# Patient Record
Sex: Female | Born: 1940 | Race: Black or African American | Hispanic: No | Smoking: Former smoker
Health system: Southern US, Community
[De-identification: ages and names within clinical notes are randomized; demographics above are authoritative.]

## PROBLEM LIST (undated history)

## (undated) DIAGNOSIS — J439 Emphysema, unspecified: Secondary | ICD-10-CM

## (undated) DIAGNOSIS — M1990 Unspecified osteoarthritis, unspecified site: Secondary | ICD-10-CM

## (undated) DIAGNOSIS — R351 Nocturia: Secondary | ICD-10-CM

## (undated) DIAGNOSIS — M810 Age-related osteoporosis without current pathological fracture: Secondary | ICD-10-CM

## (undated) DIAGNOSIS — I10 Essential (primary) hypertension: Secondary | ICD-10-CM

## (undated) DIAGNOSIS — D4959 Neoplasm of unspecified behavior of other genitourinary organ: Secondary | ICD-10-CM

## (undated) DIAGNOSIS — E119 Type 2 diabetes mellitus without complications: Secondary | ICD-10-CM

## (undated) DIAGNOSIS — E0590 Thyrotoxicosis, unspecified without thyrotoxic crisis or storm: Secondary | ICD-10-CM

## (undated) DIAGNOSIS — R0609 Other forms of dyspnea: Secondary | ICD-10-CM

## (undated) DIAGNOSIS — I639 Cerebral infarction, unspecified: Secondary | ICD-10-CM

## (undated) DIAGNOSIS — D494 Neoplasm of unspecified behavior of bladder: Secondary | ICD-10-CM

## (undated) DIAGNOSIS — I509 Heart failure, unspecified: Secondary | ICD-10-CM

## (undated) DIAGNOSIS — G4734 Idiopathic sleep related nonobstructive alveolar hypoventilation: Secondary | ICD-10-CM

## (undated) DIAGNOSIS — Z8709 Personal history of other diseases of the respiratory system: Secondary | ICD-10-CM

## (undated) DIAGNOSIS — C801 Malignant (primary) neoplasm, unspecified: Secondary | ICD-10-CM

## (undated) DIAGNOSIS — H269 Unspecified cataract: Secondary | ICD-10-CM

## (undated) HISTORY — DX: Malignant (primary) neoplasm, unspecified: C80.1

## (undated) HISTORY — DX: Cerebral infarction, unspecified: I63.9

## (undated) HISTORY — DX: Age-related osteoporosis without current pathological fracture: M81.0

## (undated) HISTORY — DX: Unspecified cataract: H26.9

---

## 1979-05-01 HISTORY — PX: ABDOMINAL HYSTERECTOMY: SHX81

## 2011-06-01 LAB — HM DEXA SCAN

## 2011-07-06 ENCOUNTER — Other Ambulatory Visit: Payer: Self-pay | Admitting: Family Medicine

## 2011-07-06 DIAGNOSIS — R921 Mammographic calcification found on diagnostic imaging of breast: Secondary | ICD-10-CM

## 2011-07-29 ENCOUNTER — Ambulatory Visit
Admission: RE | Admit: 2011-07-29 | Discharge: 2011-07-29 | Disposition: A | Discharge status: Home / Self Care | Payer: No Typology Code available for payment source | Source: Ambulatory Visit | Attending: Family Medicine | Admitting: Family Medicine

## 2011-07-29 DIAGNOSIS — R921 Mammographic calcification found on diagnostic imaging of breast: Secondary | ICD-10-CM

## 2012-01-04 ENCOUNTER — Other Ambulatory Visit: Payer: Self-pay | Admitting: Family Medicine

## 2012-01-04 DIAGNOSIS — R921 Mammographic calcification found on diagnostic imaging of breast: Secondary | ICD-10-CM

## 2012-02-09 ENCOUNTER — Ambulatory Visit
Admission: RE | Admit: 2012-02-09 | Discharge: 2012-02-09 | Disposition: A | Discharge status: Home / Self Care | Payer: No Typology Code available for payment source | Source: Ambulatory Visit | Attending: Family Medicine | Admitting: Family Medicine

## 2012-02-09 DIAGNOSIS — R921 Mammographic calcification found on diagnostic imaging of breast: Secondary | ICD-10-CM

## 2012-02-23 ENCOUNTER — Encounter (HOSPITAL_COMMUNITY): Payer: Self-pay | Admitting: Emergency Medicine

## 2012-02-23 ENCOUNTER — Emergency Department (HOSPITAL_COMMUNITY): Payer: Medicare Other

## 2012-02-23 ENCOUNTER — Inpatient Hospital Stay (HOSPITAL_COMMUNITY)
Admission: EM | Admit: 2012-02-23 | Discharge: 2012-02-24 | DRG: 699 | Disposition: A | Discharge status: Home Health | Payer: Medicare Other | Attending: Internal Medicine | Admitting: Internal Medicine

## 2012-02-23 DIAGNOSIS — N3289 Other specified disorders of bladder: Secondary | ICD-10-CM

## 2012-02-23 DIAGNOSIS — D62 Acute posthemorrhagic anemia: Secondary | ICD-10-CM

## 2012-02-23 DIAGNOSIS — R310 Gross hematuria: Secondary | ICD-10-CM | POA: Diagnosis present

## 2012-02-23 DIAGNOSIS — R319 Hematuria, unspecified: Secondary | ICD-10-CM

## 2012-02-23 DIAGNOSIS — I951 Orthostatic hypotension: Secondary | ICD-10-CM

## 2012-02-23 DIAGNOSIS — D649 Anemia, unspecified: Secondary | ICD-10-CM

## 2012-02-23 DIAGNOSIS — N329 Bladder disorder, unspecified: Principal | ICD-10-CM | POA: Diagnosis present

## 2012-02-23 DIAGNOSIS — E0590 Thyrotoxicosis, unspecified without thyrotoxic crisis or storm: Secondary | ICD-10-CM

## 2012-02-23 HISTORY — DX: Essential (primary) hypertension: I10

## 2012-02-23 LAB — POCT I-STAT, CHEM 8
Creatinine, Ser: 0.9 mg/dL (ref 0.50–1.10)
HCT: 25 % — ABNORMAL LOW (ref 36.0–46.0)
Hemoglobin: 8.5 g/dL — ABNORMAL LOW (ref 12.0–15.0)
Potassium: 3.7 mEq/L (ref 3.5–5.1)
Sodium: 138 mEq/L (ref 135–145)
TCO2: 26 mmol/L (ref 0–100)

## 2012-02-23 LAB — CBC WITH DIFFERENTIAL/PLATELET
Basophils Absolute: 0 10*3/uL (ref 0.0–0.1)
Basophils Relative: 0 % (ref 0–1)
HCT: 24.8 % — ABNORMAL LOW (ref 36.0–46.0)
Hemoglobin: 7.8 g/dL — ABNORMAL LOW (ref 12.0–15.0)
Lymphocytes Relative: 16 % (ref 12–46)
MCHC: 31.5 g/dL (ref 30.0–36.0)
Monocytes Absolute: 0.9 10*3/uL (ref 0.1–1.0)
Monocytes Relative: 12 % (ref 3–12)
Neutro Abs: 5.7 10*3/uL (ref 1.7–7.7)
Neutrophils Relative %: 72 % (ref 43–77)
RDW: 15.6 % — ABNORMAL HIGH (ref 11.5–15.5)
WBC: 8 10*3/uL (ref 4.0–10.5)

## 2012-02-23 LAB — URINALYSIS, ROUTINE W REFLEX MICROSCOPIC
Bilirubin Urine: NEGATIVE
Nitrite: NEGATIVE
Urobilinogen, UA: 1 mg/dL (ref 0.0–1.0)

## 2012-02-23 LAB — RETICULOCYTES: RBC.: 2.86 MIL/uL — ABNORMAL LOW (ref 3.87–5.11)

## 2012-02-23 LAB — PROCALCITONIN: Procalcitonin: <0.1 ng/mL

## 2012-02-23 LAB — URINE MICROSCOPIC-ADD ON

## 2012-02-23 LAB — ABO/RH: ABO/RH(D): A NEG

## 2012-02-23 MED ORDER — DOCUSATE SODIUM 100 MG PO CAPS
100.0000 mg | ORAL_CAPSULE | Freq: Two times a day (BID) | ORAL | Status: DC
  Administered 2012-02-24 (×2): 100 mg via ORAL
  Filled 2012-02-23 (×3): qty 1

## 2012-02-23 MED ORDER — SODIUM CHLORIDE 0.9 % IV BOLUS (SEPSIS)
250.0000 mL | Freq: Once | INTRAVENOUS | Status: AC
  Administered 2012-02-23: 250 mL via INTRAVENOUS

## 2012-02-23 MED ORDER — DEXTROSE 5 % IV SOLN
1.0000 g | Freq: Once | INTRAVENOUS | Status: AC
  Administered 2012-02-23: 1 g via INTRAVENOUS
  Filled 2012-02-23: qty 10

## 2012-02-23 MED ORDER — POLYETHYLENE GLYCOL 3350 17 G PO PACK
17.0000 g | PACK | Freq: Every day | ORAL | Status: DC | PRN
  Filled 2012-02-23: qty 1

## 2012-02-23 MED ORDER — ACETAMINOPHEN 650 MG RE SUPP: 650.0000 mg | Freq: Four times a day (QID) | RECTAL | Status: DC | PRN

## 2012-02-23 MED ORDER — ONDANSETRON HCL 4 MG/2ML IJ SOLN: 4.0000 mg | Freq: Four times a day (QID) | INTRAMUSCULAR | Status: DC | PRN

## 2012-02-23 MED ORDER — SODIUM CHLORIDE 0.9 % IV SOLN
INTRAVENOUS | Status: DC
  Administered 2012-02-23: 19:00:00 via INTRAVENOUS
  Administered 2012-02-23: 1000 mL via INTRAVENOUS

## 2012-02-23 MED ORDER — ACETAMINOPHEN 325 MG PO TABS: 650.0000 mg | ORAL_TABLET | Freq: Four times a day (QID) | ORAL | Status: DC | PRN

## 2012-02-23 MED ORDER — METHIMAZOLE 10 MG PO TABS
10.0000 mg | ORAL_TABLET | Freq: Three times a day (TID) | ORAL | Status: DC
  Administered 2012-02-24 (×3): 10 mg via ORAL
  Filled 2012-02-23 (×4): qty 1

## 2012-02-23 MED ORDER — ONDANSETRON HCL 4 MG PO TABS: 4.0000 mg | ORAL_TABLET | Freq: Four times a day (QID) | ORAL | Status: DC | PRN

## 2012-02-23 NOTE — ED Notes (Signed)
Attempted to call report to 3 E. RN unable to take report at this time. Will call back.  

## 2012-02-23 NOTE — ED Notes (Signed)
AVW:[id number]> Expected date:<BR> Expected time:<BR> Means of arrival:<BR> Comments:<BR> RC ems- flank pain

## 2012-02-23 NOTE — ED Notes (Signed)
Lab tech at bedside for additional blood draw.  

## 2012-02-23 NOTE — ED Provider Notes (Signed)
History     CSN: [id number]  Arrival date & time 02/23/12  1725   First MD Initiated Contact with Patient 02/23/12 1749      Chief Complaint  Patient presents with  . Flank Pain    HPI Pt was seen at 1820.  Per pt, c/o gradual onset and persistence of constant hematuria for the past 1 to 2 weeks.  Pt states she was eval at OSH 5 days ago for hematuria and urinary retention, had a foley placed, dx UTI, rx macrobid and referred to Uro MD in [place].  States she cannot get an appt soon and her symptoms continue.  Have now been associated with "lightheadedness" when standing and left sided LBP.  Denies fevers, no N/V/D, no CP/SOB, no abd pain. Denies incont of bowel, no saddle anesthesia, no focal motor weakness, no tingling/numbness in extremities.     PMD:  Ignacia Bayley FP Past Medical History  Diagnosis Date  . Hypertension   . Thyroid disease     No past surgical history on file.   History  Substance Use Topics  . Smoking status: Not on file  . Smokeless tobacco: Not on file  . Alcohol Use:     Review of Systems ROS: Statement: All systems negative except as marked or noted in the HPI; Constitutional: Negative for fever and chills. ; ; Eyes: Negative for eye pain, redness and discharge. ; ; ENMT: Negative for ear pain, hoarseness, nasal congestion, sinus pressure and sore throat. ; ; Cardiovascular: Negative for chest pain, palpitations, diaphoresis, dyspnea and peripheral edema. ; ; Respiratory: Negative for cough, wheezing and stridor. ; ; Gastrointestinal: Negative for nausea, vomiting, diarrhea, abdominal pain, blood in stool, hematemesis, jaundice and rectal bleeding. . ; ; Genitourinary: +hematuria, urinary retention. Negative for dysuria, flank pain and hematuria. ; ; Musculoskeletal: +LBP. Negative for neck pain. Negative for swelling and trauma.; ; Skin: Negative for pruritus, rash, abrasions, blisters, bruising and skin lesion.; ; Neuro: +lightheadedness.  Negative for headache and neck stiffness. Negative for weakness, altered level of consciousness , altered mental status, extremity weakness, paresthesias, involuntary movement, seizure and syncope.      Allergies  Review of patient's allergies indicates no known allergies.  Home Medications   Current Outpatient Rx  Name Route Sig Dispense Refill  . LOSARTAN POTASSIUM-HCTZ 100-25 MG PO TABS Oral Take 1 tablet by mouth daily.    [name] METHIMAZOLE 10 MG PO TABS Oral Take 10 mg by mouth 3 (three) times daily.    [name] NITROFURANTOIN MONOHYD MACRO 100 MG PO CAPS Oral Take 100 mg by mouth 2 (two) times daily.    [name] PHENAZOPYRIDINE HCL 100 MG PO TABS Oral Take 100 mg by mouth 3 (three) times daily as needed. For pain      BP 129/61  Pulse 85  Temp 98.5 F (36.9 C) (Oral)  Resp 20  SpO2 100%  Physical Exam 1825: Physical examination:  Nursing notes reviewed; Vital signs and O2 SAT reviewed;  Constitutional: Well developed, Well nourished, Well hydrated, In no acute distress; Head:  Normocephalic, atraumatic; Eyes: EOMI, PERRL, No scleral icterus, conjunctiva pale; ENMT: Mouth and pharynx normal, Mucous membranes moist; Neck: Supple, Full range of motion, No lymphadenopathy; Cardiovascular: Regular rate and rhythm, No murmur, rub, or gallop; Respiratory: Breath sounds clear & equal bilaterally, No rales, rhonchi, wheezes.  Speaking full sentences with ease, Normal respiratory effort/excursion; Chest: Nontender, Movement normal; Abdomen: Soft, Nontender, Nondistended, Normal bowel sounds; Genitourinary: No CVA tenderness, +foley catheter draining  hematuria; Spine:  No midline CS, TS, LS tenderness.  +TTP left lower lumbar paraspinal muscles.;; Extremities: Pulses normal, No tenderness, No edema, No calf edema or asymmetry.; Neuro: AA&Ox3, Major CN grossly intact.  Speech clear. No gross focal motor or sensory deficits in extremities.; Skin: Color normal, Warm, Dry.   ED Course  Procedures    MDM   MDM Reviewed: nursing note and vitals Interpretation: labs and CT scan   Results for orders placed during the hospital encounter of 02/23/12  URINALYSIS, ROUTINE W REFLEX MICROSCOPIC      Component Value Range   Color, Urine RED (*) YELLOW   APPearance TURBID (*) CLEAR   Specific Gravity, Urine 1.025  1.005 - 1.030   pH 6.5  5.0 - 8.0   Glucose, UA NEGATIVE  NEGATIVE mg/dL   Hgb urine dipstick LARGE (*) NEGATIVE   Bilirubin Urine NEGATIVE  NEGATIVE   Ketones, ur TRACE (*) NEGATIVE mg/dL   Protein, ur >454 (*) NEGATIVE mg/dL   Urobilinogen, UA 1.0  0.0 - 1.0 mg/dL   Nitrite NEGATIVE  NEGATIVE   Leukocytes, UA MODERATE (*) NEGATIVE  CBC WITH DIFFERENTIAL      Component Value Range   WBC 8.0  4.0 - 10.5 K/uL   RBC 2.90 (*) 3.87 - 5.11 MIL/uL   Hemoglobin 7.8 (*) 12.0 - 15.0 g/dL   HCT 09.8 (*) 11.9 - 14.7 %   MCV 85.5  78.0 - 100.0 fL   MCH 26.9  26.0 - 34.0 pg   MCHC 31.5  30.0 - 36.0 g/dL   RDW 82.9 (*) 56.2 - 13.0 %   Platelets 189  150 - 400 K/uL   Neutrophils Relative 72  43 - 77 %   Neutro Abs 5.7  1.7 - 7.7 K/uL   Lymphocytes Relative 16  12 - 46 %   Lymphs Abs 1.3  0.7 - 4.0 K/uL   Monocytes Relative 12  3 - 12 %   Monocytes Absolute 0.9  0.1 - 1.0 K/uL   Eosinophils Relative 1  0 - 5 %   Eosinophils Absolute 0.1  0.0 - 0.7 K/uL   Basophils Relative 0  0 - 1 %   Basophils Absolute 0.0  0.0 - 0.1 K/uL  LACTIC ACID, PLASMA      Component Value Range   Lactic Acid, Venous 0.9  0.5 - 2.2 mmol/L  POCT I-STAT, CHEM 8      Component Value Range   Sodium 138  135 - 145 mEq/L   Potassium 3.7  3.5 - 5.1 mEq/L   Chloride 100  96 - 112 mEq/L   BUN 15  6 - 23 mg/dL   Creatinine, Ser 8.65  0.50 - 1.10 mg/dL   Glucose, Bld 784 (*) 70 - 99 mg/dL   Calcium, Ion 6.96  2.95 - 1.32 mmol/L   TCO2 26  0 - 100 mmol/L   Hemoglobin 8.5 (*) 12.0 - 15.0 g/dL   HCT 28.4 (*) 13.2 - 44.0 %  URINE MICROSCOPIC-ADD ON      Component Value Range   WBC, UA 7-10  <3 WBC/hpf   RBC / HPF  21-50  <3 RBC/hpf   Bacteria, UA MANY (*) RARE   Urine-Other RARE YEAST     Ct Abdomen Pelvis Wo Contrast 02/23/2012  *RADIOLOGY REPORT*  Clinical Data: Hematuria  CT ABDOMEN AND PELVIS WITHOUT CONTRAST  Technique:  Multidetector CT imaging of the abdomen and pelvis was performed following the standard protocol without intravenous  contrast.  Comparison: None.  Findings: Foley catheter is in place within the base of the bladder.  Large heterogeneous and hyperdense mass is seen filling much of the bladder measuring 7.1 x 7.0 x 6.8 cm.  No evidence of hydronephrosis.  No urinary calculus.  Hyperdense sludge or layering gallstones in the gallbladder.  Liver, spleen, pancreas, adrenal glands within normal limits. Degenerative changes in the lumbar spine.  No obvious abnormal adenopathy.  No free fluid.  IMPRESSION: Large mass in the bladder as described.  This may simply represent a large hematoma.  Underlying urothelial mass cannot be excluded.  Gallstones versus gallbladder sludge.  No urinary calculus or hydronephrosis.  Original Report Authenticated By: Donavan Burnet, M.D.     8:18 PM:  Pt orthostatic from lay to sit (SBP drop 120's to 80's), c/o "being dizzy" to ED Tech.  Stable VS while sitting on stretcher, resps easy, mentating per her baseline per family.  Denies CP/SOB.  Dx testing d/w pt and family.  Questions answered.  Verb understanding, agreeable to admit. T/C to [facility] Dr. Ashley Royalty, case discussed, including:  HPI, pertinent PM/SHx, VS/PE, dx testing, ED course and treatment:  Agreeable to admit, requests to obtain anemia panel, retic count, transfuse 1 unit PRBC, consult Uro MD, obtain medical bed to team 1.  8:25 PM:  T/C to Uro Dr. Sherron Monday, case discussed, including:  HPI, pertinent PM/SHx, VS/PE, dx testing, ED course and treatment:  Agreeable to consult in the morning.    Laray Anger, DO 02/24/12 1320

## 2012-02-23 NOTE — ED Notes (Signed)
MD at bedside. Dr. Ashley Royalty, hospitalist, at bedside.

## 2012-02-23 NOTE — ED Notes (Addendum)
Irrigated foley with sterile water. Bloody urine with clots returned. Unable to irrigate until clear.Pus drainage noted around meatus. Dr. Clarene Duke notified. No further orders received.

## 2012-02-23 NOTE — ED Notes (Signed)
Patient transported to CT 

## 2012-02-23 NOTE — ED Notes (Signed)
Per EMS, went to [facility] yesterday for UTI, had foley placed-states she has emptied foley 3 times, bright red blood mixed with urine

## 2012-02-23 NOTE — H&P (Signed)
Hospital Admission Note Date: 02/23/2012  Patient name: Sonya Oliver Medical record number: [id number] Date of birth: [birth year] Age: 71 y.o. Gender: female PCP: [name], MD  Attending physician: Altha Harm, MD  Chief Complaint: Hematuria for 6 days.  History of Present Illness: Sonya Oliver is a very pleasant 71 year old female who 6 days ago presented to [facility] as she was having some gross hematuria. She had a Foley catheter placed temporarily while in the emergency room and was sent home. The patient states that she continued to have hematuria and went back to the emergency room on Monday-2 days ago and had a Foley placed indwelling and was sent home with it. At that time she was also treated for a urinary tract infection with Macrodantin. The patient states that she's continued to have hematuria. Today she felt very fatigued and lightheaded with continued hematuria. Her daughter felt that she needs to be evaluated urgently at the presented to the emergency room.  In the emergency room the patient was found to have continued gross hematuria with a hemoglobin of 7.8. The patient was also significantly hypotensive with a systolic blood pressure in the 80s on arrival. The patient's only symptoms were dizziness. She denies any chest pain any nausea vomiting or diarrhea. Her most compelling symptom is of gross hematuria.  Since being in the emergency room the patient does receive some IV fluids as ordered for 1 unit of blood and expiration that she will also have a dilutional anemia which will likely bring her hemoglobin down below 7.  Scheduled Meds:   . cefTRIAXone (ROCEPHIN)  IV  1 g Intravenous Once  . sodium chloride  250 mL Intravenous Once   Continuous Infusions:   . sodium chloride 75 mL/hr at 02/23/12 1909   PRN Meds:. Allergies: Review of patient's allergies indicates no known allergies. Past Medical History  Diagnosis Date  . Hypertension   .  Thyroid disease   . Diabetes mellitus     borderline diabetes   Past Surgical History  Procedure Date  . Abdominal hysterectomy    History reviewed. No pertinent family history. History   Social History  . Marital Status: Widowed    Spouse Name: N/A    Number of Children: N/A  . Years of Education: N/A   Occupational History  . Not on file.   Social History Main Topics  . Smoking status: Former Smoker -- 50 years    Quit date: 02/22/2010  . Smokeless tobacco: Never Used  . Alcohol Use: No  . Drug Use: No  . Sexually Active:    Other Topics Concern  . Not on file   Social History Narrative  . No narrative on file   Review of Systems: A comprehensive review of systems was negative. Physical Exam:  Intake/Output Summary (Last 24 hours) at 02/23/12 2138 Last data filed at 02/23/12 1746  Gross per 24 hour  Intake      0 ml  Output    150 ml  Net   -150 ml   General: Alert, awake, oriented x3, in no acute distress.  HEENT: [place]/AT PEERL, EOMI Neck: Trachea midline,  no masses, no thyromegal,y no JVD, no carotid bruit OROPHARYNX:  Moist, No exudate/ erythema/lesions.  Heart: Regular rate and rhythm, without murmurs, rubs, gallops, PMI non-displaced, no heaves or thrills on palpation.  Lungs: Clear to auscultation, no wheezing or rhonchi noted. No increased vocal fremitus resonant to percussion  Abdomen: Soft, nontender, nondistended, positive bowel sounds,  no masses no hepatosplenomegaly noted..  Neuro: No focal neurological deficits noted cranial nerves II through XII grossly intact. DTRs 2+ bilaterally upper and lower extremities. Strength 5 out of 5 in bilateral upper and lower extremities. Musculoskeletal: No warm swelling or erythema around joints, no spinal tenderness noted. Psychiatric: Patient alert and oriented x3, good insight and cognition, good recent to remote recall. Lymph node survey: No cervical axillary or inguinal lymphadenopathy noted.  Lab  results:  [facility] 02/23/12 1832  NA 138  K 3.7  CL 100  CO2 --  GLUCOSE 132*  BUN 15  CREATININE 0.90  CALCIUM --  MG --  PHOS --   No results found for this basename: AST:2,ALT:2,ALKPHOS:2,BILITOT:2,PROT:2,ALBUMIN:2 in the last 72 hours No results found for this basename: LIPASE:2,AMYLASE:2 in the last 72 hours  Basename 02/23/12 1909 02/23/12 1832  WBC 8.0 --  NEUTROABS 5.7 --  HGB 7.8* 8.5*  HCT 24.8* 25.0*  MCV 85.5 --  PLT 189 --   No results found for this basename: CKTOTAL:3,CKMB:3,CKMBINDEX:3,TROPONINI:3 in the last 72 hours No components found with this basename: POCBNP:3 No results found for this basename: DDIMER:2 in the last 72 hours No results found for this basename: HGBA1C:2 in the last 72 hours No results found for this basename: CHOL:2,HDL:2,LDLCALC:2,TRIG:2,CHOLHDL:2,LDLDIRECT:2 in the last 72 hours No results found for this basename: TSH,T4TOTAL,FREET3,T3FREE,THYROIDAB in the last 72 hours  Basename 02/23/12 2018  VITAMINB12 --  FOLATE --  FERRITIN --  TIBC --  IRON --  RETICCTPCT 4.4*   Imaging results:  Ct Abdomen Pelvis Wo Contrast  02/23/2012  *RADIOLOGY REPORT*  Clinical Data: Hematuria  CT ABDOMEN AND PELVIS WITHOUT CONTRAST  Technique:  Multidetector CT imaging of the abdomen and pelvis was performed following the standard protocol without intravenous contrast.  Comparison: None.  Findings: Foley catheter is in place within the base of the bladder.  Large heterogeneous and hyperdense mass is seen filling much of the bladder measuring 7.1 x 7.0 x 6.8 cm.  No evidence of hydronephrosis.  No urinary calculus.  Hyperdense sludge or layering gallstones in the gallbladder.  Liver, spleen, pancreas, adrenal glands within normal limits. Degenerative changes in the lumbar spine.  No obvious abnormal adenopathy.  No free fluid.  IMPRESSION: Large mass in the bladder as described.  This may simply represent a large hematoma.  Underlying urothelial mass cannot  be excluded.  Gallstones versus gallbladder sludge.  No urinary calculus or hydronephrosis.  Original Report Authenticated By: Donavan Burnet, M.D.   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  DIGITAL DIAGNOSTIC RIGHT MAMMOGRAM WITH CAD  Comparison:  07/29/2011 and prior mammograms dating back to 07/06/2011.  Findings:  Magnification and routine views of the right breast again demonstrate scattered fibroglandular densities. A biopsy clip within the upper outer right breast is again identified with a few adjacent calcifications which are unchanged. No new or suspicious calcifications are identified. There is no evidence of mass or nonsurgical distortion noted. Mammographic images were processed with CAD.  IMPRESSION: Stable benign calcifications in the upper outer right breast.  No mammographic evidence of right breast malignancy.  These findings were discussed with the patient and her questions answered.  She was encouraged to begin/continue monthly self exams and to contact her primary physician if any changes noted.  BI-RADS CATEGORY 2:  Benign finding(s).  Recommend bilateral screening mammograms in November 2013  to resume annual schedule.  Original Report Authenticated By: Rosendo Gros, M.D.   Other results:    Patient Active Hospital Problem List: Hematuria, gross (02/23/2012)   Assessment: The patient has gross hematuria with a bladder mass. It is like that the patient may have a malignancy. The patient will likely need a further study including maybe an ultrasound of the bladder however I will defer to urology. Most assuredly the patient to me the urological consult and will likely need a cystoscopic examination. For now I'll keep the Foley in place and the hold on any further antibiotics.   Anemia associated with acute blood loss (02/23/2012)    Assessment: Presently the patient's hemoglobin at 7.8, however I anticipate that it will decrease even further with IV fluids. The patient has been ordered 1 unit of blood in the emergency room and will receive that. I will also check serum iron and reticulocyte count this patient.    Orthostatic hypotension (02/23/2012)   Assessment: This is already started to improve in the emergency room after IV fluids. We'll continue fluid resuscitation and monitor the patient's blood pressures.    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 Alwyn Pea, she has consulted with urology the emergency room and we will await their evaluation.     DVT prophylaxis with SCDs secondary to an active bleed. Nataya Bastedo A. 02/23/2012, 9:38 PM

## 2012-02-24 DIAGNOSIS — I951 Orthostatic hypotension: Secondary | ICD-10-CM

## 2012-02-24 DIAGNOSIS — D62 Acute posthemorrhagic anemia: Secondary | ICD-10-CM

## 2012-02-24 DIAGNOSIS — N3289 Other specified disorders of bladder: Secondary | ICD-10-CM

## 2012-02-24 DIAGNOSIS — R310 Gross hematuria: Secondary | ICD-10-CM

## 2012-02-24 LAB — IRON AND TIBC
Iron: <10 ug/dL — ABNORMAL LOW (ref 42–135)
UIBC: 290 ug/dL (ref 125–400)

## 2012-02-24 LAB — HEMOGLOBIN: Hemoglobin: 7.7 g/dL — ABNORMAL LOW (ref 12.0–15.0)

## 2012-02-24 LAB — FOLATE: Folate: >20 ng/mL

## 2012-02-24 LAB — VITAMIN B12: Vitamin B-12: 589 pg/mL (ref 211–911)

## 2012-02-24 NOTE — Progress Notes (Signed)
[facility] PROGRESS NOTE  JAKYIAH BRIONES ZOX:[id number] DOB: 1940-10-26 DOA: 02/23/2012 PCP: [name], MD  Assessment/Plan: 1. ABLA- transfused 1 unit last PM and will get another this AM- check h/h after 2. Bladder mass- plan to follow up with urology next week- need cysto and biopsy 3. Hematuria- secondary to mass 4. Hypotension- resolved with IVF and blood administration 5. ? UTI- await culture, hold abx- recently treated, no WBCs 6. Plan to d/c with foley  Code Status: full Family Communication: updated family at bedside Disposition Plan: home this PM or in AM if blood stable  Marlin Canary, D.O.  [facility] Pager [phone]  02/24/2012, 9:52 AM  LOS: 1 day    Consultants:  urology   Subjective: Still fatigued and dizzy   Objective: Filed Vitals:   02/24/12 0110 02/24/12 0210 02/24/12 0320 02/24/12 0525  BP: 102/62 97/59 96/53  144/68  Pulse: 70 75 87 77  Temp: 99.2 F (37.3 C) 99.3 F (37.4 C) 98.5 F (36.9 C) 98.1 F (36.7 C)  TempSrc: Oral Oral Oral Oral  Resp: 16 16 16 18   Height:      Weight:      SpO2: 99% 90% 91% 98%    Intake/Output Summary (Last 24 hours) at 02/24/12 0952 Last data filed at 02/24/12 0630  Gross per 24 hour  Intake  982.5 ml  Output    585 ml  Net  397.5 ml    Exam:  General: Alert, awake, oriented x3, in no acute distress.  HEENT: [place]/AT PEERL, EOMI  Heart: Regular rate and rhythm, without murmurs, rubs, gallops, .  Lungs: Clear to auscultation, no wheezing or rhonchi noted. No increased vocal fremitus resonant to percussion  Abdomen: Soft, nontender, nondistended, positive bowel sounds, no masses no hepatosplenomegaly noted..  Neuro: No focal neurological deficits noted cranial nerves II through XII grossly intact. DTRs 2+ bilaterally upper and lower extremities. Strength 5 out of 5 in bilateral upper and lower extremities.  Musculoskeletal: No warm swelling or erythema around  joints, no spinal tenderness noted.  Psychiatric: Patient alert and oriented x3, good insight and cognition, good recent to remote recall.  Lymph node survey: No cervical axillary or inguinal lymphadenopathy noted. Foley in    Data Reviewed: Basic Metabolic Panel:  Lab 02/23/12 8295  NA 138  K 3.7  CL 100  CO2 --  GLUCOSE 132*  BUN 15  CREATININE 0.90  CALCIUM --  MG --  PHOS --   Liver Function Tests: No results found for this basename: AST:5,ALT:5,ALKPHOS:5,BILITOT:5,PROT:5,ALBUMIN:5 in the last 168 hours No results found for this basename: LIPASE:5,AMYLASE:5 in the last 168 hours No results found for this basename: AMMONIA:5 in the last 168 hours CBC:  Lab 02/24/12 0634 02/23/12 1909 02/23/12 1832  WBC -- 8.0 --  NEUTROABS -- 5.7 --  HGB 7.7* 7.8* 8.5*  HCT 24.1* 24.8* 25.0*  MCV -- 85.5 --  PLT -- 189 --   Cardiac Enzymes: No results found for this basename: CKTOTAL:5,CKMB:5,CKMBINDEX:5,TROPONINI:5 in the last 168 hours BNP: No components found with this basename: POCBNP:5 CBG: No results found for this basename: GLUCAP:5 in the last 168 hours  No results found for this or any previous visit (from the past 240 hour(s)).   Studies: Ct Abdomen Pelvis Wo Contrast  02/23/2012  *RADIOLOGY REPORT*  Clinical Data: Hematuria  CT ABDOMEN AND PELVIS WITHOUT CONTRAST  Technique:  Multidetector CT imaging of the abdomen and pelvis was performed following the standard protocol without intravenous contrast.  Comparison: None.  Findings: Foley catheter is in place within the base of the bladder.  Large heterogeneous and hyperdense mass is seen filling much of the bladder measuring 7.1 x 7.0 x 6.8 cm.  No evidence of hydronephrosis.  No urinary calculus.  Hyperdense sludge or layering gallstones in the gallbladder.  Liver, spleen, pancreas, adrenal glands within normal limits. Degenerative changes in the lumbar spine.  No obvious abnormal adenopathy.  No free fluid.  IMPRESSION:  Large mass in the bladder as described.  This may simply represent a large hematoma.  Underlying urothelial mass cannot be excluded.  Gallstones versus gallbladder sludge.  No urinary calculus or hydronephrosis.  Original Report Authenticated By: Donavan Burnet, M.D.   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  DIGITAL DIAGNOSTIC RIGHT MAMMOGRAM WITH CAD  Comparison:  07/29/2011 and prior mammograms dating back to 07/06/2011.  Findings:  Magnification and routine views of the right breast again demonstrate scattered fibroglandular densities. A biopsy clip within the upper outer right breast is again identified with a few adjacent calcifications which are unchanged. No new or suspicious calcifications are identified. There is no evidence of mass or nonsurgical distortion noted. Mammographic images were processed with CAD.  IMPRESSION: Stable benign calcifications in the upper outer right breast.  No mammographic evidence of right breast malignancy.  These findings were discussed with the patient and her questions answered.  She was encouraged to begin/continue monthly self exams and to contact her primary physician if any changes noted.  BI-RADS CATEGORY 2:  Benign finding(s).  Recommend bilateral screening mammograms in November 2013 to resume annual schedule.  Original Report Authenticated By: Rosendo Gros, M.D.    Scheduled Meds:   . cefTRIAXone (ROCEPHIN)  IV  1 g Intravenous Once  . docusate sodium  100 mg Oral BID  . methimazole  10 mg Oral TID  . sodium chloride  250 mL Intravenous Once   Continuous Infusions:   . sodium chloride 1,000 mL (02/23/12 2255)

## 2012-02-24 NOTE — Progress Notes (Signed)
UR done. 

## 2012-02-24 NOTE — Progress Notes (Signed)
I have spoken with Dr Vernie Ammons and i will arrange f/up next week with him

## 2012-02-24 NOTE — Discharge Instructions (Signed)
Foley Catheter Care, Adult  A soft, flexible tube (Foley catheter) has been placed in your bladder. This may be done to temporarily help with urine drainage after an operation or to relieve blockage from an enlarged prostate gland.  HOME CARE INSTRUCTIONS   If you are going home with a Foley catheter in place, follow these instructions:  Taking Care of the Catheter:   Keep the area where the catheter leaves your body clean.   Attach the catheter to the leg so there is no tension on the catheter.   Keep the drainage bag below the level of the bladder, but keep it OFF the floor.   Do not take long soaking baths. Your caregiver will give instructions about showering.   Wash your hands before touching ANYTHING related to the catheter or bag.   Using mild soap and warm water on a washcloth:   Clean the area closest to the catheter insertion site using a circular motion around the catheter.   Clean the catheter itself by wiping AWAY from the insertion site for several inches down the tube.   NEVER wipe upward as this could sweep bacteria up into the urethra (tube in your body that normally drains the bladder) and cause infection.  Taking Care of the Drainage Bags:   Two drainage bags will be taken home: a large overnight drainage bag, and a smaller leg bag which fits underneath clothing.   It is okay to wear the overnight bag at any time, but NEVER wear the smaller leg bag at night.   Keep the drainage bag well below the level of your bladder. This prevents backflow of urine into the bladder and allows the urine to drain freely.   Anchor the tubing to your leg to prevent pulling or tension on the catheter. Use tape or a leg strap provided by the hospital.   Empty the drainage bag when it is  to  full. Wash your hands before and after touching the bag.   Periodically check the tubing for kinks to make sure there is no pressure on the tubing which could restrict the flow of urine.  Changing the Drainage  Bags:   Cleanse both ends of the clean bag with alcohol before changing.   Pinch off the rubber catheter to avoid urine spillage during the disconnection.   Disconnect the dirty bag and connect the clean one.   Empty the dirty bag carefully to avoid a urine spill.   Attach the new bag to the leg with tape or a leg strap.  Cleaning the Drainage Bags:   Whenever a drainage bag is disconnected, it must be cleaned quickly so it is ready for the next use.   Wash the bag in warm, soapy water.   Rinse the bag thoroughly with warm water.   Soak the bag for 30 minutes in a solution of white vinegar and water (1 cup vinegar to 1 quart warm water).   Rinse with warm water.  SEEK MEDICAL CARE IF:    Some pain develops in the kidney (lower back) area.   The urine is cloudy or smells bad.   There is some blood in the urine.   The catheter becomes clogged and/or there is no urine drainage.  SEEK IMMEDIATE MEDICAL CARE IF:    You have moderate or severe pain in the kidney region.   You start to throw up (vomit).   Blood fills the tube.   Worsening belly (abdominal) pain develops.     You have a fever.  MAKE SURE YOU:    Understand these instructions.   Will watch your condition.   Will get help right away if you are not doing well or get worse.  Document Released: 08/16/2005 Document Revised: 08/05/2011 Document Reviewed: 02/10/2007  ExitCare Patient Information 2012 [employer].

## 2012-02-24 NOTE — Discharge Summary (Signed)
Discharge Summary  Sonya Oliver MR#: [id number]  DOB:10-22-1940  Date of Admission: 02/23/2012 Date of Discharge: 02/24/2012  Patient's PCP: [name], MD  Attending Physician:Daysi Boggan  Consults:   urology  Discharge Diagnoses: Active Problems:  Hematuria, gross  Anemia associated with acute blood loss  Orthostatic hypotension  Bladder mass   Brief Admitting History and Physical Ms. Sonya Oliver is a very pleasant 71 year old female who 6 days ago presented to [facility] as she was having some gross hematuria. She had a Foley catheter placed temporarily while in the emergency room and was sent home. The patient states that she continued to have hematuria and went back to the emergency room on Monday-2 days ago and had a Foley placed indwelling and was sent home with it. At that time she was also treated for a urinary tract infection with Macrodantin. The patient states that she's continued to have hematuria. Today she felt very fatigued and lightheaded with continued hematuria. Her daughter felt that she needs to be evaluated urgently at the presented to the emergency room.  In the emergency room the patient was found to have continued gross hematuria with a hemoglobin of 7.8. The patient was also significantly hypotensive with a systolic blood pressure in the 80s on arrival. The patient's only symptoms were dizziness. She denies any chest pain any nausea vomiting or diarrhea. Her most compelling symptom is of gross hematuria.  Since being in the emergency room the patient does receive some IV fluids as ordered for 1 unit of blood and expiration that she will also have a dilutional anemia which will likely bring her hemoglobin down below 7.   Discharge Medications Medication List  As of 02/24/2012  5:22 PM   STOP taking these medications         nitrofurantoin (macrocrystal-monohydrate) 100 MG capsule         TAKE these medications        losartan-hydrochlorothiazide 100-25 MG per tablet   Commonly known as: HYZAAR   Take 1 tablet by mouth daily.      methimazole 10 MG tablet   Commonly known as: TAPAZOLE   Take 10 mg by mouth 3 (three) times daily.      phenazopyridine 100 MG tablet   Commonly known as: PYRIDIUM   Take 100 mg by mouth 3 (three) times daily as needed. For pain            Hospital Course: 1. ABLA- transfused 1 unit last PM and will get another this AM- check h/h after 2. Bladder mass- plan to follow up with urology next week- need cysto and biopsy 3. Hematuria- secondary to mass 4. Hypotension- resolved with IVF and blood administration 5. ? UTI- t culture no growth, hold abx- recently treated, no WBCs 6. Plan to d/c with foley   Day of Discharge BP 114/74  Pulse 77  Temp 98.3 F (36.8 C) (Oral)  Resp 18  Ht 5\' 6"  (1.676 m)  Wt 94.7 kg (208 lb 12.4 oz)  BMI 33.70 kg/m2  SpO2 91% See progress note from 6/27  Results for orders placed during the hospital encounter of 02/23/12 (from the past 48 hour(s))  URINALYSIS, ROUTINE W REFLEX MICROSCOPIC     Status: Abnormal   Collection Time   02/23/12  6:14 PM      Component Value Range Comment   Color, Urine RED (*) YELLOW BIOCHEMICALS MAY BE AFFECTED BY COLOR   APPearance TURBID (*) CLEAR    Specific Gravity,  Urine 1.025  1.005 - 1.030    pH 6.5  5.0 - 8.0    Glucose, UA NEGATIVE  NEGATIVE mg/dL    Hgb urine dipstick LARGE (*) NEGATIVE    Bilirubin Urine NEGATIVE  NEGATIVE    Ketones, ur TRACE (*) NEGATIVE mg/dL    Protein, ur >981 (*) NEGATIVE mg/dL    Urobilinogen, UA 1.0  0.0 - 1.0 mg/dL    Nitrite NEGATIVE  NEGATIVE    Leukocytes, UA MODERATE (*) NEGATIVE   URINE MICROSCOPIC-ADD ON     Status: Abnormal   Collection Time   02/23/12  6:14 PM      Component Value Range Comment   WBC, UA 7-10  <3 WBC/hpf    RBC / HPF 21-50  <3 RBC/hpf    Bacteria, UA MANY (*) RARE    Urine-Other RARE YEAST     POCT I-STAT, CHEM 8     Status: Abnormal    Collection Time   02/23/12  6:32 PM      Component Value Range Comment   Sodium 138  135 - 145 mEq/L    Potassium 3.7  3.5 - 5.1 mEq/L    Chloride 100  96 - 112 mEq/L    BUN 15  6 - 23 mg/dL    Creatinine, Ser 1.91  0.50 - 1.10 mg/dL    Glucose, Bld 478 (*) 70 - 99 mg/dL    Calcium, Ion 2.95  6.21 - 1.32 mmol/L    TCO2 26  0 - 100 mmol/L    Hemoglobin 8.5 (*) 12.0 - 15.0 g/dL    HCT 30.8 (*) 65.7 - 46.0 %   LACTIC ACID, PLASMA     Status: Normal   Collection Time   02/23/12  6:40 PM      Component Value Range Comment   Lactic Acid, Venous 0.9  0.5 - 2.2 mmol/L   CBC WITH DIFFERENTIAL     Status: Abnormal   Collection Time   02/23/12  7:09 PM      Component Value Range Comment   WBC 8.0  4.0 - 10.5 K/uL    RBC 2.90 (*) 3.87 - 5.11 MIL/uL    Hemoglobin 7.8 (*) 12.0 - 15.0 g/dL    HCT 84.6 (*) 96.2 - 46.0 %    MCV 85.5  78.0 - 100.0 fL    MCH 26.9  26.0 - 34.0 pg    MCHC 31.5  30.0 - 36.0 g/dL    RDW 95.2 (*) 84.1 - 15.5 %    Platelets 189  150 - 400 K/uL    Neutrophils Relative 72  43 - 77 %    Neutro Abs 5.7  1.7 - 7.7 K/uL    Lymphocytes Relative 16  12 - 46 %    Lymphs Abs 1.3  0.7 - 4.0 K/uL    Monocytes Relative 12  3 - 12 %    Monocytes Absolute 0.9  0.1 - 1.0 K/uL    Eosinophils Relative 1  0 - 5 %    Eosinophils Absolute 0.1  0.0 - 0.7 K/uL    Basophils Relative 0  0 - 1 %    Basophils Absolute 0.0  0.0 - 0.1 K/uL   PROCALCITONIN     Status: Normal   Collection Time   02/23/12  7:09 PM      Component Value Range Comment   Procalcitonin <0.10     TYPE AND SCREEN     Status: Normal (Preliminary result)  Collection Time   02/23/12  8:02 PM      Component Value Range Comment   ABO/RH(D) A NEG      Antibody Screen NEG      Sample Expiration 02/26/2012      Unit Number [id number]      Blood Component Type RED CELLS,LR      Unit division 00      Status of Unit ISSUED      Transfusion Status OK TO TRANSFUSE      Crossmatch Result Compatible      Unit Number  [id number]      Blood Component Type RED CELLS,LR      Unit division 00      Status of Unit ISSUED      Transfusion Status OK TO TRANSFUSE      Crossmatch Result Compatible     VITAMIN B12     Status: Normal   Collection Time   02/23/12  8:18 PM      Component Value Range Comment   Vitamin B-12 589  211 - 911 pg/mL   FOLATE     Status: Normal   Collection Time   02/23/12  8:18 PM      Component Value Range Comment   Folate >20.0     IRON AND TIBC     Status: Abnormal   Collection Time   02/23/12  8:18 PM      Component Value Range Comment   Iron <10 (*) 42 - 135 ug/dL    TIBC Not calculated due to Iron <10.  250 - 470 ug/dL    Saturation Ratios Not calculated due to Iron <10.  20 - 55 %    UIBC 290  125 - 400 ug/dL   FERRITIN     Status: Normal   Collection Time   02/23/12  8:18 PM      Component Value Range Comment   Ferritin 21  10 - 291 ng/mL   RETICULOCYTES     Status: Abnormal   Collection Time   02/23/12  8:18 PM      Component Value Range Comment   Retic Ct Pct 4.4 (*) 0.4 - 3.1 %    RBC. 2.86 (*) 3.87 - 5.11 MIL/uL    Retic Count, Manual 125.8  19.0 - 186.0 K/uL   ABO/RH     Status: Normal   Collection Time   02/23/12  8:52 PM      Component Value Range Comment   ABO/RH(D) A NEG     PREPARE RBC (CROSSMATCH)     Status: Normal   Collection Time   02/23/12  9:00 PM      Component Value Range Comment   Order Confirmation ORDER PROCESSED BY BLOOD BANK     HEMOGLOBIN     Status: Abnormal   Collection Time   02/24/12  6:34 AM      Component Value Range Comment   Hemoglobin 7.7 (*) 12.0 - 15.0 g/dL   HEMATOCRIT     Status: Abnormal   Collection Time   02/24/12  6:34 AM      Component Value Range Comment   HCT 24.1 (*) 36.0 - 46.0 %   TSH     Status: Abnormal   Collection Time   02/24/12  6:34 AM      Component Value Range Comment   TSH 0.300 (*) 0.350 - 4.500 uIU/mL   PREPARE RBC (CROSSMATCH)     Status: Normal   Collection Time  02/24/12 10:30 AM      Component  Value Range Comment   Order Confirmation ORDER PROCESSED BY BLOOD BANK     HEMOGLOBIN     Status: Abnormal   Collection Time   02/24/12  3:56 PM      Component Value Range Comment   Hemoglobin 8.7 (*) 12.0 - 15.0 g/dL   HEMATOCRIT     Status: Abnormal   Collection Time   02/24/12  3:59 PM      Component Value Range Comment   HCT 27.2 (*) 36.0 - 46.0 %     Ct Abdomen Pelvis Wo Contrast  02/23/2012  *RADIOLOGY REPORT*  Clinical Data: Hematuria  CT ABDOMEN AND PELVIS WITHOUT CONTRAST  Technique:  Multidetector CT imaging of the abdomen and pelvis was performed following the standard protocol without intravenous contrast.  Comparison: None.  Findings: Foley catheter is in place within the base of the bladder.  Large heterogeneous and hyperdense mass is seen filling much of the bladder measuring 7.1 x 7.0 x 6.8 cm.  No evidence of hydronephrosis.  No urinary calculus.  Hyperdense sludge or layering gallstones in the gallbladder.  Liver, spleen, pancreas, adrenal glands within normal limits. Degenerative changes in the lumbar spine.  No obvious abnormal adenopathy.  No free fluid.  IMPRESSION: Large mass in the bladder as described.  This may simply represent a large hematoma.  Underlying urothelial mass cannot be excluded.  Gallstones versus gallbladder sludge.  No urinary calculus or hydronephrosis.  Original Report Authenticated By: Donavan Burnet, M.D.   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  DIGITAL DIAGNOSTIC RIGHT MAMMOGRAM WITH CAD  Comparison:  07/29/2011 and prior mammograms dating back to 07/06/2011.  Findings:  Magnification and routine views of the right breast again demonstrate scattered fibroglandular densities. A biopsy clip within the upper outer right breast is again identified with a few adjacent calcifications which are unchanged. No new or  suspicious calcifications are identified. There is no evidence of mass or nonsurgical distortion noted. Mammographic images were processed with CAD.  IMPRESSION: Stable benign calcifications in the upper outer right breast.  No mammographic evidence of right breast malignancy.  These findings were discussed with the patient and her questions answered.  She was encouraged to begin/continue monthly self exams and to contact her primary physician if any changes noted.  BI-RADS CATEGORY 2:  Benign finding(s).  Recommend bilateral screening mammograms in November 2013 to resume annual schedule.  Original Report Authenticated By: Rosendo Gros, M.D.     Disposition: home  Diet: cardiac  Activity: as tolerated   Follow-up Appts: Discharge Orders    Future Orders Please Complete By Expires   Diet - low sodium heart healthy      Increase activity slowly      Discharge instructions      Comments:   D/c with foley      TESTS THAT NEED FOLLOW-UP CBC Home health for foley management  Time spent on discharge, talking to the patient, and coordinating care: 45 mins.   SignedMarlin Canary, DO 02/24/2012, 5:22 PM

## 2012-02-24 NOTE — Consult Note (Signed)
Urology Consult  Referring physician: Ashley Royalty Reason for referral: Bladder mas  Chief Complaint: Bladder mass and hematuria  History of Present Illness: Recent hematuria treated with macrodantin and foley. Fatigue. Dizzy. Hypotensive as noted. Hb 7.8. Rocephin given. Smoked years ago. Cr. .9 7 cm mass in bladder- ? Hematoma vs. Cancer No pain No increased frequency Normally wears 3 pads per day for mild incontinence and frequency Modifying factors: There are no other modifying factors  Associated signs and symptoms: There are no other associated signs and symptoms Aggravating and relieving factors: There are no other aggravating or relieving factors Severity: Moderate Duration: Persistent      Past Medical History  Diagnosis Date  . Hypertension   . Thyroid disease   . Diabetes mellitus     borderline diabetes   Past Surgical History  Procedure Date  . Abdominal hysterectomy     Medications: I have reviewed the patient's current medications. Allergies: No Known Allergies  History reviewed. No pertinent family history. Social History:  reports that she quit smoking about 2 years ago. She has never used smokeless tobacco. She reports that she does not drink alcohol or use illicit drugs.  ROS: All systems are reviewed and negative except as noted.   Physical Exam:  Vital signs in last 24 hours: Temp:  [98.1 F (36.7 C)-99.2 F (37.3 C)] 98.1 F (36.7 C) (06/27 0525) Pulse Rate:  [70-91] 77  (06/27 0525) Resp:  [16-24] 18  (06/27 0525) BP: (89-144)/(51-74) 144/68 mmHg (06/27 0525) SpO2:  [90 %-100 %] 98 % (06/27 0525) Weight:  [94.7 kg (208 lb 12.4 oz)] 94.7 kg (208 lb 12.4 oz) (06/26 2203)  Cardiovascular: Skin warm; not flushed Respiratory: Breaths quiet; no shortness of breath Abdomen: No masses Neurological: Normal sensation to touch Musculoskeletal: Normal motor function arms and legs Lymphatics: No inguinal adenopathy Skin: No rashes Genitourinary:no  abdominal tender  Laboratory Data:  Results for orders placed during the hospital encounter of 02/23/12 (from the past 72 hour(s))  URINALYSIS, ROUTINE W REFLEX MICROSCOPIC     Status: Abnormal   Collection Time   02/23/12  6:14 PM      Component Value Range Comment   Color, Urine RED (*) YELLOW BIOCHEMICALS MAY BE AFFECTED BY COLOR   APPearance TURBID (*) CLEAR    Specific Gravity, Urine 1.025  1.005 - 1.030    pH 6.5  5.0 - 8.0    Glucose, UA NEGATIVE  NEGATIVE mg/dL    Hgb urine dipstick LARGE (*) NEGATIVE    Bilirubin Urine NEGATIVE  NEGATIVE    Ketones, ur TRACE (*) NEGATIVE mg/dL    Protein, ur >782 (*) NEGATIVE mg/dL    Urobilinogen, UA 1.0  0.0 - 1.0 mg/dL    Nitrite NEGATIVE  NEGATIVE    Leukocytes, UA MODERATE (*) NEGATIVE   URINE MICROSCOPIC-ADD ON     Status: Abnormal   Collection Time   02/23/12  6:14 PM      Component Value Range Comment   WBC, UA 7-10  <3 WBC/hpf    RBC / HPF 21-50  <3 RBC/hpf    Bacteria, UA MANY (*) RARE    Urine-Other RARE YEAST     POCT I-STAT, CHEM 8     Status: Abnormal   Collection Time   02/23/12  6:32 PM      Component Value Range Comment   Sodium 138  135 - 145 mEq/L    Potassium 3.7  3.5 - 5.1 mEq/L    Chloride  100  96 - 112 mEq/L    BUN 15  6 - 23 mg/dL    Creatinine, Ser 4.54  0.50 - 1.10 mg/dL    Glucose, Bld 098 (*) 70 - 99 mg/dL    Calcium, Ion 1.19  1.47 - 1.32 mmol/L    TCO2 26  0 - 100 mmol/L    Hemoglobin 8.5 (*) 12.0 - 15.0 g/dL    HCT 82.9 (*) 56.2 - 46.0 %   LACTIC ACID, PLASMA     Status: Normal   Collection Time   02/23/12  6:40 PM      Component Value Range Comment   Lactic Acid, Venous 0.9  0.5 - 2.2 mmol/L   CBC WITH DIFFERENTIAL     Status: Abnormal   Collection Time   02/23/12  7:09 PM      Component Value Range Comment   WBC 8.0  4.0 - 10.5 K/uL    RBC 2.90 (*) 3.87 - 5.11 MIL/uL    Hemoglobin 7.8 (*) 12.0 - 15.0 g/dL    HCT 13.0 (*) 86.5 - 46.0 %    MCV 85.5  78.0 - 100.0 fL    MCH 26.9  26.0 - 34.0 pg     MCHC 31.5  30.0 - 36.0 g/dL    RDW 78.4 (*) 69.6 - 15.5 %    Platelets 189  150 - 400 K/uL    Neutrophils Relative 72  43 - 77 %    Neutro Abs 5.7  1.7 - 7.7 K/uL    Lymphocytes Relative 16  12 - 46 %    Lymphs Abs 1.3  0.7 - 4.0 K/uL    Monocytes Relative 12  3 - 12 %    Monocytes Absolute 0.9  0.1 - 1.0 K/uL    Eosinophils Relative 1  0 - 5 %    Eosinophils Absolute 0.1  0.0 - 0.7 K/uL    Basophils Relative 0  0 - 1 %    Basophils Absolute 0.0  0.0 - 0.1 K/uL   PROCALCITONIN     Status: Normal   Collection Time   02/23/12  7:09 PM      Component Value Range Comment   Procalcitonin <0.10     TYPE AND SCREEN     Status: Normal (Preliminary result)   Collection Time   02/23/12  8:02 PM      Component Value Range Comment   ABO/RH(D) A NEG      Antibody Screen NEG      Sample Expiration 02/26/2012      Unit Number [id number]      Blood Component Type RED CELLS,LR      Unit division 00      Status of Unit ISSUED      Transfusion Status OK TO TRANSFUSE      Crossmatch Result Compatible      Unit Number [id number]      Blood Component Type RED CELLS,LR      Unit division 00      Status of Unit ALLOCATED      Transfusion Status OK TO TRANSFUSE      Crossmatch Result Compatible     VITAMIN B12     Status: Normal   Collection Time   02/23/12  8:18 PM      Component Value Range Comment   Vitamin B-12 589  211 - 911 pg/mL   FOLATE     Status: Normal   Collection Time   02/23/12  8:18 PM      Component Value Range Comment   Folate >20.0     IRON AND TIBC     Status: Abnormal   Collection Time   02/23/12  8:18 PM      Component Value Range Comment   Iron <10 (*) 42 - 135 ug/dL    TIBC Not calculated due to Iron <10.  250 - 470 ug/dL    Saturation Ratios Not calculated due to Iron <10.  20 - 55 %    UIBC 290  125 - 400 ug/dL   FERRITIN     Status: Normal   Collection Time   02/23/12  8:18 PM      Component Value Range Comment   Ferritin 21  10 - 291 ng/mL   RETICULOCYTES      Status: Abnormal   Collection Time   02/23/12  8:18 PM      Component Value Range Comment   Retic Ct Pct 4.4 (*) 0.4 - 3.1 %    RBC. 2.86 (*) 3.87 - 5.11 MIL/uL    Retic Count, Manual 125.8  19.0 - 186.0 K/uL   ABO/RH     Status: Normal   Collection Time   02/23/12  8:52 PM      Component Value Range Comment   ABO/RH(D) A NEG     PREPARE RBC (CROSSMATCH)     Status: Normal   Collection Time   02/23/12  9:00 PM      Component Value Range Comment   Order Confirmation ORDER PROCESSED BY BLOOD BANK     HEMOGLOBIN     Status: Abnormal   Collection Time   02/24/12  6:34 AM      Component Value Range Comment   Hemoglobin 7.7 (*) 12.0 - 15.0 g/dL   HEMATOCRIT     Status: Abnormal   Collection Time   02/24/12  6:34 AM      Component Value Range Comment   HCT 24.1 (*) 36.0 - 46.0 %    No results found for this or any previous visit (from the past 240 hour(s)). 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

## 2012-02-25 LAB — URINE CULTURE: Culture: NO GROWTH

## 2012-02-25 LAB — TYPE AND SCREEN: Unit division: 0

## 2012-02-25 NOTE — Progress Notes (Signed)
CARE MANAGEMENT NOTE 02/25/2012  Patient:  DELECIA, VASTINE   Account Number:  [id number]  Date Initiated:  02/24/2012  Documentation initiated by:  Vance Peper  Subjective/Objective Assessment:   71 yr old female admitted for Hematuria.     Action/Plan:   5:30pm Patient to be discharged with indwelling catheter, On call CaseManager  spoke with patient and offered choice for Home Health. Asked Oralia Rud to fax H&P, Order and face sheet to [facility]. CM to follow up in the am.   Anticipated DC Date:  02/24/2012   Anticipated DC Plan:  HOME W HOME HEALTH SERVICES      DC Planning Services  CM consult      [facility] Choice  HOME HEALTH   Choice offered to / List presented to:  C-1 Patient        HH arranged  HH-1 RN      [facility] agency  [facility]   Status of service:  Completed, signed off Medicare Important Message given?   (If response is "NO", the following Medicare IM given date fields will be blank) Date Medicare IM given:   Date Additional Medicare IM given:    Discharge Disposition:  HOME W HOME HEALTH SERVICES  Per UR Regulation:    If discussed at Long Length of Stay Meetings, dates discussed:    Comments:  02/24/12 5:40pm Vance Peper, RN BSN On Call Case Manager Informed patient that she will receive a call from the [facility] office on 02/25/12 to begin [facility] care.

## 2012-02-29 ENCOUNTER — Other Ambulatory Visit: Payer: Self-pay | Admitting: Urology

## 2012-03-21 ENCOUNTER — Encounter (HOSPITAL_COMMUNITY): Payer: Self-pay | Admitting: *Deleted

## 2012-03-21 ENCOUNTER — Inpatient Hospital Stay (HOSPITAL_COMMUNITY)
Admission: EM | Admit: 2012-03-21 | Discharge: 2012-03-24 | DRG: 669 | Disposition: A | Discharge status: Home / Self Care | Payer: Medicare Other | Attending: Urology | Admitting: Urology

## 2012-03-21 ENCOUNTER — Inpatient Hospital Stay (HOSPITAL_COMMUNITY): Payer: Medicare Other

## 2012-03-21 DIAGNOSIS — I959 Hypotension, unspecified: Secondary | ICD-10-CM | POA: Diagnosis present

## 2012-03-21 DIAGNOSIS — D649 Anemia, unspecified: Secondary | ICD-10-CM | POA: Diagnosis present

## 2012-03-21 DIAGNOSIS — R310 Gross hematuria: Secondary | ICD-10-CM

## 2012-03-21 DIAGNOSIS — D62 Acute posthemorrhagic anemia: Secondary | ICD-10-CM | POA: Diagnosis present

## 2012-03-21 DIAGNOSIS — I10 Essential (primary) hypertension: Secondary | ICD-10-CM | POA: Diagnosis present

## 2012-03-21 DIAGNOSIS — N3289 Other specified disorders of bladder: Secondary | ICD-10-CM

## 2012-03-21 DIAGNOSIS — C679 Malignant neoplasm of bladder, unspecified: Principal | ICD-10-CM | POA: Diagnosis present

## 2012-03-21 DIAGNOSIS — E119 Type 2 diabetes mellitus without complications: Secondary | ICD-10-CM | POA: Diagnosis present

## 2012-03-21 DIAGNOSIS — R319 Hematuria, unspecified: Secondary | ICD-10-CM

## 2012-03-21 DIAGNOSIS — R55 Syncope and collapse: Secondary | ICD-10-CM | POA: Diagnosis present

## 2012-03-21 HISTORY — DX: Neoplasm of unspecified behavior of bladder: D49.4

## 2012-03-21 LAB — BASIC METABOLIC PANEL
BUN: 16 mg/dL (ref 6–23)
CO2: 27 mEq/L (ref 19–32)
Calcium: 9.1 mg/dL (ref 8.4–10.5)
Chloride: 98 mEq/L (ref 96–112)
Creatinine, Ser: 0.73 mg/dL (ref 0.50–1.10)
GFR calc Af Amer: >90 mL/min (ref >90)
GFR calc non Af Amer: 83 mL/min — ABNORMAL LOW (ref >90)
Glucose, Bld: 107 mg/dL — ABNORMAL HIGH (ref 70–99)
Potassium: 3.5 mEq/L (ref 3.5–5.1)
Sodium: 133 mEq/L — ABNORMAL LOW (ref 135–145)

## 2012-03-21 LAB — URINE MICROSCOPIC-ADD ON

## 2012-03-21 LAB — CBC
HCT: 19.3 % — ABNORMAL LOW (ref 36.0–46.0)
Hemoglobin: 6.1 g/dL — CL (ref 12.0–15.0)
MCH: 23.8 pg — ABNORMAL LOW (ref 26.0–34.0)
MCHC: 31.6 g/dL (ref 30.0–36.0)
MCV: 75.4 fL — ABNORMAL LOW (ref 78.0–100.0)
Platelets: 216 10*3/uL (ref 150–400)
RBC: 2.56 MIL/uL — ABNORMAL LOW (ref 3.87–5.11)
RDW: 18 % — ABNORMAL HIGH (ref 11.5–15.5)
WBC: 6.8 10*3/uL (ref 4.0–10.5)

## 2012-03-21 LAB — GLUCOSE, CAPILLARY: Glucose-Capillary: 95 mg/dL (ref 70–99)

## 2012-03-21 LAB — URINALYSIS, ROUTINE W REFLEX MICROSCOPIC
Glucose, UA: NEGATIVE mg/dL
Ketones, ur: 15 mg/dL — AB
Nitrite: NEGATIVE
Protein, ur: >300 mg/dL — AB
Specific Gravity, Urine: 1.018 (ref 1.005–1.030)
Urobilinogen, UA: 1 mg/dL (ref 0.0–1.0)
pH: 6 (ref 5.0–8.0)

## 2012-03-21 LAB — PREPARE RBC (CROSSMATCH)

## 2012-03-21 MED ORDER — METHIMAZOLE 10 MG PO TABS
10.0000 mg | ORAL_TABLET | Freq: Every day | ORAL | Status: DC
  Administered 2012-03-23 – 2012-03-24 (×2): 10 mg via ORAL
  Filled 2012-03-21 (×3): qty 1

## 2012-03-21 MED ORDER — SODIUM CHLORIDE 0.9 % IJ SOLN
3.0000 mL | Freq: Two times a day (BID) | INTRAMUSCULAR | Status: DC
  Administered 2012-03-21: 3 mL via INTRAVENOUS

## 2012-03-21 MED ORDER — LOSARTAN POTASSIUM-HCTZ 100-25 MG PO TABS: 1.0000 | ORAL_TABLET | Freq: Every day | ORAL | Status: DC

## 2012-03-21 MED ORDER — HYDROCHLOROTHIAZIDE 25 MG PO TABS
25.0000 mg | ORAL_TABLET | Freq: Every day | ORAL | Status: DC
  Filled 2012-03-21: qty 1

## 2012-03-21 MED ORDER — DEXTROSE 5 % IV SOLN
1.0000 g | INTRAVENOUS | Status: DC
  Administered 2012-03-21 – 2012-03-24 (×3): 1 g via INTRAVENOUS
  Filled 2012-03-21 (×4): qty 10

## 2012-03-21 MED ORDER — LOSARTAN POTASSIUM 50 MG PO TABS
100.0000 mg | ORAL_TABLET | Freq: Every day | ORAL | Status: DC
  Filled 2012-03-21: qty 2

## 2012-03-21 MED ORDER — INSULIN ASPART 100 UNIT/ML ~~LOC~~ SOLN
0.0000 [IU] | SUBCUTANEOUS | Status: DC
  Administered 2012-03-22: 2 [IU] via SUBCUTANEOUS

## 2012-03-21 MED ORDER — SODIUM CHLORIDE 0.9 % IV SOLN
INTRAVENOUS | Status: DC
  Administered 2012-03-21 – 2012-03-23 (×3): via INTRAVENOUS

## 2012-03-21 MED ORDER — INSULIN ASPART 100 UNIT/ML ~~LOC~~ SOLN: 0.0000 [IU] | Freq: Three times a day (TID) | SUBCUTANEOUS | Status: DC

## 2012-03-21 MED ORDER — HYDROCODONE-ACETAMINOPHEN 5-325 MG PO TABS
1.0000 | ORAL_TABLET | Freq: Four times a day (QID) | ORAL | Status: DC | PRN
  Administered 2012-03-21: 1 via ORAL
  Filled 2012-03-21: qty 1

## 2012-03-21 NOTE — ED Notes (Signed)
AVW:[id number]<XB> Expected date:03/21/12<BR> Expected time: 6:07 PM<BR> Means of arrival:Ambulance<BR> Comments:<BR> Syncope

## 2012-03-21 NOTE — ED Notes (Signed)
Per EMS pt from home, pt was alert when they arrived, family states pt had a syncopal episode, denies pain, hitting head, pt states she's tired. CBG 115, sinus on monitor, went to doctor today, lab work w/ pt.

## 2012-03-21 NOTE — ED Notes (Signed)
Patient stated, "i was sitting on my front porch, got up, went in the apartment, came back out to meet daughter in driveway, and grandson saw her stumble backwards and he caught her. Placed her in a chair on the porch. Patient feels lightheaded and dizzy, denies N/V/D, SOB, pain, blurred vision. Patient went to her primary physician today in [place], labs drawn, and HGB was 6.1. Family sent labs with EMS.

## 2012-03-21 NOTE — ED Provider Notes (Signed)
History    71 year old female with dizziness and lightheadedness. Patient has been having ongoing hematuria for over a month. Recently admitted for the same. Had CT scan during last admission which showed a large bladder mass. She was evaluated by Dr Sherron Monday, urology, at that time and scheduled to have a cystoscopy in a couple weeks by Dr Vernie Ammons. She reports continued hematuria. The last several days she's had increasing exertional dyspnea, generalized fatigue and intermittent dizziness. Earlier today almost passed out. She denies any pain anywhere. No urinary complaints aside from the hematuria.   CSN: [id number]  Arrival date & time 03/21/12  1821   First MD Initiated Contact with Patient 03/21/12 1910      Chief Complaint  Patient presents with  . Near Syncope  . Weakness    (Consider location/radiation/quality/duration/timing/severity/associated sxs/prior treatment) HPI  Past Medical History  Diagnosis Date  . Hypertension   . Thyroid disease   . Diabetes mellitus     borderline diabetes  . UTI (lower urinary tract infection)     Past Surgical History  Procedure Date  . Abdominal hysterectomy     History reviewed. No pertinent family history.  History  Substance Use Topics  . Smoking status: Former Smoker -- 1.0 packs/day for 50 years    Types: Cigarettes    Quit date: 09/24/2006  . Smokeless tobacco: Never Used  . Alcohol Use: No    OB History    Grav Para Term Preterm Abortions TAB SAB Ect Mult Living                  Review of Systems   Review of symptoms negative unless otherwise noted in HPI.   Allergies  Review of patient's allergies indicates no known allergies.  Home Medications   Current Outpatient Rx  Name Route Sig Dispense Refill  . LOSARTAN POTASSIUM-HCTZ 100-25 MG PO TABS Oral Take 1 tablet by mouth daily.    [name] METHIMAZOLE 10 MG PO TABS Oral Take 10 mg by mouth daily.       BP 131/57  Pulse 91  Temp 98.3 F (36.8 C) (Oral)   Resp 16  SpO2 93%  Physical Exam  Nursing note and vitals reviewed. Constitutional: She is oriented to person, place, and time. She appears well-developed and well-nourished. No distress.  HENT:  Head: Normocephalic and atraumatic.  Eyes: Conjunctivae are normal. Right eye exhibits no discharge. Left eye exhibits no discharge.  Neck: Neck supple.  Cardiovascular: Regular rhythm and normal heart sounds.  Exam reveals no gallop and no friction rub.   No murmur heard.      Mild tachycardia  Pulmonary/Chest: Effort normal and breath sounds normal. No respiratory distress.  Abdominal: Soft. She exhibits no distension and no mass. There is no tenderness. There is no rebound.  Genitourinary:       No CVA tenderness  Musculoskeletal: She exhibits no edema and no tenderness.  Neurological: She is alert and oriented to person, place, and time.  Skin: Skin is warm and dry. She is not diaphoretic.  Psychiatric: She has a normal mood and affect. Her behavior is normal. Thought content normal.    ED Course  Procedures (including critical care time)  CRITICAL CARE Performed by: Raeford Razor   Total critical care time: 35 minutes  Critical care time was exclusive of separately billable procedures and treating other patients.  Critical care was necessary to treat or prevent imminent or life-threatening deterioration.  Critical care was time spent personally by  me on the following activities: development of treatment plan with patient and/or surrogate as well as nursing, discussions with consultants, evaluation of patient's response to treatment, examination of patient, obtaining history from patient or surrogate, ordering and performing treatments and interventions, ordering and review of laboratory studies, ordering and review of radiographic studies, pulse oximetry and re-evaluation of patient's condition.   Labs Reviewed  URINALYSIS, ROUTINE W REFLEX MICROSCOPIC - Abnormal; Notable for  the following:    Color, Urine RED (*)  BIOCHEMICALS MAY BE AFFECTED BY COLOR   APPearance CLOUDY (*)     Hgb urine dipstick LARGE (*)     Bilirubin Urine MODERATE (*)     Ketones, ur 15 (*)     Protein, ur >300 (*)     Leukocytes, UA LARGE (*)     All other components within normal limits  BASIC METABOLIC PANEL - Abnormal; Notable for the following:    Sodium 133 (*)     Glucose, Bld 107 (*)     GFR calc non Af Amer 83 (*)     All other components within normal limits  CBC - Abnormal; Notable for the following:    RBC 2.56 (*)     Hemoglobin 6.1 (*)     HCT 19.3 (*)     MCV 75.4 (*)     MCH 23.8 (*)     RDW 18.0 (*)     All other components within normal limits  URINE MICROSCOPIC-ADD ON - Abnormal; Notable for the following:    Casts HYALINE CASTS (*)     All other components within normal limits  TYPE AND SCREEN  ABO/RH  ANTIBODY SCREEN  PREPARE RBC (CROSSMATCH)   No results found.  EKG:  Rhythm: Normal sinus with PACs Rate: 92 Axis: Normal Intervals: First degree AV block ST segments: Nonspecific ST changes   1. Acute blood loss anemia   2. Hematuria       MDM  71 year old female with generalized fatigue and exertional dyspnea. Suspect that this is related to blood loss anemia from hematuria. Consider cardiac or pulmonary etiology but feel this much less likely. Hemoglobin is 6.1. Mild tachycardia. Given symptoms and ongoing hematuria, will transfuse 2 units of PRBC. Previous urine cultures did not show any growth. Patient with no other urinary symptoms aside from her hematuria. Antibiotics considered but deferred at this time. Renal function normal. Patient will require admission to the hospital and further urologic evaluation.        Raeford Razor, MD 03/21/12 2103

## 2012-03-21 NOTE — H&P (Signed)
Sonya Oliver is an 71 y.o. female.    Pcp: Robbi Garter [facility]  Chief Complaint: near syncope HPI: 71 yo female with dm2, hypertension, Hyperthyroidism, Bladder tumor was at pcp office today and had routine cbc drawn and was found to be anemic with hgb 6.1   At home today she was walking up side walk and she passed out, her grandson caught her.  Pt denies presyncopal symptoms. Denies cp, palp, sob, n/v, diarrhea, brbpr,black stool, dysuria,  + hematuria (x 1 month).    Pt therefore presented to the ED for evaluation for syncope and also anemia.     Past Medical History  Diagnosis Date  . Hypertension   . Thyroid disease   . Diabetes mellitus     borderline diabetes  . UTI (lower urinary tract infection)   . 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 Abdominal hysterectomy     Family History  Problem Relation Age of Onset  . Diabetes Mother   . Diabetes Father    Social History:  reports that she quit smoking about 5 years ago. Her smoking use included Cigarettes. She has a 50 pack-year smoking history. She has never used smokeless tobacco. She reports that she does not drink alcohol or use illicit drugs.  Allergies: No Known Allergies   (Not in a hospital admission)  Results for orders placed during the hospital encounter of 03/21/12 (from the past 48 hour(s))  URINALYSIS, ROUTINE W REFLEX MICROSCOPIC     Status: Abnormal   Collection Time   03/21/12  7:36 PM      Component Value Range Comment   Color, Urine RED (*) YELLOW BIOCHEMICALS MAY BE AFFECTED BY COLOR   APPearance CLOUDY (*) CLEAR    Specific Gravity, Urine 1.018  1.005 - 1.030    pH 6.0  5.0 - 8.0    Glucose, UA NEGATIVE  NEGATIVE mg/dL    Hgb urine dipstick LARGE (*) NEGATIVE    Bilirubin Urine MODERATE (*) NEGATIVE    Ketones, ur 15 (*) NEGATIVE mg/dL    Protein, ur >161 (*) NEGATIVE mg/dL    Urobilinogen, UA 1.0  0.0 - 1.0 mg/dL     Nitrite NEGATIVE  NEGATIVE    Leukocytes, UA LARGE (*) NEGATIVE   URINE MICROSCOPIC-ADD ON     Status: Abnormal   Collection Time   03/21/12  7:36 PM      Component Value Range Comment   Squamous Epithelial / LPF RARE  RARE    WBC, UA 7-10  <3 WBC/hpf    RBC / HPF TOO NUMEROUS TO COUNT  <3 RBC/hpf    Bacteria, UA RARE  RARE    Casts HYALINE CASTS (*) NEGATIVE   BASIC METABOLIC PANEL     Status: Abnormal   Collection Time   03/21/12  7:55 PM      Component Value Range Comment   Sodium 133 (*) 135 - 145 mEq/L    Potassium 3.5  3.5 - 5.1 mEq/L    Chloride 98  96 - 112 mEq/L    CO2 27  19 - 32 mEq/L    Glucose, Bld 107 (*) 70 - 99 mg/dL    BUN 16  6 - 23 mg/dL    Creatinine, Ser 0.96  0.50 - 1.10 mg/dL    Calcium 9.1  8.4 - 04.5 mg/dL    GFR calc non Af Denyse Dago  83 (*) >90 mL/min    GFR calc Af Amer >90  >90 mL/min   TYPE AND SCREEN     Status: Normal (Preliminary result)   Collection Time   03/21/12  7:55 PM      Component Value Range Comment   ABO/RH(D) A NEG      Antibody Screen NEG      Sample Expiration 03/24/2012      Unit Number [id number]      Blood Component Type RED CELLS,LR      Unit division 00      Status of Unit ALLOCATED      Transfusion Status OK TO TRANSFUSE      Crossmatch Result Compatible      Unit Number [id number]      Blood Component Type RED CELLS,LR      Unit division 00      Status of Unit ALLOCATED      Transfusion Status OK TO TRANSFUSE      Crossmatch Result Compatible     CBC     Status: Abnormal   Collection Time   03/21/12  7:55 PM      Component Value Range Comment   WBC 6.8  4.0 - 10.5 K/uL    RBC 2.56 (*) 3.87 - 5.11 MIL/uL    Hemoglobin 6.1 (*) 12.0 - 15.0 g/dL    HCT 01.0 (*) 27.2 - 46.0 %    MCV 75.4 (*) 78.0 - 100.0 fL    MCH 23.8 (*) 26.0 - 34.0 pg    MCHC 31.6  30.0 - 36.0 g/dL    RDW 53.6 (*) 64.4 - 15.5 %    Platelets 216  150 - 400 K/uL   PREPARE RBC (CROSSMATCH)     Status: Normal   Collection Time   03/21/12  9:00 PM       Component Value Range Comment   Order Confirmation ORDER PROCESSED BY BLOOD BANK      No results found.  Review of Systems  Constitutional: Positive for weight loss. Negative for fever, chills, malaise/fatigue and diaphoresis.  HENT: Negative for hearing loss, ear pain, nosebleeds, congestion, neck pain, tinnitus and ear discharge.   Eyes: Negative for blurred vision, double vision, photophobia, pain, discharge and redness.  Respiratory: Negative for cough, hemoptysis, sputum production, shortness of breath and wheezing.   Cardiovascular: Negative for chest pain, palpitations, orthopnea, claudication, leg swelling and PND.  Gastrointestinal: Negative for heartburn, nausea, vomiting, abdominal pain, diarrhea, constipation, blood in stool and melena.  Genitourinary: Positive for hematuria. Negative for dysuria, urgency, frequency and flank pain.  Musculoskeletal: Positive for joint pain. Negative for myalgias, back pain and falls.       Arthritis since age 78 per pt  Skin: Negative for itching and rash.  Neurological: Negative for dizziness, tingling, tremors, sensory change, speech change, focal weakness, seizures, loss of consciousness, weakness and headaches.  Endo/Heme/Allergies: Negative for environmental allergies and polydipsia. Does not bruise/bleed easily.  Psychiatric/Behavioral: Negative for depression, suicidal ideas, hallucinations, memory loss and substance abuse. The patient is not nervous/anxious and does not have insomnia.     Blood pressure 116/37, pulse 91, temperature 98.3 F (36.8 C), temperature source Oral, resp. rate 22, SpO2 93.00%. Physical Exam  Constitutional: She is oriented to person, place, and time. She appears well-developed and well-nourished. No distress.  HENT:  Head: Normocephalic and atraumatic.  Nose: Nose normal.  Mouth/Throat: No oropharyngeal exudate.  Eyes: EOM are normal. Pupils are equal, round, and reactive to  light. Right eye exhibits no  discharge. Left eye exhibits no discharge. No scleral icterus.       Pale conjuctivia  Neck: Normal range of motion. Neck supple. No JVD present. No tracheal deviation present. No thyromegaly present.  Cardiovascular: Normal rate, regular rhythm and normal heart sounds.  Exam reveals no gallop and no friction rub.   No murmur heard. Respiratory: Effort normal and breath sounds normal. No stridor. No respiratory distress. She has no wheezes. She has no rales. She exhibits no tenderness.  GI: Soft. Bowel sounds are normal. She exhibits no distension and no mass. There is no tenderness. There is no rebound and no guarding.  Musculoskeletal: Normal range of motion. She exhibits no edema and no tenderness.  Lymphadenopathy:    She has no cervical adenopathy.  Neurological: She is alert and oriented to person, place, and time. She has normal reflexes. She displays normal reflexes. No cranial nerve deficit. She exhibits normal muscle tone. Coordination normal.  Skin: Skin is warm and dry. No rash noted. She is not diaphoretic. No erythema. No pallor.  Psychiatric: She has a normal mood and affect. Her behavior is normal. Judgment and thought content normal.     Assessment/Plan Syncope Tele , cycle cardiac markers, carotid u/s, cardiac 2 d echo Check orthostatic bp  Anemia Type and cross 2 units prbc  Uti:  Rocephin 1gm iv qday  Bladder tumor: please consult urology in am.   Hyponatremia Ns iv   Pearson Grippe 03/21/2012, 9:25 PM

## 2012-03-22 ENCOUNTER — Inpatient Hospital Stay (HOSPITAL_COMMUNITY): Payer: Medicare Other

## 2012-03-22 DIAGNOSIS — I059 Rheumatic mitral valve disease, unspecified: Secondary | ICD-10-CM

## 2012-03-22 DIAGNOSIS — D62 Acute posthemorrhagic anemia: Secondary | ICD-10-CM

## 2012-03-22 DIAGNOSIS — R55 Syncope and collapse: Secondary | ICD-10-CM

## 2012-03-22 DIAGNOSIS — R319 Hematuria, unspecified: Secondary | ICD-10-CM

## 2012-03-22 LAB — CBC
HCT: 21.2 % — ABNORMAL LOW (ref 36.0–46.0)
Hemoglobin: 6.9 g/dL — CL (ref 12.0–15.0)
MCH: 25.1 pg — ABNORMAL LOW (ref 26.0–34.0)
MCH: 24.6 pg — ABNORMAL LOW (ref 26.0–34.0)
MCHC: 32.5 g/dL (ref 30.0–36.0)
MCV: 77.1 fL — ABNORMAL LOW (ref 78.0–100.0)
MCV: 78.5 fL (ref 78.0–100.0)
Platelets: 191 10*3/uL (ref 150–400)
RBC: 2.75 MIL/uL — ABNORMAL LOW (ref 3.87–5.11)
RDW: 17.1 % — ABNORMAL HIGH (ref 11.5–15.5)

## 2012-03-22 LAB — GLUCOSE, CAPILLARY
Glucose-Capillary: 175 mg/dL — ABNORMAL HIGH (ref 70–99)
Glucose-Capillary: 82 mg/dL (ref 70–99)
Glucose-Capillary: 99 mg/dL (ref 70–99)

## 2012-03-22 LAB — COMPREHENSIVE METABOLIC PANEL
ALT: 6 U/L (ref 0–35)
BUN: 14 mg/dL (ref 6–23)
CO2: 28 mEq/L (ref 19–32)
Calcium: 8.5 mg/dL (ref 8.4–10.5)
GFR calc Af Amer: >90 mL/min (ref >90)
GFR calc non Af Amer: 86 mL/min — ABNORMAL LOW (ref >90)
Glucose, Bld: 87 mg/dL (ref 70–99)
Sodium: 136 mEq/L (ref 135–145)
Total Protein: 6.2 g/dL (ref 6.0–8.3)

## 2012-03-22 LAB — CARDIAC PANEL(CRET KIN+CKTOT+MB+TROPI)
Relative Index: INVALID (ref 0.0–2.5)
Relative Index: INVALID (ref 0.0–2.5)
Total CK: 60 U/L (ref 7–177)
Troponin I: <0.3 ng/mL (ref <0.30)

## 2012-03-22 LAB — D-DIMER, QUANTITATIVE: D-Dimer, Quant: 3.1 ug/mL-FEU — ABNORMAL HIGH (ref 0.00–0.48)

## 2012-03-22 LAB — HEMOGLOBIN A1C: Hgb A1c MFr Bld: 5.8 % — ABNORMAL HIGH (ref <5.7)

## 2012-03-22 LAB — PREPARE RBC (CROSSMATCH)

## 2012-03-22 LAB — APTT: aPTT: 29 seconds (ref 24–37)

## 2012-03-22 MED ORDER — DIPHENHYDRAMINE HCL 25 MG PO CAPS
25.0000 mg | ORAL_CAPSULE | Freq: Once | ORAL | Status: AC
  Administered 2012-03-22: 25 mg via ORAL
  Filled 2012-03-22: qty 1

## 2012-03-22 MED ORDER — INSULIN ASPART 100 UNIT/ML ~~LOC~~ SOLN
3.0000 [IU] | Freq: Three times a day (TID) | SUBCUTANEOUS | Status: DC
  Administered 2012-03-24: 3 [IU] via SUBCUTANEOUS

## 2012-03-22 MED ORDER — IOHEXOL 350 MG/ML SOLN
100.0000 mL | Freq: Once | INTRAVENOUS | Status: AC | PRN
  Administered 2012-03-22: 100 mL via INTRAVENOUS

## 2012-03-22 MED ORDER — ACETAMINOPHEN 325 MG PO TABS
650.0000 mg | ORAL_TABLET | Freq: Once | ORAL | Status: AC
  Administered 2012-03-22: 650 mg via ORAL
  Filled 2012-03-22: qty 2

## 2012-03-22 MED ORDER — INSULIN ASPART 100 UNIT/ML ~~LOC~~ SOLN
0.0000 [IU] | Freq: Three times a day (TID) | SUBCUTANEOUS | Status: DC
  Administered 2012-03-24: 1 [IU] via SUBCUTANEOUS

## 2012-03-22 MED ORDER — FUROSEMIDE 10 MG/ML IJ SOLN
20.0000 mg | Freq: Once | INTRAMUSCULAR | Status: AC
  Administered 2012-03-23: 20 mg via INTRAVENOUS
  Filled 2012-03-22: qty 2

## 2012-03-22 MED ORDER — FUROSEMIDE 10 MG/ML IJ SOLN
20.0000 mg | Freq: Once | INTRAMUSCULAR | Status: AC
Start: 2012-03-22 — End: 2012-03-22
  Administered 2012-03-22: 20 mg via INTRAVENOUS
  Filled 2012-03-22: qty 2

## 2012-03-22 NOTE — Progress Notes (Signed)
  Echocardiogram 2D Echocardiogram has been performed.  Sonya Oliver 03/22/2012, 2:41 PM

## 2012-03-22 NOTE — Consult Note (Signed)
Urology Consult   Physician requesting consult: Dr. Mahala Menghini  Reason for consult: Hematuria and anemia  History of Present Illness: Sonya Oliver is a 71 y.o. who was initially evaluated by Dr. Sherron Monday as a hospital consult in late June for hematuria and a probable bladder mass.  She was found to also be anemic.  She was then seen by Dr. Ihor Gully in consultation and has been scheduled for cystoscopy with clot evacuation and TURBT for August 5th.  [name] presented today to the ED with an episode of syncope.  She has had persistent painless gross hematuria passing clots over the past month.  A catheter was inserted tonight. She currently is mildly hypotensive and receiving IVFs and blood transfusion. She denies flank pain, abdominal pain, dysuria, fever, nausea or vomiting.   Past Medical History  Diagnosis Date  . Hypertension   . Thyroid disease   . Diabetes mellitus     borderline diabetes  . UTI (lower urinary tract infection)   . 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 Abdominal hysterectomy      Current Hospital Medications: Scheduled Meds:   . acetaminophen  650 mg Oral Once  . cefTRIAXone (ROCEPHIN)  IV  1 g Intravenous Q24H  . diphenhydrAMINE  25 mg Oral Once  . furosemide  20 mg Intravenous Once  . furosemide  20 mg Intravenous Once  . insulin aspart  0-9 Units Subcutaneous TID WC  . insulin aspart  3 Units Subcutaneous TID WC  . methimazole  10 mg Oral Daily  . sodium chloride  3 mL Intravenous Q12H  . DISCONTD: hydrochlorothiazide  25 mg Oral Daily  . DISCONTD: insulin aspart  0-9 Units Subcutaneous TID WC  . DISCONTD: insulin aspart  0-9 Units Subcutaneous Q4H  . DISCONTD: losartan  100 mg Oral Daily  . DISCONTD: losartan-hydrochlorothiazide  1 tablet Oral Daily   Continuous Infusions:   . sodium chloride 75 mL/hr at 03/22/12 1943   PRN Meds:.HYDROcodone-acetaminophen, iohexol  Allergies: No Known  Allergies  Family History  Problem Relation Age of Onset  . Diabetes Mother   . Diabetes Father     Social History:  reports that she quit smoking about 5 years ago. Her smoking use included Cigarettes. She has a 50 pack-year smoking history. She has never used smokeless tobacco. She reports that she does not drink alcohol or use illicit drugs.  ROS: A complete review of systems was performed.  All systems are negative except for pertinent findings as noted.  Physical Exam:  Vital signs in last 24 hours: Temp:  [97.9 F (36.6 C)-99.3 F (37.4 C)] 98.5 F (36.9 C) (07/24 1456) Pulse Rate:  [64-95] 83  (07/24 1456) Resp:  [16-24] 18  (07/24 1456) BP: (82-132)/(37-81) 82/40 mmHg (07/24 1500) SpO2:  [87 %-94 %] 87 % (07/24 1456) Weight:  [91 kg (200 lb 9.9 oz)-91.853 kg (202 lb 8 oz)] 91.853 kg (202 lb 8 oz) (07/24 0431) General:  Alert and oriented, No acute distress HEENT: Normocephalic, atraumatic Neck: No JVD or lymphadenopathy Cardiovascular: Regular rate and rhythm Lungs: Clear bilaterally Abdomen: Soft, nontender, nondistended, no abdominal masses Back: No CVA tenderness Extremities: No edema Neurologic: Grossly intact  Laboratory Data:   Basename 03/22/12 1830 03/22/12 0837 03/21/12 1955  WBC 5.2 5.3 6.8  HGB 7.1* 6.9* 6.1*  HCT 22.7* 21.2* 19.3*  PLT 191 165 216     Basename 03/22/12  0981 03/21/12 1955  NA 136 133*  K 3.3* 3.5  CL 101 98  GLUCOSE 87 107*  BUN 14 16  CALCIUM 8.5 9.1  CREATININE 0.66 0.73     Results for orders placed during the hospital encounter of 03/21/12 (from the past 24 hour(s))  PREPARE RBC (CROSSMATCH)     Status: Normal   Collection Time   03/21/12  9:00 PM      Component Value Range   Order Confirmation ORDER PROCESSED BY BLOOD BANK    GLUCOSE, CAPILLARY     Status: Normal   Collection Time   03/21/12 10:11 PM      Component Value Range   Glucose-Capillary 95  70 - 99 mg/dL   Comment 1 Notify RN     Comment 2 Documented  in Chart    CARDIAC PANEL(CRET KIN+CKTOT+MB+TROPI)     Status: Normal   Collection Time   03/21/12 11:29 PM      Component Value Range   Total CK 60  7 - 177 U/L   CK, MB 1.9  0.3 - 4.0 ng/mL   Troponin I <0.30  <0.30 ng/mL   Relative Index RELATIVE INDEX IS INVALID  0.0 - 2.5  TSH     Status: Abnormal   Collection Time   03/21/12 11:29 PM      Component Value Range   TSH 0.229 (*) 0.350 - 4.500 uIU/mL  D-DIMER, QUANTITATIVE     Status: Abnormal   Collection Time   03/21/12 11:29 PM      Component Value Range   D-Dimer, Quant 3.10 (*) 0.00 - 0.48 ug/mL-FEU  APTT     Status: Normal   Collection Time   03/21/12 11:29 PM      Component Value Range   aPTT 29  24 - 37 seconds  PROTIME-INR     Status: Normal   Collection Time   03/21/12 11:29 PM      Component Value Range   Prothrombin Time 13.7  11.6 - 15.2 seconds   INR 1.03  0.00 - 1.49  GLUCOSE, CAPILLARY     Status: Abnormal   Collection Time   03/21/12 11:51 PM      Component Value Range   Glucose-Capillary 175 (*) 70 - 99 mg/dL   Comment 1 Notify RN     Comment 2 Documented in Chart    GLUCOSE, CAPILLARY     Status: Normal   Collection Time   03/22/12  4:11 AM      Component Value Range   Glucose-Capillary 99  70 - 99 mg/dL   Comment 1 Notify RN    GLUCOSE, CAPILLARY     Status: Normal   Collection Time   03/22/12  7:18 AM      Component Value Range   Glucose-Capillary 87  70 - 99 mg/dL  CARDIAC PANEL(CRET KIN+CKTOT+MB+TROPI)     Status: Normal   Collection Time   03/22/12  8:36 AM      Component Value Range   Total CK 32  7 - 177 U/L   CK, MB 1.9  0.3 - 4.0 ng/mL   Troponin I <0.30  <0.30 ng/mL   Relative Index RELATIVE INDEX IS INVALID  0.0 - 2.5  CBC     Status: Abnormal   Collection Time   03/22/12  8:37 AM      Component Value Range   WBC 5.3  4.0 - 10.5 K/uL   RBC 2.75 (*) 3.87 - 5.11 MIL/uL  Hemoglobin 6.9 (*) 12.0 - 15.0 g/dL   HCT 16.1 (*) 09.6 - 04.5 %   MCV 77.1 (*) 78.0 - 100.0 fL   MCH 25.1 (*)  26.0 - 34.0 pg   MCHC 32.5  30.0 - 36.0 g/dL   RDW 40.9 (*) 81.1 - 91.4 %   Platelets 165  150 - 400 K/uL  COMPREHENSIVE METABOLIC PANEL     Status: Abnormal   Collection Time   03/22/12  8:37 AM      Component Value Range   Sodium 136  135 - 145 mEq/L   Potassium 3.3 (*) 3.5 - 5.1 mEq/L   Chloride 101  96 - 112 mEq/L   CO2 28  19 - 32 mEq/L   Glucose, Bld 87  70 - 99 mg/dL   BUN 14  6 - 23 mg/dL   Creatinine, Ser 7.82  0.50 - 1.10 mg/dL   Calcium 8.5  8.4 - 95.6 mg/dL   Total Protein 6.2  6.0 - 8.3 g/dL   Albumin 2.7 (*) 3.5 - 5.2 g/dL   AST 12  0 - 37 U/L   ALT 6  0 - 35 U/L   Alkaline Phosphatase 51  39 - 117 U/L   Total Bilirubin 0.9  0.3 - 1.2 mg/dL   GFR calc non Af Amer 86 (*) >90 mL/min   GFR calc Af Amer >90  >90 mL/min  GLUCOSE, CAPILLARY     Status: Normal   Collection Time   03/22/12 12:40 PM      Component Value Range   Glucose-Capillary 88  70 - 99 mg/dL  CARDIAC PANEL(CRET KIN+CKTOT+MB+TROPI)     Status: Normal   Collection Time   03/22/12  3:18 PM      Component Value Range   Total CK 32  7 - 177 U/L   CK, MB 1.8  0.3 - 4.0 ng/mL   Troponin I <0.30  <0.30 ng/mL   Relative Index RELATIVE INDEX IS INVALID  0.0 - 2.5  GLUCOSE, CAPILLARY     Status: Normal   Collection Time   03/22/12  5:02 PM      Component Value Range   Glucose-Capillary 82  70 - 99 mg/dL  CBC     Status: Abnormal   Collection Time   03/22/12  6:30 PM      Component Value Range   WBC 5.2  4.0 - 10.5 K/uL   RBC 2.89 (*) 3.87 - 5.11 MIL/uL   Hemoglobin 7.1 (*) 12.0 - 15.0 g/dL   HCT 21.3 (*) 08.6 - 57.8 %   MCV 78.5  78.0 - 100.0 fL   MCH 24.6 (*) 26.0 - 34.0 pg   MCHC 31.3  30.0 - 36.0 g/dL   RDW 46.9 (*) 62.9 - 52.8 %   Platelets 191  150 - 400 K/uL  PREPARE RBC (CROSSMATCH)     Status: Normal   Collection Time   03/22/12  8:00 PM      Component Value Range   Order Confirmation ORDER PROCESSED BY BLOOD BANK     No results found for this or any previous visit (from the past 240  hour(s)).  Renal Function:  Basename 03/22/12 0837 03/21/12 1955  CREATININE 0.66 0.73   Estimated Creatinine Clearance: 72.6 ml/min (by C-G formula based on Cr of 0.66).  Radiologic Imaging: Ct Head Wo Contrast  03/21/2012  *RADIOLOGY REPORT*  Clinical Data: Syncopal episode.  CT HEAD WITHOUT CONTRAST  Technique:  Contiguous axial images  were obtained from the base of the skull through the vertex without contrast.  Comparison: None  Findings: The brain demonstrates no evidence of hemorrhage, infarction, edema, mass effect, extra-axial fluid collection, hydrocephalus or mass lesion.  The skull is unremarkable.  IMPRESSION: Normal head CT.  Original Report Authenticated By: Reola Calkins, M.D.   Ct Angio Chest W/cm &/or Wo Cm  03/22/2012  *RADIOLOGY REPORT*  Clinical Data: Elevated D-dimer.  Syncope.  Anemia.  Prior long- term tobacco use.  CT ANGIOGRAPHY CHEST  Technique:  Multidetector CT imaging of the chest using the standard protocol during bolus administration of intravenous contrast. Multiplanar reconstructed images including MIPs were obtained and reviewed to evaluate the vascular anatomy.  Contrast: OMNIPAQUE IOHEXOL 350 MG/ML SOLN  Comparison: None.  Findings: Prominent thyroid goiter noted with a densely rim- calcified 2.9 cm nodule.  The thyroid gland appears to have been previously worked up with imaging last year.  No filling defect is identified in the pulmonary arterial tree to suggest pulmonary embolus.  Aortic atherosclerotic calcification is observed.  No aortic dissection, penetrating ulcer, or acute intramural hematoma is observed. No pathologic retroperitoneal or porta hepatis adenopathy is identified.  Severe emphysema noted with evidence of old granulomatous disease. A 4 mm calcified granuloma is present posteriorly in the right upper lobe  Scarring or subsegmental atelectasis noted along the inferior margin of the left major fissure.  There is minimal scarring  peripherally in the left upper lobe on image 26 of series 7.  IMPRESSION:  1.  Severe emphysema. 2.  Old granulomatous disease. 3.  Atherosclerosis. 4.  No embolus identified. 5.  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 Follow-up thyroid ultrasound could be used for further characterization, although I do note that the patient has had prior thyroid nuclear medicine imaging the last year.  Original Report Authenticated By: Dellia Cloud, M.D.    I independently reviewed the above imaging studies.  Impression/Assessment:  Gross hematuria with anemia and probable bladder tumor  Plan:  I agree with blood transfusion and IVF hydration. Check UA and culture if indicated.  Continue Foley catheter.  I will notify Dr. Vernie Ammons of patient's admission.  He may wish to proceed with cystoscopy and TURBT considering continued blood loss and hematuria.  Dreya Buhrman,LES 03/22/2012, 8:00 PM  Moody Bruins. MD

## 2012-03-22 NOTE — Progress Notes (Signed)
*  PRELIMINARY RESULTS* Vascular Ultrasound Carotid Duplex (Doppler) has been completed.  Preliminary findings: Bilaterally increased velocities in proximal ICA in the 60-79% stenosis range. However there is only mild plaque formation. The elevated velocities could be due to curvature at proximal ICA or another factor.  Farrel Demark RDMS 03/22/2012, 9:37 AM

## 2012-03-22 NOTE — Progress Notes (Signed)
PROGRESS NOTE  Sonya Oliver:[id number] DOB: 03-11-1941 DOA: 03/21/2012 PCP: Sonya Baseman, MD  Brief narrative: [name] presented 7/23 with Hematuria, Syncope  Past medical history: Admission 6/26 for hematuria and dizzyness  Consultants:  Urologist 7/24  Procedures:  CT head 7/23 = normal head CT  CT angiogram chest 7/24= Wernicke's Sonya Oliver, old granulomatous disease, atherosclerosis, no embolus identified, large thyroid quarter extending up into the upper mediastinum followup  Sound could also be used for further characterization  Antibiotics:  None-received one dose of ceftriaxone 7/20 3 PM   Subjective  Doing well.  Currently very hungry.  No shortness of breath or chest pain Does have some dizziness at present when standing. No blurred or double vision No headaches   Objective    Interim History: Reviewed database  Subjective:   Objective: Filed Vitals:   03/22/12 0540 03/22/12 0640 03/22/12 1456 03/22/12 1500  BP: 90/44 101/52 88/43 82/40   Pulse: 64 67 83   Temp: 98 F (36.7 C) 97.9 F (36.6 C) 98.5 F (36.9 C)   TempSrc: Oral Oral Oral   Resp: 18 18 18    Height:      Weight:      SpO2:   87%     Intake/Output Summary (Last 24 hours) at 03/22/12 1703 Last data filed at 03/22/12 0425  Gross per 24 hour  Intake 1063.75 ml  Output    250 ml  Net 813.75 ml    Exam:  General: Alert pleasant oriented [name]. Cardiovascular: S1-S2 no murmur rub or gallop Respiratory: Clinically clear no added sound Abdomen: Soft nontender nondistended however the patient does have some flank pains Skin soft no edema no rash Neuro nerves grossly appear to be intact.  Strength is intact.  On Sunday patient does get dizzy.  Data Reviewed: Basic Metabolic Panel:  Lab 03/22/12 9562 03/21/12 1955  NA 136 133*  K 3.3* 3.5  CL 101 98  CO2 28 27  GLUCOSE 87 107*  BUN 14 16  CREATININE 0.66 0.73  CALCIUM 8.5 9.1  MG -- --    PHOS -- --   Liver Function Tests:  Lab 03/22/12 0837  AST 12  ALT 6  ALKPHOS 51  BILITOT 0.9  PROT 6.2  ALBUMIN 2.7*   No results found for this basename: LIPASE:5,AMYLASE:5 in the last 168 hours No results found for this basename: AMMONIA:5 in the last 168 hours CBC:  Lab 03/22/12 0837 03/21/12 1955  WBC 5.3 6.8  NEUTROABS -- --  HGB 6.9* 6.1*  HCT 21.2* 19.3*  MCV 77.1* 75.4*  PLT 165 216   Cardiac Enzymes:  Lab 03/22/12 1518 03/22/12 0836 03/21/12 2329  CKTOTAL 32 32 60  CKMB 1.8 1.9 1.9  CKMBINDEX -- -- --  TROPONINI <0.30 <0.30 <0.30   BNP: No components found with this basename: POCBNP:5 CBG:  Lab 03/22/12 1240 03/22/12 0718 03/22/12 0411 03/21/12 2351 03/21/12 2211  GLUCAP 88 87 99 175* 95    No results found for this or any previous visit (from the past 240 hour(s)).   Studies:              All Imaging reviewed and is as per above notation   Scheduled Meds:   . cefTRIAXone (ROCEPHIN)  IV  1 g Intravenous Q24H  . losartan  100 mg Oral Daily   And  . hydrochlorothiazide  25 mg Oral Daily  . insulin aspart  0-9 Units Subcutaneous Q4H  . methimazole  10 mg Oral Daily  . sodium chloride  3 mL Intravenous Q12H  . DISCONTD: insulin aspart  0-9 Units Subcutaneous TID WC  . DISCONTD: losartan-hydrochlorothiazide  1 tablet Oral Daily   Continuous Infusions:   . sodium chloride 75 mL/hr at 03/21/12 2330     Assessment/Plan: 1. Gross hematuria, ? Mass-spoke with Dr. Laverle Oliver of urology regarding case-recommends Foley catheter.  Appreciate assistance in advance regarding workup in hospital.  Explained to family patient may get some imaging vs. procedure this time 2. Hypotension-multifactorial and possibly secondary to losartan 100 mg daily, HCTZ 25 mg daily and anemia blood loss as per above.  Discontinue both hypertensive agents and placed on hydralazine when necessary 3. Diet controlled Diabetes-blood sugars 87-88 range.  As we are allowing her to heat,  will discontinue every 4 coverage and placed on 4 times a day at bedtime coverage 4. Presyncopal event-echo negative, CT scan had negative-clinically not cerebrovascular disease.  Would not workup any further. 5. ?  Thyroid cancer-continue methimazole  Code Status: Full Family Communication: Discussed with grandson and patient in room Disposition Plan: Inpatient   Sonya Koch, MD  Sonya Oliver Pager [phone] 03/22/2012, 5:03 PM    LOS: 1 day

## 2012-03-23 ENCOUNTER — Encounter (HOSPITAL_COMMUNITY): Payer: Self-pay | Admitting: Anesthesiology

## 2012-03-23 ENCOUNTER — Inpatient Hospital Stay (HOSPITAL_COMMUNITY): Payer: Medicare Other | Admitting: Anesthesiology

## 2012-03-23 ENCOUNTER — Encounter (HOSPITAL_COMMUNITY)
Admission: EM | Disposition: A | Discharge status: Home / Self Care | Payer: Self-pay | Source: Home / Self Care | Attending: Family Medicine

## 2012-03-23 DIAGNOSIS — R55 Syncope and collapse: Secondary | ICD-10-CM

## 2012-03-23 DIAGNOSIS — I959 Hypotension, unspecified: Secondary | ICD-10-CM

## 2012-03-23 DIAGNOSIS — M79609 Pain in unspecified limb: Secondary | ICD-10-CM

## 2012-03-23 HISTORY — PX: TRANSURETHRAL RESECTION OF BLADDER TUMOR: SHX2575

## 2012-03-23 HISTORY — PX: CYSTOSCOPY: SHX5120

## 2012-03-23 LAB — CBC
HCT: 25.5 % — ABNORMAL LOW (ref 36.0–46.0)
Hemoglobin: 8.4 g/dL — ABNORMAL LOW (ref 12.0–15.0)
MCH: 26 pg (ref 26.0–34.0)
MCHC: 32.9 g/dL (ref 30.0–36.0)
MCV: 80.5 fL (ref 78.0–100.0)
Platelets: 159 10*3/uL (ref 150–400)
RBC: 3.23 MIL/uL — ABNORMAL LOW (ref 3.87–5.11)
RDW: 16.9 % — ABNORMAL HIGH (ref 11.5–15.5)
WBC: 6.4 10*3/uL (ref 4.0–10.5)

## 2012-03-23 LAB — GLUCOSE, CAPILLARY
Glucose-Capillary: 82 mg/dL (ref 70–99)
Glucose-Capillary: 83 mg/dL (ref 70–99)
Glucose-Capillary: 59 mg/dL — ABNORMAL LOW (ref 70–99)
Glucose-Capillary: 92 mg/dL (ref 70–99)

## 2012-03-23 LAB — TYPE AND SCREEN
ABO/RH(D): A NEG
Antibody Screen: NEGATIVE
Unit division: 0
Unit division: 0
Unit division: 0
Unit division: 0

## 2012-03-23 LAB — SURGICAL PCR SCREEN
MRSA, PCR: NEGATIVE
Staphylococcus aureus: POSITIVE — AB

## 2012-03-23 SURGERY — CYSTOSCOPY
Anesthesia: General | Wound class: Clean Contaminated

## 2012-03-23 MED ORDER — LACTATED RINGERS IV SOLN
INTRAVENOUS | Status: DC | PRN
  Administered 2012-03-23: 15:00:00 via INTRAVENOUS

## 2012-03-23 MED ORDER — LACTATED RINGERS IV SOLN: INTRAVENOUS | Status: DC

## 2012-03-23 MED ORDER — DARIFENACIN HYDROBROMIDE ER 15 MG PO TB24
15.0000 mg | ORAL_TABLET | Freq: Every day | ORAL | Status: DC
  Administered 2012-03-23: 15 mg via ORAL
  Filled 2012-03-23 (×2): qty 1

## 2012-03-23 MED ORDER — STERILE WATER FOR IRRIGATION IR SOLN
Status: DC | PRN
  Administered 2012-03-23: 500 mL

## 2012-03-23 MED ORDER — FENTANYL CITRATE 0.05 MG/ML IJ SOLN
INTRAMUSCULAR | Status: DC | PRN
  Administered 2012-03-23 (×2): 25 ug via INTRAVENOUS
  Administered 2012-03-23: 50 ug via INTRAVENOUS
  Administered 2012-03-23 (×2): 25 ug via INTRAVENOUS

## 2012-03-23 MED ORDER — ONDANSETRON HCL 4 MG/2ML IJ SOLN
INTRAMUSCULAR | Status: DC | PRN
  Administered 2012-03-23: 4 mg via INTRAVENOUS

## 2012-03-23 MED ORDER — PROPOFOL 10 MG/ML IV EMUL
INTRAVENOUS | Status: DC | PRN
  Administered 2012-03-23: 150 mg via INTRAVENOUS

## 2012-03-23 MED ORDER — HYDROMORPHONE HCL PF 1 MG/ML IJ SOLN: 0.5000 mg | INTRAMUSCULAR | Status: DC | PRN

## 2012-03-23 MED ORDER — IOHEXOL 300 MG/ML  SOLN
INTRAMUSCULAR | Status: DC | PRN
  Administered 2012-03-23: 50 mL

## 2012-03-23 MED ORDER — MEPERIDINE HCL 50 MG/ML IJ SOLN: 6.2500 mg | INTRAMUSCULAR | Status: DC | PRN

## 2012-03-23 MED ORDER — FENTANYL CITRATE 0.05 MG/ML IJ SOLN
25.0000 ug | INTRAMUSCULAR | Status: DC | PRN
  Administered 2012-03-23: 50 ug via INTRAVENOUS

## 2012-03-23 MED ORDER — PROMETHAZINE HCL 25 MG/ML IJ SOLN: 6.2500 mg | INTRAMUSCULAR | Status: DC | PRN

## 2012-03-23 MED ORDER — OXYCODONE-ACETAMINOPHEN 5-325 MG PO TABS: 1.0000 | ORAL_TABLET | ORAL | Status: DC | PRN

## 2012-03-23 MED ORDER — 0.9 % SODIUM CHLORIDE (POUR BTL) OPTIME
TOPICAL | Status: DC | PRN
  Administered 2012-03-23: 2000 mL

## 2012-03-23 MED ORDER — SODIUM CHLORIDE 0.9 % IR SOLN
Status: DC | PRN
  Administered 2012-03-23: 18000 mL

## 2012-03-23 MED ORDER — DEXTROSE 50 % IV SOLN
25.0000 mL | Freq: Once | INTRAVENOUS | Status: AC | PRN
  Administered 2012-03-23: 25 mL via INTRAVENOUS

## 2012-03-23 MED ORDER — LIDOCAINE HCL (CARDIAC) 20 MG/ML IV SOLN
INTRAVENOUS | Status: DC | PRN
  Administered 2012-03-23: 60 mg via INTRAVENOUS

## 2012-03-23 MED ORDER — CIPROFLOXACIN IN D5W 400 MG/200ML IV SOLN
400.0000 mg | INTRAVENOUS | Status: AC
  Administered 2012-03-23: 400 mg via INTRAVENOUS
  Filled 2012-03-23: qty 200

## 2012-03-23 MED ORDER — SODIUM CHLORIDE 0.9 % IR SOLN
3000.0000 mL | Status: DC
  Administered 2012-03-23: 3000 mL

## 2012-03-23 SURGICAL SUPPLY — 23 items
BAG URINE DRAINAGE (UROLOGICAL SUPPLIES) IMPLANT
BAG URO CATCHER STRL LF (DRAPE) ×2 IMPLANT
CATH HEMA 3WAY 30CC 24FR COUDE (CATHETERS) ×2 IMPLANT
CATH INTERMIT  6FR 70CM (CATHETERS) ×2 IMPLANT
CATH ROBINSON RED A/P 16FR (CATHETERS) IMPLANT
CLOTH BEACON ORANGE TIMEOUT ST (SAFETY) ×2 IMPLANT
DRAPE CAMERA CLOSED 9X96 (DRAPES) ×2 IMPLANT
ELECT LOOP MED HF 24F 12D CBL (CLIP) ×4 IMPLANT
ELECT REM PT RETURN 9FT ADLT (ELECTROSURGICAL) ×2
ELECTRODE REM PT RTRN 9FT ADLT (ELECTROSURGICAL) ×1 IMPLANT
EVACUATOR MICROVAS BLADDER (UROLOGICAL SUPPLIES) ×2 IMPLANT
GLOVE BIOGEL M 8.0 STRL (GLOVE) ×2 IMPLANT
GOWN PREVENTION PLUS XLARGE (GOWN DISPOSABLE) ×2 IMPLANT
GOWN STRL REIN XL XLG (GOWN DISPOSABLE) ×2 IMPLANT
HOLDER FOLEY CATH W/STRAP (MISCELLANEOUS) ×2 IMPLANT
JUMPSUIT BLUE BOOT COVER DISP (PROTECTIVE WEAR) IMPLANT
KIT ASPIRATION TUBING (SET/KITS/TRAYS/PACK) ×2 IMPLANT
LOOPS RESECTOSCOPE DISP (ELECTROSURGICAL) IMPLANT
MANIFOLD NEPTUNE II (INSTRUMENTS) ×2 IMPLANT
NS IRRIG 1000ML POUR BTL (IV SOLUTION) ×2 IMPLANT
PACK CYSTO (CUSTOM PROCEDURE TRAY) ×2 IMPLANT
SYRINGE IRR TOOMEY STRL 70CC (SYRINGE) ×2 IMPLANT
TUBING CONNECTING 10 (TUBING) ×2 IMPLANT

## 2012-03-23 NOTE — Progress Notes (Signed)
Patient ID: Sonya Oliver, female   DOB: 1941-06-10, 71 y.o.   MRN: [id number]  Subjective: Patient reports No new urologic complaints. She continues to have gross hematuria.  Objective: Vital signs in last 24 hours: Temp:  [97.4 F (36.3 C)-98.6 F (37 C)] 98.1 F (36.7 C) (07/25 0522) Pulse Rate:  [60-83] 68  (07/25 0522) Resp:  [16-18] 18  (07/25 0522) BP: (82-146)/(40-81) 116/55 mmHg (07/25 0522) SpO2:  [87 %-100 %] 92 % (07/25 0522) Weight:  [92.352 kg (203 lb 9.6 oz)] 92.352 kg (203 lb 9.6 oz) (07/25 0522)A  Intake/Output from previous day: 07/24 0701 - 07/25 0700 In: 3690 [P.O.:240; I.V.:600] Out: 475 [Urine:475] Intake/Output this shift:    Past Medical History  Diagnosis Date  . Hypertension   . Thyroid disease   . Diabetes mellitus     borderline diabetes  . UTI (lower urinary tract infection)   . Bladder tumor 02/23/2012    on CT scan     Physical Exam:  General:Awake and alert Lungs - Normal respiratory effort, chest expands symmetrically.  Abdomen - Soft, non-tender & non-distended. Foley catheter - draining bloody urine, no clots.  Lab Results:  Basename 03/23/12 0322 03/22/12 1830 03/22/12 0837  WBC 6.0 5.2 5.3  HGB 8.4* 7.1* 6.9*  HCT 25.5* 22.7* 21.2*   BMET  Basename 03/22/12 0837 03/21/12 1955  NA 136 133*  K 3.3* 3.5  CL 101 98  CO2 28 27  GLUCOSE 87 107*  BUN 14 16  CREATININE 0.66 0.73  CALCIUM 8.5 9.1   No results found for this basename: LABURIN:1 in the last 72 hours Results for orders placed during the hospital encounter of 02/23/12  URINE CULTURE     Status: Normal   Collection Time   02/23/12  6:14 PM      Component Value Range Status Comment   Specimen Description URINE, RANDOM   Final    Special Requests NONE   Final    Culture  Setup Time [id number]   Final    Colony Count NO GROWTH   Final    Culture NO GROWTH   Final    Report Status 02/25/2012 FINAL   Final      Studies/Results: @RISRSLT24 @  Assessment/Plan: This patient has a bladder mass and has had persistent gross hematuria with resultant blood loss anemia and syncope. She was scheduled for elective cystoscopy with clot evacuation and resection of her bladder tumor however she has now been admitted to the hospital and I have recommended to her that we proceed with the above-noted procedure today. I went over the procedure with her again in detail including its risks and complications and alternatives. We discussed the probability of success. All of her questions have been answered and she has elected to proceed.  1. N.P.O. 2.Cystoscopy with bilateral retrograde pyelograms, evacuation of clots and transurethral resection of her bladder tumor this afternoon.  Sathvika Ojo C 03/23/2012, 7:09 AM

## 2012-03-23 NOTE — Preoperative (Signed)
Beta Blockers   Reason not to administer Beta Blockers:Not Applicable 

## 2012-03-23 NOTE — Discharge Instructions (Signed)

## 2012-03-23 NOTE — Anesthesia Preprocedure Evaluation (Addendum)
Anesthesia Evaluation  Patient identified by MRN, date of birth, ID band Patient awake    Reviewed: Allergy & Precautions, H&P , NPO status , Patient's Chart, lab work & pertinent test results  Airway Mallampati: II TM Distance: >3 FB Neck ROM: Full    Dental No notable dental hx. (+) Edentulous Upper and Upper Dentures   Pulmonary neg pulmonary ROS,  breath sounds clear to auscultation  Pulmonary exam normal       Cardiovascular hypertension, Pt. on medications negative cardio ROS  Rhythm:Regular Rate:Normal     Neuro/Psych negative neurological ROS  negative psych ROS   GI/Hepatic negative GI ROS, Neg liver ROS,   Endo/Other  negative endocrine ROSInsulin Dependent  Renal/GU negative Renal ROS  negative genitourinary   Musculoskeletal negative musculoskeletal ROS (+)   Abdominal   Peds negative pediatric ROS (+)  Hematology negative hematology ROS (+)   Anesthesia Other Findings   Reproductive/Obstetrics negative OB ROS                           Anesthesia Physical Anesthesia Plan  ASA: II  Anesthesia Plan: General   Post-op Pain Management:    Induction: Intravenous  Airway Management Planned: LMA  Additional Equipment:   Intra-op Plan:   Post-operative Plan:   Informed Consent: I have reviewed the patients History and Physical, chart, labs and discussed the procedure including the risks, benefits and alternatives for the proposed anesthesia with the patient or authorized representative who has indicated his/her understanding and acceptance.   Dental advisory given  Plan Discussed with: CRNA  Anesthesia Plan Comments:         Anesthesia Quick Evaluation

## 2012-03-23 NOTE — Transfer of Care (Signed)
Immediate Anesthesia Transfer of Care Note  Patient: LARRAINE ARGO  Procedure(s) Performed: Procedure(s) (LRB): CYSTOSCOPY (N/A) TRANSURETHRAL RESECTION OF BLADDER TUMOR (TURBT) (N/A)  Patient Location: PACU  Anesthesia Type: General  Level of Consciousness: awake, alert , oriented and patient cooperative  Airway & Oxygen Therapy: Patient Spontanous Breathing and Patient connected to face mask oxygen  Post-op Assessment: Report given to PACU RN, Post -op Vital signs reviewed and stable and Patient moving all extremities X 4  Post vital signs: Reviewed and stable  Complications: No apparent anesthesia complications

## 2012-03-23 NOTE — Progress Notes (Signed)
*  PRELIMINARY RESULTS* Vascular Ultrasound Lower extremity venous duplex has been completed.  Preliminary findings: Bilaterally no evidence of DVT. Right Baker's cyst noted.  Farrel Demark RDMS 03/23/2012, 2:36 PM

## 2012-03-23 NOTE — Op Note (Signed)
PATIENT:  Sonya Oliver  PRE-OPERATIVE DIAGNOSIS: 1. Gross hematuria 2. Bladder tumor  POST-OPERATIVE DIAGNOSIS: Same  PROCEDURE:  Procedure(s): 1. Cystoscopy with evacuation of clots. 2.TRANSURETHRAL RESECTION OF BLADDER TUMOR (TURBT) (7cm.)  SURGEON:  Surgeon(s): Garnett Farm  ANESTHESIA:   General  EBL:  Approximately 75 cc  DRAINS: Urinary Catheter (24 [place] three-way hematuria catheter)   SPECIMEN:  Source of Specimen:  Bladder tumor  DISPOSITION OF SPECIMEN:  PATHOLOGY  Indication: Sonya Oliver was seen initially as a hospital consultation for gross hematuria and a CT scan revealed a large mass within the bladder. I scheduled her for cystoscopy and clot evacuation after she been discharged with plans at that time to resect any bladder tumor that was identified as I felt that this was most likely the cause of her gross hematuria. She developed syncope and was admitted to the hospital with blood loss anemia. She was transfused and I therefore elected to proceed with cystoscopy and clot evacuation at this time.  Description of operation: The patient was taken to the operating room and administered general anesthesia. He was then placed on the table and moved to the dorsal lithotomy position after which his genitalia was sterilely prepped and draped. An official timeout was then performed.  The 26 French resectoscope with Timberlake obturator was then introduced into the bladder and the obturator was removed. The resectoscope element with 12 lens was then inserted and the bladder was fully and systematically inspected. There was a large amount of clot in the bladder as well. I therefore used both the Microvasive evacuator and then a Toomey syringe to evacuate all the clot from the bladder.  Reinspection after all the clot had been removed revealed the ureteral orifices could not be identified due to the presence of extensive bladder cancer involving the trigone, floor of the  bladder on the right side extending posteriorly and across the midline involving most of the trigone as well as the left posterior and inferior wall of the bladder.  The largest bulk of the tumor was on the right-hand side although there was extensive bladder cancer on the left as well. I didn't see any cancer involving the bladder neck.  I first began by resecting the tumor on the right-hand side. I resected down to the wall of the bladder. Bleeding points were cauterized as I was resecting. I then resected across the midline posteriorly and the tumor on the left side. At no point was I able to identify the ureteral orifice although her preoperative CT revealed no hydronephrosis. Reinspection of the bladder revealed all obvious tumor had been fully resected and there was no evidence of perforation. The Microvasive evacuator was then used to irrigate the bladder and remove all of the portions of bladder tumor which were sent to pathology. I then removed the resectoscope.  A 24 French three-way Foley catheter was then inserted in the bladder and irrigated. The irrigant returned slightly pink with no clots. The patient was awakened and taken to the recovery room.  PLAN OF CARE: Discharge to home after PACU  PATIENT DISPOSITION:  PACU - hemodynamically stable.

## 2012-03-23 NOTE — Progress Notes (Signed)
Urine noted to have blood clots in it. Pt stated she felt the urge to urinate. Night coverage notified. Order received to irrigate foley prn. Foley irrigated with NS. Foley draining well. Pt stated her bladder no longer felt full. Will continue to monitor and irrigate as needed.

## 2012-03-23 NOTE — Progress Notes (Signed)
PROGRESS NOTE  Sonya Oliver ZOX:[id number] DOB: [birth year] DOA: 03/21/2012 PCP: [name], MD  Brief narrative: [name] presented 7/23 with Hematuria, Syncope  Past medical history: Admission 6/26 for hematuria and dizzyness  Consultants:  Urologist 7/24  Procedures:  CT head 7/23 = normal head CT  CT angiogram chest 7/24= Wernicke's [place], old granulomatous disease, atherosclerosis, no embolus identified, large thyroid quarter extending up into the upper mediastinum followup  Sound could also be used for further characterization  Antibiotics:  None-received one dose of ceftriaxone 7/20 3 PM   Subjective  Doing well.    No shortness of breath or chest pain Does have some dizziness at present when standing. No blurred or double vision No headaches Still draining bloody urine    Objective    Interim History: Reviewed database-Noted plans for surgery this pm  Subjective:   Objective: Filed Vitals:   03/22/12 2310 03/23/12 0010 03/23/12 0110 03/23/12 0522  BP: 146/68 130/81 113/67 116/55  Pulse: 71 60 65 68  Temp: 97.4 F (36.3 C) 98 F (36.7 C) 98.3 F (36.8 C) 98.1 F (36.7 C)  TempSrc: Oral Oral Oral Oral  Resp: 16 18 18 18   Height:      Weight:    92.352 kg (203 lb 9.6 oz)  SpO2:    92%    Intake/Output Summary (Last 24 hours) at 03/23/12 1316 Last data filed at 03/23/12 0522  Gross per 24 hour  Intake   3690 ml  Output    475 ml  Net   3215 ml    Exam:  General: Alert pleasant oriented [name]. Cardiovascular: S1-S2 no murmur rub or gallop Respiratory: Clinically clear no added sound Abdomen: Soft nontender nondistended however the patient does have some flank pains Skin soft no edema.  Significant tenderness L>R Lower extremity with Rash over outer lateral aspect of LLE Neuro nerves grossly appear to be intact.  Strength is intact.    Data Reviewed: Basic Metabolic Panel:  Lab 03/22/12 8119 03/21/12  1955  NA 136 133*  K 3.3* 3.5  CL 101 98  CO2 28 27  GLUCOSE 87 107*  BUN 14 16  CREATININE 0.66 0.73  CALCIUM 8.5 9.1  MG -- --  PHOS -- --   Liver Function Tests:  Lab 03/22/12 0837  AST 12  ALT 6  ALKPHOS 51  BILITOT 0.9  PROT 6.2  ALBUMIN 2.7*   No results found for this basename: LIPASE:5,AMYLASE:5 in the last 168 hours No results found for this basename: AMMONIA:5 in the last 168 hours CBC:  Lab 03/23/12 0322 03/22/12 1830 03/22/12 0837 03/21/12 1955  WBC 6.0 5.2 5.3 6.8  NEUTROABS -- -- -- --  HGB 8.4* 7.1* 6.9* 6.1*  HCT 25.5* 22.7* 21.2* 19.3*  MCV 79.7 78.5 77.1* 75.4*  PLT 167 191 165 216   Cardiac Enzymes:  Lab 03/22/12 1518 03/22/12 0836 03/21/12 2329  CKTOTAL 32 32 60  CKMB 1.8 1.9 1.9  CKMBINDEX -- -- --  TROPONINI <0.30 <0.30 <0.30   BNP: No components found with this basename: POCBNP:5 CBG:  Lab 03/23/12 1128 03/23/12 0712 03/22/12 2119 03/22/12 1702 03/22/12 1240  GLUCAP 83 82 103* 82 88    No results found for this or any previous visit (from the past 240 hour(s)).   Studies:              All Imaging reviewed and is as per above notation   Scheduled Meds:    .  acetaminophen  650 mg Oral Once  . cefTRIAXone (ROCEPHIN)  IV  1 g Intravenous Q24H  . diphenhydrAMINE  25 mg Oral Once  . furosemide  20 mg Intravenous Once  . furosemide  20 mg Intravenous Once  . insulin aspart  0-9 Units Subcutaneous TID WC  . insulin aspart  3 Units Subcutaneous TID WC  . methimazole  10 mg Oral Daily  . sodium chloride  3 mL Intravenous Q12H  . DISCONTD: hydrochlorothiazide  25 mg Oral Daily  . DISCONTD: insulin aspart  0-9 Units Subcutaneous Q4H  . DISCONTD: losartan  100 mg Oral Daily   Continuous Infusions:    . sodium chloride 75 mL/hr at 03/22/12 1943     Assessment/Plan: 1. Gross hematuria, ? Mass- Appreciate assistance of Urology-For OR today for elective cystoscopy with clot evacuation and resection of her bladder tumor  2. Anemia  of acute blood loss-S/p 4 units transfusion so far.  Hb now 8.4 3. Hypotension-multifactorial and possibly secondary to losartan 100 mg daily, HCTZ 25 mg daily and anemia blood loss as per above.  Discontinue both hypertensive agents and placed on hydralazine when necessary 4. Diet controlled Diabetes-blood sugars 87-88 range.  As we are allowing her to heat, will discontinue every 4 coverage and placed on 4 times a day at bedtime coverage 5. Presyncopal event-echo negative, CT scan had negative-clinically not cerebrovascular disease.  Would not workup any further. 6. ?  Thyroid cancer-continue methimazole-Will defer management at this time to out-patient physician  Code Status: Full Family Communication: Discussed with Daughter and patient in room Disposition Plan: Inpatient   Pleas Koch, MD  [facility] Pager [phone] 03/23/2012, 1:16 PM    LOS: 2 days

## 2012-03-23 NOTE — Anesthesia Postprocedure Evaluation (Signed)
  Anesthesia Post-op Note  Patient: Sonya Oliver  Procedure(s) Performed: Procedure(s) (LRB): CYSTOSCOPY (N/A) TRANSURETHRAL RESECTION OF BLADDER TUMOR (TURBT) (N/A)  Patient Location: PACU  Anesthesia Type: General  Level of Consciousness: awake and alert   Airway and Oxygen Therapy: Patient Spontanous Breathing  Post-op Pain: mild  Post-op Assessment: Post-op Vital signs reviewed, Patient's Cardiovascular Status Stable, Respiratory Function Stable, Patent Airway and No signs of Nausea or vomiting  Post-op Vital Signs: stable  Complications: No apparent anesthesia complications

## 2012-03-23 NOTE — OR Nursing (Addendum)
CBG: 59  Treatment: D50 IV 25 mL  Symptoms: None  Follow-up CBG: Time:1655 CBG Result:111  Possible Reasons for Event: Other: Post op/ NPO  Comments/MD notified: Dr Tharon Aquas, Rae Roam

## 2012-03-24 ENCOUNTER — Encounter (HOSPITAL_COMMUNITY): Payer: Self-pay | Admitting: Urology

## 2012-03-24 LAB — GLUCOSE, CAPILLARY: Glucose-Capillary: 127 mg/dL — ABNORMAL HIGH (ref 70–99)

## 2012-03-24 LAB — HEMOGLOBIN AND HEMATOCRIT, BLOOD
HCT: 25.3 % — ABNORMAL LOW (ref 36.0–46.0)
Hemoglobin: 8.2 g/dL — ABNORMAL LOW (ref 12.0–15.0)

## 2012-03-24 MED ORDER — PHENAZOPYRIDINE HCL 200 MG PO TABS: 200.0000 mg | ORAL_TABLET | Freq: Three times a day (TID) | ORAL | Status: AC | PRN

## 2012-03-24 MED ORDER — HYDROCODONE-ACETAMINOPHEN 10-325 MG PO TABS: 1.0000 | ORAL_TABLET | ORAL | Status: AC | PRN

## 2012-03-24 NOTE — Progress Notes (Signed)
Patient discharged home with daughter, alert and oriented, discharge instructions given, patient verbalize understanding of discharge instructions given, patient in stable condition at this time 

## 2012-03-24 NOTE — Discharge Summary (Signed)
Physician Discharge Summary  Patient ID: Sonya Oliver MRN: [id number] DOB/AGE: September 23, 1940 71 y.o.  Admit date: 03/21/2012 Discharge date: 03/24/2012  Admission Diagnoses: 1. Acute blood loss anemia 2. Syncope secondary to #1. 3. Bladder tumor  Discharge Diagnoses:  1. 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 Acute blood loss anemia. 3. Syncope secondary to acute blood loss  Discharged Condition: good  Hospital Course: The patient was admitted after having developed a syncopal episode. She been seen previously in the hospital and found to have a large mass in her bladder felt to be a clot and most likely bladder cancer as the cause of her gross hematuria. She was seen and scheduled for elective transurethral resection of her bladder tumor with evacuation of bladder clots but continued to have gross hematuria. When she presented to the emergency room after her syncopal episode she was found to have a hemoglobin of 6.1. She was therefore admitted to the hospital.  During her hospitalization she received 2 units of blood and continued to have gross hematuria. She was seen by my partner and felt to have continued bleeding from her bladder. She also had complained of lower extremity pain and underwent Doppler ultrasound of her lower [place]'s. This ruled out a DVT. It was my feeling that because of the persisting hematuria that during this hospitalization she should undergo cystoscopy with clot evacuation and resection of any bladder tumor found at that time. She therefore was taken to the operating room on 03/23/12 and had multiple clots evacuated from her bladder. That revealed extensive tumor involving the floor of the bladder primarily on the right-hand side but involving the trigone and crossing over the left-hand side as well. I was able to resect all visible tumor at the time of surgery but was unable to identify the ureteral orifices. She was maintained on continuous  bladder irrigation and her urine remained nearly clear with just a slight pink tinge but no clots. I therefore felt she was ready for discharge and her hemoglobin had remained stable.   Discharge Exam: Blood pressure 120/69, pulse 76, temperature 98 F (36.7 C), temperature source Oral, resp. rate 18, height 5\' 6"  (1.676 m), weight 92.352 kg (203 lb 9.6 oz), SpO2 95.00%. Her abdomen remained soft and nontender. Her Foley catheter is in place and draining nearly clear urine.  Disposition: 01-Home or Self Care  Discharge Orders    Future Orders Please Complete By Expires   Foley catheter - discontinue      Discontinue IV        Medication List  As of 03/24/2012  7:13 AM   TAKE these medications         HYDROcodone-acetaminophen 10-325 MG per tablet   Commonly known as: NORCO   Take 1-2 tablets by mouth every 4 (four) hours as needed for pain.      losartan-hydrochlorothiazide 100-25 MG per tablet   Commonly known as: HYZAAR   Take 1 tablet by mouth daily.      methimazole 10 MG tablet   Commonly known as: TAPAZOLE   Take 10 mg by mouth daily.      phenazopyridine 200 MG tablet   Commonly known as: PYRIDIUM   Take 1 tablet (200 mg total) by mouth 3 (three) times daily as needed for pain.           Follow-up Information    Call Garnett Farm, MD. (Call the office when you get home to schedule your followup appointment with  me in 7-10 days.)    Contact information:   [address] [place] [address] [phone]          Signed: Garnett Farm 03/24/2012, 7:13 AM

## 2012-04-03 ENCOUNTER — Encounter (HOSPITAL_BASED_OUTPATIENT_CLINIC_OR_DEPARTMENT_OTHER): Admission: RE | Payer: Self-pay | Source: Ambulatory Visit

## 2012-04-03 ENCOUNTER — Ambulatory Visit (HOSPITAL_BASED_OUTPATIENT_CLINIC_OR_DEPARTMENT_OTHER): Admission: RE | Admit: 2012-04-03 | Payer: Medicare Other | Source: Ambulatory Visit | Admitting: Urology

## 2012-04-03 SURGERY — CYSTOSCOPY, WITH RETROGRADE PYELOGRAM
Anesthesia: General

## 2012-09-12 ENCOUNTER — Inpatient Hospital Stay (HOSPITAL_COMMUNITY)
Admission: EM | Admit: 2012-09-12 | Discharge: 2012-09-16 | DRG: 293 | Disposition: A | Discharge status: Home / Self Care | Payer: Medicare Other | Attending: Internal Medicine | Admitting: Internal Medicine

## 2012-09-12 ENCOUNTER — Emergency Department (HOSPITAL_COMMUNITY): Payer: Medicare Other

## 2012-09-12 ENCOUNTER — Encounter (HOSPITAL_COMMUNITY): Payer: Self-pay | Admitting: Physical Medicine and Rehabilitation

## 2012-09-12 DIAGNOSIS — I10 Essential (primary) hypertension: Secondary | ICD-10-CM | POA: Diagnosis present

## 2012-09-12 DIAGNOSIS — R600 Localized edema: Secondary | ICD-10-CM

## 2012-09-12 DIAGNOSIS — R310 Gross hematuria: Secondary | ICD-10-CM

## 2012-09-12 DIAGNOSIS — Z87891 Personal history of nicotine dependence: Secondary | ICD-10-CM

## 2012-09-12 DIAGNOSIS — D62 Acute posthemorrhagic anemia: Secondary | ICD-10-CM

## 2012-09-12 DIAGNOSIS — Z79899 Other long term (current) drug therapy: Secondary | ICD-10-CM

## 2012-09-12 DIAGNOSIS — I951 Orthostatic hypotension: Secondary | ICD-10-CM

## 2012-09-12 DIAGNOSIS — I509 Heart failure, unspecified: Secondary | ICD-10-CM | POA: Diagnosis present

## 2012-09-12 DIAGNOSIS — R0602 Shortness of breath: Secondary | ICD-10-CM

## 2012-09-12 DIAGNOSIS — E0590 Thyrotoxicosis, unspecified without thyrotoxic crisis or storm: Secondary | ICD-10-CM | POA: Diagnosis present

## 2012-09-12 DIAGNOSIS — Z862 Personal history of diseases of the blood and blood-forming organs and certain disorders involving the immune mechanism: Secondary | ICD-10-CM | POA: Diagnosis present

## 2012-09-12 DIAGNOSIS — J449 Chronic obstructive pulmonary disease, unspecified: Secondary | ICD-10-CM | POA: Diagnosis present

## 2012-09-12 DIAGNOSIS — J4489 Other specified chronic obstructive pulmonary disease: Secondary | ICD-10-CM | POA: Diagnosis present

## 2012-09-12 DIAGNOSIS — R609 Edema, unspecified: Secondary | ICD-10-CM

## 2012-09-12 DIAGNOSIS — R55 Syncope and collapse: Secondary | ICD-10-CM

## 2012-09-12 DIAGNOSIS — J811 Chronic pulmonary edema: Secondary | ICD-10-CM

## 2012-09-12 DIAGNOSIS — D649 Anemia, unspecified: Secondary | ICD-10-CM

## 2012-09-12 DIAGNOSIS — E119 Type 2 diabetes mellitus without complications: Secondary | ICD-10-CM

## 2012-09-12 DIAGNOSIS — N3289 Other specified disorders of bladder: Secondary | ICD-10-CM | POA: Diagnosis present

## 2012-09-12 DIAGNOSIS — I5033 Acute on chronic diastolic (congestive) heart failure: Principal | ICD-10-CM | POA: Diagnosis present

## 2012-09-12 DIAGNOSIS — Z833 Family history of diabetes mellitus: Secondary | ICD-10-CM

## 2012-09-12 HISTORY — DX: Unspecified osteoarthritis, unspecified site: M19.90

## 2012-09-12 HISTORY — DX: Heart failure, unspecified: I50.9

## 2012-09-12 LAB — COMPREHENSIVE METABOLIC PANEL
AST: 18 U/L (ref 0–37)
Albumin: 3 g/dL — ABNORMAL LOW (ref 3.5–5.2)
Calcium: 8.9 mg/dL (ref 8.4–10.5)
Creatinine, Ser: 0.58 mg/dL (ref 0.50–1.10)
GFR calc non Af Amer: 89 mL/min — ABNORMAL LOW (ref >90)
Sodium: 136 mEq/L (ref 135–145)
Total Protein: 8.2 g/dL (ref 6.0–8.3)

## 2012-09-12 LAB — CBC
MCH: 18 pg — ABNORMAL LOW (ref 26.0–34.0)
MCHC: 28.5 g/dL — ABNORMAL LOW (ref 30.0–36.0)
MCV: 63.2 fL — ABNORMAL LOW (ref 78.0–100.0)
Platelets: 157 10*3/uL (ref 150–400)
RDW: 23.2 % — ABNORMAL HIGH (ref 11.5–15.5)

## 2012-09-12 LAB — POCT I-STAT TROPONIN I

## 2012-09-12 LAB — GLUCOSE, CAPILLARY: Glucose-Capillary: 162 mg/dL — ABNORMAL HIGH (ref 70–99)

## 2012-09-12 MED ORDER — INSULIN ASPART 100 UNIT/ML ~~LOC~~ SOLN
0.0000 [IU] | Freq: Three times a day (TID) | SUBCUTANEOUS | Status: DC
  Administered 2012-09-15 – 2012-09-16 (×2): 2 [IU] via SUBCUTANEOUS

## 2012-09-12 MED ORDER — SODIUM CHLORIDE 0.9 % IV SOLN: 250.0000 mL | INTRAVENOUS | Status: DC | PRN

## 2012-09-12 MED ORDER — ONDANSETRON HCL 4 MG/2ML IJ SOLN: 4.0000 mg | Freq: Four times a day (QID) | INTRAMUSCULAR | Status: DC | PRN

## 2012-09-12 MED ORDER — ENOXAPARIN SODIUM 40 MG/0.4ML ~~LOC~~ SOLN
40.0000 mg | SUBCUTANEOUS | Status: DC
  Administered 2012-09-12 – 2012-09-15 (×4): 40 mg via SUBCUTANEOUS
  Filled 2012-09-12 (×5): qty 0.4

## 2012-09-12 MED ORDER — POTASSIUM CHLORIDE CRYS ER 20 MEQ PO TBCR
20.0000 meq | EXTENDED_RELEASE_TABLET | Freq: Every day | ORAL | Status: DC
  Administered 2012-09-12: 20 meq via ORAL
  Filled 2012-09-12 (×2): qty 1

## 2012-09-12 MED ORDER — INSULIN ASPART 100 UNIT/ML ~~LOC~~ SOLN
0.0000 [IU] | Freq: Every day | SUBCUTANEOUS | Status: DC
  Administered 2012-09-13: 2 [IU] via SUBCUTANEOUS

## 2012-09-12 MED ORDER — FUROSEMIDE 10 MG/ML IJ SOLN
40.0000 mg | Freq: Once | INTRAMUSCULAR | Status: AC
  Administered 2012-09-12: 40 mg via INTRAVENOUS
  Filled 2012-09-12: qty 4

## 2012-09-12 MED ORDER — LOSARTAN POTASSIUM 25 MG PO TABS
25.0000 mg | ORAL_TABLET | Freq: Every day | ORAL | Status: DC
  Administered 2012-09-12 – 2012-09-13 (×2): 25 mg via ORAL
  Filled 2012-09-12 (×2): qty 1

## 2012-09-12 MED ORDER — FUROSEMIDE 10 MG/ML IJ SOLN
40.0000 mg | Freq: Two times a day (BID) | INTRAMUSCULAR | Status: DC
  Administered 2012-09-12 – 2012-09-13 (×2): 40 mg via INTRAVENOUS
  Filled 2012-09-12 (×4): qty 4

## 2012-09-12 MED ORDER — NITROGLYCERIN 2 % TD OINT
1.0000 [in_us] | TOPICAL_OINTMENT | Freq: Four times a day (QID) | TRANSDERMAL | Status: DC
  Administered 2012-09-12 – 2012-09-16 (×15): 1 [in_us] via TOPICAL
  Filled 2012-09-12 (×2): qty 30
  Filled 2012-09-12: qty 1

## 2012-09-12 MED ORDER — SODIUM CHLORIDE 0.9 % IJ SOLN
3.0000 mL | Freq: Two times a day (BID) | INTRAMUSCULAR | Status: DC
  Administered 2012-09-12 – 2012-09-16 (×7): 3 mL via INTRAVENOUS

## 2012-09-12 MED ORDER — SODIUM CHLORIDE 0.9 % IJ SOLN: 3.0000 mL | INTRAMUSCULAR | Status: DC | PRN

## 2012-09-12 MED ORDER — ACETAMINOPHEN 325 MG PO TABS
650.0000 mg | ORAL_TABLET | ORAL | Status: DC | PRN
  Administered 2012-09-13: 650 mg via ORAL
  Filled 2012-09-12: qty 2

## 2012-09-12 NOTE — ED Provider Notes (Signed)
History    CSN: [id number] Arrival date & time 09/12/12  1356 First MD Initiated Contact with Patient 09/12/12 1401    PCP [place] Charon  Chief Complaint  Patient presents with  . Congestive Heart Failure    Patient is a 72 y.o. female presenting with CHF. The history is provided by the patient.  Congestive Heart Failure This is a recurrent problem. The current episode started more than 1 week ago (maybe a month ago). The problem has been rapidly worsening. Associated symptoms include shortness of breath. Pertinent negatives include no chest pain, no abdominal pain and no headaches. The symptoms are aggravated by walking (lying flat). Relieved by: sitting up. She has tried nothing for the symptoms.  Pt has not been sleeping well at night.  She needs to sleep sitting up.  She has noticed leg swelling and weight gain.  She has been taking her medication.  She went to see her doctor today who sent her to the ED.  Past Medical History  Diagnosis Date  . Hypertension   . Thyroid disease   . Diabetes mellitus     borderline diabetes  . UTI (lower urinary tract infection)   . 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 COPD (chronic obstructive pulmonary disease)   . CHF (congestive heart failure)     Past Surgical History  Procedure Date  . Abdominal hysterectomy   . Cystoscopy 03/23/2012    Procedure: CYSTOSCOPY;  Surgeon: Garnett Farm, MD;  Location: [facility];  Service: Urology;  Laterality: N/A;  . Transurethral resection of bladder tumor 03/23/2012    Procedure: TRANSURETHRAL RESECTION OF BLADDER TUMOR (TURBT);  Surgeon: Garnett Farm, MD;  Location: [facility];  Service: Urology;  Laterality: N/A;    Family History  Problem Relation Age of Onset  . Diabetes Mother   . Diabetes Father     History  Substance Use Topics  . Smoking status: Former Smoker -- 1.0 packs/day for 50 years    Types: Cigarettes    Quit date: 09/24/2006  . Smokeless tobacco: Never Used  . Alcohol Use: No     OB History    Grav Para Term Preterm Abortions TAB SAB Ect Mult Living                  Review of Systems  Respiratory: Positive for shortness of breath.   Cardiovascular: Negative for chest pain.  Gastrointestinal: Negative for abdominal pain.  Neurological: Negative for headaches.  All other systems reviewed and are negative.    Allergies  Review of patient's allergies indicates no known allergies.  Home Medications   Current Outpatient Rx  Name  Route  Sig  Dispense  Refill  . LOSARTAN POTASSIUM-HCTZ 100-25 MG PO TABS   Oral   Take 1 tablet by mouth daily.         [name] METHIMAZOLE 10 MG PO TABS   Oral   Take 10 mg by mouth daily.            BP 157/66  Pulse 89  Temp 98.4 F (36.9 C) (Oral)  Resp 26  SpO2 98%  Physical Exam  Nursing note and vitals reviewed. Constitutional: She appears well-developed and well-nourished.  HENT:  Head: Normocephalic and atraumatic.  Right Ear: External ear normal.  Left Ear: External ear normal.  Eyes: Conjunctivae normal are normal. Right eye exhibits no discharge. Left eye exhibits no discharge. No scleral icterus.  Neck: Neck supple. No tracheal  deviation present.  Cardiovascular: Normal rate, regular rhythm and intact distal pulses.   Pulmonary/Chest: Effort normal. No stridor. No respiratory distress. She has no wheezes. She has rales.       Labored breathing  Abdominal: Soft. Bowel sounds are normal. She exhibits no distension. There is no tenderness. There is no rebound and no guarding.  Musculoskeletal: She exhibits edema (bilateral lower extrem, tense pitting edema) and tenderness.  Neurological: She is alert. She has normal strength. No sensory deficit. Cranial nerve deficit:  no gross defecits noted. She exhibits normal muscle tone. She displays no seizure activity. Coordination normal.  Skin: Skin is warm and dry. No rash noted.  Psychiatric: She has a normal mood and affect.    ED Course  Procedures  (including critical care time)  Rate: 87  Rhythm: normal sinus rhythm  QRS Axis: normal  Intervals: normal  ST/T Wave abnormalities: normal  Conduction Disutrbances: Nonspecific intraventricular conduction delay  Narrative Interpretation: left atrial abnormality, borderline R-wave progression  Old EKG Reviewed: Nonspecific changes  ED Medication Orders        Status Ordering Provider    09/12/12 1800    nitroGLYCERIN (NITROGLYN) 2 % ointment 1 inch 4 times per day    Discontinue    Route: Topical  Ordered Dose: 1 inch        Ordered Alyce Inscore R    09/12/12 1530    furosemide (LASIX) injection 40 mg Once    Discontinue    Route: Intravenous  Ordered Dose: 40 mg          Labs Reviewed  PRO B NATRIURETIC PEPTIDE - Abnormal; Notable for the following:    Pro B Natriuretic peptide (BNP) 653.9 (*)     All other components within normal limits  CBC - Abnormal; Notable for the following:    Hemoglobin 9.1 (*)     HCT 31.9 (*)     MCV 63.2 (*)     MCH 18.0 (*)     MCHC 28.5 (*)     RDW 23.2 (*)     All other components within normal limits  COMPREHENSIVE METABOLIC PANEL - Abnormal; Notable for the following:    Albumin 3.0 (*)     GFR calc non Af Amer 89 (*)     All other components within normal limits  POCT I-STAT TROPONIN I   Dg Chest Port 1 View  09/12/2012  *RADIOLOGY REPORT*  Clinical Data: Shortness of breath, congestive heart failure  PORTABLE CHEST - 1 VIEW  Comparison: 09/08/2012  Findings: Cardiomegaly with pulmonary vascular congestion and possible mild interstitial edema.  Additional mild patchy bibasilar opacities, likely atelectasis.  No pleural effusion or pneumothorax.  IMPRESSION: Cardiomegaly with pulmonary vascular congestion and possible mild interstitial edema.   Original Report Authenticated By: Charline Bills, M.D.      1. Acute congestive heart failure   2. Pulmonary edema       MDM  Patient's x-ray and laboratory test are consistent with acute  congestive heart failure. Patient's vital signs are stable. She has responded to nasal cannula oxygen.  I have ordered Lasix and nitroglycerin to assist in her diuresis and hypertension. Will consult with the medical service for admission and further treatment        Celene Kras, MD 09/12/12 1528

## 2012-09-12 NOTE — ED Notes (Signed)
Pt assisted to bathroom, became very short of breath. Assisted back to exam room and placed on cardiac monitor. Admitting physician at the bedside. Vital signs stable. Family remains at bedside.

## 2012-09-12 NOTE — H&P (Signed)
[facility] History and Physical  QUANIYA DAMAS UJW:[id number] DOB: [birth year] DOA: 09/12/2012  Referring physician: er PCP: [name], MD  Specialists:   Chief Complaint: SOB  HPI: HA Sonya Oliver is a 72 y.o. female  Who has a history of bladder cancer.  She has been getting short of breath for at least 1 month- for the last week it has worsened.  She is unable to lay flat.  She is unable to exert herself with out SOB.  +LE swelling.  No new medications and no missed medications.  No CP, no fever, no chills- not on home O2 Does eat canned vegetables but tries to limit her salt intake  In the ER, she was found to be hypoxic and there was pulm edema on her chest x ray along with cardiomegaly.  Mildly elevated BNP.  Patient ambulated to the bathroom without O2 and became hypoxic.  Her last echo was 4/13 and showed diastolic dyfn  Review of Systems: all systems reviewed, negative unless stated above   Past Medical History  Diagnosis Date  . Hypertension   . Thyroid disease   . Diabetes mellitus     borderline diabetes  . UTI (lower urinary tract infection)   . 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 COPD (chronic obstructive pulmonary disease)   . CHF (congestive heart failure)    Past Surgical History  Procedure Date  . Abdominal hysterectomy   . Cystoscopy 03/23/2012    Procedure: CYSTOSCOPY;  Surgeon: Garnett Farm, MD;  Location: [facility];  Service: Urology;  Laterality: N/A;  . Transurethral resection of bladder tumor 03/23/2012    Procedure: TRANSURETHRAL RESECTION OF BLADDER TUMOR (TURBT);  Surgeon: Garnett Farm, MD;  Location: [facility];  Service: Urology;  Laterality: N/A;   Social History:  reports that she quit smoking about 5 years ago. Her smoking use included Cigarettes. She has a 50 pack-year smoking history. She has never used smokeless tobacco. She reports that she does not drink alcohol or use illicit drugs. Has a cane at home but does not  use  No Known Allergies  Family History  Problem Relation Age of Onset  . Diabetes Mother   . Diabetes Father     Prior to Admission medications   Medication Sig Start Date End Date Taking? Authorizing Provider  losartan-hydrochlorothiazide (HYZAAR) 100-25 MG per tablet Take 1 tablet by mouth daily.   Yes Historical Provider, MD  methimazole (TAPAZOLE) 10 MG tablet Take 10 mg by mouth daily.     Historical Provider, MD   Physical Exam: Filed Vitals:   09/12/12 1405 09/12/12 1544  BP: 157/66 151/66  Pulse: 89 82  Temp: 98.4 F (36.9 C)   TempSrc: Oral   Resp: 26 18  SpO2: 98% 97%     General:  A+Ox3, mild increase work of breathing, uncomfortable just ambulated without O2  Eyes: mild drooping over left eye  ENT: wnl  Cardiovascular: rrr, no murmur  Respiratory: crackles b/l, moderate increase in the work of breathing  Abdomen: +BS, soft, NT/ND  Skin: +chronic changes in lower ext with edema  Musculoskeletal: moves all 4 extremitites  Psychiatric: no SI/no HI  Neurologic: CH 2-12 intact  Labs on Admission:  Basic Metabolic Panel:  Lab 09/12/12 5621  NA 136  K 3.8  CL 102  CO2 27  GLUCOSE 87  BUN 12  CREATININE 0.58  CALCIUM 8.9  MG --  PHOS --   Liver  Function Tests:  Lab 09/12/12 1423  AST 18  ALT 8  ALKPHOS 66  BILITOT 0.6  PROT 8.2  ALBUMIN 3.0*   No results found for this basename: LIPASE:5,AMYLASE:5 in the last 168 hours No results found for this basename: AMMONIA:5 in the last 168 hours CBC:  Lab 09/12/12 1423  WBC 4.5  NEUTROABS --  HGB 9.1*  HCT 31.9*  MCV 63.2*  PLT 157   Cardiac Enzymes: No results found for this basename: CKTOTAL:5,CKMB:5,CKMBINDEX:5,TROPONINI:5 in the last 168 hours  BNP (last 3 results)  Basename 09/12/12 1423  PROBNP 653.9*   CBG: No results found for this basename: GLUCAP:5 in the last 168 hours  Radiological Exams on Admission: Dg Chest Port 1 View  09/12/2012  *RADIOLOGY REPORT*  Clinical  Data: Shortness of breath, congestive heart failure  PORTABLE CHEST - 1 VIEW  Comparison: 09/08/2012  Findings: Cardiomegaly with pulmonary vascular congestion and possible mild interstitial edema.  Additional mild patchy bibasilar opacities, likely atelectasis.  No pleural effusion or pneumothorax.  IMPRESSION: Cardiomegaly with pulmonary vascular congestion and possible mild interstitial edema.   Original Report Authenticated By: Charline Bills, M.D.     EKG: Independently reviewed.   Assessment/Plan Active Problems:  Bladder mass  Hyperthyroidism  Anemia  SOB (shortness of breath)  Edema leg  Diabetes   1. SOB with slightly elevated BNP-  Check echo, last echo was 7/13 with diastolic dfxn, patient does have a h/o smoking but at this time appears to be more of a CHF vs COPD (no Wheezing)- lasix BID 2. Bladder mass- treated by urology 3. Anemia- stable 4. Edema- lasix- trend Cr 5. DM- SSI 6. HTN- nitro placed in ER- ARB ordered 7. Thyroid disease hx- check TSH    Code Status: full Family Communication: daughters at bedside Disposition Plan:   Time spent: 36 min  Marlin Canary [facility] Pager [phone]  If 7PM-7AM, please contact night-coverage [email or web address] Password [facility] 09/12/2012, 4:50 PM

## 2012-09-12 NOTE — ED Notes (Signed)
Pt denies CP or SOB at the present.  Skin warm and dry.  Speaking in complete sentences.  Resp symmetrical and unlabored. At the present

## 2012-09-12 NOTE — ED Notes (Addendum)
Pt presents to department via [facility] from [facility] for evaluation of SOB and possible CHF exacerbation. Expiratory wheezing noted R lung fields. Pt states she became short of breath this morning. Denies chest pain. Respirations unlabored. Speaking complete sentences upon arrival. Bilateral lower extremity edema noted. 18g LAC. Pt is alert and oriented x4. CBG 119.

## 2012-09-13 ENCOUNTER — Encounter (HOSPITAL_COMMUNITY): Payer: Self-pay | Admitting: General Practice

## 2012-09-13 DIAGNOSIS — N3289 Other specified disorders of bladder: Secondary | ICD-10-CM

## 2012-09-13 DIAGNOSIS — I059 Rheumatic mitral valve disease, unspecified: Secondary | ICD-10-CM

## 2012-09-13 DIAGNOSIS — D649 Anemia, unspecified: Secondary | ICD-10-CM

## 2012-09-13 DIAGNOSIS — D62 Acute posthemorrhagic anemia: Secondary | ICD-10-CM

## 2012-09-13 LAB — T4, FREE: Free T4: 1.29 ng/dL (ref 0.80–1.80)

## 2012-09-13 LAB — BASIC METABOLIC PANEL
BUN: 12 mg/dL (ref 6–23)
Creatinine, Ser: 0.62 mg/dL (ref 0.50–1.10)
GFR calc Af Amer: >90 mL/min (ref >90)
GFR calc non Af Amer: 87 mL/min — ABNORMAL LOW (ref >90)
Potassium: 3 mEq/L — ABNORMAL LOW (ref 3.5–5.1)

## 2012-09-13 LAB — GLUCOSE, CAPILLARY: Glucose-Capillary: 64 mg/dL — ABNORMAL LOW (ref 70–99)

## 2012-09-13 LAB — TSH: TSH: 0.118 u[IU]/mL — ABNORMAL LOW (ref 0.350–4.500)

## 2012-09-13 LAB — TROPONIN I: Troponin I: <0.3 ng/mL (ref <0.30)

## 2012-09-13 MED ORDER — FUROSEMIDE 10 MG/ML IJ SOLN
100.0000 mg | INTRAMUSCULAR | Status: DC
  Administered 2012-09-13: 100 mg via INTRAVENOUS
  Filled 2012-09-13 (×7): qty 10

## 2012-09-13 MED ORDER — CYCLOBENZAPRINE HCL 5 MG PO TABS
5.0000 mg | ORAL_TABLET | Freq: Once | ORAL | Status: AC
  Administered 2012-09-13: 5 mg via ORAL
  Filled 2012-09-13: qty 1

## 2012-09-13 MED ORDER — FUROSEMIDE 10 MG/ML IJ SOLN
10.0000 mg/h | Freq: Every day | INTRAVENOUS | Status: DC
  Filled 2012-09-13: qty 25

## 2012-09-13 MED ORDER — POTASSIUM CHLORIDE CRYS ER 20 MEQ PO TBCR
40.0000 meq | EXTENDED_RELEASE_TABLET | Freq: Two times a day (BID) | ORAL | Status: DC
  Administered 2012-09-13 (×2): 40 meq via ORAL
  Filled 2012-09-13 (×4): qty 2

## 2012-09-13 MED ORDER — CARVEDILOL 3.125 MG PO TABS
3.1250 mg | ORAL_TABLET | Freq: Two times a day (BID) | ORAL | Status: DC
  Administered 2012-09-13 – 2012-09-16 (×6): 3.125 mg via ORAL
  Filled 2012-09-13 (×8): qty 1

## 2012-09-13 MED ORDER — FUROSEMIDE 10 MG/ML IJ SOLN
100.0000 mg | INTRAMUSCULAR | Status: DC
  Filled 2012-09-13 (×3): qty 10

## 2012-09-13 NOTE — Progress Notes (Signed)
Patient evaluated for long-term disease management services with [facility].Spoke with patient at bedside to explain services. Consents were signed.She reports she lives alone but has a supportive daughter and grandson who take her to her appointments. She does report having a scale at home. Explained how services with [facility] will not interfere with any services arranged by inpatient case management or social work. Patient will receive a post discharge transition of care call and monthly home visits for assessments and for education.  Left packet at bedside and contact information.     Raiford Noble, [facility] Liaison [phone]

## 2012-09-13 NOTE — Progress Notes (Signed)
PT Cancellation Note  Patient Details Name: Sonya Oliver MRN: [id number] DOB: Feb 03, 1941   Cancelled Treatment:    Reason Eval/Treat Not Completed: Patient at procedure or test/unavailable. Will f/u as time and schedule allow.   [facility] HELEN 09/13/2012, 9:23 AM Pager: [phone]

## 2012-09-13 NOTE — Evaluation (Signed)
Physical Therapy Evaluation Patient Details Name: Sonya Oliver MRN: [id number] DOB: 1941/07/31 Today's Date: 09/13/2012 Time:  -     PT Assessment / Plan / Recommendation Clinical Impression  72 y/o admitted with SOB and found to be hypoxic. Presents to PT today with generalized weakness, decreased activity tolerance and impaired balance affecting her safety and independence with functional mobility. Will benefit physical therapy in the acute setting to address below impairments and maximize safety for d/c home. Given she lives alone I recommend ST-SNF prior to d/c home. Pt unsure and would like to think about this. If pt decides against this option I would recommend HHPT.     PT Assessment  Patient needs continued PT services    Follow Up Recommendations  SNF(if pt declines SNF recommend HHPT)    Does the patient have the potential to tolerate intense rehabilitation      Barriers to Discharge Decreased caregiver support family works, can check on her everyday    Equipment Recommendations  Rolling walker with 5" wheels    Recommendations for Other Services     Frequency Min 3X/week    Precautions / Restrictions Precautions Precautions: Fall Restrictions Weight Bearing Restrictions: No   Pertinent Vitals/Pain On  3 liters [place] SaO2 maintained above 95% before and after ambulation, no c/o shortness of breath      Mobility  Bed Mobility Bed Mobility: Supine to Sit;Sit to Supine Supine to Sit: 5: Supervision;With rails;HOB elevated (20 degrees) Sit to Supine: 5: Supervision;With rail;HOB elevated (20 degrees) Details for Bed Mobility Assistance: cues for efficiency of movement, increased tiem to complete Transfers Transfers: Sit to Stand;Stand to Sit (x2) Sit to Stand: 4: Min guard;With upper extremity assist;From bed Stand to Sit: 4: Min guard;With upper extremity assist;To bed Details for Transfer Assistance: cues for safe hand placement, more gaurding needed to stand  without RW in front of her to steady herself, with RW she needed supervision Ambulation/Gait Ambulation/Gait Assistance: 4: Min assist;4: Min guard Ambulation Distance (Feet): 280 Feet Assistive device: Rolling walker;None Ambulation/Gait Assistance Details: Ambulated the first 80 ft without AD needing minA stability assist and reaching for upper extremity support on the hand rail, slow gaurded pace with flexed trunk and crouched type stance as well as decreased stride; with RW pt ambulated about 200 ft with cues for safe positioning within RW and tall posture especially negotiating RW within tight spaces and turning, speed and stability improved significantly Gait Pattern: Trunk flexed;Step-through pattern Stairs: No              PT Diagnosis: Difficulty walking;Abnormality of gait;Generalized weakness  PT Problem List: Decreased strength;Decreased activity tolerance;Decreased balance;Decreased mobility;Decreased knowledge of use of DME PT Treatment Interventions: DME instruction;Gait training;Functional mobility training;Therapeutic activities;Therapeutic exercise;Patient/family education;Neuromuscular re-education;Balance training   PT Goals Acute Rehab PT Goals PT Goal Formulation: With patient Time For Goal Achievement: 09/20/12 Potential to Achieve Goals: Good Pt will go Supine/Side to Sit: with modified independence PT Goal: Supine/Side to Sit - Progress: Goal set today Pt will go Sit to Supine/Side: with modified independence PT Goal: Sit to Supine/Side - Progress: Goal set today Pt will go Sit to Stand: with modified independence PT Goal: Sit to Stand - Progress: Goal set today Pt will go Stand to Sit: with modified independence PT Goal: Stand to Sit - Progress: Goal set today Pt will Transfer Bed to Chair/Chair to Bed: with modified independence PT Transfer Goal: Bed to Chair/Chair to Bed - Progress: Goal set today Pt will Ambulate: >  150 feet;with modified independence;with  least restrictive assistive device PT Goal: Ambulate - Progress: Goal set today Pt will Perform Home Exercise Program: Independently PT Goal: Perform Home Exercise Program - Progress: Goal set today  Visit Information       Subjective Data  Subjective: Im just tired.    Prior Functioning  Home Living Lives With: Alone Available Help at Discharge: Family;Available PRN/intermittently Type of Home: Apartment (handicap accessible) Home Access: Level entry Home Layout: One level Bathroom Shower/Tub: Health visitor: Handicapped height Home Adaptive Equipment: Straight cane Prior Function Level of Independence: Independent Driving: No Vocation: Retired Comments: able to do her shopping as long as she is pushing a Charity fundraiser: No difficulties    Cognition  Overall Cognitive Status: Appears within functional limits for tasks assessed/performed Arousal/Alertness: Awake/alert Orientation Level: Appears intact for tasks assessed Behavior During Session: [facility] for tasks performed    Extremity/Trunk Assessment Right Upper Extremity Assessment RUE ROM/Strength/Tone: [facility] for tasks assessed Left Upper Extremity Assessment LUE ROM/Strength/Tone: WFL for tasks assessed Right Lower Extremity Assessment RLE ROM/Strength/Tone: Deficits RLE ROM/Strength/Tone Deficits: generally weak, grossly 4/5 Left Lower Extremity Assessment LLE ROM/Strength/Tone: Deficits LLE ROM/Strength/Tone Deficits: generally weak, grossly 4/5  Trunk Assessment Trunk Assessment: Kyphotic   Balance Balance Balance Assessed: Yes Static Standing Balance Static Standing - Balance Support: No upper extremity supported Static Standing - Level of Assistance: 5: Stand by assistance  End of Session PT - End of Session Equipment Utilized During Treatment: Gait belt Activity Tolerance: Patient tolerated treatment well Patient left: in bed;with call bell/phone within reach;with  family/visitor present Nurse Communication: Mobility status  GP     [facility] HELEN 09/13/2012, 4:49 PM

## 2012-09-13 NOTE — Progress Notes (Signed)
Attempted to apply XL TED hose to pt legs but pt stated they were uncomfortable and hurt and refused to wear them.  Will con't to monitor.

## 2012-09-13 NOTE — Progress Notes (Signed)
Hypoglycemic Event  CBG: 64   Treatment: 15 GM carbohydrate snack  Symptoms: None  Follow-up CBG: Time:0720 CBG Result:134  Possible Reasons for Event: Unknown  Comments/MD notified:On call MD for [facility] was paged and notified about low CBG.     Sonya Oliver  Remember to initiate Hypoglycemia Order Set & complete

## 2012-09-13 NOTE — Progress Notes (Signed)
[facility]                                                                                Patient Demographics  Sonya Oliver, is a 72 y.o. female  ZOX:[id number]  WJX:[id number]  DOB - [birth year]  Admit date - 09/12/2012  Admitting Physician Joseph Art, DO  Outpatient Primary MD for the patient is [name], MD  LOS - 1   Chief Complaint  Patient presents with  . Congestive Heart Failure        Assessment & Plan    1. SOB with slightly elevated BNP and massive lower estimate the edema likely acute on chronic diastolic CHF versus nonspecific CHF EF is 55%, continue fluid restriction, TED stockings if patient can tolerate, initiate Lasix drip.   2. Bladder mass- treated by urology no acute issues outpatient followup.   3. Anemia- stable   4. Edema- lasix- trend Cr   5. DM- SSI  CBG (last 3)   Basename 09/13/12 1103 09/13/12 0727 09/13/12 0550  GLUCAP 93 134* 64*     6. HTN- nitro placed in ER- will add beta blocker   7. History of hyper thyroidism. Patient is on methimazole, TSH slightly low, will repeat TSH along with free T3 and free T4    Code Status: full  Family Communication: patient  Disposition Plan: home   Procedures echo   Consults  none   DVT Prophylaxis  Lovenox    Lab Results  Component Value Date   PLT 157 09/12/2012    Medications  Scheduled Meds:   . enoxaparin (LOVENOX) injection  40 mg Subcutaneous Q24H  . furosemide (LASIX) infusion  10 mg/hr Intravenous Daily  . insulin aspart  0-15 Units Subcutaneous TID WC  . insulin aspart  0-5 Units Subcutaneous QHS  . losartan  25 mg Oral Daily  . nitroGLYCERIN  1 inch Topical Q6H  . potassium chloride  40 mEq Oral BID  . sodium chloride  3 mL Intravenous Q12H   Continuous Infusions:  PRN Meds:.sodium chloride, acetaminophen, ondansetron (ZOFRAN) IV, sodium chloride  Antibiotics     Anti-infectives    None       Time Spent in  minutes   35   Susa Raring K M.D on 09/13/2012 at 11:35 AM  Between 7am to 7pm - Pager - [phone]  After 7pm go to [email or web address] - password [email or web address]  And look for the night coverage person covering for me after hours  [facility] Office  [phone]    Subjective:   Sonya Oliver today has, No headache, No chest pain, No abdominal pain - No Nausea, No new weakness tingling or numbness, No Cough - SOB.   Objective:   Filed Vitals:   09/12/12 2003 09/13/12 0124 09/13/12 0557 09/13/12 1013  BP: 143/60 138/54 131/54 131/59  Pulse: 72 85 77 85  Temp: 98.1 F (36.7 C)  98.2 F (36.8 C)   TempSrc: Oral  Oral   Resp: 20 20 18 18   Weight:   91 kg (200 lb 9.9 oz)   SpO2: 95% 97% 98% 92%    Wt Readings from Last 3 Encounters:  09/13/12 91 kg (  200 lb 9.9 oz)  03/23/12 92.352 kg (203 lb 9.6 oz)  03/23/12 92.352 kg (203 lb 9.6 oz)     Intake/Output Summary (Last 24 hours) at 09/13/12 1135 Last data filed at 09/13/12 1106  Gross per 24 hour  Intake    727 ml  Output   3100 ml  Net  -2373 ml    Exam Awake Alert, Oriented X 3, No new F.N deficits, Normal affect [place].AT,PERRAL Supple Neck,No JVD, No cervical lymphadenopathy appriciated.  Symmetrical Chest wall movement, Good air movement bilaterally, CTAB RRR,No Gallops,Rubs or new Murmurs, No Parasternal Heave +ve B.Sounds, Abd Soft, Non tender, No organomegaly appriciated, No rebound - guarding or rigidity. No Cyanosis, Clubbing or edema, No new Rash or bruise    Data Review   Micro Results No results found for this or any previous visit (from the past 240 hour(s)).  Radiology Reports Dg Chest Port 1 View  09/12/2012  *RADIOLOGY REPORT*  Clinical Data: Shortness of breath, congestive heart failure  PORTABLE CHEST - 1 VIEW  Comparison: 09/08/2012  Findings: Cardiomegaly with pulmonary vascular congestion and possible mild interstitial edema.  Additional mild patchy bibasilar opacities, likely  atelectasis.  No pleural effusion or pneumothorax.  IMPRESSION: Cardiomegaly with pulmonary vascular congestion and possible mild interstitial edema.   Original Report Authenticated By: Charline Bills, M.D.     CBC  Lab 09/12/12 1423  WBC 4.5  HGB 9.1*  HCT 31.9*  PLT 157  MCV 63.2*  MCH 18.0*  MCHC 28.5*  RDW 23.2*  LYMPHSABS --  MONOABS --  EOSABS --  BASOSABS --  BANDABS --    Chemistries   Lab 09/13/12 0602 09/12/12 1423  NA 139 136  K 3.0* 3.8  CL 100 102  CO2 33* 27  GLUCOSE 72 87  BUN 12 12  CREATININE 0.62 0.58  CALCIUM 8.7 8.9  MG -- --  AST -- 18  ALT -- 8  ALKPHOS -- 66  BILITOT -- 0.6   ------------------------------------------------------------------------------------------------------------------ CrCl is unknown because both a height and weight (above a minimum accepted value) are required for this calculation. ------------------------------------------------------------------------------------------------------------------ No results found for this basename: HGBA1C:2 in the last 72 hours ------------------------------------------------------------------------------------------------------------------ No results found for this basename: CHOL:2,HDL:2,LDLCALC:2,TRIG:2,CHOLHDL:2,LDLDIRECT:2 in the last 72 hours ------------------------------------------------------------------------------------------------------------------  [facility] 09/12/12 1938  TSH 0.118*  T4TOTAL --  T3FREE --  THYROIDAB --   ------------------------------------------------------------------------------------------------------------------ No results found for this basename: VITAMINB12:2,FOLATE:2,FERRITIN:2,TIBC:2,IRON:2,RETICCTPCT:2 in the last 72 hours  Coagulation profile No results found for this basename: INR:5,PROTIME:5 in the last 168 hours  No results found for this basename: DDIMER:2 in the last 72 hours  Cardiac Enzymes  Lab 09/13/12 0602 09/13/12 0027 09/12/12  1938  CKMB -- -- --  TROPONINI <0.30 <0.30 <0.30  MYOGLOBIN -- -- --   ------------------------------------------------------------------------------------------------------------------ No components found with this basename: POCBNP:3

## 2012-09-13 NOTE — Progress Notes (Signed)
  Echocardiogram 2D Echocardiogram has been performed.  Sonya Oliver 09/13/2012, 10:09 AM

## 2012-09-14 LAB — BASIC METABOLIC PANEL
BUN: 10 mg/dL (ref 6–23)
Calcium: 9.1 mg/dL (ref 8.4–10.5)
GFR calc Af Amer: >90 mL/min (ref >90)
GFR calc non Af Amer: 90 mL/min — ABNORMAL LOW (ref >90)
Glucose, Bld: 99 mg/dL (ref 70–99)

## 2012-09-14 LAB — GLUCOSE, CAPILLARY
Glucose-Capillary: 114 mg/dL — ABNORMAL HIGH (ref 70–99)
Glucose-Capillary: 153 mg/dL — ABNORMAL HIGH (ref 70–99)

## 2012-09-14 MED ORDER — SODIUM CHLORIDE 0.9 % IV SOLN
100.0000 mg | INTRAVENOUS | Status: DC
  Filled 2012-09-14 (×2): qty 10

## 2012-09-14 MED ORDER — SODIUM CHLORIDE 0.9 % IV SOLN
100.0000 mg | INTRAVENOUS | Status: AC
  Administered 2012-09-14: 100 mg via INTRAVENOUS
  Filled 2012-09-14 (×4): qty 10

## 2012-09-14 MED ORDER — MAGNESIUM OXIDE 400 (241.3 MG) MG PO TABS
800.0000 mg | ORAL_TABLET | Freq: Two times a day (BID) | ORAL | Status: AC
  Administered 2012-09-14 – 2012-09-15 (×4): 800 mg via ORAL
  Filled 2012-09-14 (×5): qty 2

## 2012-09-14 MED ORDER — FUROSEMIDE 10 MG/ML IJ SOLN
100.0000 mg | INTRAMUSCULAR | Status: DC
  Filled 2012-09-14 (×4): qty 10

## 2012-09-14 MED ORDER — POTASSIUM CHLORIDE CRYS ER 20 MEQ PO TBCR
40.0000 meq | EXTENDED_RELEASE_TABLET | Freq: Three times a day (TID) | ORAL | Status: DC
  Administered 2012-09-14 (×2): 40 meq via ORAL
  Filled 2012-09-14 (×5): qty 2

## 2012-09-14 MED ORDER — SODIUM CHLORIDE 0.9 % IV SOLN: 100.0000 mg | INTRAVENOUS | Status: DC

## 2012-09-14 MED ORDER — SODIUM CHLORIDE 0.9 % IV SOLN
100.0000 mg | INTRAVENOUS | Status: DC
  Filled 2012-09-14: qty 10

## 2012-09-14 MED ORDER — FUROSEMIDE 10 MG/ML IJ SOLN
100.0000 mg | INTRAMUSCULAR | Status: DC
  Filled 2012-09-14: qty 10

## 2012-09-14 MED ORDER — SODIUM CHLORIDE 0.9 % IV SOLN
100.0000 mg | INTRAVENOUS | Status: AC
  Administered 2012-09-15: 100 mg via INTRAVENOUS
  Filled 2012-09-14: qty 10

## 2012-09-14 NOTE — Progress Notes (Signed)
UR Completed.  Manuela Halbur Jane [phone] 07/05/2013  

## 2012-09-14 NOTE — Plan of Care (Signed)
Problem: Phase I Progression Outcomes Goal: EF % per last Echo/documented,Core Reminder form on chart Outcome: Completed/Met Date Met:  09/14/12 Per echo on 09/13/12 EF= 55-60%

## 2012-09-14 NOTE — Progress Notes (Signed)
[facility]                                                                                Patient Demographics  Sonya Oliver, is a 72 y.o. female  ZOX:[id number]  WJX:[id number]  DOB - 12/07/1940  Admit date - 09/12/2012  Admitting Physician Joseph Art, DO  Outpatient Primary MD for the patient is [name], MD  LOS - 2   Chief Complaint  Patient presents with  . Congestive Heart Failure        Assessment & Plan    1. SOB with slightly elevated BNP and massive lower estimate the edema likely acute on chronic diastolic CHF versus nonspecific CHF EF is 55%, continue fluid restriction, TED stockings if patient can tolerate,continue Lasix drip.   2. Bladder mass- treated by urology no acute issues outpatient followup.   3. Anemia- stable   4. Edema- lasix- trend Cr   5. DM- SSI  CBG (last 3)   Basename 09/13/12 2113 09/13/12 1601 09/13/12 1103  GLUCAP 205* 121* 93     6. HTN- nitro placed in ER- will add beta blocker    7. History of hyperthyroidism. Patient is on methimazole, TSH slightly low,  stable free T3 and free T4, and have her follow with her primary endocrinologist in the next one week.    Code Status: full  Family Communication: patient  Disposition Plan: home   Procedures echo   Consults  none   DVT Prophylaxis  Lovenox    Lab Results  Component Value Date   PLT 157 09/12/2012    Medications  Scheduled Meds:    . carvedilol  3.125 mg Oral BID WC  . enoxaparin (LOVENOX) injection  40 mg Subcutaneous Q24H  . insulin aspart  0-15 Units Subcutaneous TID WC  . insulin aspart  0-5 Units Subcutaneous QHS  . magnesium oxide  800 mg Oral BID  . nitroGLYCERIN  1 inch Topical Q6H  . potassium chloride  40 mEq Oral TID  . sodium chloride  3 mL Intravenous Q12H   Continuous Infusions:    . furosemide (LASIX) IVPB in NS IV bolus Stopped (09/14/12 0009)   PRN Meds:.sodium chloride,  acetaminophen, ondansetron (ZOFRAN) IV, sodium chloride  Antibiotics     Anti-infectives    None       Time Spent in minutes   35   Susa Raring K M.D on 09/14/2012 at 10:10 AM  Between 7am to 7pm - Pager - [phone]  After 7pm go to [email or web address] - password [email or web address]  And look for the night coverage person covering for me after hours  [facility] Office  [phone]    Subjective:   [name] today has, No headache, No chest pain, No abdominal pain - No Nausea, No new weakness tingling or numbness, No Cough - SOB.   Objective:   Filed Vitals:   09/13/12 2013 09/14/12 0154 09/14/12 0536 09/14/12 0910  BP: 139/63 112/51 127/53 133/71  Pulse: 74 83 84 67  Temp: 98.4 F (36.9 C)  99.1 F (37.3 C)   TempSrc: Oral  Oral   Resp: 16  18 18   Height:  Weight:   89.2 kg (196 lb 10.4 oz)   SpO2: 97%  97% 96%    Wt Readings from Last 3 Encounters:  09/14/12 89.2 kg (196 lb 10.4 oz)  03/23/12 92.352 kg (203 lb 9.6 oz)  03/23/12 92.352 kg (203 lb 9.6 oz)     Intake/Output Summary (Last 24 hours) at 09/14/12 1010 Last data filed at 09/14/12 0808  Gross per 24 hour  Intake   1060 ml  Output   3100 ml  Net  -2040 ml    Exam Awake Alert, Oriented X 3, No new F.N deficits, Normal affect [place].AT,PERRAL Supple Neck,No JVD, No cervical lymphadenopathy appriciated.  Symmetrical Chest wall movement, Good air movement bilaterally, CTAB RRR,No Gallops,Rubs or new Murmurs, No Parasternal Heave +ve B.Sounds, Abd Soft, Non tender, No organomegaly appriciated, No rebound - guarding or rigidity. No Cyanosis, Clubbing, 2+ edema, No new Rash or bruise    Data Review   Micro Results No results found for this or any previous visit (from the past 240 hour(s)).  Radiology Reports Dg Chest Port 1 View  09/12/2012  *RADIOLOGY REPORT*  Clinical Data: Shortness of breath, congestive heart failure  PORTABLE CHEST - 1 VIEW  Comparison: 09/08/2012  Findings:  Cardiomegaly with pulmonary vascular congestion and possible mild interstitial edema.  Additional mild patchy bibasilar opacities, likely atelectasis.  No pleural effusion or pneumothorax.  IMPRESSION: Cardiomegaly with pulmonary vascular congestion and possible mild interstitial edema.   Original Report Authenticated By: Charline Bills, M.D.     CBC  Lab 09/12/12 1423  WBC 4.5  HGB 9.1*  HCT 31.9*  PLT 157  MCV 63.2*  MCH 18.0*  MCHC 28.5*  RDW 23.2*  LYMPHSABS --  MONOABS --  EOSABS --  BASOSABS --  BANDABS --    Chemistries   Lab 09/14/12 0625 09/13/12 0602 09/12/12 1423  NA 138 139 136  K 3.4* 3.0* 3.8  CL 96 100 102  CO2 36* 33* 27  GLUCOSE 99 72 87  BUN 10 12 12   CREATININE 0.57 0.62 0.58  CALCIUM 9.1 8.7 8.9  MG 1.7 -- --  AST -- -- 18  ALT -- -- 8  ALKPHOS -- -- 66  BILITOT -- -- 0.6   ------------------------------------------------------------------------------------------------------------------ estimated creatinine clearance is 70.5 ml/min (by C-G formula based on Cr of 0.57). ------------------------------------------------------------------------------------------------------------------ No results found for this basename: HGBA1C:2 in the last 72 hours ------------------------------------------------------------------------------------------------------------------ No results found for this basename: CHOL:2,HDL:2,LDLCALC:2,TRIG:2,CHOLHDL:2,LDLDIRECT:2 in the last 72 hours ------------------------------------------------------------------------------------------------------------------  Basename 09/13/12 1235  TSH 0.120*  T4TOTAL --  T3FREE 3.0  THYROIDAB --   ------------------------------------------------------------------------------------------------------------------ No results found for this basename: VITAMINB12:2,FOLATE:2,FERRITIN:2,TIBC:2,IRON:2,RETICCTPCT:2 in the last 72 hours  Coagulation profile No results found for this basename:  INR:5,PROTIME:5 in the last 168 hours  No results found for this basename: DDIMER:2 in the last 72 hours  Cardiac Enzymes  Lab 09/13/12 0602 09/13/12 0027 09/12/12 1938  CKMB -- -- --  TROPONINI <0.30 <0.30 <0.30  MYOGLOBIN -- -- --   ------------------------------------------------------------------------------------------------------------------ No components found with this basename: POCBNP:3

## 2012-09-15 LAB — GLUCOSE, CAPILLARY
Glucose-Capillary: 73 mg/dL (ref 70–99)
Glucose-Capillary: 140 mg/dL — ABNORMAL HIGH (ref 70–99)
Glucose-Capillary: 90 mg/dL (ref 70–99)

## 2012-09-15 LAB — BASIC METABOLIC PANEL
BUN: 11 mg/dL (ref 6–23)
CO2: 36 mEq/L — ABNORMAL HIGH (ref 19–32)
Glucose, Bld: 77 mg/dL (ref 70–99)
Potassium: 4 mEq/L (ref 3.5–5.1)
Sodium: 137 mEq/L (ref 135–145)

## 2012-09-15 MED ORDER — POTASSIUM CHLORIDE CRYS ER 20 MEQ PO TBCR
40.0000 meq | EXTENDED_RELEASE_TABLET | Freq: Two times a day (BID) | ORAL | Status: DC
  Administered 2012-09-15 – 2012-09-16 (×3): 40 meq via ORAL
  Filled 2012-09-15 (×2): qty 2

## 2012-09-15 NOTE — Progress Notes (Signed)
SATURATION QUALIFICATIONS: (This note is used to comply with regulatory documentation for home oxygen)  Patient Saturations on 2L at Rest = 95 %  Patient Saturations on Room Air while Ambulating = 79%  Patient Saturations on 2 Liters of oxygen while Ambulating = 93%  Please briefly explain why patient needs home oxygen: Pt. Has Congestive Heart Failure

## 2012-09-15 NOTE — Progress Notes (Signed)
[facility]                                                                                Patient Demographics  Sonya Oliver, is a 72 y.o. female  OZH:[id number]  [id number]:[id number]  DOB - February 07, 1941  Admit date - 09/12/2012  Admitting Physician Joseph Art, DO  Outpatient Primary MD for the patient is [name], MD  LOS - 3   Chief Complaint  Patient presents with  . Congestive Heart Failure        Assessment & Plan    1. SOB with slightly elevated BNP and massive lower estimate the edema likely acute on chronic diastolic CHF versus nonspecific CHF EF is 55%, continue fluid restriction, TED stockings if patient can tolerate,continue Lasix drip.   2. Bladder mass- treated by urology no acute issues outpatient followup.   3. Anemia- stable   4. Edema- lasix- trend Cr   5. DM- SSI  CBG (last 3)   Basename 09/15/12 0624 09/14/12 2111 09/14/12 1600  GLUCAP 73 153* 83     6. HTN- nitro placed in ER- will add beta blocker    7. History of hyperthyroidism. Patient is on methimazole, TSH slightly low,  stable free T3 and free T4, and have her follow with her primary endocrinologist in the next one week.    Code Status: full  Family Communication: patient  Disposition Plan: home   Procedures echo   Consults  none   DVT Prophylaxis  Lovenox    Lab Results  Component Value Date   PLT 157 09/12/2012    Medications  Scheduled Meds:    . carvedilol  3.125 mg Oral BID WC  . enoxaparin (LOVENOX) injection  40 mg Subcutaneous Q24H  . insulin aspart  0-15 Units Subcutaneous TID WC  . insulin aspart  0-5 Units Subcutaneous QHS  . magnesium oxide  800 mg Oral BID  . nitroGLYCERIN  1 inch Topical Q6H  . potassium chloride  40 mEq Oral BID  . sodium chloride  3 mL Intravenous Q12H   Continuous Infusions:    . furosemide (LASIX) IVPB in NS IV bolus     PRN Meds:.sodium chloride, acetaminophen, ondansetron (ZOFRAN)  IV, sodium chloride  Antibiotics     Anti-infectives    None       Time Spent in minutes   35   Susa Raring K M.D on 09/15/2012 at 10:36 AM  Between 7am to 7pm - Pager - [phone]  After 7pm go to [email or web address] - password [email or web address]  And look for the night coverage person covering for me after hours  [facility] Office  [phone]    Subjective:   Sonya Oliver today has, No headache, No chest pain, No abdominal pain - No Nausea, No new weakness tingling or numbness, No Cough - SOB.   Objective:   Filed Vitals:   09/14/12 1325 09/14/12 2114 09/15/12 0145 09/15/12 0423  BP: 123/51 133/53 136/57 129/58  Pulse: 68 66 71 79  Temp: 98.8 F (37.1 C) 98.2 F (36.8 C)  97.9 F (36.6 C)  TempSrc: Oral Oral  Oral  Resp: 18 17  18   Height:  Weight:    89.585 kg (197 lb 8 oz)  SpO2: 94% 99%  92%    Wt Readings from Last 3 Encounters:  09/15/12 89.585 kg (197 lb 8 oz)  03/23/12 92.352 kg (203 lb 9.6 oz)  03/23/12 92.352 kg (203 lb 9.6 oz)     Intake/Output Summary (Last 24 hours) at 09/15/12 1036 Last data filed at 09/15/12 0932  Gross per 24 hour  Intake   1260 ml  Output   3450 ml  Net  -2190 ml    Exam Awake Alert, Oriented X 3, No new F.N deficits, Normal affect [place].AT,PERRAL Supple Neck,No JVD, No cervical lymphadenopathy appriciated.  Symmetrical Chest wall movement, Good air movement bilaterally, CTAB RRR,No Gallops,Rubs or new Murmurs, No Parasternal Heave +ve B.Sounds, Abd Soft, Non tender, No organomegaly appriciated, No rebound - guarding or rigidity. No Cyanosis, Clubbing, 2+ edema, No new Rash or bruise    Data Review   Micro Results No results found for this or any previous visit (from the past 240 hour(s)).  Radiology Reports Dg Chest Port 1 View  09/12/2012  *RADIOLOGY REPORT*  Clinical Data: Shortness of breath, congestive heart failure  PORTABLE CHEST - 1 VIEW  Comparison: 09/08/2012  Findings: Cardiomegaly with  pulmonary vascular congestion and possible mild interstitial edema.  Additional mild patchy bibasilar opacities, likely atelectasis.  No pleural effusion or pneumothorax.  IMPRESSION: Cardiomegaly with pulmonary vascular congestion and possible mild interstitial edema.   Original Report Authenticated By: Charline Bills, M.D.     CBC  Lab 09/12/12 1423  WBC 4.5  HGB 9.1*  HCT 31.9*  PLT 157  MCV 63.2*  MCH 18.0*  MCHC 28.5*  RDW 23.2*  LYMPHSABS --  MONOABS --  EOSABS --  BASOSABS --  BANDABS --    Chemistries   Lab 09/15/12 0516 09/14/12 0625 09/13/12 0602 09/12/12 1423  NA 137 138 139 136  K 4.0 3.4* 3.0* 3.8  CL 95* 96 100 102  CO2 36* 36* 33* 27  GLUCOSE 77 99 72 87  BUN 11 10 12 12   CREATININE 0.62 0.57 0.62 0.58  CALCIUM 9.4 9.1 8.7 8.9  MG 1.9 1.7 -- --  AST -- -- -- 18  ALT -- -- -- 8  ALKPHOS -- -- -- 66  BILITOT -- -- -- 0.6   ------------------------------------------------------------------------------------------------------------------ estimated creatinine clearance is 70.6 ml/min (by C-G formula based on Cr of 0.62). ------------------------------------------------------------------------------------------------------------------ No results found for this basename: HGBA1C:2 in the last 72 hours ------------------------------------------------------------------------------------------------------------------ No results found for this basename: CHOL:2,HDL:2,LDLCALC:2,TRIG:2,CHOLHDL:2,LDLDIRECT:2 in the last 72 hours ------------------------------------------------------------------------------------------------------------------  Basename 09/13/12 1235  TSH 0.120*  T4TOTAL --  T3FREE 3.0  THYROIDAB --   ------------------------------------------------------------------------------------------------------------------ No results found for this basename: VITAMINB12:2,FOLATE:2,FERRITIN:2,TIBC:2,IRON:2,RETICCTPCT:2 in the last 72 hours  Coagulation  profile No results found for this basename: INR:5,PROTIME:5 in the last 168 hours  No results found for this basename: DDIMER:2 in the last 72 hours  Cardiac Enzymes  Lab 09/13/12 0602 09/13/12 0027 09/12/12 1938  CKMB -- -- --  TROPONINI <0.30 <0.30 <0.30  MYOGLOBIN -- -- --   ------------------------------------------------------------------------------------------------------------------ No components found with this basename: POCBNP:3

## 2012-09-15 NOTE — Progress Notes (Signed)
Met with patient, her daughter and granddaughter this evening. Patient sitting on bedside talking to family. Patient was referred to CSW for short term SNF but she states she is returning home at d/c. She states that she has a large, supportive family and neighbors and does not feel that she will need SNF. She is aware of Physical Therapy's recommendation for short term SNF but remains adamant that she will go home.  RNCM is aware.  No further CSW needs identified. CSW signing off.  Lorri Frederick. [place]  [phone]

## 2012-09-15 NOTE — Progress Notes (Signed)
[facility]  Patient Status: New  [facility] is providing the following services: PT  If patient discharges after hours, please call [phone].   Sonya Oliver 09/15/2012, 5:55 PM

## 2012-09-15 NOTE — Progress Notes (Signed)
Physical Therapy Treatment Patient Details Name: Sonya Oliver MRN: [id number] DOB: 1940/11/01 Today's Date: 09/15/2012 Time: 4098-1191 PT Time Calculation (min): 25 min  PT Assessment / Plan / Recommendation Comments on Treatment Session  Pt admitted with SOB, hypoxia and weakness. Pt reports left knee has ligamentous damage and is difficult to move at baseline. Pt educated for safety with RW and states she will benefit from RW use at home to be able to get her own mail. Pt will also benefit from RW, BSC and tub bench for safety as pt reports her toilet is too low and she has fallen getting into /out of tub. Will continue to follow.     Follow Up Recommendations  Home health PT;Supervision - Intermittent     Does the patient have the potential to tolerate intense rehabilitation     Barriers to Discharge        Equipment Recommendations  Rolling walker with 5" wheels;Other (comment) (tub bench, 3in1)    Recommendations for Other Services    Frequency     Plan Frequency remains appropriate;Discharge plan needs to be updated    Precautions / Restrictions Precautions Precautions: Fall   Pertinent Vitals/Pain No pain HR 83 with sats 95% on 2L O2 beginning of session 1st 175' ambulation on 2L with sats 90%, HR 74 Return 175' ambulation on RA with sats dropping to 79% and HR 80 Pt returned to 2L at rest with sats 93%    Mobility  Bed Mobility Bed Mobility: Not assessed Details for Bed Mobility Assistance: pt EOB on arrival Transfers Sit to Stand: 5: Supervision;From bed;From toilet Stand to Sit: 5: Supervision;To toilet;To chair/3-in-1;With armrests Details for Transfer Assistance: cueing for hand placement not to pull on RW with transfers Ambulation/Gait Ambulation/Gait Assistance: 5: Supervision Ambulation Distance (Feet): 350 Feet Assistive device: Rolling walker Ambulation/Gait Assistance Details: cueing to step into RW  Gait Pattern: Step-through pattern Stairs: No      Exercises General Exercises - Lower Extremity Short Arc Quad: AROM;Left;15 reps;Seated Long Arc Quad: AROM;Right;15 reps;Seated Hip Flexion/Marching: AROM;Both;15 reps;Seated   PT Diagnosis:    PT Problem List:   PT Treatment Interventions:     PT Goals Acute Rehab PT Goals PT Goal: Sit to Stand - Progress: Progressing toward goal PT Goal: Stand to Sit - Progress: Progressing toward goal PT Goal: Ambulate - Progress: Progressing toward goal PT Goal: Perform Home Exercise Program - Progress: Progressing toward goal  Visit Information  Last PT Received On: 09/15/12 Assistance Needed: +1    Subjective Data  Subjective: If I use the walker I can go out and get my mail Patient Stated Goal: return home   Cognition  Overall Cognitive Status: Appears within functional limits for tasks assessed/performed Arousal/Alertness: Awake/alert Orientation Level: Appears intact for tasks assessed Behavior During Session: [facility] for tasks performed    Balance     End of Session PT - End of Session Equipment Utilized During Treatment: Gait belt Activity Tolerance: Patient tolerated treatment well Patient left: in chair;with call bell/phone within reach Nurse Communication: Mobility status   GP     Delorse Lek 09/15/2012, 12:26 PM Delaney Meigs, PT [phone]

## 2012-09-16 LAB — GLUCOSE, CAPILLARY
Glucose-Capillary: 97 mg/dL (ref 70–99)
Glucose-Capillary: 78 mg/dL (ref 70–99)

## 2012-09-16 MED ORDER — FUROSEMIDE 40 MG PO TABS: 40.0000 mg | ORAL_TABLET | Freq: Every day | ORAL | Status: DC

## 2012-09-16 MED ORDER — METFORMIN HCL 500 MG PO TABS: 500.0000 mg | ORAL_TABLET | Freq: Two times a day (BID) | ORAL | Status: DC

## 2012-09-16 MED ORDER — CARVEDILOL 3.125 MG PO TABS: 3.1250 mg | ORAL_TABLET | Freq: Two times a day (BID) | ORAL | Status: DC

## 2012-09-16 MED ORDER — POTASSIUM CHLORIDE ER 10 MEQ PO TBCR: 20.0000 meq | EXTENDED_RELEASE_TABLET | Freq: Two times a day (BID) | ORAL | Status: DC

## 2012-09-16 NOTE — Discharge Summary (Signed)
[facility]                                                                                   Sonya Oliver, is a 72 y.o. female  DOB 14-Apr-1941  MRN [id number].  Admission date:  09/12/2012  Discharge Date:  09/16/2012  Primary MD  [name], MD  Admitting Physician  Joseph Art, DO  Admission Diagnosis  Acute congestive heart failure [428.0] Pulmonary edema [514] HEART FAILURE  Discharge Diagnosis     Active Problems:  Bladder mass  Hyperthyroidism  Anemia  SOB (shortness of breath)  Edema leg  Diabetes    Past Medical History  Diagnosis Date  . Hypertension   . Thyroid disease   . Diabetes mellitus     borderline diabetes  . UTI (lower urinary tract infection)   . 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 COPD (chronic obstructive pulmonary disease)   . CHF (congestive heart failure)   . Shortness of breath   . Arthritis     Past Surgical History  Procedure Date  . Abdominal hysterectomy   . Cystoscopy 03/23/2012    Procedure: CYSTOSCOPY;  Surgeon: Garnett Farm, MD;  Location: [facility];  Service: Urology;  Laterality: N/A;  . Transurethral resection of bladder tumor 03/23/2012    Procedure: TRANSURETHRAL RESECTION OF BLADDER TUMOR (TURBT);  Surgeon: Garnett Farm, MD;  Location: [facility];  Service: Urology;  Laterality: N/A;     Recommendations for primary care physician for things to follow:   Follow patient's BMP closely.   Discharge Diagnoses:   Active Problems:  Bladder mass  Hyperthyroidism  Anemia  SOB (shortness of breath)  Edema leg  Diabetes    Discharge Condition: Stable   Diet recommendation: See Discharge Instructions below   Consults none   History of present illness and  Hospital Course:  See H&P, Labs, Consult and Test reports for all details in brief, patient was admitted for  shortness of breath and massive peripheral edema due to acute on chronic diastolic CHF, EF 40% on echogram this  admission, patient was placed on Lasix drip for the last 3 days with good effect she is diuresed more than 12 L, shortness of breath is resolved and she stable on room air, she will be discharged home on a combination of beta blocker and Lasix, will request primary care physician to please monitor BMP potassium and weight closely.   She has history of chronic anemia, bladder mass, borderline diabetes mellitus which is diet-controlled her A1c was 5.8 and sugars were stable. She should follow with the urologist after discharge.    She also has history of hypothyroidism and TSH was slightly low this admission, she had stable free T3 and free T4, she is on methimazole which will be continued, will request PCP to repeat TSH in one week and ensure that patient follows with her endocrinologist in one week.     Lab Results  Component Value Date   TSH 0.120* 09/13/2012    Lab Results  Component Value Date   HGBA1C 5.8* 03/21/2012    CBG (last 3)  Basename 09/16/12 0601 09/15/12 2103 09/15/12 1623  GLUCAP 78 97 90       Today   Subjective:   Sonya Oliver today has no headache,no chest abdominal pain,no new weakness tingling or numbness, feels much better wants to go home today.  Objective:   Blood pressure 125/56, pulse 66, temperature 97.3 F (36.3 C), temperature source Oral, resp. rate 18, height 5\' 6"  (1.676 m), weight 87.907 kg (193 lb 12.8 oz), SpO2 90.00%.   Intake/Output Summary (Last 24 hours) at 09/16/12 0926 Last data filed at 09/16/12 0845  Gross per 24 hour  Intake   1060 ml  Output   4150 ml  Net  -3090 ml    Exam Awake Alert, Oriented *3, No new F.N deficits, Normal affect [place].AT,PERRAL Supple Neck,No JVD, No cervical lymphadenopathy appriciated.  Symmetrical Chest wall movement, Good air movement bilaterally, CTAB RRR,No Gallops,Rubs or new Murmurs, No Parasternal Heave +ve B.Sounds, Abd Soft, Non tender, No organomegaly appriciated, No rebound -guarding  or rigidity. No Cyanosis, Clubbing , 1+ edema, No new Rash or bruise  Data Review   Major procedures and Radiology Reports - PLEASE review detailed and final reports for all details in brief -    Dg Chest Port 1 View  09/12/2012  *RADIOLOGY REPORT*  Clinical Data: Shortness of breath, congestive heart failure  PORTABLE CHEST - 1 VIEW  Comparison: 09/08/2012  Findings: Cardiomegaly with pulmonary vascular congestion and possible mild interstitial edema.  Additional mild patchy bibasilar opacities, likely atelectasis.  No pleural effusion or pneumothorax.  IMPRESSION: Cardiomegaly with pulmonary vascular congestion and possible mild interstitial edema.   Original Report Authenticated By: Charline Bills, M.D.     Micro Results      No results found for this or any previous visit (from the past 240 hour(s)).   CBC w Diff: Lab Results  Component Value Date   WBC 4.5 09/12/2012   HGB 9.1* 09/12/2012   HCT 31.9* 09/12/2012   PLT 157 09/12/2012   LYMPHOPCT 16 02/23/2012   MONOPCT 12 02/23/2012   EOSPCT 1 02/23/2012   BASOPCT 0 02/23/2012    CMP: Lab Results  Component Value Date   NA 137 09/15/2012   K 4.0 09/15/2012   CL 95* 09/15/2012   CO2 36* 09/15/2012   BUN 11 09/15/2012   CREATININE 0.62 09/15/2012   PROT 8.2 09/12/2012   ALBUMIN 3.0* 09/12/2012   BILITOT 0.6 09/12/2012   ALKPHOS 66 09/12/2012   AST 18 09/12/2012   ALT 8 09/12/2012  .  Lab Results  Component Value Date   HGBA1C 5.8* 03/21/2012    CBG (last 3)   Basename 09/16/12 0601 09/15/12 2103 09/15/12 1623  GLUCAP 78 97 90      Discharge Instructions     Follow with your endocrinologist in one week and get her TSH checked.   Follow with Primary MD [name], MD in 3 days   Get CBC, CMP, TSH checked 3 days by Primary MD and again as instructed by your Primary MD.    Get Medicines reviewed and adjusted.  Please request your Prim.MD to go over all Hospital Tests and Procedure/Radiological results at the  follow up, please get all Hospital records sent to your Prim MD by signing hospital release before you go home.  Activity: As tolerated with Full fall precautions use walker/cane & assistance as needed   Diet:  Heart healthy low carbohydrate,  Fluid restriction 1.8 lit/day, Aspiration precautions.  Check your Weight same time  everyday, if you gain over 2 pounds, or you develop in leg swelling, experience more shortness of breath or chest pain, call your Primary MD immediately. Follow Cardiac Low Salt Diet and 1.8 lit/day fluid restriction.  Disposition Home    If you experience worsening of your admission symptoms, develop shortness of breath, life threatening emergency, suicidal or homicidal thoughts you must seek medical attention immediately by calling 911 or calling your MD immediately  if symptoms less severe.  You Must read complete instructions/literature along with all the possible adverse reactions/side effects for all the Medicines you take and that have been prescribed to you. Take any new Medicines after you have completely understood and accpet all the possible adverse reactions/side effects.   Do not drive and provide baby sitting services if your were admitted for syncope or siezures until you have seen by Primary MD or a Neurologist and advised to do so again.  Do not drive when taking Pain medications.    Do not take more than prescribed Pain, Sleep and Anxiety Medications  Special Instructions: If you have smoked or chewed Tobacco  in the last 2 yrs please stop smoking, stop any regular Alcohol  and or any Recreational drug use.  Wear Seat belts while driving.   Follow-up Information    Follow up with [name], MD. Schedule an appointment as soon as possible for a visit in 1 week.   Contact information:   [address] [place] [address] [phone]       Follow up with Your endocrinologist in one week and get TSH  checked. Schedule an appointment as soon as possible for a visit in 1 week.      Follow up with [facility]. (Home Health Physical Therapy and Home Continuous Oxygen)    Contact information:   [phone]           Discharge Medications     Medication List     As of 09/16/2012  9:26 AM    START taking these medications         carvedilol 3.125 MG tablet   Commonly known as: COREG   Take 1 tablet (3.125 mg total) by mouth 2 (two) times daily with a meal.      furosemide 40 MG tablet   Commonly known as: LASIX   Take 1 tablet (40 mg total) by mouth daily.      potassium chloride 10 MEQ tablet   Commonly known as: K-DUR   Take 2 tablets (20 mEq total) by mouth 2 (two) times daily.      CONTINUE taking these medications         methimazole 10 MG tablet   Commonly known as: TAPAZOLE      STOP taking these medications         losartan-hydrochlorothiazide 100-25 MG per tablet   Commonly known as: HYZAAR          Where to get your medications    These are the prescriptions that you need to pick up. We sent them to a specific pharmacy, so you will need to go there to get them.   [id number] - [place], [place] - [address]    [address] [place] [address]    Phone: [phone]        carvedilol 3.125 MG tablet   furosemide 40 MG tablet   potassium chloride 10 MEQ tablet  Total Time in preparing paper work, data evaluation and todays exam - 35 minutes  Leroy Sea M.D on 09/16/2012 at 9:26 AM  [facility] Office  [phone]

## 2012-09-16 NOTE — Progress Notes (Signed)
All d/c instructions explained and given to pt.  Verbalized understanding.  D/C home via w/c.  Amanda Pea, Charity fundraiser.

## 2012-09-18 NOTE — Care Management Note (Signed)
    Page 1 of 2   09/18/2012     7:52:27 AM   CARE MANAGEMENT NOTE 09/18/2012  Patient:  Sonya Oliver, Sonya Oliver   Account Number:  [id number]  Date Initiated:  09/15/2012  Documentation initiated by:  Tera Mater  Subjective/Objective Assessment:   71yo female admitted with HF. Pt. lives at home with daughter in [place].     Action/Plan:   In to speak with pt. about [facility] services.  Pt. is interested in [facility] and chose [facility].   Anticipated DC Date:  09/16/2012   Anticipated DC Plan:  HOME W HOME HEALTH SERVICES      DC Planning Services  CM consult      [facility] Site Choice  Resumption Of Svcs/PTA Provider   Choice offered to / List presented to:     DME arranged  [facility]      DME agency  [facility]     HH arranged  HH-2 PT      [facility] agency  [facility]   Status of service:  Completed, signed off Medicare Important Message given?   (If response is "NO", the following Medicare IM given date fields will be blank) Date Medicare IM given:   Date Additional Medicare IM given:    Discharge Disposition:  HOME W HOME HEALTH SERVICES  Per UR Regulation:  Reviewed for med. necessity/level of care/duration of stay  If discussed at Long Length of Stay Meetings, dates discussed:    Comments:  09/16/12 1620 TC to Lupita Leash, with [facility] to give referral for [facility] PT.  TC to [facility], with [facility], to give referral for home continuous oxygen.  Pt. to dc home tomorrow. Tera Mater, RN, BSN NCM [phone]

## 2012-11-17 ENCOUNTER — Telehealth: Payer: Self-pay

## 2012-11-17 NOTE — Telephone Encounter (Signed)
Yes she should be in the program. Please expedite.

## 2012-11-17 NOTE — Telephone Encounter (Signed)
Notified karin and left message as requested from Dr Modesto Charon

## 2012-11-17 NOTE — Telephone Encounter (Signed)
Lucienne Minks from [facility] called wants to know does she CHF and does she need to be in heart failure mointor progam . Please  Call.

## 2012-12-14 ENCOUNTER — Other Ambulatory Visit: Payer: Self-pay | Admitting: Urology

## 2012-12-15 MED ORDER — MITOMYCIN CHEMO FOR BLADDER INSTILLATION 40 MG: 40.0000 mg | Freq: Once | INTRAVENOUS | Status: DC

## 2012-12-19 ENCOUNTER — Encounter: Payer: Self-pay | Admitting: Family Medicine

## 2012-12-19 ENCOUNTER — Ambulatory Visit (INDEPENDENT_AMBULATORY_CARE_PROVIDER_SITE_OTHER): Payer: Medicare Other | Admitting: Family Medicine

## 2012-12-19 VITALS — BP 138/62 | HR 70 | Temp 98.3°F | Wt 187.2 lb

## 2012-12-19 DIAGNOSIS — I10 Essential (primary) hypertension: Secondary | ICD-10-CM

## 2012-12-19 DIAGNOSIS — J449 Chronic obstructive pulmonary disease, unspecified: Secondary | ICD-10-CM | POA: Insufficient documentation

## 2012-12-19 DIAGNOSIS — E0590 Thyrotoxicosis, unspecified without thyrotoxic crisis or storm: Secondary | ICD-10-CM

## 2012-12-19 DIAGNOSIS — I509 Heart failure, unspecified: Secondary | ICD-10-CM | POA: Insufficient documentation

## 2012-12-19 DIAGNOSIS — D62 Acute posthemorrhagic anemia: Secondary | ICD-10-CM

## 2012-12-19 DIAGNOSIS — R600 Localized edema: Secondary | ICD-10-CM

## 2012-12-19 DIAGNOSIS — E559 Vitamin D deficiency, unspecified: Secondary | ICD-10-CM | POA: Insufficient documentation

## 2012-12-19 DIAGNOSIS — E119 Type 2 diabetes mellitus without complications: Secondary | ICD-10-CM

## 2012-12-19 DIAGNOSIS — N3289 Other specified disorders of bladder: Secondary | ICD-10-CM

## 2012-12-19 DIAGNOSIS — R609 Edema, unspecified: Secondary | ICD-10-CM

## 2012-12-19 LAB — POCT CBC
Granulocyte percent: 51.2 %G (ref 37–80)
HCT, POC: 37.6 % — AB (ref 37.7–47.9)
Hemoglobin: 11.8 g/dL — AB (ref 12.2–16.2)
Lymph, poc: 2.1 (ref 0.6–3.4)
MCH, POC: 22.3 pg — AB (ref 27–31.2)
MCHC: 31.5 g/dL — AB (ref 31.8–35.4)
MCV: 71 fL — AB (ref 80–97)
MPV: 8 fL (ref 0–99.8)
POC Granulocyte: 2.5 (ref 2–6.9)
POC LYMPH PERCENT: 44.4 %L (ref 10–50)
Platelet Count, POC: 165 10*3/uL (ref 142–424)
RBC: 5.3 M/uL (ref 4.04–5.48)
RDW, POC: 25.1 %
WBC: 4.8 10*3/uL (ref 4.6–10.2)

## 2012-12-19 LAB — POCT GLYCOSYLATED HEMOGLOBIN (HGB A1C): Hemoglobin A1C: 5.5

## 2012-12-19 NOTE — Progress Notes (Signed)
Patient ID: Sonya Oliver, female   DOB: 03-17-1941, 72 y.o.   MRN: [id number] SUBJECTIVE: HPI: Here for follow up; doing good now. Has surgery scheduled for mid May. has not had shortness of breath has not had wheezing has had a response to medications Medications used: has had swelling of the feet has not had chest pain has not had exposure tobacco smoke or other triggers. has had a flu shot has had a pneumonia shot 03/2011 has not had fever has not had purulent phlegm has not had blood in the sputum, has not had weight loss has not had any recent medication changes.  Patient is here for follow up of Diabetes Mellitus.Symptoms of DM:has had Nocturia ,admits toUrinary Frequency ,denies Blurred vision ,deniesDizziness,denies.Dysuria,deniesparesthesias, deniesextremity pain or ulcers.[name] pain. .has not hadan annual eye exam. do check the feet. doescheck CBGs. Average CBG:________.[name] deniesto episodes of hypoglycemia. doeshave an emergency hypoglycemic plan. admits toCompliance with medications. admits toProblems with medications.  Congestive Heart Failure Patient presents for re-evaluation of congestive heart failure. Patient's current complaints are none. She denies chest pain. She states she is compliant most of the time with her medications. She states she is compliant most of the time with her diet.  PMH/PSH: reviewed/updated in Epic  SH/FH: reviewed/updated in Epic  Allergies: reviewed/updated in Epic  Medications: reviewed/updated in Epic  Immunizations: reviewed/updated in Epic  ROS: As above in the HPI. All other systems are stable or negative.  OBJECTIVE: APPEARANCE:  Patient in no acute distress.The patient appeared well nourished and normally developed. Acyanotic. Waist: VITAL SIGNS:  SKIN: warm and  Dry without overt rashes, tattoos and scars  HEAD and Neck: without JVD, Head and scalp: normal Eyes:No scleral icterus. Fundi normal, eye  movements normal. Ears: Auricle normal, canal normal, Tympanic membranes normal, insufflation normal. Nose: normal Throat: normal Neck & thyroid: normal  CHEST & LUNGS: Chest wall: normal Lungs: Clear  CVS: Reveals the PMI to be normally located. Regular rhythm, First and Second Heart sounds are normal,  absence of murmurs, rubs or gallops. Peripheral vasculature: Radial pulses: normal Dorsal pedis pulses: normal Posterior pulses: normal  ABDOMEN:  Appearance: normal Benign,, no organomegaly, no masses, no Abdominal Aortic enlargement. No Guarding , no rebound. No Bruits. Bowel sounds: normal  RECTAL: N/A GU: N/A  EXTREMETIES: nonedematous. Both Femoral and Pedal pulses are normal.  MUSCULOSKELETAL:  Spine: normal Joints: intact  NEUROLOGIC: oriented to time,place and person; nonfocal. Strength is normal Sensory is normal Reflexes are normal Cranial Nerves are normal.  ASSESSMENT: Anemia associated with acute blood loss - Plan: POCT CBC, COMPLETE METABOLIC PANEL WITH GFR  Bladder mass  Diabetes - Plan: COMPLETE METABOLIC PANEL WITH GFR, POCT glycosylated hemoglobin (Hb A1C)  Edema leg - Plan: COMPLETE METABOLIC PANEL WITH GFR  Hyperthyroidism - Plan: COMPLETE METABOLIC PANEL WITH GFR  CHF (congestive heart failure) - Plan: COMPLETE METABOLIC PANEL WITH GFR, Brain natriuretic peptide  COPD (chronic obstructive pulmonary disease)  Unspecified vitamin D deficiency - Plan: Vitamin D 25 hydroxy  HTN (hypertension)    PLAN:  Orders Placed This Encounter  Procedures  . COMPLETE METABOLIC PANEL WITH GFR  . Brain natriuretic peptide  . Vitamin D 25 hydroxy  . POCT CBC  . POCT glycosylated hemoglobin (Hb A1C)   Results for orders placed in visit on 12/19/12 (from the past 24 hour(s))  POCT CBC     Status: Abnormal   Collection Time    12/19/12 12:43 PM      Result Value  Range   WBC 4.8  4.6 - 10.2 K/uL   Lymph, poc 2.1  0.6 - 3.4   POC LYMPH  PERCENT 44.4  10 - 50 %L   POC Granulocyte 2.5  2 - 6.9   Granulocyte percent 51.2  37 - 80 %G   RBC 5.3  4.04 - 5.48 M/uL   Hemoglobin 11.8 (*) 12.2 - 16.2 g/dL   HCT, POC 40.9 (*) 81.1 - 47.9 %   MCV 71.0 (*) 80 - 97 fL   MCH, POC 22.3 (*) 27 - 31.2 pg   MCHC 31.5 (*) 31.8 - 35.4 g/dL   RDW, POC 91.4     Platelet Count, POC 165.0  142 - 424 K/uL   MPV 8.0  0 - 99.8 fL  POCT GLYCOSYLATED HEMOGLOBIN (HGB A1C)     Status: None   Collection Time    12/19/12 12:48 PM      Result Value Range   Hemoglobin A1C 5.5     Meds ordered this encounter  Medications  . ONETOUCH VERIO test strip    Sig:   . ONETOUCH DELICA LANCETS 33G MISC    Sig:   . metFORMIN (GLUCOPHAGE) 500 MG tablet    Sig: Take 500 mg by mouth 2 (two) times daily with a meal.   . metroNIDAZOLE (FLAGYL) 500 MG tablet    Sig:   . KLOR-CON M20 20 MEQ tablet    Sig: 20 mEq daily.    Wished her well with the planned surgery. Continue the same medication regimen. RTC in 3 months.   [name], M.D.

## 2012-12-20 LAB — COMPLETE METABOLIC PANEL WITH GFR
ALT: <8 U/L (ref 0–35)
AST: 14 U/L (ref 0–37)
Albumin: 3.8 g/dL (ref 3.5–5.2)
Alkaline Phosphatase: 63 U/L (ref 39–117)
BUN: 13 mg/dL (ref 6–23)
CO2: 29 mEq/L (ref 19–32)
Calcium: 9.6 mg/dL (ref 8.4–10.5)
Chloride: 102 mEq/L (ref 96–112)
Creat: 0.62 mg/dL (ref 0.50–1.10)
GFR, Est African American: >89 mL/min
GFR, Est Non African American: >89 mL/min
Glucose, Bld: 85 mg/dL (ref 70–99)
Potassium: 4.2 mEq/L (ref 3.5–5.3)
Sodium: 137 mEq/L (ref 135–145)
Total Bilirubin: 0.7 mg/dL (ref 0.3–1.2)
Total Protein: 8 g/dL (ref 6.0–8.3)

## 2012-12-20 LAB — VITAMIN D 25 HYDROXY (VIT D DEFICIENCY, FRACTURES): Vit D, 25-Hydroxy: 18 ng/mL — ABNORMAL LOW (ref 30–89)

## 2012-12-20 LAB — BRAIN NATRIURETIC PEPTIDE: Brain Natriuretic Peptide: 77.2 pg/mL (ref 0.0–100.0)

## 2012-12-24 NOTE — Progress Notes (Signed)
Quick Note:  Labs abnormal. The vitamin D is too low. Take OTC Vitamin D 4,000 IU daily . The rest is at goal. And the other medications should be continued at the present dose. ______

## 2012-12-27 DIAGNOSIS — J449 Chronic obstructive pulmonary disease, unspecified: Secondary | ICD-10-CM

## 2012-12-27 DIAGNOSIS — IMO0001 Reserved for inherently not codable concepts without codable children: Secondary | ICD-10-CM

## 2012-12-27 DIAGNOSIS — I509 Heart failure, unspecified: Secondary | ICD-10-CM

## 2012-12-27 DIAGNOSIS — E119 Type 2 diabetes mellitus without complications: Secondary | ICD-10-CM

## 2013-01-04 ENCOUNTER — Encounter (HOSPITAL_BASED_OUTPATIENT_CLINIC_OR_DEPARTMENT_OTHER): Payer: Self-pay | Admitting: *Deleted

## 2013-01-04 NOTE — Progress Notes (Addendum)
NPO AFTER MN. ARRIVES AT 0645. NEEDS ISTAT AND CXR. CURRENT EKG IN CHART AND EPIC. WILL TAKE COREG AM OF SURG W/ SIP OF WATER.  REVIEWED CHART W/ DR EWELL MDA. STATES OK TO PROCEED AND HAVE PT BRING O2 TANK IN CASE MAY NEED TO DISCHARGE W/ O2.  CALLED AND SPOKE W/ PT ., AND IS BRINGING IT DOS.

## 2013-01-05 NOTE — H&P (Signed)
History of Present Illness                 Transitional cell carcinoma of the bladder: She developed hematuria and underwent a CT scan on 02/23/12. This revealed a 7 cm mass involving the floor of the bladder. Her CT scan was done without contrast however it did not reveal evidence of hydronephrosis or adenopathy. She continued to have gross hematuria and required hospitalization due to anemia. I therefore proceeded with transurethral resection of her bladder tumor on 03/21/12. At that time I found a tumor involving primarily the right wall of the bladder but also left wall and entire trigone.  Pathology: Papillary TCCa (Ta,G3) She underwent an induction course of full-strength BCG completed in 10/13.  Urinary incontinence: She wears 3 pads per day. It is currently unclear whether this is stress or urge incontinence by her history.    Interval history: She is doing well with no new urologic complaints today. Specifically she's not having any new irritative symptoms or hematuria.   Past Medical History Problems  1. History of  Arthritis V13.4 2. History of  Diabetes Mellitus 250.00 3. History of  Hypertension 401.9  Surgical History Problems  1. History of  Cystoscopy With Fulguration Large Lesion (Over 5cm) 2. History of  Hysterectomy V45.77  Current Meds 1. Losartan Potassium-HCTZ 100-25 MG Oral Tablet; Therapy: (Recorded:02Jul2013) to 2. Methimazole 10 MG Oral Tablet; Therapy: (Recorded:02Jul2013) to  Allergies Medication  1. No Known Drug Allergies  Social History Problems  1. Caffeine Use 2. Former Smoker V15.82 1ppdxmany years 3. Marital History - Widowed Denied  4. History of  Alcohol Use  ROS: All 12 systems neg. except as above  Vitals Vital Signs  Blood Pressure: 134 / 59 Temperature: 98.9 F Heart Rate: 73 General: A+Ox3, NAD Eyes: mild drooping over left eye  ENT: wnl  Cardiovascular: rrr, no murmur  Respiratory: crackles b/l, moderate increase in the work of  breathing  Abdomen: +BS, soft, NT/ND  Skin: +chronic changes in lower ext with edema  Musculoskeletal: moves all 4 extremitites  Psychiatric: no SI/no HI  Neurologic: CH 2-12 intact  The following images/tracing/specimen were independently visualized:  Renal ultrasound: The right kidney measured 11 cm with 13 mm of parenchyma and the left kidney measured 10.8 cm with 15 mm of parenchyma. Both kidneys appear to have normal cortical renal sinus echo patterns without evidence of mass, stones or hydronephrosis. The ureters could not be visualized and the bladder had 27 cc residual.  The following clinical lab reports were reviewed:  the urine did have some red and white cells although there was a great deal of epithelial cells indicating contamination.   Assessment Assessed  1. 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 We therefore discussed the need to resect this as it was too large for office fulguration. I have again discussed the procedure with her. One of the risks, complications and alternatives. She understands and has elected to proceed.  Her urine had no evidence of Trichomonas today.   Plan    She'll be scheduled for TURBT and postoperative mitomycin.

## 2013-01-08 ENCOUNTER — Encounter (HOSPITAL_BASED_OUTPATIENT_CLINIC_OR_DEPARTMENT_OTHER)
Admission: RE | Disposition: A | Discharge status: Home / Self Care | Payer: Self-pay | Source: Ambulatory Visit | Attending: Urology

## 2013-01-08 ENCOUNTER — Ambulatory Visit (HOSPITAL_BASED_OUTPATIENT_CLINIC_OR_DEPARTMENT_OTHER)
Admission: RE | Admit: 2013-01-08 | Discharge: 2013-01-08 | Disposition: A | Discharge status: Home / Self Care | Payer: Medicare Other | Source: Ambulatory Visit | Attending: Urology | Admitting: Urology

## 2013-01-08 ENCOUNTER — Ambulatory Visit (HOSPITAL_COMMUNITY): Payer: Medicare Other

## 2013-01-08 ENCOUNTER — Encounter (HOSPITAL_BASED_OUTPATIENT_CLINIC_OR_DEPARTMENT_OTHER): Payer: Self-pay | Admitting: *Deleted

## 2013-01-08 ENCOUNTER — Ambulatory Visit (HOSPITAL_BASED_OUTPATIENT_CLINIC_OR_DEPARTMENT_OTHER): Payer: Medicare Other | Admitting: Anesthesiology

## 2013-01-08 ENCOUNTER — Encounter (HOSPITAL_BASED_OUTPATIENT_CLINIC_OR_DEPARTMENT_OTHER): Payer: Self-pay | Admitting: Anesthesiology

## 2013-01-08 DIAGNOSIS — J449 Chronic obstructive pulmonary disease, unspecified: Secondary | ICD-10-CM | POA: Insufficient documentation

## 2013-01-08 DIAGNOSIS — R32 Unspecified urinary incontinence: Secondary | ICD-10-CM | POA: Insufficient documentation

## 2013-01-08 DIAGNOSIS — E119 Type 2 diabetes mellitus without complications: Secondary | ICD-10-CM | POA: Insufficient documentation

## 2013-01-08 DIAGNOSIS — I10 Essential (primary) hypertension: Secondary | ICD-10-CM | POA: Insufficient documentation

## 2013-01-08 DIAGNOSIS — Z79899 Other long term (current) drug therapy: Secondary | ICD-10-CM | POA: Insufficient documentation

## 2013-01-08 DIAGNOSIS — C675 Malignant neoplasm of bladder neck: Secondary | ICD-10-CM | POA: Insufficient documentation

## 2013-01-08 DIAGNOSIS — N3289 Other specified disorders of bladder: Secondary | ICD-10-CM

## 2013-01-08 DIAGNOSIS — Z87891 Personal history of nicotine dependence: Secondary | ICD-10-CM | POA: Insufficient documentation

## 2013-01-08 DIAGNOSIS — J4489 Other specified chronic obstructive pulmonary disease: Secondary | ICD-10-CM | POA: Insufficient documentation

## 2013-01-08 HISTORY — DX: Nocturia: R35.1

## 2013-01-08 HISTORY — DX: Idiopathic sleep related nonobstructive alveolar hypoventilation: G47.34

## 2013-01-08 HISTORY — DX: Emphysema, unspecified: J43.9

## 2013-01-08 HISTORY — PX: TRANSURETHRAL RESECTION OF BLADDER TUMOR: SHX2575

## 2013-01-08 HISTORY — DX: Personal history of other diseases of the respiratory system: Z87.09

## 2013-01-08 HISTORY — DX: Other forms of dyspnea: R06.09

## 2013-01-08 LAB — POCT I-STAT 4, (NA,K, GLUC, HGB,HCT)
Glucose, Bld: 86 mg/dL (ref 70–99)
HCT: 44 % (ref 36.0–46.0)
Hemoglobin: 15 g/dL (ref 12.0–15.0)
Potassium: 4.6 mEq/L (ref 3.5–5.1)
Sodium: 141 mEq/L (ref 135–145)

## 2013-01-08 SURGERY — TURBT (TRANSURETHRAL RESECTION OF BLADDER TUMOR)
Anesthesia: General | Site: Bladder | Wound class: Clean Contaminated

## 2013-01-08 MED ORDER — SODIUM CHLORIDE 0.9 % IR SOLN
Status: DC | PRN
  Administered 2013-01-08: 6000 mL via INTRAVESICAL

## 2013-01-08 MED ORDER — MITOMYCIN CHEMO FOR BLADDER INSTILLATION 40 MG
40.0000 mg | Freq: Once | INTRAVENOUS | Status: AC
  Administered 2013-01-08: 40 mg via INTRAVESICAL
  Filled 2013-01-08: qty 40

## 2013-01-08 MED ORDER — EPHEDRINE SULFATE 50 MG/ML IJ SOLN
INTRAMUSCULAR | Status: DC | PRN
  Administered 2013-01-08: 10 mg via INTRAVENOUS

## 2013-01-08 MED ORDER — HYDROMORPHONE HCL PF 1 MG/ML IJ SOLN
0.2500 mg | INTRAMUSCULAR | Status: DC | PRN
  Filled 2013-01-08: qty 1

## 2013-01-08 MED ORDER — OXYCODONE HCL 5 MG/5ML PO SOLN
5.0000 mg | Freq: Once | ORAL | Status: DC | PRN
  Filled 2013-01-08: qty 5

## 2013-01-08 MED ORDER — PROMETHAZINE HCL 25 MG/ML IJ SOLN
6.2500 mg | INTRAMUSCULAR | Status: DC | PRN
  Filled 2013-01-08: qty 1

## 2013-01-08 MED ORDER — CIPROFLOXACIN IN D5W 200 MG/100ML IV SOLN
200.0000 mg | INTRAVENOUS | Status: AC
  Administered 2013-01-08: 200 mg via INTRAVENOUS
  Filled 2013-01-08: qty 100

## 2013-01-08 MED ORDER — DEXAMETHASONE SODIUM PHOSPHATE 4 MG/ML IJ SOLN
INTRAMUSCULAR | Status: DC | PRN
  Administered 2013-01-08: 4 mg via INTRAVENOUS

## 2013-01-08 MED ORDER — OXYCODONE HCL 5 MG PO TABS
5.0000 mg | ORAL_TABLET | Freq: Once | ORAL | Status: DC | PRN
  Filled 2013-01-08: qty 1

## 2013-01-08 MED ORDER — PROPOFOL 10 MG/ML IV BOLUS
INTRAVENOUS | Status: DC | PRN
Start: 2013-01-08 — End: 2013-01-08
  Administered 2013-01-08: 150 mg via INTRAVENOUS

## 2013-01-08 MED ORDER — FENTANYL CITRATE 0.05 MG/ML IJ SOLN
INTRAMUSCULAR | Status: DC | PRN
  Administered 2013-01-08: 25 ug via INTRAVENOUS
  Administered 2013-01-08: 50 ug via INTRAVENOUS
  Administered 2013-01-08: 25 ug via INTRAVENOUS

## 2013-01-08 MED ORDER — PHENAZOPYRIDINE HCL 200 MG PO TABS
200.0000 mg | ORAL_TABLET | Freq: Three times a day (TID) | ORAL | Status: DC | PRN
Start: 2013-01-08 — End: 2013-06-19

## 2013-01-08 MED ORDER — LIDOCAINE HCL (CARDIAC) 20 MG/ML IV SOLN
INTRAVENOUS | Status: DC | PRN
  Administered 2013-01-08: 50 mg via INTRAVENOUS

## 2013-01-08 MED ORDER — PHENAZOPYRIDINE HCL 200 MG PO TABS
200.0000 mg | ORAL_TABLET | Freq: Once | ORAL | Status: DC
  Filled 2013-01-08: qty 1

## 2013-01-08 MED ORDER — MEPERIDINE HCL 25 MG/ML IJ SOLN
6.2500 mg | INTRAMUSCULAR | Status: DC | PRN
  Filled 2013-01-08: qty 1

## 2013-01-08 MED ORDER — ACETAMINOPHEN 10 MG/ML IV SOLN
1000.0000 mg | Freq: Once | INTRAVENOUS | Status: DC | PRN
  Filled 2013-01-08: qty 100

## 2013-01-08 MED ORDER — HYDROCODONE-ACETAMINOPHEN 10-325 MG PO TABS: 1.0000 | ORAL_TABLET | ORAL | Status: DC | PRN

## 2013-01-08 MED ORDER — ONDANSETRON HCL 4 MG/2ML IJ SOLN
INTRAMUSCULAR | Status: DC | PRN
  Administered 2013-01-08: 4 mg via INTRAVENOUS

## 2013-01-08 MED ORDER — LACTATED RINGERS IV SOLN
INTRAVENOUS | Status: DC
  Administered 2013-01-08 (×2): via INTRAVENOUS
  Filled 2013-01-08: qty 1000

## 2013-01-08 SURGICAL SUPPLY — 30 items
BAG DRAIN URO-CYSTO SKYTR STRL (DRAIN) ×2 IMPLANT
BAG URINE DRAINAGE (UROLOGICAL SUPPLIES) IMPLANT
BAG URINE LEG 19OZ MD ST LTX (BAG) IMPLANT
CANISTER SUCT LVC 12 LTR MEDI- (MISCELLANEOUS) ×4 IMPLANT
CATH FOLEY 2WAY SLVR  5CC 20FR (CATHETERS) ×1
CATH FOLEY 2WAY SLVR  5CC 22FR (CATHETERS)
CATH FOLEY 2WAY SLVR  5CC 24FR (CATHETERS) ×1
CATH FOLEY 2WAY SLVR 5CC 20FR (CATHETERS) ×1 IMPLANT
CATH FOLEY 2WAY SLVR 5CC 22FR (CATHETERS) IMPLANT
CATH FOLEY 2WAY SLVR 5CC 24FR (CATHETERS) ×1 IMPLANT
CLOTH BEACON ORANGE TIMEOUT ST (SAFETY) ×2 IMPLANT
DRAPE CAMERA CLOSED 9X96 (DRAPES) ×2 IMPLANT
ELECT BUTTON HF 24-28F 2 30DE (ELECTRODE) IMPLANT
ELECT LOOP MED HF 24F 12D (CUTTING LOOP) ×2 IMPLANT
ELECT REM PT RETURN 9FT ADLT (ELECTROSURGICAL) ×2
ELECT RESECT VAPORIZE 12D CBL (ELECTRODE) ×2 IMPLANT
ELECTRODE REM PT RTRN 9FT ADLT (ELECTROSURGICAL) ×1 IMPLANT
EVACUATOR MICROVAS BLADDER (UROLOGICAL SUPPLIES) IMPLANT
GLOVE BIO SURGEON STRL SZ8 (GLOVE) ×2 IMPLANT
GLOVE ECLIPSE 7.0 STRL STRAW (GLOVE) ×2 IMPLANT
GLOVE INDICATOR 6.5 STRL GRN (GLOVE) ×2 IMPLANT
GOWN PREVENTION PLUS LG XLONG (DISPOSABLE) ×2 IMPLANT
GOWN STRL REIN XL XLG (GOWN DISPOSABLE) ×2 IMPLANT
HOLDER FOLEY CATH W/STRAP (MISCELLANEOUS) IMPLANT
IV NS IRRIG 3000ML ARTHROMATIC (IV SOLUTION) IMPLANT
KIT ASPIRATION TUBING (SET/KITS/TRAYS/PACK) ×2 IMPLANT
PACK CYSTOSCOPY (CUSTOM PROCEDURE TRAY) ×2 IMPLANT
PLUG CATH AND CAP STER (CATHETERS) ×2 IMPLANT
SET ASPIRATION TUBING (TUBING) IMPLANT
WATER STERILE IRR 3000ML UROMA (IV SOLUTION) IMPLANT

## 2013-01-08 NOTE — Interval H&P Note (Signed)
History and Physical Interval Note:  01/08/2013 8:00 AM  Sonya Oliver  has presented today for surgery, with the diagnosis of Bladder Tumor  The various methods of treatment have been discussed with the patient and family. After consideration of risks, benefits and other options for treatment, the patient has consented to  Procedure(s) with comments: TRANSURETHRAL RESECTION OF BLADDER TUMOR (TURBT) (N/A) - 60 mins req for this case  INTRAVESICAL CHEMO  CAMERA GYRUS  as a surgical intervention .  The patient's history has been reviewed, patient examined, no change in status, stable for surgery.  I have reviewed the patient's chart and labs.  Questions were answered to the patient's satisfaction.     Garnett Farm

## 2013-01-08 NOTE — Transfer of Care (Signed)
Immediate Anesthesia Transfer of Care Note  Patient: Sonya Oliver  Procedure(s) Performed: Procedure(s) (LRB): TRANSURETHRAL RESECTION OF BLADDER TUMOR (TURBT) (N/A)  Patient Location: PACU  Anesthesia Type: General  Level of Consciousness: awake, alert  and oriented  Airway & Oxygen Therapy: Patient Spontanous Breathing and Patient connected to face mask oxygen  Post-op Assessment: Report given to PACU RN and Post -op Vital signs reviewed and stable  Post vital signs: Reviewed and stable  Complications: No apparent anesthesia complications

## 2013-01-08 NOTE — Anesthesia Preprocedure Evaluation (Addendum)
Anesthesia Evaluation  Patient identified by MRN, date of birth, ID band Patient awake    Reviewed: Allergy & Precautions, H&P , NPO status , Patient's Chart, lab work & pertinent test results, reviewed documented beta blocker date and time   Airway Mallampati: II TM Distance: >3 FB Neck ROM: Full    Dental no notable dental hx. (+) Edentulous Upper, Upper Dentures and Dental Advisory Given   Pulmonary shortness of breath and with exertion, COPD COPD inhaler,  breath sounds clear to auscultation  Pulmonary exam normal       Cardiovascular hypertension, Pt. on medications and Pt. on home beta blockers +CHF negative cardio ROS  Rhythm:Regular Rate:Normal  Echo 08/2012 - Left ventricle: The cavity size was mildly dilated. Wall   thickness was normal. Systolic function was normal. The   estimated ejection fraction was in the range of 55% to   60%. Wall motion was normal; there were no regional wall  motion abnormalities. - Mitral valve: Mild regurgitation. - Left atrium: The atrium was mildly dilated. - Right ventricle: The cavity size was mildly dilated. Wall   thickness was normal. - Right atrium: The atrium was mildly dilated. - Pulmonary arteries: Systolic pressure was moderately   increased. PA peak pressure: 57mm Hg (S).    Neuro/Psych negative neurological ROS  negative psych ROS   GI/Hepatic negative GI ROS, Neg liver ROS,   Endo/Other  diabetes, Type 2, Oral Hypoglycemic AgentsHyperthyroidism   Renal/GU negative Renal ROS     Musculoskeletal negative musculoskeletal ROS (+)   Abdominal   Peds  Hematology negative hematology ROS (+)   Anesthesia Other Findings   Reproductive/Obstetrics negative OB ROS                          Anesthesia Physical  Anesthesia Plan  ASA: III  Anesthesia Plan: General   Post-op Pain Management:    Induction: Intravenous  Airway Management  Planned: LMA  Additional Equipment:   Intra-op Plan:   Post-operative Plan: Extubation in OR  Informed Consent: I have reviewed the patients History and Physical, chart, labs and discussed the procedure including the risks, benefits and alternatives for the proposed anesthesia with the patient or authorized representative who has indicated his/her understanding and acceptance.   Dental advisory given  Plan Discussed with: CRNA  Anesthesia Plan Comments:         Anesthesia Quick Evaluation

## 2013-01-08 NOTE — Anesthesia Procedure Notes (Signed)
Procedure Name: LMA Insertion Date/Time: 01/08/2013 8:38 AM Performed by: Norva Pavlov Pre-anesthesia Checklist: Patient identified, Emergency Drugs available, Suction available and Patient being monitored Patient Re-evaluated:Patient Re-evaluated prior to inductionOxygen Delivery Method: Circle System Utilized Preoxygenation: Pre-oxygenation with 100% oxygen Intubation Type: IV induction Ventilation: Mask ventilation without difficulty LMA: LMA inserted LMA Size: 4.0 Number of attempts: 1 Airway Equipment and Method: bite block Placement Confirmation: positive ETCO2 Tube secured with: Tape Dental Injury: Teeth and Oropharynx as per pre-operative assessment

## 2013-01-08 NOTE — Anesthesia Postprocedure Evaluation (Signed)
Anesthesia Post Note  Patient: Sonya Oliver  Procedure(s) Performed: Procedure(s) (LRB): TRANSURETHRAL RESECTION OF BLADDER TUMOR (TURBT) (N/A)  Anesthesia type: General  Patient location: PACU  Post pain: Pain level controlled  Post assessment: Post-op Vital signs reviewed  Last Vitals: BP 150/57  Pulse 59  Temp(Src) 37.1 C (Oral)  Resp 17  Ht 5\' 6"  (1.676 m)  Wt 183 lb (83.008 kg)  BMI 29.55 kg/m2  SpO2 94%  Post vital signs: Reviewed  Level of consciousness: sedated  Complications: No apparent anesthesia complications

## 2013-01-08 NOTE — Op Note (Signed)
PATIENT:  Sonya Oliver  PRE-OPERATIVE DIAGNOSIS: Bladder tumor  POST-OPERATIVE DIAGNOSIS: Same  PROCEDURE:  Procedure(s): 1. TRANSURETHRAL RESECTION OF BLADDER TUMOR (TURBT) (1.7cm.) 2. Instillation of intravesical chemotherapy (mitomycin-C)  SURGEON:  Surgeon(s): Garnett Farm  ANESTHESIA:   General  EBL:  Minimal  DRAINS: Urinary Catheter (20 Fr. Foley)   SPECIMEN:  Source of Specimen: 1. Bladder tumor (from right bladder neck) 2. Cold cup biopsy of the posterior wall of the bladder 3. 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 She underwent an induction course of full-strength BCG completed in 10/13. Surveillance cystoscopy revealed no evidence of recurrence until recently when I noted a recurrent tumor at the bladder neck. She is brought to the operating room for resection of her bladder tumor, bladder biopsies and postoperative mitomycin-C chemotherapy intravesically.  Description of operation: The patient was taken to the operating room and administered general anesthesia. He was then placed on the table and moved to the dorsal lithotomy position after which his genitalia was sterilely prepped and draped. An official timeout was then performed.  The 26 French resectoscope with Timberlake obturator was then introduced into the bladder and the obturator was removed. The resectoscope element with 12 lens was then inserted and the bladder was fully and systematically inspected. Ureteral orifices were noted to be in the normal anatomic positions. The right orifice was somewhat patulous. I noted a papillary tumor at the bladder neck on the right-hand side extending from the 11:00 to the 9:00 position. There was a second area that appeared to be a flat papillary growth on the bladder  floor posteriorly in the midline. No other worrisome lesions were identified within the bladder.  I first began by resecting the tumor at the bladder neck. This was fully resected and the surrounding mucosa fulgurated. I then attempted to resect the lesion on the floor of the bladder but it was essentially obliterated and therefore there was no specimen. The area was fully resected/fulgurated. I then switched the resectoscope element for the cold cup biopsy element and obtain cold cup biopsies as noted above. I then fulgurated the biopsy sites with the resectoscope loop. Reinspection of the bladder revealed all obvious tumor had been fully resected and there was no evidence of perforation. The Microvasive evacuator was then used to irrigate the bladder and remove all of the portions of bladder tumor which were sent to pathology. I then removed the resectoscope.  A 20 French Foley catheter was then inserted in the bladder and irrigated. The irrigant returned clear with no clots. The patient was awakened and taken to the recovery room.  While in the recovery room 40 mg of mitomycin-C in 40 cc of water were instilled in the bladder through the catheter and the catheter was plugged. This will remain indwelling for approximately one hour. It will then be drained from the bladder and the catheter will be removed and the patient discharged home.  PLAN OF CARE: Discharge to home after PACU  PATIENT DISPOSITION:  PACU - hemodynamically stable.

## 2013-01-09 ENCOUNTER — Encounter (HOSPITAL_BASED_OUTPATIENT_CLINIC_OR_DEPARTMENT_OTHER): Payer: Self-pay | Admitting: Urology

## 2013-03-16 ENCOUNTER — Other Ambulatory Visit: Payer: Self-pay | Admitting: Family Medicine

## 2013-03-16 ENCOUNTER — Telehealth: Payer: Self-pay | Admitting: Family Medicine

## 2013-03-22 ENCOUNTER — Ambulatory Visit (INDEPENDENT_AMBULATORY_CARE_PROVIDER_SITE_OTHER): Payer: Medicare Other | Admitting: Family Medicine

## 2013-03-22 ENCOUNTER — Encounter: Payer: Self-pay | Admitting: Family Medicine

## 2013-03-22 VITALS — BP 140/66 | HR 76 | Temp 97.6°F | Wt 173.6 lb

## 2013-03-22 DIAGNOSIS — I10 Essential (primary) hypertension: Secondary | ICD-10-CM

## 2013-03-22 DIAGNOSIS — I509 Heart failure, unspecified: Secondary | ICD-10-CM

## 2013-03-22 DIAGNOSIS — D649 Anemia, unspecified: Secondary | ICD-10-CM

## 2013-03-22 DIAGNOSIS — E559 Vitamin D deficiency, unspecified: Secondary | ICD-10-CM

## 2013-03-22 DIAGNOSIS — E7800 Pure hypercholesterolemia, unspecified: Secondary | ICD-10-CM

## 2013-03-22 DIAGNOSIS — J449 Chronic obstructive pulmonary disease, unspecified: Secondary | ICD-10-CM

## 2013-03-22 DIAGNOSIS — N3289 Other specified disorders of bladder: Secondary | ICD-10-CM

## 2013-03-22 DIAGNOSIS — E119 Type 2 diabetes mellitus without complications: Secondary | ICD-10-CM

## 2013-03-22 DIAGNOSIS — E0590 Thyrotoxicosis, unspecified without thyrotoxic crisis or storm: Secondary | ICD-10-CM

## 2013-03-22 LAB — COMPLETE METABOLIC PANEL WITH GFR
ALT: 10 U/L (ref 0–35)
AST: 14 U/L (ref 0–37)
Albumin: 3.9 g/dL (ref 3.5–5.2)
Alkaline Phosphatase: 65 U/L (ref 39–117)
BUN: 16 mg/dL (ref 6–23)
CO2: 31 mEq/L (ref 19–32)
Calcium: 9.7 mg/dL (ref 8.4–10.5)
Chloride: 106 mEq/L (ref 96–112)
Creat: 0.79 mg/dL (ref 0.50–1.10)
GFR, Est African American: 86 mL/min
GFR, Est Non African American: 74 mL/min
Glucose, Bld: 89 mg/dL (ref 70–99)
Potassium: 4.9 mEq/L (ref 3.5–5.3)
Sodium: 143 mEq/L (ref 135–145)
Total Bilirubin: 0.7 mg/dL (ref 0.3–1.2)
Total Protein: 8 g/dL (ref 6.0–8.3)

## 2013-03-22 LAB — POCT CBC
Granulocyte percent: 60.2 %G (ref 37–80)
HCT, POC: 42 % (ref 37.7–47.9)
Hemoglobin: 13.3 g/dL (ref 12.2–16.2)
Lymph, poc: 1.7 (ref 0.6–3.4)
MCH, POC: 25.4 pg — AB (ref 27–31.2)
MCHC: 31.5 g/dL — AB (ref 31.8–35.4)
MCV: 80.4 fL (ref 80–97)
MPV: 8.4 fL (ref 0–99.8)
POC Granulocyte: 2.8 (ref 2–6.9)
POC LYMPH PERCENT: 36.9 %L (ref 10–50)
Platelet Count, POC: 141 10*3/uL — AB (ref 142–424)
RBC: 5.2 M/uL (ref 4.04–5.48)
RDW, POC: 20.4 %
WBC: 4.7 10*3/uL (ref 4.6–10.2)

## 2013-03-22 LAB — POCT GLYCOSYLATED HEMOGLOBIN (HGB A1C): Hemoglobin A1C: 5.5

## 2013-03-22 LAB — BRAIN NATRIURETIC PEPTIDE: Brain Natriuretic Peptide: 112.8 pg/mL — ABNORMAL HIGH (ref 0.0–100.0)

## 2013-03-22 NOTE — Patient Instructions (Addendum)
      Dr Merrill Deanda's Recommendations  Diet and Exercise discussed with patient.  For nutrition information, I recommend books:  1).Eat to Live by Dr Joel Fuhrman. 2).Prevent and Reverse Heart Disease by Dr Caldwell Esselstyn. 3) Dr Neal Barnard's Book:  Program to Reverse Diabetes  Exercise recommendations are:  If unable to walk, then the patient can exercise in a chair 3 times a day. By flapping arms like a bird gently and raising legs outwards to the front.  If ambulatory, the patient can go for walks for 30 minutes 3 times a week. Then increase the intensity and duration as tolerated.  Goal is to try to attain exercise frequency to 5 times a week.  If applicable: Best to perform resistance exercises (machines or weights) 2 days a week and cardio type exercises 3 days per week.  

## 2013-03-22 NOTE — Progress Notes (Signed)
Patient ID: Sonya Oliver, female   DOB: 1941-01-14, 72 y.o.   MRN: [id number] SUBJECTIVE: CC: Chief Complaint  Patient presents with  . Follow-up    3 month follow up    HPI: Patient is here for follow up of Diabetes Mellitus/CHF/COPD/HTN/Transitional cell cancer of the bladder: Symptoms evaluated: Denies Nocturia ,Denies Urinary Frequency , denies Blurred vision, ,deniesDizziness,denies.Dysuria,denies paresthesias, denies extremity pain or ulcers.Marland Kitchendenies chest pain. has had an annual eye exam. do check the feet. Does check CBGs. Average CBG: doing better 135 Denies episodes of hypoglycemia. Does have an emergency hypoglycemic plan. admits toCompliance with medications. Denies Problems with medications.  Past Medical History  Diagnosis Date  . Hypertension   . Thyroid disease HYPERTHYROIDISM--  NO LONGER ON MED. PT STATES BLOOD WORK BACK TO NORMAL  . 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 Arthritis   . H/O pulmonary edema JAN 2014  . Diabetes mellitus type II, controlled   . Dyspnea on exertion   . Nocturnal oxygen desaturation USES O2 HS VIA [place]  . 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 CHF (congestive heart failure) ACUTE CHF W/ PULM, EDEMA --  JAN 2014    NO CARDIOLOGIST---  MONITORED BY PCP DR Modesto Charon  . Nocturia   . COPD with emphysema    Past Surgical History  Procedure Laterality Date  . Cystoscopy  03/23/2012    Procedure: CYSTOSCOPY;  Surgeon: Garnett Farm, MD;  Location: [facility];  Service: Urology;  Laterality: N/A;  . Transurethral resection of bladder tumor  03/23/2012    Procedure: TRANSURETHRAL RESECTION OF BLADDER TUMOR (TURBT);  Surgeon: Garnett Farm, MD;  Location: [facility];  Service: Urology;  Laterality: N/A;  . Transthoracic echocardiogram  09-13-2012    MILDLY DILATED LV  &  RV/ LVSF NORMAL/ EF 55-60%/ MILD MR/ MILDLY DILATED LA  &  RA/ SYSTOLIC PRESSURE MODERATELY INCREASED PA  . Abdominal hysterectomy  1980'S    W/ BILATERAL  SALPINGO-OOPHORECTOMY  . Transurethral resection of bladder tumor N/A 01/08/2013    Procedure: TRANSURETHRAL RESECTION OF BLADDER TUMOR (TURBT);  Surgeon: Garnett Farm, MD;  Location: [facility];  Service: Urology;  Laterality: N/A;   History   Social History  . Marital Status: Widowed    Spouse Name: N/A    Number of Children: N/A  . Years of Education: N/A   Occupational History  . Not on file.   Social History Main Topics  . Smoking status: Former Smoker -- 1.00 packs/day for 50 years    Types: Cigarettes    Quit date: 09/24/2006  . Smokeless tobacco: Never Used  . Alcohol Use: No  . Drug Use: No  . Sexually Active: Not on file   Other Topics Concern  . Not on file   Social History Narrative   Occupation: worked in Radio broadcast assistant [phone] yrs   Family History  Problem Relation Age of Onset  . Diabetes Mother   . Diabetes Father    Current Outpatient Prescriptions on File Prior to Visit  Medication Sig Dispense Refill  . carvedilol (COREG) 3.125 MG tablet TAKE 1 TABLET BY MOUTH TWICE DAILY  60 tablet  2  . Cholecalciferol (VITAMIN D3) 2000 UNITS TABS Take 1 tablet by mouth daily.      . furosemide (LASIX) 40 MG tablet Take 1 tablet (40 mg total) by mouth daily.  30 tablet  0  . KLOR-CON M20 20 MEQ  tablet Take 20 mEq by mouth daily.       . metFORMIN (GLUCOPHAGE) 500 MG tablet TAKE 1 TABLET BY MOUTH TWICE DAILY  60 tablet  2  . phenazopyridine (PYRIDIUM) 200 MG tablet Take 1 tablet (200 mg total) by mouth 3 (three) times daily as needed for pain.  30 tablet  0   No current facility-administered medications on file prior to visit.   No Known Allergies Immunization History  Administered Date(s) Administered  . Pneumococcal Polysaccharide 03/31/2011  . Tdap 03/31/2011   Prior to Admission medications   Medication Sig Start Date End Date Taking? Authorizing Provider  carvedilol (COREG) 3.125 MG tablet TAKE 1 TABLET BY MOUTH TWICE DAILY 03/16/13   Ileana Ladd, MD  Cholecalciferol (VITAMIN D3) 2000 UNITS TABS Take 1 tablet by mouth daily.    Historical Provider, MD  furosemide (LASIX) 40 MG tablet Take 1 tablet (40 mg total) by mouth daily. 09/16/12   Leroy Sea, MD  KLOR-CON M20 20 MEQ tablet Take 20 mEq by mouth daily.  12/12/12   Historical Provider, MD  metFORMIN (GLUCOPHAGE) 500 MG tablet TAKE 1 TABLET BY MOUTH TWICE DAILY 03/16/13   Ileana Ladd, MD  phenazopyridine (PYRIDIUM) 200 MG tablet Take 1 tablet (200 mg total) by mouth 3 (three) times daily as needed for pain. 01/08/13   Garnett Farm, MD    ROS: As above in the HPI. All other systems are stable or negative.  OBJECTIVE: APPEARANCE:  Patient in no acute distress.The patient appeared well nourished and normally developed. Acyanotic. Waist: VITAL SIGNS:BP 140/66  Pulse 76  Temp(Src) 97.6 F (36.4 C) (Oral)  Wt 173 lb 9.6 oz (78.744 kg)  BMI 28.03 kg/m2 AAF  SKIN: warm and  Dry without overt rashes, tattoos and scars  HEAD and Neck: without JVD, Head and scalp: normal Eyes:No scleral icterus. 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 Ears: Auricle normal, canal normal, Tympanic membranes normal, insufflation normal. Nose: normal Throat: normal Neck & thyroid: normal  CHEST & LUNGS: Chest wall: normal Lungs: Clear  CVS: Reveals the PMI to be normally located. Regular rhythm, First and Second Heart sounds are normal,  absence of murmurs, rubs or gallops. Peripheral vasculature: Radial pulses: normal Dorsal pedis pulses: normal Posterior pulses: normal  ABDOMEN:  Appearance: normal Benign, no organomegaly, no masses, no Abdominal Aortic enlargement. No Guarding , no rebound. No Bruits. Bowel sounds: normal  RECTAL: N/A GU: N/A  EXTREMETIES: 2+ edematous. Both Femoral and Pedal pulses are normal.  MUSCULOSKELETAL:  Spine:mild kyphosis Joints: knees crepitus swelling and arthritic. Ambulates with a  walker  NEUROLOGIC: oriented  to time,place and person;  ASSESSMENT: Unspecified vitamin D deficiency - Plan: Vitamin D 25 hydroxy  Hyperthyroidism - Plan: TSH  HTN (hypertension) - Plan: COMPLETE METABOLIC PANEL WITH GFR  Diabetes - Plan: POCT glycosylated hemoglobin (Hb A1C), COMPLETE METABOLIC PANEL WITH GFR, CANCELED: POCT UA - Microalbumin  COPD (chronic obstructive pulmonary disease) - Plan: NMR Lipoprofile with Lipids  Bladder mass  Anemia - Plan: POCT CBC  CHF (congestive heart failure), unspecified failure chronicity, unspecified type - Plan: Brain natriuretic peptide  Pure hypercholesterolemia - Plan: NMR Lipoprofile with Lipids  PLAN: Orders Placed This Encounter  Procedures  . COMPLETE METABOLIC PANEL WITH GFR  . NMR Lipoprofile with Lipids  . TSH  . Vitamin D 25 hydroxy  . Brain natriuretic peptide  . POCT CBC  . POCT glycosylated hemoglobin (Hb A1C)   Continue meds and  present regimen. Follow up with the specialists as she is doing.       Dr Woodroe Mode Recommendations  Diet and Exercise discussed with patient.  For nutrition information, I recommend books:  1).Eat to Live by Dr Monico Hoar. 2).Prevent and Reverse Heart Disease by Dr Suzzette Righter. 3) Dr Katherina Right Book:  Program to Reverse Diabetes  Exercise recommendations are:  If unable to walk, then the patient can exercise in a chair 3 times a day. By flapping arms like a bird gently and raising legs outwards to the front.  If ambulatory, the patient can go for walks for 30 minutes 3 times a week. Then increase the intensity and duration as tolerated.  Goal is to try to attain exercise frequency to 5 times a week.  If applicable: Best to perform resistance exercises (machines or weights) 2 days a week and cardio type exercises 3 days per week.  Return in about 3 months (around 06/22/2013) for Recheck medical problems.  Yoniel Arkwright P. Modesto Charon, M.D.

## 2013-03-23 LAB — NMR LIPOPROFILE WITH LIPIDS
Cholesterol, Total: 179 mg/dL (ref <200)
HDL Particle Number: 27.1 umol/L — ABNORMAL LOW (ref >=30.5)
HDL Size: 11 nm (ref >=9.2)
HDL-C: 70 mg/dL (ref >=40)
LDL (calc): 98 mg/dL (ref <100)
LDL Particle Number: 759 nmol/L (ref <1000)
LDL Size: 21 nm (ref >20.5)
LP-IR Score: <25 (ref <=45)
Large HDL-P: 14.4 umol/L (ref >=4.8)
Large VLDL-P: 1.5 nmol/L (ref <=2.7)
Small LDL Particle Number: <90 nmol/L (ref <=527)
Triglycerides: 54 mg/dL (ref <150)
VLDL Size: 48.6 nm — ABNORMAL HIGH (ref <=46.6)

## 2013-03-23 LAB — VITAMIN D 25 HYDROXY (VIT D DEFICIENCY, FRACTURES): Vit D, 25-Hydroxy: 42 ng/mL (ref 30–89)

## 2013-03-23 LAB — TSH: TSH: 0.058 u[IU]/mL — ABNORMAL LOW (ref 0.350–4.500)

## 2013-06-09 ENCOUNTER — Other Ambulatory Visit: Payer: Self-pay | Admitting: Family Medicine

## 2013-06-10 ENCOUNTER — Other Ambulatory Visit: Payer: Self-pay | Admitting: Family Medicine

## 2013-06-11 NOTE — Telephone Encounter (Signed)
Last seen and last glucose 03/22/13  [facility]

## 2013-06-12 NOTE — Telephone Encounter (Signed)
Prescription renewed in EPIC. 

## 2013-06-12 NOTE — Telephone Encounter (Signed)
[facility] notified of refil

## 2013-06-12 NOTE — Telephone Encounter (Signed)
Last seen 03/22/13  [facility] 

## 2013-06-14 ENCOUNTER — Other Ambulatory Visit: Payer: Self-pay | Admitting: Family Medicine

## 2013-06-14 NOTE — Telephone Encounter (Signed)
Patient needs to be seen. Has exceeded time since last visit. Limited quantity refilled. Needs to bring all medications to next appointment.   

## 2013-06-19 ENCOUNTER — Encounter (HOSPITAL_COMMUNITY): Payer: Self-pay | Admitting: Emergency Medicine

## 2013-06-19 ENCOUNTER — Emergency Department (HOSPITAL_COMMUNITY): Payer: Medicare Other

## 2013-06-19 ENCOUNTER — Inpatient Hospital Stay (HOSPITAL_COMMUNITY)
Admission: EM | Admit: 2013-06-19 | Discharge: 2013-06-23 | DRG: 291 | Disposition: A | Discharge status: Home Health | Payer: Medicare Other | Attending: Internal Medicine | Admitting: Internal Medicine

## 2013-06-19 DIAGNOSIS — D62 Acute posthemorrhagic anemia: Secondary | ICD-10-CM

## 2013-06-19 DIAGNOSIS — Z9981 Dependence on supplemental oxygen: Secondary | ICD-10-CM

## 2013-06-19 DIAGNOSIS — R310 Gross hematuria: Secondary | ICD-10-CM

## 2013-06-19 DIAGNOSIS — I951 Orthostatic hypotension: Secondary | ICD-10-CM

## 2013-06-19 DIAGNOSIS — Z87891 Personal history of nicotine dependence: Secondary | ICD-10-CM

## 2013-06-19 DIAGNOSIS — C679 Malignant neoplasm of bladder, unspecified: Secondary | ICD-10-CM | POA: Diagnosis present

## 2013-06-19 DIAGNOSIS — E0590 Thyrotoxicosis, unspecified without thyrotoxic crisis or storm: Secondary | ICD-10-CM | POA: Diagnosis present

## 2013-06-19 DIAGNOSIS — R609 Edema, unspecified: Secondary | ICD-10-CM | POA: Diagnosis present

## 2013-06-19 DIAGNOSIS — E559 Vitamin D deficiency, unspecified: Secondary | ICD-10-CM

## 2013-06-19 DIAGNOSIS — J9620 Acute and chronic respiratory failure, unspecified whether with hypoxia or hypercapnia: Secondary | ICD-10-CM | POA: Diagnosis present

## 2013-06-19 DIAGNOSIS — I509 Heart failure, unspecified: Secondary | ICD-10-CM | POA: Diagnosis present

## 2013-06-19 DIAGNOSIS — E119 Type 2 diabetes mellitus without complications: Secondary | ICD-10-CM | POA: Diagnosis present

## 2013-06-19 DIAGNOSIS — I10 Essential (primary) hypertension: Secondary | ICD-10-CM

## 2013-06-19 DIAGNOSIS — M79609 Pain in unspecified limb: Secondary | ICD-10-CM | POA: Diagnosis present

## 2013-06-19 DIAGNOSIS — J449 Chronic obstructive pulmonary disease, unspecified: Secondary | ICD-10-CM

## 2013-06-19 DIAGNOSIS — I5033 Acute on chronic diastolic (congestive) heart failure: Principal | ICD-10-CM | POA: Diagnosis present

## 2013-06-19 DIAGNOSIS — R0602 Shortness of breath: Secondary | ICD-10-CM | POA: Diagnosis present

## 2013-06-19 DIAGNOSIS — D649 Anemia, unspecified: Secondary | ICD-10-CM

## 2013-06-19 DIAGNOSIS — N3289 Other specified disorders of bladder: Secondary | ICD-10-CM

## 2013-06-19 DIAGNOSIS — R600 Localized edema: Secondary | ICD-10-CM

## 2013-06-19 DIAGNOSIS — R55 Syncope and collapse: Secondary | ICD-10-CM

## 2013-06-19 DIAGNOSIS — R5381 Other malaise: Secondary | ICD-10-CM | POA: Diagnosis present

## 2013-06-19 LAB — CBC
HCT: 40.8 % (ref 36.0–46.0)
Hemoglobin: 13.6 g/dL (ref 12.0–15.0)
MCHC: 33.3 g/dL (ref 30.0–36.0)
RBC: 4.78 MIL/uL (ref 3.87–5.11)

## 2013-06-19 LAB — TSH: TSH: 0.019 u[IU]/mL — ABNORMAL LOW (ref 0.350–4.500)

## 2013-06-19 LAB — CBC WITH DIFFERENTIAL/PLATELET
Basophils Absolute: 0 10*3/uL (ref 0.0–0.1)
Basophils Relative: 0 % (ref 0–1)
Eosinophils Relative: 1 % (ref 0–5)
Hemoglobin: 12.7 g/dL (ref 12.0–15.0)
Lymphocytes Relative: 11 % — ABNORMAL LOW (ref 12–46)
Lymphs Abs: 0.8 10*3/uL (ref 0.7–4.0)
MCHC: 32.5 g/dL (ref 30.0–36.0)
Neutro Abs: 5.6 10*3/uL (ref 1.7–7.7)
Neutrophils Relative %: 80 % — ABNORMAL HIGH (ref 43–77)
Platelets: 141 10*3/uL — ABNORMAL LOW (ref 150–400)
RBC: 4.58 MIL/uL (ref 3.87–5.11)
RDW: 15.3 % (ref 11.5–15.5)
WBC: 7 10*3/uL (ref 4.0–10.5)

## 2013-06-19 LAB — COMPREHENSIVE METABOLIC PANEL
ALT: 7 U/L (ref 0–35)
ALT: 6 U/L (ref 0–35)
Alkaline Phosphatase: 73 U/L (ref 39–117)
Alkaline Phosphatase: 61 U/L (ref 39–117)
BUN: 18 mg/dL (ref 6–23)
BUN: 16 mg/dL (ref 6–23)
CO2: 30 mEq/L (ref 19–32)
CO2: 32 mEq/L (ref 19–32)
Calcium: 9.8 mg/dL (ref 8.4–10.5)
GFR calc Af Amer: >90 mL/min (ref >90)
GFR calc Af Amer: >90 mL/min (ref >90)
GFR calc non Af Amer: 84 mL/min — ABNORMAL LOW (ref >90)
GFR calc non Af Amer: 82 mL/min — ABNORMAL LOW (ref >90)
Glucose, Bld: 116 mg/dL — ABNORMAL HIGH (ref 70–99)
Glucose, Bld: 185 mg/dL — ABNORMAL HIGH (ref 70–99)
Potassium: 3.8 mEq/L (ref 3.5–5.1)
Potassium: 3.9 mEq/L (ref 3.5–5.1)
Sodium: 132 mEq/L — ABNORMAL LOW (ref 135–145)
Sodium: 136 mEq/L (ref 135–145)
Total Bilirubin: 0.7 mg/dL (ref 0.3–1.2)
Total Protein: 8.9 g/dL — ABNORMAL HIGH (ref 6.0–8.3)
Total Protein: 7.9 g/dL (ref 6.0–8.3)

## 2013-06-19 LAB — POCT I-STAT, CHEM 8
Calcium, Ion: 1.17 mmol/L (ref 1.13–1.30)
Creatinine, Ser: 0.9 mg/dL (ref 0.50–1.10)
Glucose, Bld: 115 mg/dL — ABNORMAL HIGH (ref 70–99)
HCT: 46 % (ref 36.0–46.0)
Hemoglobin: 15.6 g/dL — ABNORMAL HIGH (ref 12.0–15.0)
Potassium: 3.9 mEq/L (ref 3.5–5.1)
TCO2: 29 mmol/L (ref 0–100)

## 2013-06-19 LAB — GLUCOSE, CAPILLARY: Glucose-Capillary: 123 mg/dL — ABNORMAL HIGH (ref 70–99)

## 2013-06-19 LAB — TROPONIN I: Troponin I: <0.3 ng/mL (ref <0.30)

## 2013-06-19 LAB — T4, FREE: Free T4: 1.27 ng/dL (ref 0.80–1.80)

## 2013-06-19 LAB — PRO B NATRIURETIC PEPTIDE: Pro B Natriuretic peptide (BNP): 198.2 pg/mL — ABNORMAL HIGH (ref 0–125)

## 2013-06-19 LAB — T3, FREE: T3, Free: 2.4 pg/mL (ref 2.3–4.2)

## 2013-06-19 MED ORDER — IPRATROPIUM BROMIDE 0.02 % IN SOLN
0.5000 mg | Freq: Once | RESPIRATORY_TRACT | Status: AC
  Administered 2013-06-19: 0.5 mg via RESPIRATORY_TRACT
  Filled 2013-06-19: qty 2.5

## 2013-06-19 MED ORDER — CARVEDILOL 3.125 MG PO TABS
3.1250 mg | ORAL_TABLET | Freq: Two times a day (BID) | ORAL | Status: DC
  Administered 2013-06-19 – 2013-06-22 (×7): 3.125 mg via ORAL
  Filled 2013-06-19 (×10): qty 1

## 2013-06-19 MED ORDER — ONDANSETRON HCL 4 MG PO TABS: 4.0000 mg | ORAL_TABLET | Freq: Four times a day (QID) | ORAL | Status: DC | PRN

## 2013-06-19 MED ORDER — SODIUM CHLORIDE 0.9 % IJ SOLN
3.0000 mL | Freq: Two times a day (BID) | INTRAMUSCULAR | Status: DC
  Administered 2013-06-19 – 2013-06-23 (×7): 3 mL via INTRAVENOUS

## 2013-06-19 MED ORDER — SODIUM CHLORIDE 0.9 % IJ SOLN: 3.0000 mL | Freq: Two times a day (BID) | INTRAMUSCULAR | Status: DC

## 2013-06-19 MED ORDER — ACETAMINOPHEN 650 MG RE SUPP: 650.0000 mg | Freq: Four times a day (QID) | RECTAL | Status: DC | PRN

## 2013-06-19 MED ORDER — FUROSEMIDE 10 MG/ML IJ SOLN
40.0000 mg | Freq: Two times a day (BID) | INTRAMUSCULAR | Status: DC
  Administered 2013-06-19 – 2013-06-22 (×6): 40 mg via INTRAVENOUS
  Filled 2013-06-19 (×9): qty 4

## 2013-06-19 MED ORDER — NITROGLYCERIN IN D5W 200-5 MCG/ML-% IV SOLN
3.0000 ug/min | Freq: Once | INTRAVENOUS | Status: AC
  Administered 2013-06-19: 3 ug/min via INTRAVENOUS
  Filled 2013-06-19: qty 250

## 2013-06-19 MED ORDER — ALBUTEROL SULFATE (5 MG/ML) 0.5% IN NEBU
INHALATION_SOLUTION | RESPIRATORY_TRACT | Status: AC
  Filled 2013-06-19: qty 1

## 2013-06-19 MED ORDER — LEVOFLOXACIN IN D5W 750 MG/150ML IV SOLN
750.0000 mg | Freq: Every day | INTRAVENOUS | Status: DC
  Administered 2013-06-19 – 2013-06-20 (×2): 750 mg via INTRAVENOUS
  Filled 2013-06-19 (×3): qty 150

## 2013-06-19 MED ORDER — INFLUENZA VAC SPLIT QUAD 0.5 ML IM SUSP
0.5000 mL | INTRAMUSCULAR | Status: AC
  Administered 2013-06-20: 11:00:00 0.5 mL via INTRAMUSCULAR
  Filled 2013-06-19 (×2): qty 0.5

## 2013-06-19 MED ORDER — ALBUTEROL SULFATE (5 MG/ML) 0.5% IN NEBU
2.5000 mg | INHALATION_SOLUTION | RESPIRATORY_TRACT | Status: DC | PRN
  Administered 2013-06-19 – 2013-06-20 (×2): 2.5 mg via RESPIRATORY_TRACT
  Filled 2013-06-19 (×3): qty 0.5

## 2013-06-19 MED ORDER — POTASSIUM CHLORIDE CRYS ER 20 MEQ PO TBCR
20.0000 meq | EXTENDED_RELEASE_TABLET | Freq: Every day | ORAL | Status: DC
  Administered 2013-06-19 – 2013-06-23 (×5): 20 meq via ORAL
  Filled 2013-06-19 (×5): qty 1

## 2013-06-19 MED ORDER — FUROSEMIDE 10 MG/ML IJ SOLN
40.0000 mg | Freq: Once | INTRAMUSCULAR | Status: AC
  Administered 2013-06-19: 40 mg via INTRAVENOUS
  Filled 2013-06-19: qty 4

## 2013-06-19 MED ORDER — IPRATROPIUM BROMIDE 0.02 % IN SOLN
RESPIRATORY_TRACT | Status: AC
  Filled 2013-06-19: qty 2.5

## 2013-06-19 MED ORDER — ACETAMINOPHEN 325 MG PO TABS: 650.0000 mg | ORAL_TABLET | Freq: Four times a day (QID) | ORAL | Status: DC | PRN

## 2013-06-19 MED ORDER — ONDANSETRON HCL 4 MG/2ML IJ SOLN: 4.0000 mg | Freq: Four times a day (QID) | INTRAMUSCULAR | Status: DC | PRN

## 2013-06-19 MED ORDER — ALBUTEROL SULFATE (5 MG/ML) 0.5% IN NEBU
2.5000 mg | INHALATION_SOLUTION | Freq: Once | RESPIRATORY_TRACT | Status: AC
  Administered 2013-06-19: 2.5 mg via RESPIRATORY_TRACT
  Filled 2013-06-19: qty 0.5

## 2013-06-19 MED ORDER — ENOXAPARIN SODIUM 40 MG/0.4ML ~~LOC~~ SOLN
40.0000 mg | SUBCUTANEOUS | Status: DC
Start: 2013-06-19 — End: 2013-06-23
  Administered 2013-06-19 – 2013-06-22 (×4): 40 mg via SUBCUTANEOUS
  Filled 2013-06-19 (×5): qty 0.4

## 2013-06-19 MED ORDER — INSULIN ASPART 100 UNIT/ML ~~LOC~~ SOLN
0.0000 [IU] | Freq: Three times a day (TID) | SUBCUTANEOUS | Status: DC
  Administered 2013-06-19 – 2013-06-21 (×2): 1 [IU] via SUBCUTANEOUS

## 2013-06-19 NOTE — Progress Notes (Signed)
ANTIBIOTIC CONSULT NOTE - INITIAL  Pharmacy Consult for Levofloxacin Indication: pneumonia  No Known Allergies  Patient Measurements: Height: 5\' 6"  (167.6 cm) Weight: 170 lb (77.111 kg) IBW/kg (Calculated) : 59.3 Adjusted Body Weight:   Vital Signs: Temp: 99.3 F (37.4 C) (10/21 0225) Temp src: Oral (10/21 0225) BP: 122/60 mmHg (10/21 0416) Pulse Rate: 83 (10/21 0416) Intake/Output from previous day:   Intake/Output from this shift:    Labs:  Recent Labs  06/19/13 0300 06/19/13 0318  WBC 8.4  --   HGB 13.6 15.6*  PLT 136*  --   CREATININE 0.70 0.90   Estimated Creatinine Clearance: 58.4 ml/min (by C-G formula based on Cr of 0.9). No results found for this basename: VANCOTROUGH, VANCOPEAK, VANCORANDOM, GENTTROUGH, GENTPEAK, GENTRANDOM, TOBRATROUGH, TOBRAPEAK, TOBRARND, AMIKACINPEAK, AMIKACINTROU, AMIKACIN,  in the last 72 hours   Microbiology: No results found for this or any previous visit (from the past 720 hour(s)).  Medical History: Past Medical History  Diagnosis Date  . Hypertension   . Thyroid disease HYPERTHYROIDISM--  NO LONGER ON MED. PT STATES BLOOD WORK BACK TO NORMAL  . 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 Arthritis   . H/O pulmonary edema JAN 2014  . Diabetes mellitus type II, controlled   . Dyspnea on exertion   . Nocturnal oxygen desaturation USES O2 HS VIA [place]  . 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 CHF (congestive heart failure) ACUTE CHF W/ PULM, EDEMA --  JAN 2014    NO CARDIOLOGIST---  MONITORED BY PCP DR Modesto Charon  . Nocturia   . COPD with emphysema     Medications:  Anti-infectives   Start     Dose/Rate Route Frequency Ordered Stop   06/19/13 0615  levofloxacin (LEVAQUIN) IVPB 750 mg     750 mg 100 mL/hr over 90 Minutes Intravenous Daily before breakfast 06/19/13 0601       Assessment: Patient with PNA.  Goal of Therapy:  Levofloxacin dosed based on patient weight and renal function     Plan:  Follow up  culture results Levofloxacin 750mg  iv q24hr  [name] 06/19/2013,6:03 AM

## 2013-06-19 NOTE — ED Notes (Signed)
Hospitalist at bedside 

## 2013-06-19 NOTE — ED Notes (Signed)
Report given to floor, will transfer pt after breathing treatment.

## 2013-06-19 NOTE — ED Notes (Signed)
Pt is c/o  getting short of breath, respiratory therapy paged.

## 2013-06-19 NOTE — ED Provider Notes (Signed)
CSN: [id number]     Arrival date & time 06/19/13  0219 History   First MD Initiated Contact with Patient 06/19/13 [phone]     Chief Complaint  Patient presents with  . Respiratory Distress   (Consider location/radiation/quality/duration/timing/severity/associated sxs/prior Treatment) Patient is a 72 y.o. female presenting with shortness of breath. The history is provided by the patient and a relative. No language interpreter was used.  Shortness of Breath Severity:  Severe Onset quality:  Gradual Progression:  Worsening Associated symptoms: no chest pain, no fever and no vomiting   Associated symptoms comment:  She called family today complaining of shortness of breath. Per family, when they arrived at her house she appeared very uncomfortable, unable to speak full sentences and with any reclining. She denies chest pain. No vomiting. No recent fever or illness. No recent changes to her usual medications.    Past Medical History  Diagnosis Date  . Hypertension   . Thyroid disease HYPERTHYROIDISM--  NO LONGER ON MED. PT STATES BLOOD WORK BACK TO NORMAL  . 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 Arthritis   . H/O pulmonary edema JAN 2014  . Diabetes mellitus type II, controlled   . Dyspnea on exertion   . Nocturnal oxygen desaturation USES O2 HS VIA [place]  . 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 CHF (congestive heart failure) ACUTE CHF W/ PULM, EDEMA --  JAN 2014    NO CARDIOLOGIST---  MONITORED BY PCP DR Modesto Charon  . Nocturia   . COPD with emphysema    Past Surgical History  Procedure Laterality Date  . Cystoscopy  03/23/2012    Procedure: CYSTOSCOPY;  Surgeon: Garnett Farm, MD;  Location: [facility];  Service: Urology;  Laterality: N/A;  . Transurethral resection of bladder tumor  03/23/2012    Procedure: TRANSURETHRAL RESECTION OF BLADDER TUMOR (TURBT);  Surgeon: Garnett Farm, MD;  Location: [facility];  Service: Urology;  Laterality: N/A;  . Transthoracic echocardiogram   09-13-2012    MILDLY DILATED LV  &  RV/ LVSF NORMAL/ EF 55-60%/ MILD MR/ MILDLY DILATED LA  &  RA/ SYSTOLIC PRESSURE MODERATELY INCREASED PA  . Abdominal hysterectomy  1980'S    W/ BILATERAL SALPINGO-OOPHORECTOMY  . Transurethral resection of bladder tumor N/A 01/08/2013    Procedure: TRANSURETHRAL RESECTION OF BLADDER TUMOR (TURBT);  Surgeon: Garnett Farm, MD;  Location: [facility];  Service: Urology;  Laterality: N/A;   Family History  Problem Relation Age of Onset  . Diabetes Mother   . Diabetes Father    History  Substance Use Topics  . Smoking status: Former Smoker -- 1.00 packs/day for 50 years    Types: Cigarettes    Quit date: 09/24/2006  . Smokeless tobacco: Never Used  . Alcohol Use: No   OB History   Grav Para Term Preterm Abortions TAB SAB Ect Mult Living                 Review of Systems  Constitutional: Negative for fever and chills.  HENT: Negative.  Negative for congestion.   Respiratory: Positive for shortness of breath.   Cardiovascular: Positive for leg swelling. Negative for chest pain.  Gastrointestinal: Negative.  Negative for nausea and vomiting.  Genitourinary: Negative.   Musculoskeletal: Negative.   Skin: Negative.   Neurological: Negative.   Psychiatric/Behavioral: Negative for confusion.    Allergies  Review of patient's allergies indicates no known allergies.  Home Medications   Current Outpatient Rx  Name  Route  Sig  Dispense  Refill  . carvedilol (COREG) 3.125 MG tablet      TAKE 1 TABLET BY MOUTH TWICE DAILY   60 tablet   1     Due for follow up   . Cholecalciferol (VITAMIN D3) 2000 UNITS TABS   Oral   Take 1 tablet by mouth daily.         . furosemide (LASIX) 40 MG tablet      TAKE 1 TABLET BY MOUTH ONCE DAILY   30 tablet   2   . KLOR-CON M20 20 MEQ tablet   Oral   Take 20 mEq by mouth daily.          . metFORMIN (GLUCOPHAGE) 500 MG tablet      TAKE 1 TABLET BY MOUTH TWICE DAILY   60 tablet    0   . nitrofurantoin, macrocrystal-monohydrate, (MACROBID) 100 MG capsule   Oral   Take 100 mg by mouth 2 (two) times daily.          BP 122/60  Pulse 83  Temp(Src) 99.3 F (37.4 C) (Oral)  Resp 23  Ht 5\' 6"  (1.676 m)  Wt 170 lb (77.111 kg)  BMI 27.45 kg/m2  SpO2 96% Physical Exam  Constitutional: She is oriented to person, place, and time. She appears well-developed and well-nourished.  Uncomfortable appearing, tripod position  HENT:  Head: Normocephalic.  Neck: Normal range of motion. Neck supple.  Cardiovascular: Normal rate and regular rhythm.   Pulmonary/Chest: She is in respiratory distress. She has wheezes. She exhibits no tenderness.  Abdominal: Soft. Bowel sounds are normal. There is no tenderness. There is no rebound and no guarding.  Musculoskeletal: Normal range of motion. She exhibits edema.  Neurological: She is alert and oriented to person, place, and time.  Skin: Skin is warm and dry. No rash noted.  Psychiatric: She has a normal mood and affect.    ED Course  Procedures (including critical care time) Labs Review Labs Reviewed  CBC - Abnormal; Notable for the following:    Platelets 136 (*)    All other components within normal limits  COMPREHENSIVE METABOLIC PANEL - Abnormal; Notable for the following:    Sodium 132 (*)    Chloride 94 (*)    Glucose, Bld 116 (*)    Total Protein 8.9 (*)    GFR calc non Af Amer 84 (*)    All other components within normal limits  PRO B NATRIURETIC PEPTIDE - Abnormal; Notable for the following:    Pro B Natriuretic peptide (BNP) 198.2 (*)    All other components within normal limits  POCT I-STAT, CHEM 8 - Abnormal; Notable for the following:    Glucose, Bld 115 (*)    Hemoglobin 15.6 (*)    All other components within normal limits  TROPONIN I   Results for orders placed during the hospital encounter of 06/19/13  CBC      Result Value Range   WBC 8.4  4.0 - 10.5 K/uL   RBC 4.78  3.87 - 5.11 MIL/uL    Hemoglobin 13.6  12.0 - 15.0 g/dL   HCT 16.1  09.6 - 04.5 %   MCV 85.4  78.0 - 100.0 fL   MCH 28.5  26.0 - 34.0 pg   MCHC 33.3  30.0 - 36.0 g/dL   RDW 40.9  81.1 - 91.4 %   Platelets 136 (*) 150 -  400 K/uL  COMPREHENSIVE METABOLIC PANEL      Result Value Range   Sodium 132 (*) 135 - 145 mEq/L   Potassium 3.8  3.5 - 5.1 mEq/L   Chloride 94 (*) 96 - 112 mEq/L   CO2 30  19 - 32 mEq/L   Glucose, Bld 116 (*) 70 - 99 mg/dL   BUN 18  6 - 23 mg/dL   Creatinine, Ser 9.14  0.50 - 1.10 mg/dL   Calcium 9.8  8.4 - 78.2 mg/dL   Total Protein 8.9 (*) 6.0 - 8.3 g/dL   Albumin 3.7  3.5 - 5.2 g/dL   AST 15  0 - 37 U/L   ALT 7  0 - 35 U/L   Alkaline Phosphatase 73  39 - 117 U/L   Total Bilirubin 0.7  0.3 - 1.2 mg/dL   GFR calc non Af Amer 84 (*) >90 mL/min   GFR calc Af Amer >90  >90 mL/min  PRO B NATRIURETIC PEPTIDE      Result Value Range   Pro B Natriuretic peptide (BNP) 198.2 (*) 0 - 125 pg/mL  TROPONIN I      Result Value Range   Troponin I <0.30  <0.30 ng/mL  POCT I-STAT, CHEM 8      Result Value Range   Sodium 140  135 - 145 mEq/L   Potassium 3.9  3.5 - 5.1 mEq/L   Chloride 99  96 - 112 mEq/L   BUN 19  6 - 23 mg/dL   Creatinine, Ser 9.56  0.50 - 1.10 mg/dL   Glucose, Bld 213 (*) 70 - 99 mg/dL   Calcium, Ion 0.86  5.78 - 1.30 mmol/L   TCO2 29  0 - 100 mmol/L   Hemoglobin 15.6 (*) 12.0 - 15.0 g/dL   HCT 46.9  62.9 - 52.8 %    Imaging Review Dg Chest Portable 1 View  06/19/2013   CLINICAL DATA:  Respiratory distress.  EXAM: PORTABLE CHEST - 1 VIEW  COMPARISON:  01/08/2013  FINDINGS: Cardiac enlargement without vascular congestion. Diffuse interstitial pattern to the lungs suggesting interstitial edema or pneumonitis. This appearance is more prominent than on the previous study suggesting acute process. Fibrosis or atelectasis in the lung bases. Tortuous aorta. No blunting of costophrenic angles. No pneumothorax. Mediastinal contours appear intact.  IMPRESSION: Mild cardiac  enlargement without vascular congestion. Suggestion of interstitial infiltrates in the lungs possibly representing pneumonitis or edema.   Electronically Signed   By: Burman Nieves M.D.   On: 06/19/2013 03:23    EKG Interpretation     Ventricular Rate:  81 PR Interval:  195 QRS Duration: 101 QT Interval:  386 QTC Calculation: 448 R Axis:   76 Text Interpretation:  Sinus rhythm Premature atrial complexes Nonspecific ST abnormality Abnormal ECG            MDM   1. CHF (congestive heart failure)    She initially has difficulty with speaking in full sentences, on O2 by mask, minimal relief with albuterol. IV Lasix given, NTG started with marked improvement. She is resting comfortably at this time with O2 via [place]. CXR PNA vs edema, favor edema with peripheral edema on exam, h/o CHF and improvement with NTG and lasix. Admit to [facility] for CHF exacerbation.     Arnoldo Hooker, PA-C 06/19/13 0507

## 2013-06-19 NOTE — ED Provider Notes (Signed)
Medical screening examination/treatment/procedure(s) were conducted as a shared visit with non-physician practitioner(s) and myself.  I personally evaluated the patient during the encounter  Shortness of breath with lower extremities swelling, acute onset tonight at home. Her exam is tachypneic with decreased bilateral breath sounds, posterior rales, 2+ symmetric lower extremity edema. Chest x-ray shows pulmonary edema. Labs reviewed as below. Improved with Lasix and nitroglycerin drip.  MED admit  Results for orders placed during the hospital encounter of 06/19/13  CBC      Result Value Range   WBC 8.4  4.0 - 10.5 K/uL   RBC 4.78  3.87 - 5.11 MIL/uL   Hemoglobin 13.6  12.0 - 15.0 g/dL   HCT 40.9  81.1 - 91.4 %   MCV 85.4  78.0 - 100.0 fL   MCH 28.5  26.0 - 34.0 pg   MCHC 33.3  30.0 - 36.0 g/dL   RDW 78.2  95.6 - 21.3 %   Platelets 136 (*) 150 - 400 K/uL  COMPREHENSIVE METABOLIC PANEL      Result Value Range   Sodium 132 (*) 135 - 145 mEq/L   Potassium 3.8  3.5 - 5.1 mEq/L   Chloride 94 (*) 96 - 112 mEq/L   CO2 30  19 - 32 mEq/L   Glucose, Bld 116 (*) 70 - 99 mg/dL   BUN 18  6 - 23 mg/dL   Creatinine, Ser 0.86  0.50 - 1.10 mg/dL   Calcium 9.8  8.4 - 57.8 mg/dL   Total Protein 8.9 (*) 6.0 - 8.3 g/dL   Albumin 3.7  3.5 - 5.2 g/dL   AST 15  0 - 37 U/L   ALT 7  0 - 35 U/L   Alkaline Phosphatase 73  39 - 117 U/L   Total Bilirubin 0.7  0.3 - 1.2 mg/dL   GFR calc non Af Amer 84 (*) >90 mL/min   GFR calc Af Amer >90  >90 mL/min  PRO B NATRIURETIC PEPTIDE      Result Value Range   Pro B Natriuretic peptide (BNP) 198.2 (*) 0 - 125 pg/mL  TROPONIN I      Result Value Range   Troponin I <0.30  <0.30 ng/mL  POCT I-STAT, CHEM 8      Result Value Range   Sodium 140  135 - 145 mEq/L   Potassium 3.9  3.5 - 5.1 mEq/L   Chloride 99  96 - 112 mEq/L   BUN 19  6 - 23 mg/dL   Creatinine, Ser 4.69  0.50 - 1.10 mg/dL   Glucose, Bld 629 (*) 70 - 99 mg/dL   Calcium, Ion 5.28  4.13 - 1.30 mmol/L    TCO2 29  0 - 100 mmol/L   Hemoglobin 15.6 (*) 12.0 - 15.0 g/dL   HCT 24.4  01.0 - 27.2 %   Dg Chest Portable 1 View  06/19/2013   CLINICAL DATA:  Respiratory distress.  EXAM: PORTABLE CHEST - 1 VIEW  COMPARISON:  01/08/2013  FINDINGS: Cardiac enlargement without vascular congestion. Diffuse interstitial pattern to the lungs suggesting interstitial edema or pneumonitis. This appearance is more prominent than on the previous study suggesting acute process. Fibrosis or atelectasis in the lung bases. Tortuous aorta. No blunting of costophrenic angles. No pneumothorax. Mediastinal contours appear intact.  IMPRESSION: Mild cardiac enlargement without vascular congestion. Suggestion of interstitial infiltrates in the lungs possibly representing pneumonitis or edema.   Electronically Signed   By: Burman Nieves M.D.   On:  06/19/2013 03:23      Sunnie Nielsen, MD 06/19/13 0730

## 2013-06-19 NOTE — Care Management Note (Addendum)
    Page 1 of 2   06/22/2013     2:02:08 PM   CARE MANAGEMENT NOTE 06/22/2013  Patient:  Sonya Oliver, Sonya Oliver   Account Number:  [id number]  Date Initiated:  06/19/2013  Documentation initiated by:  Lanier Clam  Subjective/Objective Assessment:   72 Y/O F ADMITTED W/SOB.HX:CHF.     Action/Plan:   FROM HOME.HAS PCP,PHARMACY.   Anticipated DC Date:  06/23/2013   Anticipated DC Plan:  HOME W HOME HEALTH SERVICES      DC Planning Services  CM consult      Choice offered to / List presented to:  C-1 Patient        HH arranged  HH-1 RN  HH-2 PT  HH-3 OT      [facility] agency  [facility]   Status of service:  In process, will continue to follow Medicare Important Message given?   (If response is "NO", the following Medicare IM given date fields will be blank) Date Medicare IM given:   Date Additional Medicare IM given:    Discharge Disposition:    Per UR Regulation:  Reviewed for med. necessity/level of care/duration of stay  If discussed at Long Length of Stay Meetings, dates discussed:    Comments:  06/22/13 Sonya Caissie RN,BSN NCM [phone] NOTED 02 DESAT.[facility] ALREADY FOLLOWING,HHRN/PT/OT ORDERS PLACED,HOME  TELE MONITORING ALSO BY [facility]-KRISTEN REP AWARE & FOLLOWING.HOME 02 THROUGH [facility] ALREADY,HAS TRAVEL TANK.[facility] ALREADY FOLLOWING.  06/21/13 Sonya Lague RN,BSN NCM [phone] [facility] ORDERS PLACED-HHRN-CHF,TELE MONITORING, HHPT/OT.[facility] KRISTEN AWARE OF ORDERS & FOLLOWING.HOME 02 ACTIVE W/[facility] FOLLOWING FOR CHRONIC DISEASE MGMNT.  06/20/13 Sonya Graeff RN,BSN NCM [phone] [facility] CHOSEN FOR HHRN-CHF PROTOCAL,& TELE MONITORING-KRISTEN AWARE OF REFERRAL.HOME 02 ACTIVE [facility].ACTIVE W/[facility] FOLLOWING.  06/19/13 Sonya Woodham RN,BSN NCM [phone] WOULD RECOMMEND HHRN-CHF PROTOCAL.

## 2013-06-19 NOTE — ED Notes (Signed)
Per EMS: Pt is having breathing diff. Pt found tripod at home unable to complete a full sentences. Faint exp wheezes. O2 sats wdl. Dual neb given with some relief. 125mg  solumedrol IV. Non rebreather mask in place 98%. Less wheezing after tx. BP 195/71. Afib on 12 lead at 100.

## 2013-06-19 NOTE — Progress Notes (Signed)
Same day visit.  H and P from this AM reviewed. Agree with assessment and current plan.  Briefly, pt with a hx of known heart failure presents with SOB with LE edema. CXR suggestive of CHF vs PNA. Pt was started on diuretics and empiric antibiotic for PNA.[name] When seen this afternoon, pt was s/p breathing treatment and diuretics. LE edema much improved and pt is feeling much better.  For now, will cont as planned and cont scheduled diuretics with empiric Levaquin. Cont SSI for hx of DM Cont neb tx for hx of COPD

## 2013-06-19 NOTE — ED Notes (Signed)
Patient cautioned against primary side effect is headache, patient advised to notify nursing staff if she begins to suffer from headache. Patient confirms understanding.

## 2013-06-19 NOTE — H&P (Signed)
[facility] History and Physical  MONIQUE HEFTY BJY:[id number] DOB: 1940-10-28 DOA: 06/19/2013  Referring physician: ER physician. PCP: [name], MD   Chief Complaint: Shortness of breath.  HPI: Sonya Oliver is a 72 y.o. female with known history of CHF last EF measured was in January 2014 was 55%, diabetes mellitus type 2, hypothyroidism, COPD, bladder cancer presented to the ER because of increasing shortness of breath. Patient states that her shortness of breath increased from yesterday with increasing lower extremity edema. Denies any chest pain or any fever chills. In the ER chest x-ray was showing features concerning for CHF and possible pneumonia. Patient has been admitted for further management. Patient was started on IV Lasix 40 mg and also has been started on Levaquin for possible pneumonia.  Review of Systems: As presented in the history of presenting illness, rest negative.  Past Medical History  Diagnosis Date  . Hypertension   . Thyroid disease HYPERTHYROIDISM--  NO LONGER ON MED. PT STATES BLOOD WORK BACK TO NORMAL  . 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 Arthritis   . H/O pulmonary edema JAN 2014  . Diabetes mellitus type II, controlled   . Dyspnea on exertion   . Nocturnal oxygen desaturation USES O2 HS VIA [place]  . 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 CHF (congestive heart failure) ACUTE CHF W/ PULM, EDEMA --  JAN 2014    NO CARDIOLOGIST---  MONITORED BY PCP DR Modesto Charon  . Nocturia   . COPD with emphysema    Past Surgical History  Procedure Laterality Date  . Cystoscopy  03/23/2012    Procedure: CYSTOSCOPY;  Surgeon: Garnett Farm, MD;  Location: [facility];  Service: Urology;  Laterality: N/A;  . Transurethral resection of bladder tumor  03/23/2012    Procedure: TRANSURETHRAL RESECTION OF BLADDER TUMOR (TURBT);  Surgeon: Garnett Farm, MD;  Location: [facility];  Service: Urology;  Laterality: N/A;  . Transthoracic  echocardiogram  09-13-2012    MILDLY DILATED LV  &  RV/ LVSF NORMAL/ EF 55-60%/ MILD MR/ MILDLY DILATED LA  &  RA/ SYSTOLIC PRESSURE MODERATELY INCREASED PA  . Abdominal hysterectomy  1980'S    W/ BILATERAL SALPINGO-OOPHORECTOMY  . Transurethral resection of bladder tumor N/A 01/08/2013    Procedure: TRANSURETHRAL RESECTION OF BLADDER TUMOR (TURBT);  Surgeon: Garnett Farm, MD;  [facility];  Service: Urology;  Laterality: N/A;   Social History:  reports that she quit smoking about 6 years ago. Her smoking use included Cigarettes. She has a 50 pack-year smoking history. She has never used smokeless tobacco. She reports that she does not drink alcohol or use illicit drugs. Where does patient live home. Can patient participate in ADLs? Yes.  No Known Allergies  Family History:  Family History  Problem Relation Age of Onset  . Diabetes Mother   . Diabetes Father       Prior to Admission medications   Medication Sig Start Date End Date Taking? Authorizing Provider  carvedilol (COREG) 3.125 MG tablet TAKE 1 TABLET BY MOUTH TWICE DAILY 06/10/13  Yes Ileana Ladd, MD  Cholecalciferol (VITAMIN D3) 2000 UNITS TABS Take 1 tablet by mouth daily.   Yes Historical Provider, MD  furosemide (LASIX) 40 MG tablet TAKE 1 TABLET BY MOUTH ONCE DAILY 06/14/13  Yes Ileana Ladd, MD  KLOR-CON M20 20 MEQ tablet Take 20 mEq by mouth daily.  12/12/12  Yes Historical Provider, MD  metFORMIN (GLUCOPHAGE) 500 MG tablet TAKE 1 TABLET BY MOUTH TWICE DAILY 06/09/13  Yes Ileana Ladd, MD  nitrofurantoin, macrocrystal-monohydrate, (MACROBID) 100 MG capsule Take 100 mg by mouth 2 (two) times daily.   Yes Historical Provider, MD    Physical Exam: Filed Vitals:   06/19/13 0225 06/19/13 0300 06/19/13 0330 06/19/13 0416  BP: 171/60 154/57 144/63 122/60  Pulse: 82 86 88 83  Temp: 99.3 F (37.4 C)     TempSrc: Oral     Resp: 34 19 22 23   Height: 5\' 6"  (1.676 m)     Weight: 77.111 kg (170  lb)     SpO2: 99% 100% 100% 96%     General:  Well-developed and nourished.  Eyes:  Anicteric no pallor.  ENT: No discharge from ears eyes nose mouth.  Neck: No mass felt.  Cardiovascular: S1-S2 heard.  Respiratory: No rhonchi or crepitations.  Abdomen: Soft nontender bowel sounds present.  Skin: No rash.  Musculoskeletal: Bilateral lower extremity edema.  Psychiatric: Appears normal.  Neurologic: Patient is taking but easily arousable. Follows commands. Moves all extremities.  Labs on Admission:  Basic Metabolic Panel:  Recent Labs Lab 06/19/13 0300 06/19/13 0318  NA 132* 140  K 3.8 3.9  CL 94* 99  CO2 30  --   GLUCOSE 116* 115*  BUN 18 19  CREATININE 0.70 0.90  CALCIUM 9.8  --    Liver Function Tests:  Recent Labs Lab 06/19/13 0300  AST 15  ALT 7  ALKPHOS 73  BILITOT 0.7  PROT 8.9*  ALBUMIN 3.7   No results found for this basename: LIPASE, AMYLASE,  in the last 168 hours No results found for this basename: AMMONIA,  in the last 168 hours CBC:  Recent Labs Lab 06/19/13 0300 06/19/13 0318  WBC 8.4  --   HGB 13.6 15.6*  HCT 40.8 46.0  MCV 85.4  --   PLT 136*  --    Cardiac Enzymes:  Recent Labs Lab 06/19/13 0300  TROPONINI <0.30    BNP (last 3 results)  Recent Labs  09/12/12 1423 06/19/13 0300  PROBNP 653.9* 198.2*   CBG: No results found for this basename: GLUCAP,  in the last 168 hours  Radiological Exams on Admission: Dg Chest Portable 1 View  06/19/2013   CLINICAL DATA:  Respiratory distress.  EXAM: PORTABLE CHEST - 1 VIEW  COMPARISON:  01/08/2013  FINDINGS: Cardiac enlargement without vascular congestion. Diffuse interstitial pattern to the lungs suggesting interstitial edema or pneumonitis. This appearance is more prominent than on the previous study suggesting acute process. Fibrosis or atelectasis in the lung bases. Tortuous aorta. No blunting of costophrenic angles. No pneumothorax. Mediastinal contours appear intact.   IMPRESSION: Mild cardiac enlargement without vascular congestion. Suggestion of interstitial infiltrates in the lungs possibly representing pneumonitis or edema.   Electronically Signed   By: Burman Nieves M.D.   On: 06/19/2013 03:23    EKG: Independently reviewed. Normal sinus rhythm.  Assessment/Plan Principal Problem:   SOB (shortness of breath) Active Problems:   Diabetes   CHF (congestive heart failure)   1. Shortness of breath - most likely from decompensated CHF. Last EF measured in January 2014 was 55%. At this time I have placed patient on Lasix 40 mg IV every 12. Closely follow intake output and metabolic panel. Since chest x-ray showing possible infiltrates I have added Levaquin. Check procalcitonin levels and if normal discontinue antibiotics. Due to lower extremity edema Dopplers  have been ordered to rule out DVT. 2. Diabetes mellitus type 2 - closely follow CBGs with sliding-scale coverage. 3. History of hyperthyroidism - patient previously used to be on methimazole but I do not see that on her medication list. Please confirm in a.m. with patient's pharmacy or PCP and restart accordingly. For now I am checking thyroid function tests. 4. History of COPD - presently not wheezing. When necessary albuterol nebulizer. 5. History of bladder cancer - following up with Dr. Vernie Ammons.    Code Status: Full code.  Family Communication: Patient's daughter through the phone.  Disposition Plan: Admit to inpatient.    Alizey Noren N. [facility] Pager [phone].  If 7PM-7AM, please contact night-coverage [email or web address] Password [facility] 06/19/2013, 5:58 AM

## 2013-06-19 NOTE — ED Notes (Signed)
Admitting MD at bedside.

## 2013-06-19 NOTE — ED Notes (Signed)
Bed: WU98 Expected date:  Expected time:  Means of arrival:  Comments: Shortness of breath

## 2013-06-19 NOTE — ED Notes (Signed)
Pt had received her breakfast tray. Nurse was notified.

## 2013-06-19 NOTE — ED Notes (Signed)
Patient O2 decreased to 3LNC. Will con't to monitor.

## 2013-06-19 NOTE — Progress Notes (Signed)
   CARE MANAGEMENT ED NOTE 06/19/2013  Patient:  Sonya Oliver, Sonya Oliver   Account Number:  [id number]  Date Initiated:  06/19/2013  Documentation initiated by:  Edd Arbour  Subjective/Objective Assessment:   72 yr old [employer] medicare complete patient with sob,imaging= features concerning for CHF & possible pneumonia. admitted for further management.  started on IV Lasix 40 mg & also has been started on Levaquin for possible PNA     Subjective/Objective Assessment Detail:     Action/Plan:   UR completed pending secondary review   Action/Plan Detail:   Anticipated DC Date:       Status Recommendation to Physician:   Result of Recommendation:    Other ED Services  Consult Working Plan    DC Planning Services  Other  Outpatient Services - Pt will follow up    Choice offered to / List presented to:            Status of service:  Completed, signed off  ED Comments:   ED Comments Detail:

## 2013-06-20 DIAGNOSIS — J9620 Acute and chronic respiratory failure, unspecified whether with hypoxia or hypercapnia: Secondary | ICD-10-CM

## 2013-06-20 DIAGNOSIS — I5033 Acute on chronic diastolic (congestive) heart failure: Principal | ICD-10-CM

## 2013-06-20 LAB — CBC
Hemoglobin: 12.6 g/dL (ref 12.0–15.0)
MCH: 27.8 pg (ref 26.0–34.0)
MCHC: 32.3 g/dL (ref 30.0–36.0)
MCV: 85.9 fL (ref 78.0–100.0)
RDW: 15.4 % (ref 11.5–15.5)
WBC: 8.3 10*3/uL (ref 4.0–10.5)

## 2013-06-20 LAB — GLUCOSE, CAPILLARY
Glucose-Capillary: 108 mg/dL — ABNORMAL HIGH (ref 70–99)
Glucose-Capillary: 106 mg/dL — ABNORMAL HIGH (ref 70–99)

## 2013-06-20 LAB — BASIC METABOLIC PANEL
BUN: 19 mg/dL (ref 6–23)
Calcium: 9.4 mg/dL (ref 8.4–10.5)
Chloride: 96 mEq/L (ref 96–112)
Creatinine, Ser: 0.64 mg/dL (ref 0.50–1.10)
GFR calc Af Amer: >90 mL/min (ref >90)
GFR calc non Af Amer: 86 mL/min — ABNORMAL LOW (ref >90)
Glucose, Bld: 95 mg/dL (ref 70–99)
Potassium: 3.6 mEq/L (ref 3.5–5.1)

## 2013-06-20 NOTE — Progress Notes (Signed)
Patient complains of SOB and chest tightness. Assessed Breathe sounds are diminished , O2 sats 96 % on 3l [place]. Called resp. To assess and given PRN albuterol tx. Patient tolerated well.  And SOB was resolved.

## 2013-06-20 NOTE — Progress Notes (Addendum)
[facility] PROGRESS NOTE  Sonya Oliver ZOX:[id number] DOB: [birth year] DOA: 06/19/2013 PCP: [name], MD  Assessment/Plan  Acute on chronic hypoxic respiratory failure likely secondary to diastolic heart failure vs. Right heart failure.  Last ECHO 08/2012 demonstrated preserved EF, no comment of diastolic heart failure.  Her RV was mildly dilated and PA peak pressure was 57mm Hg.  Pro-BNP mildly elevated but cardiomegaly and vascular congestion on CXR -  ECHO  -  Continue daily weights and strict I/O -  Continue lasix 40mg  IV BID - -1.6 L yesterday  Possible PNA, however, procalcitonin neg -  D/C abx  T2DM, FS 90s-120s -  Continue low dose SSI  Hyperthyroidism, previously on methimazole.   -   TFTs demonstrate low TSH and normal fT4 and low normal fT3 -  Endocrinology referral  COPD, stable.  Continue albuterol prn  Hx of bladder CA, follow up with Dr. Vernie Ammons  Diet:  diabetic Access:  PIV IVF:  OFF Proph:  lovenox  Code Status: full Family Communication: patient alone Disposition Plan: pending improvement in dyspnea.  PT eval pending.  Will likely go home with [facility] services   Consultants:  none  Procedures:  CXR  Antibiotics:  Levofloxacin 10/20 >> 10/22   HPI/Subjective:  Patient states she continues to feel better, but weak.     Objective: Filed Vitals:   06/19/13 2009 06/20/13 0118 06/20/13 0419 06/20/13 1508  BP: 119/54  132/60 112/55  Pulse: 75  76 71  Temp: 97.8 F (36.6 C)  98.2 F (36.8 C) 98.3 F (36.8 C)  TempSrc: Oral  Oral Oral  Resp: 20  20 18   Height:      Weight:   74.9 kg (165 lb 2 oz)   SpO2: 99% 97% 99% 96%    Intake/Output Summary (Last 24 hours) at 06/20/13 2019 Last data filed at 06/20/13 1900  Gross per 24 hour  Intake   1260 ml  Output   2730 ml  Net  -1470 ml   Filed Weights   06/19/13 0225 06/19/13 1448 06/20/13 0419  Weight: 77.111 kg (170 lb) 75.2 kg (165 lb 12.6 oz) 74.9 kg (165 lb 2 oz)     Exam:   General:  No acute distress  HEENT:  NCAT, MMM  Cardiovascular:  RRR, nl S1, S2 no mrg, 2+ pulses, warm extremities  Respiratory:  Diminished bilateral breath sounds with full exp wheeze, no focal rales or rhonchi, no increased WOB  Abdomen:   NABS, soft, NT/ND  MSK:   Normal tone and bulk, + LEE  Neuro:  Grossly intact  Data Reviewed: Basic Metabolic Panel:  Recent Labs Lab 06/19/13 0300 06/19/13 0318 06/19/13 1540 06/20/13 0519  NA 132* 140 136 133*  K 3.8 3.9 3.9 3.6  CL 94* 99 96 96  CO2 30  --  32 32  GLUCOSE 116* 115* 185* 95  BUN 18 19 16 19   CREATININE 0.70 0.90 0.74 0.64  CALCIUM 9.8  --  9.8 9.4   Liver Function Tests:  Recent Labs Lab 06/19/13 0300 06/19/13 1540  AST 15 12  ALT 7 6  ALKPHOS 73 61  BILITOT 0.7 0.6  PROT 8.9* 7.9  ALBUMIN 3.7 3.1*   No results found for this basename: LIPASE, AMYLASE,  in the last 168 hours No results found for this basename: AMMONIA,  in the last 168 hours CBC:  Recent Labs Lab 06/19/13 0300 06/19/13 0318 06/19/13 1540 06/20/13 0519  WBC 8.4  --  7.0 8.3  NEUTROABS  --   --  5.6  --   HGB 13.6 15.6* 12.7 12.6  HCT 40.8 46.0 39.1 39.0  MCV 85.4  --  85.4 85.9  PLT 136*  --  141* 131*   Cardiac Enzymes:  Recent Labs Lab 06/19/13 0300 06/19/13 1540  TROPONINI <0.30 <0.30   BNP (last 3 results)  Recent Labs  09/12/12 1423 06/19/13 0300  PROBNP 653.9* 198.2*   CBG:  Recent Labs Lab 06/19/13 1725 06/19/13 2223 06/20/13 0736 06/20/13 1239 06/20/13 1655  GLUCAP 123* 111* 92 103* 108*    No results found for this or any previous visit (from the past 240 hour(s)).   Studies: Dg Chest Portable 1 View  06/19/2013   CLINICAL DATA:  Respiratory distress.  EXAM: PORTABLE CHEST - 1 VIEW  COMPARISON:  01/08/2013  FINDINGS: Cardiac enlargement without vascular congestion. Diffuse interstitial pattern to the lungs suggesting interstitial edema or pneumonitis. This appearance is  more prominent than on the previous study suggesting acute process. Fibrosis or atelectasis in the lung bases. Tortuous aorta. No blunting of costophrenic angles. No pneumothorax. Mediastinal contours appear intact.  IMPRESSION: Mild cardiac enlargement without vascular congestion. Suggestion of interstitial infiltrates in the lungs possibly representing pneumonitis or edema.   Electronically Signed   By: Burman Nieves M.D.   On: 06/19/2013 03:23    Scheduled Meds: . carvedilol  3.125 mg Oral BID WC  . enoxaparin (LOVENOX) injection  40 mg Subcutaneous Q24H  . furosemide  40 mg Intravenous BID  . insulin aspart  0-9 Units Subcutaneous TID WC  . levofloxacin (LEVAQUIN) IV  750 mg Intravenous QAC breakfast  . potassium chloride SA  20 mEq Oral Daily  . sodium chloride  3 mL Intravenous Q12H  . sodium chloride  3 mL Intravenous Q12H   Continuous Infusions:   Principal Problem:   SOB (shortness of breath) Active Problems:   Diabetes   CHF (congestive heart failure)    Time spent: 30 min    Ronan Dion, [facility]  [facility] Pager [phone]. If 7PM-7AM, please contact night-coverage at [email or web address], password [facility] 06/20/2013, 8:19 PM  LOS: 1 day

## 2013-06-20 NOTE — Progress Notes (Signed)
Pt was sleeping in chair with oxygen off. Oxygen sat was 85%.  Oxygen reapplied at 2LPM [place]. Dr. Malachi Bonds notified.   Will continue to monitor.

## 2013-06-20 NOTE — Progress Notes (Signed)
Patient is active with [facility] Care Management services. [facility] Community Care Coordinator reports patient had been doing well with weighing herself and managing at home with diet and medication. Spoke with patient at bedside to make aware [facility] Coordinator will continue to follow. Also made patient aware that she could benefit from home health services as well for the more acute phase of hospital discharge. Patient agreeable. Explained the [facility] Care Management will not interfere with home health. She will receive post hospital discharge call and will continue to receive monthly home visits. Spoke with inpatient RNCM about patient being active with [facility] Care Management.  Raiford Noble, MSN- Ed, RN,BSN -- [facility] [phone]

## 2013-06-21 DIAGNOSIS — I5033 Acute on chronic diastolic (congestive) heart failure: Principal | ICD-10-CM

## 2013-06-21 DIAGNOSIS — J9620 Acute and chronic respiratory failure, unspecified whether with hypoxia or hypercapnia: Secondary | ICD-10-CM

## 2013-06-21 HISTORY — PX: TRANSTHORACIC ECHOCARDIOGRAM: SHX275

## 2013-06-21 LAB — BASIC METABOLIC PANEL
Calcium: 9.3 mg/dL (ref 8.4–10.5)
Creatinine, Ser: 0.74 mg/dL (ref 0.50–1.10)
GFR calc Af Amer: >90 mL/min (ref >90)
GFR calc non Af Amer: 82 mL/min — ABNORMAL LOW (ref >90)
Sodium: 132 mEq/L — ABNORMAL LOW (ref 135–145)

## 2013-06-21 LAB — GLUCOSE, CAPILLARY
Glucose-Capillary: 80 mg/dL (ref 70–99)
Glucose-Capillary: 147 mg/dL — ABNORMAL HIGH (ref 70–99)

## 2013-06-21 LAB — PROCALCITONIN: Procalcitonin: <0.1 ng/mL

## 2013-06-21 MED ORDER — BISACODYL 5 MG PO TBEC
10.0000 mg | DELAYED_RELEASE_TABLET | Freq: Once | ORAL | Status: AC
  Administered 2013-06-21: 10 mg via ORAL
  Filled 2013-06-21: qty 2

## 2013-06-21 MED ORDER — DOCUSATE SODIUM 100 MG PO CAPS
200.0000 mg | ORAL_CAPSULE | Freq: Two times a day (BID) | ORAL | Status: DC
  Administered 2013-06-21 – 2013-06-23 (×4): 200 mg via ORAL
  Filled 2013-06-21 (×5): qty 2

## 2013-06-21 MED ORDER — CAPSAICIN 0.025 % EX CREA
TOPICAL_CREAM | Freq: Two times a day (BID) | CUTANEOUS | Status: DC
  Administered 2013-06-21 – 2013-06-23 (×5): via TOPICAL
  Filled 2013-06-21: qty 56.6

## 2013-06-21 NOTE — Progress Notes (Signed)
*  PRELIMINARY RESULTS* Echocardiogram 2D Echocardiogram has been performed.  Sonya Oliver 06/21/2013, 4:07 PM

## 2013-06-21 NOTE — Progress Notes (Addendum)
[facility] PROGRESS NOTE  Sonya Oliver ZOX:[id number] DOB: 1940-11-19 DOA: 06/19/2013 PCP: [name], MD  Assessment/Plan  Acute on chronic hypoxic respiratory failure likely secondary to diastolic heart failure vs. right heart failure.  Last ECHO 08/2012 demonstrated preserved EF, no comment of diastolic heart failure.  Her RV was mildly dilated and PA peak pressure was 57mm Hg.  Pro-BNP mildly elevated but cardiomegaly and vascular congestion on CXR.  Still hypoxic on room air, but edema and SOB still improving.  Dry weight unknown.  Will diurese until mild AKI or signs of hypotension -  ECHO pending  -  Continue daily weights and strict I/O -  Continue lasix 40mg  IV BID - -1.6 L again yesterday  Possible PNA, however, procalcitonin neg.  Monitor off of antibiotics  T2DM, FS 90s-120s -  Continue low dose SSI  Hyperthyroidism, previously on methimazole.   -   TFTs demonstrate low TSH and normal fT4 and low normal fT3 -  Endocrinology referral  COPD, stable.  Continue albuterol prn  Hx of bladder CA, follow up with Dr. Vernie Ammons  Bilateral foot pain, may be related to swelling and arthritis. No warmth or erythema to suggest gout. -  Capsaicin cream  Diet:  diabetic Access:  PIV IVF:  OFF Proph:  lovenox  Code Status: full Family Communication: patient alone Disposition Plan: pending improvement in dyspnea.  To home with [facility] services after further diuresis   Consultants:  none  Procedures:  CXR  Antibiotics:  Levofloxacin 10/20 >> 10/22   HPI/Subjective:  Patient states she continues to feel better, but weak.  SOB continues to improve, but she continues to desaturate to the mid-80s when her oxygen comes off at rest.    Objective: Filed Vitals:   06/20/13 1508 06/20/13 2146 06/21/13 0501 06/21/13 0824  BP: 112/55 128/55 116/56 107/47  Pulse: 71 72 69 75  Temp: 98.3 F (36.8 C) 99.1 F (37.3 C) 98.1 F (36.7 C)   TempSrc: Oral Oral Oral    Resp: 18 20 22    Height:      Weight:   74.2 kg (163 lb 9.3 oz)   SpO2: 96% 92% 92%     Intake/Output Summary (Last 24 hours) at 06/21/13 1322 Last data filed at 06/21/13 1300  Gross per 24 hour  Intake   1080 ml  Output   2300 ml  Net  -1220 ml   Filed Weights   06/19/13 1448 06/20/13 0419 06/21/13 0501  Weight: 75.2 kg (165 lb 12.6 oz) 74.9 kg (165 lb 2 oz) 74.2 kg (163 lb 9.3 oz)    Exam:   General:  AAF, No acute distress, nasal canula in place  HEENT:  NCAT, MMM  Cardiovascular:  RRR, nl S1, S2 no mrg, 2+ pulses, warm extremities  Respiratory:  Diminished bilateral breath sounds without wheeze, rales or rhonchi, no increased WOB  Abdomen:   NABS, soft, NT/ND  MSK:   Normal tone and bulk, bilateral slow pitting 2+ LEE  Neuro:  Grossly intact  Data Reviewed: Basic Metabolic Panel:  Recent Labs Lab 06/19/13 0300 06/19/13 0318 06/19/13 1540 06/20/13 0519 06/21/13 0600  NA 132* 140 136 133* 132*  K 3.8 3.9 3.9 3.6 3.6  CL 94* 99 96 96 92*  CO2 30  --  32 32 34*  GLUCOSE 116* 115* 185* 95 79  BUN 18 19 16 19 23   CREATININE 0.70 0.90 0.74 0.64 0.74  CALCIUM 9.8  --  9.8 9.4 9.3  Liver Function Tests:  Recent Labs Lab 06/19/13 0300 06/19/13 1540  AST 15 12  ALT 7 6  ALKPHOS 73 61  BILITOT 0.7 0.6  PROT 8.9* 7.9  ALBUMIN 3.7 3.1*   No results found for this basename: LIPASE, AMYLASE,  in the last 168 hours No results found for this basename: AMMONIA,  in the last 168 hours CBC:  Recent Labs Lab 06/19/13 0300 06/19/13 0318 06/19/13 1540 06/20/13 0519  WBC 8.4  --  7.0 8.3  NEUTROABS  --   --  5.6  --   HGB 13.6 15.6* 12.7 12.6  HCT 40.8 46.0 39.1 39.0  MCV 85.4  --  85.4 85.9  PLT 136*  --  141* 131*   Cardiac Enzymes:  Recent Labs Lab 06/19/13 0300 06/19/13 1540  TROPONINI <0.30 <0.30   BNP (last 3 results)  Recent Labs  09/12/12 1423 06/19/13 0300  PROBNP 653.9* 198.2*   CBG:  Recent Labs Lab 06/20/13 1239  06/20/13 1655 06/20/13 2148 06/21/13 0726 06/21/13 1209  GLUCAP 103* 108* 106* 80 94    No results found for this or any previous visit (from the past 240 hour(s)).   Studies: No results found.  Scheduled Meds: . capsaicin   Topical BID  . carvedilol  3.125 mg Oral BID WC  . enoxaparin (LOVENOX) injection  40 mg Subcutaneous Q24H  . furosemide  40 mg Intravenous BID  . insulin aspart  0-9 Units Subcutaneous TID WC  . potassium chloride SA  20 mEq Oral Daily  . sodium chloride  3 mL Intravenous Q12H  . sodium chloride  3 mL Intravenous Q12H   Continuous Infusions:   Principal Problem:   Acute on chronic diastolic heart failure Active Problems:   Hyperthyroidism   SOB (shortness of breath)   Diabetes   CHF (congestive heart failure)   Acute-on-chronic respiratory failure    Time spent: 30 min    Sonya Oliver, [facility]  [facility] Pager [phone]. If 7PM-7AM, please contact night-coverage at [email or web address], password [facility] 06/21/2013, 1:22 PM  LOS: 2 days

## 2013-06-21 NOTE — Evaluation (Signed)
Occupational Therapy Evaluation Patient Details Name: Sonya Oliver MRN: [id number] DOB: Dec 17, 1940 Today's Date: 06/21/2013 Time: 4098-1191 OT Time Calculation (min): 22 min  OT Assessment / Plan / Recommendation History of present illness 72 y.o. female with known history of CHF last EF measured was in January 2014 was 55%, diabetes mellitus type 2, hypothyroidism, COPD, bladder cancer presented to the ER because of increasing shortness of breath and increasing lower extremity edema. Denies any chest pain or any fever chills. In the ER chest x-ray was showing features concerning for CHF and possible pneumonia.   Clinical Impression  Pt was admitted for the above.  At baseline, she is mod I with adls.  She is appropriate for skilled OT to increase safety and activity tolerance with supervision to set up level goals in acute.      OT Assessment  Patient needs continued OT Services    Follow Up Recommendations  Home health OT    Barriers to Discharge      Equipment Recommendations   (discussed shower seat/bench:  pt aware of them)    Recommendations for Other Services    Frequency  Min 2X/week    Precautions / Restrictions Precautions Precautions: Fall Restrictions Weight Bearing Restrictions: No   Pertinent Vitals/Pain No pain.  Sats 91 -94% on l liter 02    ADL  Grooming: Teeth care;Supervision/safety Where Assessed - Grooming: Supported standing Upper Body Bathing: Set up Where Assessed - Upper Body Bathing: Unsupported sitting Lower Body Bathing: Minimal assistance Where Assessed - Lower Body Bathing: Supported sit to stand Upper Body Dressing: Set up Where Assessed - Upper Body Dressing: Unsupported sitting Lower Body Dressing: Min guard (extra time for socks) Where Assessed - Lower Body Dressing: Supported sit to Pharmacist, hospital: Counsellor Method: Sit to Barista:  Nurse, children's) Toileting - Designer, fashion/clothing and Hygiene: Min guard Where Assessed - Engineer, mining and Hygiene: Sit to stand from 3-in-1 or toilet Equipment Used: Rolling walker Transfers/Ambulation Related to ADLs: pt stepped over to sink, about 2 feet for teeth:  min guard assistance given ADL Comments: Pt bends forward to don socks.  educated on energy conservation.  Pt states at home she wears little "footies" which she can don with one hand.  Daughters will help her with anything (all work) but pt likes to be as independent at [employer]    OT Diagnosis: Generalized weakness  OT Problem List: Decreased strength;Decreased activity tolerance;Impaired balance (sitting and/or standing);Cardiopulmonary status limiting activity;Decreased knowledge of use of DME or AE OT Treatment Interventions: Self-care/ADL training;DME and/or AE instruction;Balance training;Patient/family education;Energy conservation   OT Goals(Current goals can be found in the care plan section) Acute Rehab OT Goals OT Goal Formulation: With patient Time For Goal Achievement: 07/05/13 Potential to Achieve Goals: Good ADL Goals Pt Will Perform Lower Body Bathing: with set-up;sit to/from stand Pt Will Perform Lower Body Dressing: with set-up;sit to/from stand Pt Will Transfer to Toilet: with supervision;ambulating;bedside commode Pt Will Perform Toileting - Clothing Manipulation and hygiene: with supervision;sit to/from stand Additional ADL Goal #1: pt will initiate at least one rest break per session for adls  Visit Information         Prior Functioning     Home Living Family/patient expects to be discharged to:: Private residence Living Arrangements: Alone Available Help at Discharge: Family;Available PRN/intermittently Type of Home: Apartment Home Access: Level entry Home Layout: One level Home Equipment: Bedside commode Additional Comments: only wears 02 at night.  she does everything x laundry.  Daughter goes grocery  shopping with her Prior Function Level of Independence: Independent with assistive device(s) Communication Communication: No difficulties         Vision/Perception     Cognition  Cognition Arousal/Alertness: Awake/alert Behavior During Therapy: WFL for tasks assessed/performed Overall Cognitive Status: Within Functional Limits for tasks assessed    Extremity/Trunk Assessment Upper Extremity Assessment Upper Extremity Assessment: Overall WFL for tasks assessed Lower Extremity Assessment Lower Extremity Assessment: Generalized weakness;LLE deficits/detail LLE Deficits / Details: pt reports need for TKR, knee extension 3/5 and pt reports pain with resistance     Mobility Bed Mobility Bed Mobility: Not assessed Details for Bed Mobility Assistance: pt up on BSC upon entering Transfers Sit to Stand: 4: Min guard;With upper extremity assist Stand to Sit: 4: Min guard;With upper extremity assist Details for Transfer Assistance: no vcs needed for transfer     Exercise     Balance     End of Session OT - End of Session Activity Tolerance: Patient tolerated treatment well Patient left: in chair;with call bell/phone within reach  GO     Bexton Haak 06/21/2013, 1:14 PM Marica Otter, OTR/L [phone] 06/21/2013

## 2013-06-21 NOTE — Evaluation (Signed)
Physical Therapy Evaluation Patient Details Name: Sonya Oliver MRN: [id number] DOB: 1940/12/23 Today's Date: 06/21/2013 Time: 4098-1191 PT Time Calculation (min): 20 min  PT Assessment / Plan / Recommendation History of Present Illness  72 y.o. female with known history of CHF last EF measured was in January 2014 was 55%, diabetes mellitus type 2, hypothyroidism, COPD, bladder cancer presented to the ER because of increasing shortness of breath. Patient states that her shortness of breath increased from yesterday with increasing lower extremity edema. Denies any chest pain or any fever chills. In the ER chest x-ray was showing features concerning for CHF and possible pneumonia. Patient has been admitted for further management. Patient was started on IV Lasix 40 mg and also has been started on Levaquin for possible pneumonia.  Clinical Impression  Pt admitted with above. Pt currently with functional limitations due to the deficits listed below (see PT Problem List).  Pt will benefit from skilled PT to increase their independence and safety with mobility to allow discharge to the venue listed below.  Pt ambulated in hallway with RW however required O2 [place] (see vitals section).  Pt reports she feels comfortable returning home as her daughter and grandson can provide 24/7 care if needed.     PT Assessment  Patient needs continued PT services    Follow Up Recommendations  Home health PT    Does the patient have the potential to tolerate intense rehabilitation      Barriers to Discharge        Equipment Recommendations  None recommended by PT    Recommendations for Other Services     Frequency Min 3X/week    Precautions / Restrictions Precautions Precautions: Fall Restrictions Weight Bearing Restrictions: No   Pertinent Vitals/Pain SaO2 on room air at rest 86% SaO2 on 1L O2 after ambulation 86% however increased to 93% with rest      Mobility  Bed Mobility Bed Mobility: Not  assessed Details for Bed Mobility Assistance: pt up on [facility] upon entering Transfers Transfers: Sit to Stand;Stand to Sit;Stand Pivot Transfers Sit to Stand: 4: Min guard;With upper extremity assist Stand to Sit: 4: Min guard;With upper extremity assist Stand Pivot Transfers: 4: Min guard Details for Transfer Assistance: verbal cues for safe technique, pt assisted from [facility] to sitting EOB, O2 [place] off as pt was washing face and SaO2 on room air 86% so reapplied 1L O2  Ambulation/Gait Ambulation/Gait Assistance: 4: Min guard Ambulation Distance (Feet): 320 Feet Assistive device: Rolling walker Ambulation/Gait Assistance Details: verbal cues for safe RW distance, very slow pace, maintained 1L O2  and SaO2 86% upon return to room however increased quickly to 93% with rest Gait Pattern: Step-through pattern;Decreased stride length;Trunk flexed Gait velocity: decreased    Exercises     PT Diagnosis: Difficulty walking;Generalized weakness  PT Problem List: Decreased strength;Decreased activity tolerance;Decreased mobility;Decreased knowledge of use of DME PT Treatment Interventions: DME instruction;Gait training;Functional mobility training;Therapeutic activities;Therapeutic exercise;Patient/family education     PT Goals(Current goals can be found in the care plan section) Acute Rehab PT Goals PT Goal Formulation: With patient Time For Goal Achievement: 06/28/13 Potential to Achieve Goals: Good  Visit Information  Last PT Received On: 06/21/13 Assistance Needed: +1 History of Present Illness: 72 y.o. female with known history of CHF last EF measured was in January 2014 was 55%, diabetes mellitus type 2, hypothyroidism, COPD, bladder cancer presented to the ER because of increasing shortness of breath. Patient states that her shortness of breath increased  from yesterday with increasing lower extremity edema. Denies any chest pain or any fever chills. In the ER chest x-ray was showing  features concerning for CHF and possible pneumonia. Patient has been admitted for further management. Patient was started on IV Lasix 40 mg and also has been started on Levaquin for possible pneumonia.       Prior Functioning  Home Living Family/patient expects to be discharged to:: Private residence Living Arrangements: Alone Available Help at Discharge: Family;Available PRN/intermittently Type of Home: Apartment Home Access: Level entry Home Layout: One level Home Equipment: Walker - 2 wheels;Cane - single point;Bedside commode Prior Function Level of Independence: Independent with assistive device(s) Communication Communication: No difficulties    Cognition  Cognition Arousal/Alertness: Awake/alert Behavior During Therapy: WFL for tasks assessed/performed Overall Cognitive Status: Within Functional Limits for tasks assessed    Extremity/Trunk Assessment Lower Extremity Assessment Lower Extremity Assessment: Generalized weakness;LLE deficits/detail LLE Deficits / Details: pt reports need for TKR, knee extension 3/5 and pt reports pain with resistance   Balance    End of Session PT - End of Session Equipment Utilized During Treatment: Oxygen Activity Tolerance: Patient tolerated treatment well Patient left: in chair;with call bell/phone within reach  GP     [facility] E 06/21/2013, 9:47 AM Zenovia Jarred, PT, DPT 06/21/2013 Pager: [phone]

## 2013-06-22 DIAGNOSIS — D649 Anemia, unspecified: Secondary | ICD-10-CM

## 2013-06-22 LAB — BASIC METABOLIC PANEL
BUN: 20 mg/dL (ref 6–23)
CO2: 32 mEq/L (ref 19–32)
Calcium: 9.7 mg/dL (ref 8.4–10.5)
Chloride: 95 mEq/L — ABNORMAL LOW (ref 96–112)
Creatinine, Ser: 0.62 mg/dL (ref 0.50–1.10)
GFR calc Af Amer: >90 mL/min (ref >90)
GFR calc non Af Amer: 87 mL/min — ABNORMAL LOW (ref >90)
Sodium: 135 mEq/L (ref 135–145)

## 2013-06-22 LAB — GLUCOSE, CAPILLARY
Glucose-Capillary: 105 mg/dL — ABNORMAL HIGH (ref 70–99)
Glucose-Capillary: 112 mg/dL — ABNORMAL HIGH (ref 70–99)
Glucose-Capillary: 126 mg/dL — ABNORMAL HIGH (ref 70–99)

## 2013-06-22 MED ORDER — FUROSEMIDE 10 MG/ML IJ SOLN
80.0000 mg | Freq: Two times a day (BID) | INTRAMUSCULAR | Status: DC
  Administered 2013-06-22: 19:00:00 80 mg via INTRAVENOUS
  Filled 2013-06-22 (×5): qty 8

## 2013-06-22 NOTE — Progress Notes (Signed)
[facility] PROGRESS NOTE  Sonya Oliver ZOX:[id number] DOB: 26-Feb-1941 DOA: 06/19/2013 PCP: [name], MD  Assessment/Plan  Acute on chronic hypoxic respiratory failure likely secondary to diastolic heart failure vs. right heart failure.  Last ECHO 08/2012 demonstrated preserved EF, no comment of diastolic heart failure.  Her RV was mildly dilated and PA peak pressure was 57mm Hg.  Pro-BNP mildly elevated but cardiomegaly and vascular congestion on CXR.  Still hypoxic on room air, but edema and SOB still improving.  Dry weight unknown.  Will diurese until mild AKI or signs of hypotension -  ECHO demonstrates grade 1 DD, preserved EF, mild MR -  Continue daily weights and strict I/O -  Increase lasix to 80mg  IV BID because uop decreasing, noticed by patient and nursing staff -  CXR and pro-BNP in AM -  Will discharge with home oxygen  Possible PNA, however, procalcitonin neg.  Monitor off of antibiotics  T2DM, FS 90s-120s -  Continue low dose SSI  Hyperthyroidism, previously on methimazole.   -   TFTs demonstrate low TSH and normal fT4 and low normal fT3 -  Endocrinology referral  COPD, stable.  Continue albuterol prn  Hx of bladder CA, follow up with Dr. Vernie Ammons  Bilateral foot pain, may be related to swelling and arthritis. No warmth or erythema to suggest gout. -  Capsaicin cream  Diet:  diabetic Access:  PIV IVF:  OFF Proph:  lovenox  Code Status: full Family Communication: patient alone Disposition Plan:  To home with [facility] services possibly tomorrow.  Awaiting evidence that patient is near euvolemia   Consultants:  none  Procedures:  CXR  Antibiotics:  Levofloxacin 10/20 >> 10/22   HPI/Subjective:  Patient states she continues not back to her baseline, but her lower extremity swelling is improving.  She noticed that her uop has tapered off some.      Objective: Filed Vitals:   06/22/13 0540 06/22/13 0928 06/22/13 0935 06/22/13 1328   BP: 122/47 129/59  118/56  Pulse: 66 72  68  Temp: 98 F (36.7 C)   98 F (36.7 C)  TempSrc: Oral   Oral  Resp: 20   20  Height:      Weight: 74.8 kg (164 lb 14.5 oz)     SpO2: 96% 86% 94% 94%    Intake/Output Summary (Last 24 hours) at 06/22/13 1724 Last data filed at 06/22/13 1329  Gross per 24 hour  Intake   1040 ml  Output   1750 ml  Net   -710 ml   Filed Weights   06/20/13 0419 06/21/13 0501 06/22/13 0540  Weight: 74.9 kg (165 lb 2 oz) 74.2 kg (163 lb 9.3 oz) 74.8 kg (164 lb 14.5 oz)    Exam:   General:  AAF, No acute distress, nasal canula in place  HEENT:  NCAT, MMM  Cardiovascular:  RRR, nl S1, S2 no mrg, 2+ pulses, warm extremities  Respiratory:  Diminished bilateral breath sounds without wheeze, rales or rhonchi, no increased WOB  Abdomen:   NABS, soft, NT/ND  MSK:   Normal tone and bulk, bilateral slow pitting 1+ LEE  Neuro:  Grossly intact  Data Reviewed: Basic Metabolic Panel:  Recent Labs Lab 06/19/13 0300 06/19/13 0318 06/19/13 1540 06/20/13 0519 06/21/13 0600 06/22/13 0510  NA 132* 140 136 133* 132* 135  K 3.8 3.9 3.9 3.6 3.6 3.5  CL 94* 99 96 96 92* 95*  CO2 30  --  32 32 34* 32  GLUCOSE 116* 115* 185* 95 79 83  BUN 18 19 16 19 23 20   CREATININE 0.70 0.90 0.74 0.64 0.74 0.62  CALCIUM 9.8  --  9.8 9.4 9.3 9.7   Liver Function Tests:  Recent Labs Lab 06/19/13 0300 06/19/13 1540  AST 15 12  ALT 7 6  ALKPHOS 73 61  BILITOT 0.7 0.6  PROT 8.9* 7.9  ALBUMIN 3.7 3.1*   No results found for this basename: LIPASE, AMYLASE,  in the last 168 hours No results found for this basename: AMMONIA,  in the last 168 hours CBC:  Recent Labs Lab 06/19/13 0300 06/19/13 0318 06/19/13 1540 06/20/13 0519  WBC 8.4  --  7.0 8.3  NEUTROABS  --   --  5.6  --   HGB 13.6 15.6* 12.7 12.6  HCT 40.8 46.0 39.1 39.0  MCV 85.4  --  85.4 85.9  PLT 136*  --  141* 131*   Cardiac Enzymes:  Recent Labs Lab 06/19/13 0300 06/19/13 1540  TROPONINI  <0.30 <0.30   BNP (last 3 results)  Recent Labs  09/12/12 1423 06/19/13 0300  PROBNP 653.9* 198.2*   CBG:  Recent Labs Lab 06/21/13 1209 06/21/13 1721 06/21/13 2107 06/22/13 0733 06/22/13 1203  GLUCAP 94 125* 147* 84 105*    No results found for this or any previous visit (from the past 240 hour(s)).   Studies: No results found.  Scheduled Meds: . capsaicin   Topical BID  . carvedilol  3.125 mg Oral BID WC  . docusate sodium  200 mg Oral BID  . enoxaparin (LOVENOX) injection  40 mg Subcutaneous Q24H  . furosemide  80 mg Intravenous BID  . insulin aspart  0-9 Units Subcutaneous TID WC  . potassium chloride SA  20 mEq Oral Daily  . sodium chloride  3 mL Intravenous Q12H  . sodium chloride  3 mL Intravenous Q12H   Continuous Infusions:   Principal Problem:   Acute on chronic diastolic heart failure Active Problems:   Hyperthyroidism   SOB (shortness of breath)   Diabetes   CHF (congestive heart failure)   Acute-on-chronic respiratory failure    Time spent: 30 min    Kelechi Orgeron, [facility]  [facility] Pager [phone]. If 7PM-7AM, please contact night-coverage at [email or web address], password [facility] 06/22/2013, 5:24 PM  LOS: 3 days

## 2013-06-22 NOTE — Progress Notes (Signed)
I have reviewed this note and agree with all findings. Kati Rieley Khalsa, PT, DPT Pager: [phone]   

## 2013-06-22 NOTE — Progress Notes (Signed)
Physical Therapy Treatment Patient Details Name: Sonya Oliver MRN: [id number] DOB: 1940-12-20 Today's Date: 06/22/2013 Time: 9147-8295 PT Time Calculation (min): 28 min  PT Assessment / Plan / Recommendation  History of Present Illness 72 y.o. female with known history of CHF last EF measured was in January 2014 was 55%, diabetes mellitus type 2, hypothyroidism, COPD, bladder cancer presented to the ER because of increasing shortness of breath. Patient states that her shortness of breath increased from yesterday with increasing lower extremity edema. Denies any chest pain or any fever chills. In the ER chest x-ray was showing features concerning for CHF and possible pneumonia. Patient has been admitted for further management. Patient was started on IV Lasix 40 mg and also has been started on Levaquin for possible pneumonia.   PT Comments   Pt making progress to goals as she was able to ambulate with less assist today and performed LE strengthening exercises in supine. Pt would continue to benefit from skilled PT to improve endurance, safety and strength for functional mobility.   Follow Up Recommendations  Home health PT     Does the patient have the potential to tolerate intense rehabilitation     Barriers to Discharge        Equipment Recommendations  None recommended by PT    Recommendations for Other Services    Frequency Min 3X/week   Progress towards PT Goals Progress towards PT goals: Progressing toward goals  Plan Current plan remains appropriate    Precautions / Restrictions Precautions Precautions: Fall Restrictions Weight Bearing Restrictions: No   Pertinent Vitals/Pain No c/o pain during session. SATURATION QUALIFICATIONS: (This note is used to comply with regulatory documentation for home oxygen)  Patient Saturations on Room Air at Rest = 96%  Patient Saturations on Room Air while Ambulating = 84%  Patient Saturations on 2 Liters of oxygen while Ambulating =  93-94%  Please briefly explain why patient needs home oxygen: Pt's SaO2 dropped to 84% while ambulating with RW and increased to 93-94% with 2L of O2 [place] reapplied and standing rest break.    Mobility  Bed Mobility Bed Mobility: Not assessed Details for Bed Mobility Assistance: pt sitting EOB upon arrival. Transfers Transfers: Sit to Stand;Stand to Sit Sit to Stand: 4: Min guard;With upper extremity assist;From bed Stand to Sit: 4: Min guard;With upper extremity assist;To chair/3-in-1 Details for Transfer Assistance: min guard to ensure safety and pt reports L knee occassionally buckles due to pain (history of OA). pt's SaO2 assessed throughout session, please see vitals section for detail. Ambulation/Gait Ambulation/Gait Assistance: 4: Min guard Ambulation Distance (Feet): 375 Feet Assistive device: Rolling walker Ambulation/Gait Assistance Details: min guard initially to ensure safety with pt progressing to supervision. VC's to stay within RW for safety. SaO2 dropped to 84% halfway through with no c/o SOB, PT reapplied 2L of O2 [place] and SaO2 increased to 93-94%. Gait Pattern: Step-through pattern;Decreased stride length;Trunk flexed Gait velocity: decreased    Exercises General Exercises - Lower Extremity Ankle Circles/Pumps: AROM;20 reps;Supine;Both Quad Sets: AROM;20 reps;Supine;Both Short Arc Quad: AROM;Supine;10 reps;Both (VC's for improved eccentric control) Heel Slides: AROM;Supine;Both;10 reps Hip ABduction/ADduction: AROM;Supine;Both;10 reps   PT Diagnosis:    PT Problem List:   PT Treatment Interventions:     PT Goals (current goals can now be found in the care plan section)    Visit Information  Last PT Received On: 06/22/13 Assistance Needed: +1 History of Present Illness: 72 y.o. female with known history of CHF last EF measured  was in January 2014 was 55%, diabetes mellitus type 2, hypothyroidism, COPD, bladder cancer presented to the ER because of increasing  shortness of breath. Patient states that her shortness of breath increased from yesterday with increasing lower extremity edema. Denies any chest pain or any fever chills. In the ER chest x-ray was showing features concerning for CHF and possible pneumonia. Patient has been admitted for further management. Patient was started on IV Lasix 40 mg and also has been started on Levaquin for possible pneumonia.    Subjective Data      Cognition  Cognition Arousal/Alertness: Awake/alert Behavior During Therapy: WFL for tasks assessed/performed Overall Cognitive Status: Within Functional Limits for tasks assessed    Balance     End of Session PT - End of Session Equipment Utilized During Treatment: Oxygen;Gait belt Activity Tolerance: Patient tolerated treatment well Patient left: in chair;with call bell/phone within reach   GP     Sonya Oliver 06/22/2013, 4:09 PM

## 2013-06-23 DIAGNOSIS — E0590 Thyrotoxicosis, unspecified without thyrotoxic crisis or storm: Secondary | ICD-10-CM

## 2013-06-23 LAB — BASIC METABOLIC PANEL
CO2: 34 mEq/L — ABNORMAL HIGH (ref 19–32)
Chloride: 95 mEq/L — ABNORMAL LOW (ref 96–112)
Creatinine, Ser: 0.6 mg/dL (ref 0.50–1.10)
Glucose, Bld: 102 mg/dL — ABNORMAL HIGH (ref 70–99)
Sodium: 136 mEq/L (ref 135–145)

## 2013-06-23 LAB — GLUCOSE, CAPILLARY

## 2013-06-23 MED ORDER — CAPSAICIN 0.025 % EX CREA: TOPICAL_CREAM | Freq: Two times a day (BID) | CUTANEOUS | Status: DC

## 2013-06-23 MED ORDER — FUROSEMIDE 40 MG PO TABS: 40.0000 mg | ORAL_TABLET | Freq: Two times a day (BID) | ORAL | Status: DC

## 2013-06-23 NOTE — Progress Notes (Signed)
Pt has BP of 110/49 per dinamap.  Held Coreg per parameters. Notified Dr. Malachi Bonds and orders received to hold IV lasix until assessed by MD today. Maeola Harman

## 2013-06-23 NOTE — Progress Notes (Signed)
Patient has home oxygen prior to admission. Pt states she does not need oxygen to travel to home, only uses oxygen as needed. [employer]

## 2013-06-23 NOTE — Discharge Summary (Signed)
Physician Discharge Summary  HARA MILHOLLAND WJX:[id number] DOB: 11-08-1940 DOA: 06/19/2013  PCP: [name], MD  Admit date: 06/19/2013 Discharge date: 06/23/2013  Recommendations for Outpatient Follow-up:  1. Follow up with primary care doctor in 1 week of discharge  2. Home PT/OT/RN and tele-monitoring arranged  Discharge Diagnoses:  Principal Problem:   Acute on chronic diastolic heart failure Active Problems:   Hyperthyroidism   SOB (shortness of breath)   Diabetes   CHF (congestive heart failure)   Acute-on-chronic respiratory failure   Discharge Condition: stable, improved  Diet recommendation: diabetic, low salt diet  Wt Readings from Last 3 Encounters:  06/23/13 74.3 kg (163 lb 12.8 oz)  03/22/13 78.744 kg (173 lb 9.6 oz)  01/08/13 83.008 kg (183 lb)    History of present illness:  Sonya Oliver is a 72 y.o. female with known history of CHF last EF measured was in January 2014 was 55%, diabetes mellitus type 2, hypothyroidism, COPD, bladder cancer presented to the ER because of increasing shortness of breath. Patient states that her shortness of breath increased from yesterday with increasing lower extremity edema. Denies any chest pain or any fever chills. In the ER chest x-ray was showing features concerning for CHF and possible pneumonia. Patient has been admitted for further management. Patient was started on IV Lasix 40 mg and also has been started on Levaquin for possible pneumonia.  Hospital Course:   Acute on chronic hypoxic respiratory failure likely secondary to diastolic heart failure vs. right heart failure.  She was treated empirically for HCAP given the suggestion of infiltrate on CXR, however, her WBC and temperature remained normal and her procalcitonin was <0.1, so her levofloxacin was discontinued.  She was started on IV lasix, the dose of which was increased to 80mg  IV BID and she was diuresed until her blood pressure decreased to the  100s/40s, her breathing improved, and her lower extremity edema had mostly resolved.  She had a repeat ECHO which demonstrated grade 1 DD, preserved EF, mild MR, stable from her previous ECHO in 08/2012.  Per pro-BNP was 198 and her dry weight was unknown.  Her weight on discharge is 74.3kg and although her weight did not change much, she was diuresed 5.5L.  She was advised to eat a low salt diet and weigh herself daily.  Home RN and tele-monitoring were arranged and she will follow up with her primary care doctor in less than one week.  I will increase her home lasix to 40mg  BID at discharge and I recommended that she use her oxygen continuously at 1L as her oxygen levels dipped to the mid 80s on room air even after diuresis, possibly due to COPD.    T2DM, FS 90s-120s.  Continue low dose SSI   Hyperthyroidism, previously on methimazole.  TFTs demonstrated low TSH and normal fT4 and low normal fT3:  TSH 0.019, fT4 1.27, fT3 2.4.  Recommend early referral to endocrinology.    COPD, stable. Continue albuterol prn   Hx of bladder CA, follow up with Dr. Vernie Ammons   Bilateral foot pain. No warmth or erythema to suggest gout so may be related to osteoarthritis.  Capsaicin cream helped considerably.   Consultants:  none Procedures:  CXR Antibiotics:  Levofloxacin 10/20 >> 10/22    Discharge Exam: Filed Vitals:   06/23/13 0837  BP: 110/49  Pulse: 63  Temp:   Resp:    Filed Vitals:   06/22/13 1903 06/22/13 2040 06/23/13 0552 06/23/13 0837  BP:  124/55 123/54 110/49  Pulse: 73 65 64 63  Temp:  98.3 F (36.8 C) 98.1 F (36.7 C)   TempSrc:  Oral Oral   Resp:  19 18   Height:      Weight:   74.3 kg (163 lb 12.8 oz)   SpO2:  100% 96%     General: AAF, No acute distress, nasal canula in place  HEENT: NCAT, MMM  Cardiovascular: RRR, nl S1, S2 no mrg, 2+ pulses, warm extremities  Respiratory: Diminished bilateral breath sounds without wheeze.  Faint basilar rales markedly improved.  No  rhonchi, no increased WOB  Abdomen: NABS, soft, NT/ND  MSK: Normal tone and bulk, trace LEE  Neuro: Grossly intact   Discharge Instructions      Discharge Orders   Future Appointments Provider Department Dept Phone   06/26/2013 10:40 AM Ileana Ladd, MD [place] [facility] [phone]   Future Orders Complete By Expires   (HEART FAILURE PATIENTS) Call MD:  Anytime you have any of the following symptoms: 1) 3 pound weight gain in 24 hours or 5 pounds in 1 week 2) shortness of breath, with or without a dry hacking cough 3) swelling in the hands, feet or stomach 4) if you have to sleep on extra pillows at night in order to breathe.  As directed    Call MD for:  difficulty breathing, headache or visual disturbances  As directed    Call MD for:  extreme fatigue  As directed    Call MD for:  hives  As directed    Call MD for:  persistant dizziness or light-headedness  As directed    Call MD for:  persistant nausea and vomiting  As directed    Call MD for:  severe uncontrolled pain  As directed    Call MD for:  temperature >100.4  As directed    Diet - low sodium heart healthy  As directed    Diet Carb Modified  As directed    Discharge instructions  As directed    Comments:     You were hospitalized with shortness of breath.  You probably had some diastolic heart failure, or fluid in the lungs because of some stiffness of the heart.  You were treated with IV lasix, a diuretic which helps get rid of extra fluid.  You lost about 5.5L of fluid during your stay and your breathing and swelling have improved.  Please increase your lasix to 40mg  TWO times per day at home instead of once a day.  Please eat a low salt diet and weigh yourself daily.  Call your primary care doctor if you gain more than 3-lbs in one day or 5-lbs in one week or if you notice your swelling is returning.  Return to the hospital if you have increased shortness of breath or difficulty breathing.  Your thyroid  tests were abnormal.  Please talk to your primary care doctor about returning to endocrinology or restarting your methimazole.   Increase activity slowly  As directed        Medication List    STOP taking these medications       nitrofurantoin (macrocrystal-monohydrate) 100 MG capsule  Commonly known as:  MACROBID      TAKE these medications       capsaicin 0.025 % cream  Commonly known as:  ZOSTRIX  Apply topically 2 (two) times daily. Apply to feet     carvedilol 3.125 MG tablet  Commonly known as:  COREG  TAKE 1 TABLET BY MOUTH TWICE DAILY     furosemide 40 MG tablet  Commonly known as:  LASIX  Take 1 tablet (40 mg total) by mouth 2 (two) times daily.     KLOR-CON M20 20 MEQ tablet  Generic drug:  potassium chloride SA  Take 20 mEq by mouth daily.     metFORMIN 500 MG tablet  Commonly known as:  GLUCOPHAGE  TAKE 1 TABLET BY MOUTH TWICE DAILY     Vitamin D3 2000 UNITS Tabs  Take 1 tablet by mouth daily.       Follow-up Information   Follow up with [name], MD. Schedule an appointment as soon as possible for a visit in 1 week.   Specialty:  Family Medicine   Contact information:   [address] [place] [address] [phone]        The results of significant diagnostics from this hospitalization (including imaging, microbiology, ancillary and laboratory) are listed below for reference.    Significant Diagnostic Studies: Dg Chest Portable 1 View  06/19/2013   CLINICAL DATA:  Respiratory distress.  EXAM: PORTABLE CHEST - 1 VIEW  COMPARISON:  01/08/2013  FINDINGS: Cardiac enlargement without vascular congestion. Diffuse interstitial pattern to the lungs suggesting interstitial edema or pneumonitis. This appearance is more prominent than on the previous study suggesting acute process. Fibrosis or atelectasis in the lung bases. Tortuous aorta. No blunting of costophrenic angles. No pneumothorax. Mediastinal contours appear intact.   IMPRESSION: Mild cardiac enlargement without vascular congestion. Suggestion of interstitial infiltrates in the lungs possibly representing pneumonitis or edema.   Electronically Signed   By: Burman Nieves M.D.   On: 06/19/2013 03:23    Microbiology: No results found for this or any previous visit (from the past 240 hour(s)).   Labs: Basic Metabolic Panel:  Recent Labs Lab 06/19/13 1540 06/20/13 0519 06/21/13 0600 06/22/13 0510 06/23/13 0536  NA 136 133* 132* 135 136  K 3.9 3.6 3.6 3.5 3.7  CL 96 96 92* 95* 95*  CO2 32 32 34* 32 34*  GLUCOSE 185* 95 79 83 102*  BUN 16 19 23 20 20   CREATININE 0.74 0.64 0.74 0.62 0.60  CALCIUM 9.8 9.4 9.3 9.7 9.6   Liver Function Tests:  Recent Labs Lab 06/19/13 0300 06/19/13 1540  AST 15 12  ALT 7 6  ALKPHOS 73 61  BILITOT 0.7 0.6  PROT 8.9* 7.9  ALBUMIN 3.7 3.1*   No results found for this basename: LIPASE, AMYLASE,  in the last 168 hours No results found for this basename: AMMONIA,  in the last 168 hours CBC:  Recent Labs Lab 06/19/13 0300 06/19/13 0318 06/19/13 1540 06/20/13 0519  WBC 8.4  --  7.0 8.3  NEUTROABS  --   --  5.6  --   HGB 13.6 15.6* 12.7 12.6  HCT 40.8 46.0 39.1 39.0  MCV 85.4  --  85.4 85.9  PLT 136*  --  141* 131*   Cardiac Enzymes:  Recent Labs Lab 06/19/13 0300 06/19/13 1540  TROPONINI <0.30 <0.30   BNP: BNP (last 3 results)  Recent Labs  09/12/12 1423 06/19/13 0300  PROBNP 653.9* 198.2*   CBG:  Recent Labs Lab 06/22/13 0733 06/22/13 1203 06/22/13 1743 06/22/13 2054 06/23/13 0811  GLUCAP 84 105* 112* 126* 116*    Time coordinating discharge: 45 minutes  Signed:  Litisha Guagliardo  [facility] 06/23/2013, 11:05 AM

## 2013-06-26 ENCOUNTER — Ambulatory Visit (INDEPENDENT_AMBULATORY_CARE_PROVIDER_SITE_OTHER): Payer: Medicare Other | Admitting: Family Medicine

## 2013-06-26 ENCOUNTER — Encounter: Payer: Self-pay | Admitting: Family Medicine

## 2013-06-26 VITALS — BP 117/58 | HR 64 | Temp 98.7°F | Ht 66.0 in | Wt 168.0 lb

## 2013-06-26 DIAGNOSIS — J9620 Acute and chronic respiratory failure, unspecified whether with hypoxia or hypercapnia: Secondary | ICD-10-CM

## 2013-06-26 DIAGNOSIS — R609 Edema, unspecified: Secondary | ICD-10-CM

## 2013-06-26 DIAGNOSIS — I5033 Acute on chronic diastolic (congestive) heart failure: Secondary | ICD-10-CM

## 2013-06-26 DIAGNOSIS — J449 Chronic obstructive pulmonary disease, unspecified: Secondary | ICD-10-CM

## 2013-06-26 DIAGNOSIS — I10 Essential (primary) hypertension: Secondary | ICD-10-CM

## 2013-06-26 DIAGNOSIS — E0590 Thyrotoxicosis, unspecified without thyrotoxic crisis or storm: Secondary | ICD-10-CM

## 2013-06-26 DIAGNOSIS — D649 Anemia, unspecified: Secondary | ICD-10-CM

## 2013-06-26 DIAGNOSIS — E119 Type 2 diabetes mellitus without complications: Secondary | ICD-10-CM

## 2013-06-26 DIAGNOSIS — R600 Localized edema: Secondary | ICD-10-CM

## 2013-06-26 DIAGNOSIS — E559 Vitamin D deficiency, unspecified: Secondary | ICD-10-CM

## 2013-06-26 LAB — POCT GLYCOSYLATED HEMOGLOBIN (HGB A1C): Hemoglobin A1C: 5.4

## 2013-06-26 MED ORDER — METOLAZONE 5 MG PO TABS: 5.0000 mg | ORAL_TABLET | Freq: Every day | ORAL | Status: DC

## 2013-06-26 NOTE — Progress Notes (Signed)
Patient ID: Sonya Oliver, female   DOB: 1941-02-07, 72 y.o.   MRN: [id number] SUBJECTIVE: CC: Chief Complaint  Patient presents with  . Medication Refill    3 month follow up /HTN  post hosp follow up     HPI: Post hospital follow up. Patient had Heart failure episode.doing great since hospitalization. Breathing good. Gained weight over the last couple of days. On discharge the furosemide was incraesed to twice  A day. No Chest pain. No orthopnea.  Past Medical History  Diagnosis Date  . Hypertension   . Thyroid disease HYPERTHYROIDISM--  NO LONGER ON MED. PT STATES BLOOD WORK BACK TO NORMAL  . 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 Arthritis   . H/O pulmonary edema JAN 2014  . Diabetes mellitus type II, controlled   . Dyspnea on exertion   . Nocturnal oxygen desaturation USES O2 HS VIA [place]  . 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 CHF (congestive heart failure) ACUTE CHF W/ PULM, EDEMA --  JAN 2014    NO CARDIOLOGIST---  MONITORED BY PCP DR Modesto Charon  . Nocturia   . COPD with emphysema    Past Surgical History  Procedure Laterality Date  . Cystoscopy  03/23/2012    Procedure: CYSTOSCOPY;  Surgeon: Garnett Farm, MD;  Location: [facility];  Service: Urology;  Laterality: N/A;  . Transurethral resection of bladder tumor  03/23/2012    Procedure: TRANSURETHRAL RESECTION OF BLADDER TUMOR (TURBT);  Surgeon: Garnett Farm, MD;  Location: [facility];  Service: Urology;  Laterality: N/A;  . Transthoracic echocardiogram  09-13-2012    MILDLY DILATED LV  &  RV/ LVSF NORMAL/ EF 55-60%/ MILD MR/ MILDLY DILATED LA  &  RA/ SYSTOLIC PRESSURE MODERATELY INCREASED PA  . Abdominal hysterectomy  1980'S    W/ BILATERAL SALPINGO-OOPHORECTOMY  . Transurethral resection of bladder tumor N/A 01/08/2013    Procedure: TRANSURETHRAL RESECTION OF BLADDER TUMOR (TURBT);  Surgeon: Garnett Farm, MD;  Location: [facility];  Service: Urology;  Laterality: N/A;   History   Social  History  . Marital Status: Widowed    Spouse Name: N/A    Number of Children: N/A  . Years of Education: N/A   Occupational History  . Not on file.   Social History Main Topics  . Smoking status: Former Smoker -- 1.00 packs/day for 50 years    Types: Cigarettes    Quit date: 09/24/2006  . Smokeless tobacco: Never Used  . Alcohol Use: No  . Drug Use: No  . Sexual Activity: No   Other Topics Concern  . Not on file   Social History Narrative   Occupation: worked in Radio broadcast assistant x33 yrs   Family History  Problem Relation Age of Onset  . Diabetes Mother   . Diabetes Father    Current Outpatient Prescriptions on File Prior to Visit  Medication Sig Dispense Refill  . carvedilol (COREG) 3.125 MG tablet TAKE 1 TABLET BY MOUTH TWICE DAILY  60 tablet  1  . Cholecalciferol (VITAMIN D3) 2000 UNITS TABS Take 1 tablet by mouth daily.      . furosemide (LASIX) 40 MG tablet Take 1 tablet (40 mg total) by mouth 2 (two) times daily.  60 tablet  0  . KLOR-CON M20 20 MEQ tablet Take 20 mEq by mouth daily.       . metFORMIN (GLUCOPHAGE) 500 MG tablet TAKE 1 TABLET BY  MOUTH TWICE DAILY  60 tablet  0  . capsaicin (ZOSTRIX) 0.025 % cream Apply topically 2 (two) times daily. Apply to feet  60 g  0   No current facility-administered medications on file prior to visit.   No Known Allergies Immunization History  Administered Date(s) Administered  . Influenza,inj,Quad PF,36+ Mos 06/20/2013  . Pneumococcal Polysaccharide 03/31/2011  . Tdap 03/31/2011  . Zoster 04/16/2013   Prior to Admission medications   Medication Sig Start Date End Date Taking? Authorizing Provider  carvedilol (COREG) 3.125 MG tablet TAKE 1 TABLET BY MOUTH TWICE DAILY 06/10/13  Yes Ileana Ladd, MD  Cholecalciferol (VITAMIN D3) 2000 UNITS TABS Take 1 tablet by mouth daily.   Yes Historical Provider, MD  furosemide (LASIX) 40 MG tablet Take 1 tablet (40 mg total) by mouth 2 (two) times daily. 06/23/13  Yes Renae Fickle, MD  KLOR-CON M20 20 MEQ tablet Take 20 mEq by mouth daily.  12/12/12  Yes Historical Provider, MD  metFORMIN (GLUCOPHAGE) 500 MG tablet TAKE 1 TABLET BY MOUTH TWICE DAILY 06/09/13  Yes Ileana Ladd, MD  capsaicin (ZOSTRIX) 0.025 % cream Apply topically 2 (two) times daily. Apply to feet 06/23/13   Renae Fickle, MD     ROS: As above in the HPI. All other systems are stable or negative.  OBJECTIVE: APPEARANCE:  Patient in no acute distress.The patient appeared well nourished and normally developed. Acyanotic. Waist: VITAL SIGNS:BP 117/58  Pulse 64  Temp(Src) 98.7 F (37.1 C) (Oral)  Ht 5\' 6"  (1.676 m)  Wt 168 lb (76.204 kg)  BMI 27.13 kg/m2 AAF  SKIN: warm and  Dry without overt rashes, tattoos and scars  HEAD and Neck: without JVD, Head and scalp: normal Eyes:No scleral icterus. Fundi normal, eye movements:left esotropia, no change Ears: Auricle normal, canal normal, Tympanic membranes normal, insufflation normal. Nose: normal Throat: normal Neck & thyroid: normal  CHEST & LUNGS: Chest wall: normal Lungs: Clear  CVS: Reveals the PMI to be normally located. Regular rhythm, First and Second Heart sounds are normal,  absence of murmurs, rubs or gallops.  ABDOMEN:  Appearance: normal Benign, no organomegaly, no masses, no Abdominal Aortic enlargement. No Guarding , no rebound. No Bruits. Bowel sounds: normal  RECTAL: N/A GU: N/A  EXTREMETIES: edema 1+ both lower extremities   NEUROLOGIC: oriented to time,place and person; nonfocal. Ambulatory with a walker  ASSESSMENT: Acute on chronic diastolic heart failure - Plan: CMP14+EGFR, Brain natriuretic peptide  Acute-on-chronic respiratory failure  Anemia  COPD (chronic obstructive pulmonary disease)  Diabetes - Plan: POCT glycosylated hemoglobin (Hb A1C)  Edema leg  HTN (hypertension)  Hyperthyroidism  Unspecified vitamin D deficiency Patient has gained significant weight since discharge.  And needs to adjustment to diuresis. Added zaroxylin to the regimen. Consider adding spironolactone to the regimen at follow up.  PLAN:  Orders Placed This Encounter  Procedures  . CMP14+EGFR  . Brain natriuretic peptide  . POCT glycosylated hemoglobin (Hb A1C)   Meds ordered this encounter  Medications  . DISCONTD: nitrofurantoin, macrocrystal-monohydrate, (MACROBID) 100 MG capsule    Sig:   . metolazone (ZAROXOLYN) 5 MG tablet    Sig: Take 1 tablet (5 mg total) by mouth daily. Prn 5 lb weight gain    Dispense:  30 tablet    Refill:  0   Medications Discontinued During This Encounter  Medication Reason  . nitrofurantoin, macrocrystal-monohydrate, (MACROBID) 100 MG capsule Completed Course  restrict salt intake.  Return in about 3 days (around  06/29/2013) for Recheck medical problems, recheck BP.  Dyonna Jaspers P. Modesto Charon, M.D.

## 2013-06-27 LAB — CMP14+EGFR
ALT: 15 IU/L (ref 0–32)
AST: 19 IU/L (ref 0–40)
Albumin/Globulin Ratio: 1 — ABNORMAL LOW (ref 1.1–2.5)
Albumin: 4 g/dL (ref 3.5–4.8)
Alkaline Phosphatase: 65 IU/L (ref 39–117)
BUN/Creatinine Ratio: 26 (ref 11–26)
BUN: 20 mg/dL (ref 8–27)
CO2: 30 mmol/L — ABNORMAL HIGH (ref 18–29)
Calcium: 9.8 mg/dL (ref 8.6–10.2)
Chloride: 98 mmol/L (ref 97–108)
Creatinine, Ser: 0.78 mg/dL (ref 0.57–1.00)
GFR calc Af Amer: 87 mL/min/{1.73_m2} (ref >59)
GFR calc non Af Amer: 76 mL/min/{1.73_m2} (ref >59)
Globulin, Total: 4.2 g/dL (ref 1.5–4.5)
Glucose: 97 mg/dL (ref 65–99)
Potassium: 4.6 mmol/L (ref 3.5–5.2)
Sodium: 141 mmol/L (ref 134–144)
Total Bilirubin: 0.6 mg/dL (ref 0.0–1.2)
Total Protein: 8.2 g/dL (ref 6.0–8.5)

## 2013-06-27 LAB — BRAIN NATRIURETIC PEPTIDE: BNP: 33.5 pg/mL (ref 0.0–100.0)

## 2013-06-28 ENCOUNTER — Other Ambulatory Visit (HOSPITAL_COMMUNITY): Payer: Self-pay | Admitting: Endocrinology

## 2013-06-28 DIAGNOSIS — E0590 Thyrotoxicosis, unspecified without thyrotoxic crisis or storm: Secondary | ICD-10-CM

## 2013-06-29 ENCOUNTER — Encounter: Payer: Medicare Other | Admitting: *Deleted

## 2013-07-11 ENCOUNTER — Encounter (HOSPITAL_COMMUNITY)
Admission: RE | Admit: 2013-07-11 | Discharge: 2013-07-11 | Disposition: A | Discharge status: Home / Self Care | Payer: Medicare Other | Source: Ambulatory Visit | Attending: Endocrinology | Admitting: Endocrinology

## 2013-07-11 DIAGNOSIS — E0590 Thyrotoxicosis, unspecified without thyrotoxic crisis or storm: Secondary | ICD-10-CM

## 2013-07-12 ENCOUNTER — Encounter (HOSPITAL_COMMUNITY)
Admission: RE | Admit: 2013-07-12 | Discharge: 2013-07-12 | Disposition: A | Discharge status: Home / Self Care | Payer: Medicare Other | Source: Ambulatory Visit | Attending: Endocrinology | Admitting: Endocrinology

## 2013-07-12 ENCOUNTER — Other Ambulatory Visit (HOSPITAL_COMMUNITY): Payer: Self-pay | Admitting: Endocrinology

## 2013-07-12 DIAGNOSIS — E0590 Thyrotoxicosis, unspecified without thyrotoxic crisis or storm: Secondary | ICD-10-CM

## 2013-07-12 MED ORDER — SODIUM IODIDE I 131 CAPSULE: 13.0000 | Freq: Once | INTRAVENOUS | Status: AC | PRN

## 2013-07-12 MED ORDER — SODIUM PERTECHNETATE TC 99M INJECTION: 10.0000 | Freq: Once | INTRAVENOUS | Status: AC | PRN

## 2013-07-18 ENCOUNTER — Telehealth: Payer: Self-pay | Admitting: Family Medicine

## 2013-07-19 NOTE — Telephone Encounter (Signed)
i know nothing about this

## 2013-07-20 NOTE — Telephone Encounter (Signed)
Spoke with Raynelle Fanning and informed her Dr Modesto Charon reduced the metformin to once daily and per notes should cont the metalozone (zarolyn) for weight gain. Called and left message on [place] phone and per Raynelle Fanning she will call pt as well.

## 2013-08-03 ENCOUNTER — Telehealth: Payer: Self-pay | Admitting: Family Medicine

## 2013-08-03 NOTE — Telephone Encounter (Signed)
appt Monday at 10:40 with Modesto Charon

## 2013-08-06 ENCOUNTER — Telehealth: Payer: Self-pay | Admitting: Family Medicine

## 2013-08-06 ENCOUNTER — Encounter: Payer: Self-pay | Admitting: Family Medicine

## 2013-08-06 ENCOUNTER — Ambulatory Visit (INDEPENDENT_AMBULATORY_CARE_PROVIDER_SITE_OTHER): Payer: Medicare Other | Admitting: Family Medicine

## 2013-08-06 VITALS — BP 111/60 | HR 66 | Temp 97.9°F | Ht 66.0 in | Wt 156.2 lb

## 2013-08-06 DIAGNOSIS — I951 Orthostatic hypotension: Secondary | ICD-10-CM

## 2013-08-06 DIAGNOSIS — I5033 Acute on chronic diastolic (congestive) heart failure: Secondary | ICD-10-CM

## 2013-08-06 DIAGNOSIS — R600 Localized edema: Secondary | ICD-10-CM

## 2013-08-06 DIAGNOSIS — I10 Essential (primary) hypertension: Secondary | ICD-10-CM

## 2013-08-06 DIAGNOSIS — E0590 Thyrotoxicosis, unspecified without thyrotoxic crisis or storm: Secondary | ICD-10-CM

## 2013-08-06 DIAGNOSIS — E559 Vitamin D deficiency, unspecified: Secondary | ICD-10-CM

## 2013-08-06 DIAGNOSIS — D62 Acute posthemorrhagic anemia: Secondary | ICD-10-CM

## 2013-08-06 DIAGNOSIS — N3289 Other specified disorders of bladder: Secondary | ICD-10-CM

## 2013-08-06 DIAGNOSIS — R609 Edema, unspecified: Secondary | ICD-10-CM

## 2013-08-06 DIAGNOSIS — E119 Type 2 diabetes mellitus without complications: Secondary | ICD-10-CM

## 2013-08-06 DIAGNOSIS — J449 Chronic obstructive pulmonary disease, unspecified: Secondary | ICD-10-CM

## 2013-08-06 DIAGNOSIS — J9620 Acute and chronic respiratory failure, unspecified whether with hypoxia or hypercapnia: Secondary | ICD-10-CM

## 2013-08-06 NOTE — Progress Notes (Signed)
Patient ID: Sonya Oliver, female   DOB: 03-27-1941, 72 y.o.   MRN: [id number] SUBJECTIVE: CC: Chief Complaint  Patient presents with  . Acute Visit    states on Friday home nurse told her BP too low .states cant taste or smell . c/o unstesdy on feet staggers .   [name] Medication Refill    HPI:  Patient is here for follow up of hypertension:has mainly been hypotensive and gets extremely dizzy standing. The reduction in the beta blocker has helped but still is a little dizzy still. denies Headache;deniesChest Pain;denies weakness;denies Shortness of Breath or Orthopnea;denies Visual changes;denies palpitations;denies cough;denies pedal edema;denies symptoms of TIA or stroke; admits to Compliance with medications. denies Problems with medications.    Past Medical History  Diagnosis Date  . Hypertension   . Thyroid disease HYPERTHYROIDISM--  NO LONGER ON MED. PT STATES BLOOD WORK BACK TO NORMAL  . 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 Arthritis   . H/O pulmonary edema JAN 2014  . Diabetes mellitus type II, controlled   . Dyspnea on exertion   . Nocturnal oxygen desaturation USES O2 HS VIA [place]  . 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 CHF (congestive heart failure) ACUTE CHF W/ PULM, EDEMA --  JAN 2014    NO CARDIOLOGIST---  MONITORED BY PCP DR Modesto Charon  . Nocturia   . COPD with emphysema    Past Surgical History  Procedure Laterality Date  . Cystoscopy  03/23/2012    Procedure: CYSTOSCOPY;  Surgeon: Garnett Farm, MD;  Location: [facility];  Service: Urology;  Laterality: N/A;  . Transurethral resection of bladder tumor  03/23/2012    Procedure: TRANSURETHRAL RESECTION OF BLADDER TUMOR (TURBT);  Surgeon: Garnett Farm, MD;  Location: [facility];  Service: Urology;  Laterality: N/A;  . Transthoracic echocardiogram  09-13-2012    MILDLY DILATED LV  &  RV/ LVSF NORMAL/ EF 55-60%/ MILD MR/ MILDLY DILATED LA  &  RA/ SYSTOLIC PRESSURE MODERATELY INCREASED PA  . Abdominal  hysterectomy  1980'S    W/ BILATERAL SALPINGO-OOPHORECTOMY  . Transurethral resection of bladder tumor N/A 01/08/2013    Procedure: TRANSURETHRAL RESECTION OF BLADDER TUMOR (TURBT);  Surgeon: Garnett Farm, MD;  Location[facility];  Service: Urology;  Laterality: N/A;   History   Social History  . Marital Status: Widowed    Spouse Name: N/A    Number of Children: N/A  . Years of Education: N/A   Occupational History  . Not on file.   Social History Main Topics  . Smoking status: Former Smoker -- 1.00 packs/day for 50 years    Types: Cigarettes    Quit date: 09/24/2006  . Smokeless tobacco: Never Used  . Alcohol Use: No  . Drug Use: No  . Sexual Activity: No   Other Topics Concern  . Not on file   Social History Narrative   Occupation: worked in Radio broadcast assistant x33 yrs   Family History  Problem Relation Age of Onset  . Diabetes Mother   . Diabetes Father    Current Outpatient Prescriptions on File Prior to Visit  Medication Sig Dispense Refill  . capsaicin (ZOSTRIX) 0.025 % cream Apply topically 2 (two) times daily. Apply to feet  60 g  0  . Cholecalciferol (VITAMIN D3) 2000 UNITS TABS Take 1 tablet by mouth daily.      [name] KLOR-CON M20 20 MEQ tablet Take 20 mEq  by mouth daily.       . metolazone (ZAROXOLYN) 5 MG tablet Take 1 tablet (5 mg total) by mouth daily. Prn 5 lb weight gain  30 tablet  0   No current facility-administered medications on file prior to visit.   No Known Allergies Immunization History  Administered Date(s) Administered  . Influenza,inj,Quad PF,36+ Mos 06/20/2013  . Pneumococcal Polysaccharide-23 03/31/2011  . Tdap 03/31/2011  . Zoster 04/16/2013   Prior to Admission medications   Medication Sig Start Date End Date Taking? Authorizing Provider  capsaicin (ZOSTRIX) 0.025 % cream Apply topically 2 (two) times daily. Apply to feet 06/23/13   Renae Fickle, MD  carvedilol (COREG) 3.125 MG tablet TAKE 1 TABLET BY MOUTH TWICE  DAILY 06/10/13   Ileana Ladd, MD  Cholecalciferol (VITAMIN D3) 2000 UNITS TABS Take 1 tablet by mouth daily.    Historical Provider, MD  furosemide (LASIX) 40 MG tablet Take 1 tablet (40 mg total) by mouth 2 (two) times daily. 06/23/13   Renae Fickle, MD  KLOR-CON M20 20 MEQ tablet Take 20 mEq by mouth daily.  12/12/12   Historical Provider, MD  metFORMIN (GLUCOPHAGE) 500 MG tablet TAKE 1 TABLET BY MOUTH TWICE DAILY 06/09/13   Ileana Ladd, MD  metolazone (ZAROXOLYN) 5 MG tablet Take 1 tablet (5 mg total) by mouth daily. Prn 5 lb weight gain 06/26/13   Ileana Ladd, MD     ROS: As above in the HPI. All other systems are stable or negative.  OBJECTIVE: APPEARANCE:  Patient in no acute distress.The patient appeared well nourished and normally developed. Acyanotic. Waist: VITAL SIGNS:BP 111/60  Pulse 66  Temp(Src) 97.9 F (36.6 C) (Oral)  Ht 5\' 6"  (1.676 m)  Wt 156 lb 3.2 oz (70.852 kg)  BMI 25.22 kg/m2 AAF  SKIN: warm and  Dry without overt rashes, tattoos and scars  HEAD and Neck: without JVD, Head and scalp: normal Eyes:No scleral icterus. Fundi normal, eye movements left esotropia. No changes. Ears: Auricle normal, canal normal, Tympanic membranes normal, insufflation normal. Nose: normal Throat: normal Neck & thyroid: normal  CHEST & LUNGS: Chest wall: normal Lungs: Clear  CVS: Reveals the PMI to be normally located. Regular rhythm, First and Second Heart sounds are normal,  absence of murmurs, rubs or gallops.  ABDOMEN:  Appearance: normal Benign, no organomegaly, no masses, no Abdominal Aortic enlargement. No Guarding , no rebound. No Bruits. Bowel sounds: normal  RECTAL: N/A GU: N/A  EXTREMETIES: 2+ edematous.  NEUROLOGIC: oriented to time,place and person; nonfocal.  Results for orders placed in visit on 06/26/13  CMP14+EGFR      Result Value Range   Glucose 97  65 - 99 mg/dL   BUN 20  8 - 27 mg/dL   Creatinine, Ser 5.78  0.57 - 1.00 mg/dL    GFR calc non Af Amer 76  >59 mL/min/1.73   GFR calc Af Amer 87  >59 mL/min/1.73   BUN/Creatinine Ratio 26  11 - 26   Sodium 141  134 - 144 mmol/L   Potassium 4.6  3.5 - 5.2 mmol/L   Chloride 98  97 - 108 mmol/L   CO2 30 (*) 18 - 29 mmol/L   Calcium 9.8  8.6 - 10.2 mg/dL   Total Protein 8.2  6.0 - 8.5 g/dL   Albumin 4.0  3.5 - 4.8 g/dL   Globulin, Total 4.2  1.5 - 4.5 g/dL   Albumin/Globulin Ratio 1.0 (*) 1.1 - 2.5   Total  Bilirubin 0.6  0.0 - 1.2 mg/dL   Alkaline Phosphatase 65  39 - 117 IU/L   AST 19  0 - 40 IU/L   ALT 15  0 - 32 IU/L  BRAIN NATRIURETIC PEPTIDE      Result Value Range   BNP 33.5  0.0 - 100.0 pg/mL  POCT GLYCOSYLATED HEMOGLOBIN (HGB A1C)      Result Value Range   Hemoglobin A1C 5.4%      ASSESSMENT:   Orthostatic hypotension  Acute on chronic diastolic heart failure - Plan: Brain natriuretic peptide, CMP14+EGFR, furosemide (LASIX) 40 MG tablet  HTN (hypertension)  Unspecified vitamin D deficiency - Plan: Vit D  25 hydroxy (rtn osteoporosis monitoring)  Hyperthyroidism  Edema leg  COPD (chronic obstructive pulmonary disease)  Bladder mass  Anemia associated with acute blood loss - Plan: Anemia Profile B  Acute-on-chronic respiratory failure  Diabetes  PLAN:  Orders Placed This Encounter  Procedures  . Brain natriuretic peptide  . CMP14+EGFR  . Anemia Profile B  . Vit D  25 hydroxy (rtn osteoporosis monitoring)   Meds ordered this encounter  Medications  . carvedilol (COREG) 3.125 MG tablet    Sig: TAKE 1/2 TABLET BY MOUTH TWICE DAILY  . metFORMIN (GLUCOPHAGE) 500 MG tablet    Sig: TAKE 1 TABLET BY MOUTH  DAILY  . furosemide (LASIX) 40 MG tablet    Sig: Take 0.5 tablets (20 mg total) by mouth 2 (two) times daily.    Dispense:  60 tablet    Refill:  0   Medications Discontinued During This Encounter  Medication Reason  . carvedilol (COREG) 3.125 MG tablet   . metFORMIN (GLUCOPHAGE) 500 MG tablet   . furosemide (LASIX) 40 MG  tablet Reorder   Return in about 2 weeks (around 08/20/2013) for Recheck medical problems. and BP Reduced the lasix to see if this will help find the right balance between perfusion and treatment for Heart failure.  Luisantonio Adinolfi P. Modesto Charon, M.D.

## 2013-08-07 ENCOUNTER — Other Ambulatory Visit: Payer: Self-pay | Admitting: Family Medicine

## 2013-08-07 DIAGNOSIS — E119 Type 2 diabetes mellitus without complications: Secondary | ICD-10-CM

## 2013-08-07 LAB — CMP14+EGFR
ALT: 7 IU/L (ref 0–32)
AST: 14 IU/L (ref 0–40)
Albumin/Globulin Ratio: 1.2 (ref 1.1–2.5)
Albumin: 4.1 g/dL (ref 3.5–4.8)
Alkaline Phosphatase: 48 IU/L (ref 39–117)
BUN/Creatinine Ratio: 29 — ABNORMAL HIGH (ref 11–26)
BUN: 25 mg/dL (ref 8–27)
CO2: 34 mmol/L — ABNORMAL HIGH (ref 18–29)
Calcium: 9.6 mg/dL (ref 8.6–10.2)
Chloride: 92 mmol/L — ABNORMAL LOW (ref 97–108)
Creatinine, Ser: 0.86 mg/dL (ref 0.57–1.00)
GFR calc Af Amer: 77 mL/min/{1.73_m2} (ref >59)
GFR calc non Af Amer: 67 mL/min/{1.73_m2} (ref >59)
Globulin, Total: 3.4 g/dL (ref 1.5–4.5)
Glucose: 82 mg/dL (ref 65–99)
Potassium: 3.3 mmol/L — ABNORMAL LOW (ref 3.5–5.2)
Sodium: 141 mmol/L (ref 134–144)
Total Bilirubin: 0.8 mg/dL (ref 0.0–1.2)
Total Protein: 7.5 g/dL (ref 6.0–8.5)

## 2013-08-07 LAB — ANEMIA PROFILE B
Basophils Absolute: 0 10*3/uL (ref 0.0–0.2)
Basos: 0 %
Eos: 2 %
Eosinophils Absolute: 0.1 10*3/uL (ref 0.0–0.4)
Ferritin: 98 ng/mL (ref 15–150)
Folate: 11.4 ng/mL (ref >3.0)
HCT: 39.5 % (ref 34.0–46.6)
Hemoglobin: 13.6 g/dL (ref 11.1–15.9)
Immature Grans (Abs): 0 10*3/uL (ref 0.0–0.1)
Immature Granulocytes: 0 %
Iron Saturation: 22 % (ref 15–55)
Iron: 57 ug/dL (ref 35–155)
Lymphocytes Absolute: 1.7 10*3/uL (ref 0.7–3.1)
Lymphs: 38 %
MCH: 28.4 pg (ref 26.6–33.0)
MCHC: 34.4 g/dL (ref 31.5–35.7)
MCV: 83 fL (ref 79–97)
Monocytes Absolute: 0.5 10*3/uL (ref 0.1–0.9)
Monocytes: 10 %
Neutrophils Absolute: 2.2 10*3/uL (ref 1.4–7.0)
Neutrophils Relative %: 50 %
Platelets: 168 10*3/uL (ref 150–379)
RBC: 4.79 x10E6/uL (ref 3.77–5.28)
RDW: 13.5 % (ref 12.3–15.4)
Retic Ct Pct: 0.9 % (ref 0.6–2.6)
TIBC: 258 ug/dL (ref 250–450)
UIBC: 201 ug/dL (ref 150–375)
Vitamin B-12: 329 pg/mL (ref 211–946)
WBC: 4.5 10*3/uL (ref 3.4–10.8)

## 2013-08-07 LAB — BRAIN NATRIURETIC PEPTIDE: BNP: 58.5 pg/mL (ref 0.0–100.0)

## 2013-08-07 LAB — SPECIMEN STATUS REPORT

## 2013-08-07 LAB — VITAMIN D 25 HYDROXY (VIT D DEFICIENCY, FRACTURES): Vit D, 25-Hydroxy: 25 ng/mL — ABNORMAL LOW (ref 30.0–100.0)

## 2013-08-07 MED ORDER — METFORMIN HCL 500 MG PO TABS
500.0000 mg | ORAL_TABLET | Freq: Two times a day (BID) | ORAL | Status: DC
Start: 2013-08-07 — End: 2013-09-04

## 2013-08-07 NOTE — Telephone Encounter (Signed)
Pt notified and she will contac [facility] other refills

## 2013-08-08 ENCOUNTER — Other Ambulatory Visit: Payer: Self-pay | Admitting: Family Medicine

## 2013-08-08 DIAGNOSIS — E876 Hypokalemia: Secondary | ICD-10-CM

## 2013-08-08 NOTE — Progress Notes (Signed)
Quick Note:  Call Patient Labs that are abnormal: Vitamin D is low Potassium is low   The rest are at goal  Recommendations: Double the Vitamin D Dose she is taking Take a potassium tablet twice a day.(double the dose).  Recheck the potassium in 2 weeks.ordered in EPIC   ______

## 2013-08-16 ENCOUNTER — Other Ambulatory Visit: Payer: Self-pay | Admitting: Family Medicine

## 2013-08-16 ENCOUNTER — Telehealth: Payer: Self-pay

## 2013-08-16 NOTE — Telephone Encounter (Signed)
Patient gained 4 lbs from yesterday  162 to 166 lbs.  Has an appt with you 08/22/13  Do you want to see sooner?    Do you think she would benefit from Home Health?

## 2013-08-20 NOTE — Telephone Encounter (Signed)
Call patient : Prescription refilled & sent to pharmacy in EP[facility]. 

## 2013-08-21 NOTE — Telephone Encounter (Signed)
Pt aware rx at pharmacy.

## 2013-08-21 NOTE — Telephone Encounter (Signed)
Will see tomorrow

## 2013-08-22 ENCOUNTER — Encounter: Payer: Self-pay | Admitting: Family Medicine

## 2013-08-22 ENCOUNTER — Ambulatory Visit (INDEPENDENT_AMBULATORY_CARE_PROVIDER_SITE_OTHER): Payer: Medicare Other | Admitting: Family Medicine

## 2013-08-22 VITALS — BP 136/66 | HR 70 | Temp 97.8°F | Ht 66.0 in | Wt 161.0 lb

## 2013-08-22 DIAGNOSIS — R600 Localized edema: Secondary | ICD-10-CM

## 2013-08-22 DIAGNOSIS — N3289 Other specified disorders of bladder: Secondary | ICD-10-CM

## 2013-08-22 DIAGNOSIS — D62 Acute posthemorrhagic anemia: Secondary | ICD-10-CM

## 2013-08-22 DIAGNOSIS — I10 Essential (primary) hypertension: Secondary | ICD-10-CM

## 2013-08-22 DIAGNOSIS — E559 Vitamin D deficiency, unspecified: Secondary | ICD-10-CM

## 2013-08-22 DIAGNOSIS — E0590 Thyrotoxicosis, unspecified without thyrotoxic crisis or storm: Secondary | ICD-10-CM

## 2013-08-22 DIAGNOSIS — E876 Hypokalemia: Secondary | ICD-10-CM | POA: Insufficient documentation

## 2013-08-22 DIAGNOSIS — R609 Edema, unspecified: Secondary | ICD-10-CM

## 2013-08-22 DIAGNOSIS — E119 Type 2 diabetes mellitus without complications: Secondary | ICD-10-CM

## 2013-08-22 DIAGNOSIS — I5033 Acute on chronic diastolic (congestive) heart failure: Secondary | ICD-10-CM

## 2013-08-22 DIAGNOSIS — J449 Chronic obstructive pulmonary disease, unspecified: Secondary | ICD-10-CM

## 2013-08-22 DIAGNOSIS — J9620 Acute and chronic respiratory failure, unspecified whether with hypoxia or hypercapnia: Secondary | ICD-10-CM

## 2013-08-22 DIAGNOSIS — I951 Orthostatic hypotension: Secondary | ICD-10-CM

## 2013-08-22 NOTE — Progress Notes (Signed)
Patient ID: Sonya Oliver, female   DOB: [birth year], 72 y.o.   MRN: [id number] SUBJECTIVE: CC: Chief Complaint  Patient presents with  . Follow-up    2 week follow up  "little better " still staggers "some" cant miove fast . still cant taste foods even chocolate     HPI: Doing well. Came for follow up of her medical problems especially on the heart failure. We adjusted her diuretics to correct the hypotensive symptoms and improve the perfusion vs diuretic effect for the heart failure.  Patient is here for follow up of Diabetes Mellitus: Symptoms evaluated: Denies Nocturia ,Denies Urinary Frequency , denies Blurred vision ,deniesDizziness,denies.Dysuria,denies paresthesias, denies extremity pain or ulcers.Marland Kitchendenies chest pain. has had an annual eye exam. do check the feet. Does check CBGs. Average VHQ:IONGEX Denies episodes of hypoglycemia. Does have an emergency hypoglycemic plan. admits toCompliance with medications. Denies Problems with medications.  Past Medical History  Diagnosis Date  . Hypertension   . Thyroid disease HYPERTHYROIDISM--  NO LONGER ON MED. PT STATES BLOOD WORK BACK TO NORMAL  . 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 Arthritis   . H/O pulmonary edema JAN 2014  . Diabetes mellitus type II, controlled   . Dyspnea on exertion   . Nocturnal oxygen desaturation USES O2 HS VIA [place]  . 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 CHF (congestive heart failure) ACUTE CHF W/ PULM, EDEMA --  JAN 2014    NO CARDIOLOGIST---  MONITORED BY PCP DR Modesto Charon  . Nocturia   . COPD with emphysema    Past Surgical History  Procedure Laterality Date  . Cystoscopy  03/23/2012    Procedure: CYSTOSCOPY;  Surgeon: Garnett Farm, MD;  Location: [facility];  Service: Urology;  Laterality: N/A;  . Transurethral resection of bladder tumor  03/23/2012    Procedure: TRANSURETHRAL RESECTION OF BLADDER TUMOR (TURBT);  Surgeon: Garnett Farm, MD;  Location: [facility];  Service: Urology;   Laterality: N/A;  . Transthoracic echocardiogram  09-13-2012    MILDLY DILATED LV  &  RV/ LVSF NORMAL/ EF 55-60%/ MILD MR/ MILDLY DILATED LA  &  RA/ SYSTOLIC PRESSURE MODERATELY INCREASED PA  . Abdominal hysterectomy  1980'S    W/ BILATERAL SALPINGO-OOPHORECTOMY  . Transurethral resection of bladder tumor N/A 01/08/2013    Procedure: TRANSURETHRAL RESECTION OF BLADDER TUMOR (TURBT);  Surgeon: Garnett Farm, MD;  Location: [facility];  Service: Urology;  Laterality: N/A;   History   Social History  . Marital Status: Widowed    Spouse Name: N/A    Number of Children: N/A  . Years of Education: N/A   Occupational History  . Not on file.   Social History Main Topics  . Smoking status: Former Smoker -- 1.00 packs/day for 50 years    Types: Cigarettes    Quit date: 09/24/2006  . Smokeless tobacco: Never Used  . Alcohol Use: No  . Drug Use: No  . Sexual Activity: No   Other Topics Concern  . Not on file   Social History Narrative   Occupation: worked in Radio broadcast assistant x33 yrs   Family History  Problem Relation Age of Onset  . Diabetes Mother   . Diabetes Father    Current Outpatient Prescriptions on File Prior to Visit  Medication Sig Dispense Refill  . capsaicin (ZOSTRIX) 0.025 % cream Apply topically 2 (two) times daily. Apply to feet  60 g  0  . carvedilol (COREG) 3.125 MG tablet TAKE 1/2 TABLET BY MOUTH TWICE DAILY      . Cholecalciferol (VITAMIN D3) 2000 UNITS TABS Take 1 tablet by mouth daily.      . furosemide (LASIX) 40 MG tablet Take 0.5 tablets (20 mg total) by mouth 2 (two) times daily.  60 tablet  0  . KLOR-CON M20 20 MEQ tablet Take 20 mEq by mouth daily.       . metFORMIN (GLUCOPHAGE) 500 MG tablet TAKE 1 TABLET BY MOUTH TWICE DAILY  60 tablet  2  . metFORMIN (GLUCOPHAGE) 500 MG tablet Take 1 tablet (500 mg total) by mouth 2 (two) times daily with a meal.  60 tablet  11  . metolazone (ZAROXOLYN) 5 MG tablet TAKE 1 TABLET (5 MG TOTAL) BY MOUTH  DAILY. AS NEEDED 5 LB WEIGHT GAIN  30 tablet  0   No current facility-administered medications on file prior to visit.   No Known Allergies Immunization History  Administered Date(s) Administered  . Influenza,inj,Quad PF,36+ Mos 06/20/2013  . Pneumococcal Polysaccharide-23 03/31/2011  . Tdap 03/31/2011  . Zoster 04/16/2013   Prior to Admission medications   Medication Sig Start Date End Date Taking? Authorizing Provider  capsaicin (ZOSTRIX) 0.025 % cream Apply topically 2 (two) times daily. Apply to feet 06/23/13   Renae Fickle, MD  carvedilol (COREG) 3.125 MG tablet TAKE 1/2 TABLET BY MOUTH TWICE DAILY 06/10/13   Ileana Ladd, MD  Cholecalciferol (VITAMIN D3) 2000 UNITS TABS Take 1 tablet by mouth daily.    Historical Provider, MD  furosemide (LASIX) 40 MG tablet Take 0.5 tablets (20 mg total) by mouth 2 (two) times daily. 08/06/13   Ileana Ladd, MD  KLOR-CON M20 20 MEQ tablet Take 20 mEq by mouth daily.  12/12/12   Historical Provider, MD  metFORMIN (GLUCOPHAGE) 500 MG tablet TAKE 1 TABLET BY MOUTH TWICE DAILY 08/07/13   Ileana Ladd, MD  metFORMIN (GLUCOPHAGE) 500 MG tablet Take 1 tablet (500 mg total) by mouth 2 (two) times daily with a meal. 08/07/13   Ileana Ladd, MD  metolazone (ZAROXOLYN) 5 MG tablet TAKE 1 TABLET (5 MG TOTAL) BY MOUTH DAILY. AS NEEDED 5 LB WEIGHT GAIN 08/16/13   Ileana Ladd, MD     ROS: As above in the HPI. All other systems are stable or negative.  OBJECTIVE: APPEARANCE:  Patient in no acute distress.The patient appeared well nourished and normally developed. Acyanotic. Waist: VITAL SIGNS:BP 136/66  Pulse 70  Temp(Src) 97.8 F (36.6 C) (Oral)  Ht 5\' 6"  (1.676 m)  Wt 161 lb (73.029 kg)  BMI 26.00 kg/m2 AAF  SKIN: warm and  Dry without overt rashes, tattoos and scars  HEAD and Neck: without JVD, Head and scalp: normal Eyes:No scleral icterus. Fundi normal, eye movements normal.left esotropia. Ears: Auricle normal, canal normal,  Tympanic membranes normal, insufflation normal. Nose: normal Throat: normal Neck & thyroid: normal  CHEST & LUNGS: Chest wall: normal Lungs: Clear  CVS: Reveals the PMI to be normally located. Regular rhythm, First and Second Heart sounds are normal,  absence of murmurs, rubs or gallops. Peripheral vasculature: Radial pulses: normal Dorsal pedis pulses: normal Posterior pulses: normal  ABDOMEN:  Appearance: normal Benign, no organomegaly, no masses, no Abdominal Aortic enlargement. No Guarding , no rebound. No Bruits. Bowel sounds: normal  RECTAL: N/A GU: N/A  EXTREMETIES: nonedematous.  MUSCULOSKELETAL:  Spine: normal Joints: intact  NEUROLOGIC: oriented to time,place and person; nonfocal. Results  for orders placed in visit on 08/06/13  BRAIN NATRIURETIC PEPTIDE      Result Value Range   BNP 58.5  0.0 - 100.0 pg/mL  CMP14+EGFR      Result Value Range   Glucose 82  65 - 99 mg/dL   BUN 25  8 - 27 mg/dL   Creatinine, Ser 7.82  0.57 - 1.00 mg/dL   GFR calc non Af Amer 67  >59 mL/min/1.73   GFR calc Af Amer 77  >59 mL/min/1.73   BUN/Creatinine Ratio 29 (*) 11 - 26   Sodium 141  134 - 144 mmol/L   Potassium 3.3 (*) 3.5 - 5.2 mmol/L   Chloride 92 (*) 97 - 108 mmol/L   CO2 34 (*) 18 - 29 mmol/L   Calcium 9.6  8.6 - 10.2 mg/dL   Total Protein 7.5  6.0 - 8.5 g/dL   Albumin 4.1  3.5 - 4.8 g/dL   Globulin, Total 3.4  1.5 - 4.5 g/dL   Albumin/Globulin Ratio 1.2  1.1 - 2.5   Total Bilirubin 0.8  0.0 - 1.2 mg/dL   Alkaline Phosphatase 48  39 - 117 IU/L   AST 14  0 - 40 IU/L   ALT 7  0 - 32 IU/L  ANEMIA PROFILE B      Result Value Range   TIBC 258  250 - 450 ug/dL   UIBC 956  213 - 086 ug/dL   Iron 57  35 - 578 ug/dL   Iron Saturation 22  15 - 55 %   Ferritin 98  15 - 150 ng/mL   Vitamin B-12 329  211 - 946 pg/mL   Folate 11.4  >3.0 ng/mL   WBC 4.5  3.4 - 10.8 x10E3/uL   RBC 4.79  3.77 - 5.28 x10E6/uL   Hemoglobin 13.6  11.1 - 15.9 g/dL   HCT 46.9  62.9 - 52.8  %   MCV 83  79 - 97 fL   MCH 28.4  26.6 - 33.0 pg   MCHC 34.4  31.5 - 35.7 g/dL   RDW 41.3  24.4 - 01.0 %   Platelets 168  150 - 379 x10E3/uL   Neutrophils Relative % 50     Lymphs 38     Monocytes 10     Eos 2     Basos 0     Neutrophils Absolute 2.2  1.4 - 7.0 x10E3/uL   Lymphocytes Absolute 1.7  0.7 - 3.1 x10E3/uL   Monocytes Absolute 0.5  0.1 - 0.9 x10E3/uL   Eosinophils Absolute 0.1  0.0 - 0.4 x10E3/uL   Basophils Absolute 0.0  0.0 - 0.2 x10E3/uL   Immature Granulocytes 0     Immature Grans (Abs) 0.0  0.0 - 0.1 x10E3/uL   Retic Ct Pct 0.9  0.6 - 2.6 %  VITAMIN D 25 HYDROXY      Result Value Range   Vit D, 25-Hydroxy 25.0 (*) 30.0 - 100.0 ng/mL  SPECIMEN STATUS REPORT      Result Value Range   specimen status report Comment      ASSESSMENT: Acute on chronic diastolic heart failure  Acute-on-chronic respiratory failure  Anemia associated with acute blood loss  Bladder mass  COPD (chronic obstructive pulmonary disease)  Diabetes  Edema leg  Hyperthyroidism  HTN (hypertension)  Orthostatic hypotension  Unspecified vitamin D deficiency  Hypokalemia - Plan: BMP8+EGFR  PLAN: Continue present medications. Reviewed labs with patient.  No orders of the defined types were placed  in this encounter.   No orders of the defined types were placed in this encounter.   There are no discontinued medications. Return in about 6 weeks (around 10/03/2013) for Recheck medical problems.  Shina Wass P. Modesto Charon, M.D.

## 2013-08-23 LAB — BMP8+EGFR
BUN/Creatinine Ratio: 21 (ref 11–26)
BUN: 19 mg/dL (ref 8–27)
CO2: 29 mmol/L (ref 18–29)
Calcium: 10.1 mg/dL (ref 8.6–10.2)
Chloride: 98 mmol/L (ref 97–108)
Creatinine, Ser: 0.9 mg/dL (ref 0.57–1.00)
GFR calc Af Amer: 73 mL/min/{1.73_m2} (ref >59)
GFR calc non Af Amer: 63 mL/min/{1.73_m2} (ref >59)
Glucose: 90 mg/dL (ref 65–99)
Potassium: 5.2 mmol/L (ref 3.5–5.2)
Sodium: 144 mmol/L (ref 134–144)

## 2013-08-27 NOTE — Progress Notes (Signed)
Quick Note:  Call patient. Labs normal. No change in plan. ______ 

## 2013-08-30 DIAGNOSIS — C679 Malignant neoplasm of bladder, unspecified: Secondary | ICD-10-CM

## 2013-08-30 HISTORY — DX: Malignant neoplasm of bladder, unspecified: C67.9

## 2013-09-03 ENCOUNTER — Other Ambulatory Visit: Payer: Self-pay | Admitting: Family Medicine

## 2013-09-03 DIAGNOSIS — I5033 Acute on chronic diastolic (congestive) heart failure: Secondary | ICD-10-CM

## 2013-09-03 DIAGNOSIS — E119 Type 2 diabetes mellitus without complications: Secondary | ICD-10-CM

## 2013-09-05 ENCOUNTER — Telehealth: Payer: Self-pay | Admitting: Family Medicine

## 2013-09-05 ENCOUNTER — Other Ambulatory Visit: Payer: Self-pay | Admitting: Family Medicine

## 2013-09-05 DIAGNOSIS — E119 Type 2 diabetes mellitus without complications: Secondary | ICD-10-CM

## 2013-09-05 DIAGNOSIS — I5033 Acute on chronic diastolic (congestive) heart failure: Secondary | ICD-10-CM

## 2013-09-05 MED ORDER — CARVEDILOL 3.125 MG PO TABS: 3.1250 mg | ORAL_TABLET | Freq: Two times a day (BID) | ORAL | Status: DC

## 2013-09-05 MED ORDER — POTASSIUM CHLORIDE CRYS ER 20 MEQ PO TBCR: 20.0000 meq | EXTENDED_RELEASE_TABLET | Freq: Every day | ORAL | Status: DC

## 2013-09-05 MED ORDER — FUROSEMIDE 40 MG PO TABS: 20.0000 mg | ORAL_TABLET | Freq: Two times a day (BID) | ORAL | Status: DC

## 2013-09-05 MED ORDER — METFORMIN HCL 500 MG PO TABS: ORAL_TABLET | ORAL | Status: DC

## 2013-09-05 MED ORDER — METFORMIN HCL 500 MG PO TABS: 500.0000 mg | ORAL_TABLET | Freq: Two times a day (BID) | ORAL | Status: DC

## 2013-09-05 NOTE — Telephone Encounter (Signed)
Call patient : Prescription refilled & sent to pharmacy in EP[facility]. 

## 2013-09-05 NOTE — Telephone Encounter (Signed)
harmacy changed in computer please send in Rx

## 2013-09-05 NOTE — Telephone Encounter (Signed)
Rx ready for nurse to Phone in. I couldn't find [facility] in [place] to send Rxs so please call in. Thanks. Hester Forget P. Jacelyn Grip, M.D.

## 2013-09-06 NOTE — Telephone Encounter (Signed)
Spoke with pt and she is aware rx sent in and to call if questions

## 2013-09-27 ENCOUNTER — Ambulatory Visit (INDEPENDENT_AMBULATORY_CARE_PROVIDER_SITE_OTHER): Payer: Medicare Other | Admitting: Family Medicine

## 2013-09-27 ENCOUNTER — Encounter: Payer: Self-pay | Admitting: Family Medicine

## 2013-09-27 VITALS — BP 131/69 | HR 73 | Temp 97.9°F | Ht 66.0 in | Wt 165.4 lb

## 2013-09-27 DIAGNOSIS — J9620 Acute and chronic respiratory failure, unspecified whether with hypoxia or hypercapnia: Secondary | ICD-10-CM

## 2013-09-27 DIAGNOSIS — E559 Vitamin D deficiency, unspecified: Secondary | ICD-10-CM

## 2013-09-27 DIAGNOSIS — R600 Localized edema: Secondary | ICD-10-CM

## 2013-09-27 DIAGNOSIS — N3289 Other specified disorders of bladder: Secondary | ICD-10-CM

## 2013-09-27 DIAGNOSIS — J449 Chronic obstructive pulmonary disease, unspecified: Secondary | ICD-10-CM

## 2013-09-27 DIAGNOSIS — E0590 Thyrotoxicosis, unspecified without thyrotoxic crisis or storm: Secondary | ICD-10-CM

## 2013-09-27 DIAGNOSIS — E119 Type 2 diabetes mellitus without complications: Secondary | ICD-10-CM

## 2013-09-27 DIAGNOSIS — D62 Acute posthemorrhagic anemia: Secondary | ICD-10-CM

## 2013-09-27 DIAGNOSIS — I10 Essential (primary) hypertension: Secondary | ICD-10-CM

## 2013-09-27 DIAGNOSIS — H01003 Unspecified blepharitis right eye, unspecified eyelid: Secondary | ICD-10-CM

## 2013-09-27 DIAGNOSIS — I5033 Acute on chronic diastolic (congestive) heart failure: Secondary | ICD-10-CM

## 2013-09-27 DIAGNOSIS — R609 Edema, unspecified: Secondary | ICD-10-CM

## 2013-09-27 DIAGNOSIS — H01009 Unspecified blepharitis unspecified eye, unspecified eyelid: Secondary | ICD-10-CM

## 2013-09-27 LAB — POCT GLYCOSYLATED HEMOGLOBIN (HGB A1C): Hemoglobin A1C: 6

## 2013-09-27 MED ORDER — POTASSIUM CHLORIDE CRYS ER 20 MEQ PO TBCR: 20.0000 meq | EXTENDED_RELEASE_TABLET | Freq: Every day | ORAL | Status: DC

## 2013-09-27 MED ORDER — METFORMIN HCL 500 MG PO TABS: ORAL_TABLET | ORAL | Status: DC

## 2013-09-27 MED ORDER — SULFACETAMIDE SODIUM 10 % OP SOLN: 2.0000 [drp] | OPHTHALMIC | Status: DC

## 2013-09-27 NOTE — Progress Notes (Signed)
Patient ID: Sonya Oliver, female   DOB: [birth year], 73 y.o.   MRN: [id number] SUBJECTIVE: CC: Chief Complaint  Patient presents with  . Follow-up    3 month follow up  weight up from 161 lb to 165.4lb today  . Medication Refill    refill K+ and metformin    HPI: Patient is here for follow up of Diabetes Mellitus/CHF/HTN/Bladder mass/: Symptoms evaluated: Denies Nocturia ,Denies Urinary Frequency , denies Blurred vision ,deniesDizziness,denies.Dysuria,denies paresthesias, denies extremity pain or ulcers.Marland Kitchendenies chest pain. has had an annual eye exam. do check the feet. Does check CBGs. Average CBG: good when she checks it. Denies episodes of hypoglycemia. Does have an emergency hypoglycemic plan. admits toCompliance with medications. Denies Problems with medications.   Past Medical History  Diagnosis Date  . Hypertension   . Thyroid disease HYPERTHYROIDISM--  NO LONGER ON MED. PT STATES BLOOD WORK BACK TO NORMAL  . 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 Arthritis   . H/O pulmonary edema JAN 2014  . Diabetes mellitus type II, controlled   . Dyspnea on exertion   . Nocturnal oxygen desaturation USES O2 HS VIA [place]  . 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 CHF (congestive heart failure) ACUTE CHF W/ PULM, EDEMA --  JAN 2014    NO CARDIOLOGIST---  MONITORED BY PCP DR Jacelyn Grip  . Nocturia   . COPD with emphysema    Past Surgical History  Procedure Laterality Date  . Cystoscopy  03/23/2012    Procedure: CYSTOSCOPY;  Surgeon: Claybon Jabs, MD;  Location: [facility];  Service: Urology;  Laterality: N/A;  . Transurethral resection of bladder tumor  03/23/2012    Procedure: TRANSURETHRAL RESECTION OF BLADDER TUMOR (TURBT);  Surgeon: Claybon Jabs, MD;  Location: [facility];  Service: Urology;  Laterality: N/A;  . Transthoracic echocardiogram  09-13-2012    MILDLY DILATED LV  &  RV/ LVSF NORMAL/ EF 55-60%/ MILD MR/ MILDLY DILATED LA  &  RA/ SYSTOLIC PRESSURE MODERATELY  INCREASED PA  . Abdominal hysterectomy  1980'S    W/ BILATERAL SALPINGO-OOPHORECTOMY  . Transurethral resection of bladder tumor N/A 01/08/2013    Procedure: TRANSURETHRAL RESECTION OF BLADDER TUMOR (TURBT);  Surgeon: Claybon Jabs, MD;  Location: [facility];  Service: Urology;  Laterality: N/A;   History   Social History  . Marital Status: Widowed    Spouse Name: N/A    Number of Children: N/A  . Years of Education: N/A   Occupational History  . Not on file.   Social History Main Topics  . Smoking status: Former Smoker -- 1.00 packs/day for 50 years    Types: Cigarettes    Quit date: 09/24/2006  . Smokeless tobacco: Never Used  . Alcohol Use: No  . Drug Use: No  . Sexual Activity: No   Other Topics Concern  . Not on file   Social History Narrative   Occupation: worked in [place] yrs   Family History  Problem Relation Age of Onset  . Diabetes Mother   . Diabetes Father    Current Outpatient Prescriptions on File Prior to Visit  Medication Sig Dispense Refill  . capsaicin (ZOSTRIX) 0.025 % cream Apply topically 2 (two) times daily. Apply to feet  60 g  0  . carvedilol (COREG) 3.125 MG tablet Take 1 tablet (3.125 mg total) by mouth 2 (two) times daily with a meal.  60 tablet  2  . Cholecalciferol (VITAMIN D3) 2000 UNITS TABS Take 1 tablet by mouth daily.      . furosemide (LASIX) 40 MG tablet Take 0.5 tablets (20 mg total) by mouth 2 (two) times daily.  60 tablet  4  . metolazone (ZAROXOLYN) 5 MG tablet TAKE 1 TABLET (5 MG TOTAL) BY MOUTH DAILY. AS NEEDED 5 LB WEIGHT GAIN  30 tablet  0   No current facility-administered medications on file prior to visit.   No Known Allergies Immunization History  Administered Date(s) Administered  . Influenza,inj,Quad PF,36+ Mos 06/20/2013  . Pneumococcal Polysaccharide-23 03/31/2011  . Tdap 03/31/2011  . Zoster 04/16/2013   Prior to Admission medications   Medication Sig Start Date End Date Taking?  Authorizing Provider  capsaicin (ZOSTRIX) 0.025 % cream Apply topically 2 (two) times daily. Apply to feet 06/23/13  Yes Janece Canterbury, MD  carvedilol (COREG) 3.125 MG tablet Take 1 tablet (3.125 mg total) by mouth 2 (two) times daily with a meal. 09/05/13  Yes Vernie Shanks, MD  Cholecalciferol (VITAMIN D3) 2000 UNITS TABS Take 1 tablet by mouth daily.   Yes Historical Provider, MD  furosemide (LASIX) 40 MG tablet Take 0.5 tablets (20 mg total) by mouth 2 (two) times daily. 09/05/13  Yes Vernie Shanks, MD  metFORMIN (GLUCOPHAGE) 500 MG tablet TAKE 1 TABLET BY MOUTH DAILY 09/27/13  Yes Vernie Shanks, MD  metolazone (ZAROXOLYN) 5 MG tablet TAKE 1 TABLET (5 MG TOTAL) BY MOUTH DAILY. AS NEEDED 5 LB WEIGHT GAIN 08/16/13  Yes Vernie Shanks, MD  potassium chloride SA (KLOR-CON M20) 20 MEQ tablet Take 1 tablet (20 mEq total) by mouth daily. 09/27/13  Yes Vernie Shanks, MD  sulfacetamide (BLEPH-10) 10 % ophthalmic solution Place 2 drops into the right eye every 3 (three) hours. 09/27/13   Vernie Shanks, MD     ROS: As above in the HPI. All other systems are stable or negative.  OBJECTIVE: APPEARANCE:  Patient in no acute distress.The patient appeared well nourished and normally developed. Acyanotic. Waist: VITAL SIGNS:BP 131/69  Pulse 73  Temp(Src) 97.9 F (36.6 C) (Oral)  Ht 5' 6"  (1.676 m)  Wt 165 lb 6.4 oz (75.025 kg)  BMI 26.71 kg/m2  WM SKIN: warm and  Dry without overt rashes, tattoos and scars  HEAD and Neck: without JVD, Head and scalp: normal Eyes:No scleral icterus. Fundi normal, eye movements normal. Right lower eyelid red with purulence from the sulcus. she does have a stye. Ears: Auricle normal, canal normal, Tympanic membranes normal, insufflation normal. Nose: normal Throat: normal Neck & thyroid: normal  CHEST & LUNGS: Chest wall: normal Lungs: Clear  CVS: Reveals the PMI to be normally located. Regular rhythm, First and Second Heart sounds are normal,  absence  of murmurs, rubs or gallops. Peripheral vasculature: Radial pulses: normal Dorsal pedis pulses: normal Posterior pulses: normal  ABDOMEN:  Appearance: normal Benign, no organomegaly, no masses, no Abdominal Aortic enlargement. No Guarding , no rebound. No Bruits. Bowel sounds: normal  RECTAL: N/A GU: N/A  EXTREMETIES: nonedematous.  MUSCULOSKELETAL:  Spine: normal Joints: intact  NEUROLOGIC: oriented to time,place and person; nonfocal. Strength is normal Sensory is normal Reflexes are normal Cranial Nerves are normal. Results for orders placed in visit on 09/27/13  HM DEXA SCAN      Result Value Range   HM Dexa Scan done       ASSESSMENT: Hyperthyroidism  HTN (hypertension) - Plan: BMP8+EGFR, potassium chloride SA (KLOR-CON M20)  20 MEQ tablet  Edema leg  Diabetes - Plan: POCT glycosylated hemoglobin (Hb A1C), metFORMIN (GLUCOPHAGE) 500 MG tablet  COPD (chronic obstructive pulmonary disease)  Bladder mass  Anemia associated with acute blood loss  Acute-on-chronic respiratory failure  Acute on chronic diastolic heart failure  Unspecified vitamin D deficiency  Blepharitis of right eye - Plan: sulfacetamide (BLEPH-10) 10 % ophthalmic solution  PLAN:      Dr Paula Libra Recommendations  For nutrition information, I recommend books:  1).Eat to Live by Dr Excell Seltzer. 2).Prevent and Reverse Heart Disease by Dr Karl Luke. 3) Dr Janene Harvey Book:  Program to Reverse Diabetes  Exercise recommendations are:  If unable to walk, then the patient can exercise in a chair 3 times a day. By flapping arms like a bird gently and raising legs outwards to the front.  If ambulatory, the patient can go for walks for 30 minutes 3 times a week. Then increase the intensity and duration as tolerated.  Goal is to try to attain exercise frequency to 5 times a week.  If applicable: Best to perform resistance exercises (machines or weights) 2 days a week and  cardio type exercises 3 days per week.   Diabetes foot care.handout in the AVS.  Orders Placed This Encounter  Procedures  . HM DEXA SCAN    This external order was created through the Results Console.  [name] BMP8+EGFR  . POCT glycosylated hemoglobin (Hb A1C)   Meds ordered this encounter  Medications  . metFORMIN (GLUCOPHAGE) 500 MG tablet    Sig: TAKE 1 TABLET BY MOUTH DAILY    Dispense:  30 tablet    Refill:  5  . potassium chloride SA (KLOR-CON M20) 20 MEQ tablet    Sig: Take 1 tablet (20 mEq total) by mouth daily.    Dispense:  30 tablet    Refill:  5  . sulfacetamide (BLEPH-10) 10 % ophthalmic solution    Sig: Place 2 drops into the right eye every 3 (three) hours.    Dispense:  15 mL    Refill:  1  warm compresses.  Medications Discontinued During This Encounter  Medication Reason  . metFORMIN (GLUCOPHAGE) 257 MG tablet Duplicate  . metFORMIN (GLUCOPHAGE) 500 MG tablet Reorder  . potassium chloride SA (KLOR-CON M20) 20 MEQ tablet Reorder   Return in about 2 months (around 11/25/2013) for Recheck medical problems.  Sie Formisano P. Jacelyn Grip, M.D.

## 2013-09-27 NOTE — Patient Instructions (Signed)
Dr Paula Libra Recommendations  For nutrition information, I recommend books:  1).Eat to Live by Dr Excell Seltzer. 2).Prevent and Reverse Heart Disease by Dr Karl Luke. 3) Dr Janene Harvey Book:  Program to Reverse Diabetes  Exercise recommendations are:  If unable to walk, then the patient can exercise in a chair 3 times a day. By flapping arms like a bird gently and raising legs outwards to the front.  If ambulatory, the patient can go for walks for 30 minutes 3 times a week. Then increase the intensity and duration as tolerated.  Goal is to try to attain exercise frequency to 5 times a week.  If applicable: Best to perform resistance exercises (machines or weights) 2 days a week and cardio type exercises 3 days per week.   Diabetes and Foot Care Diabetes may cause you to have problems because of poor blood supply (circulation) to your feet and legs. This may cause the skin on your feet to become thinner, break easier, and heal more slowly. Your skin may become dry, and the skin may peel and crack. You may also have nerve damage in your legs and feet causing decreased feeling in them. You may not notice minor injuries to your feet that could lead to infections or more serious problems. Taking care of your feet is one of the most important things you can do for yourself.  HOME CARE INSTRUCTIONS  Wear shoes at all times, even in the house. Do not go barefoot. Bare feet are easily injured.  Check your feet daily for blisters, cuts, and redness. If you cannot see the bottom of your feet, use a mirror or ask someone for help.  Wash your feet with warm water (do not use hot water) and mild soap. Then pat your feet and the areas between your toes until they are completely dry. Do not soak your feet as this can dry your skin.  Apply a moisturizing lotion or petroleum jelly (that does not contain alcohol and is unscented) to the skin on your feet and to dry, brittle toenails.  Do not apply lotion between your toes.  Trim your toenails straight across. Do not dig under them or around the cuticle. File the edges of your nails with an emery board or nail file.  Do not cut corns or calluses or try to remove them with medicine.  Wear clean socks or stockings every day. Make sure they are not too tight. Do not wear knee-high stockings since they may decrease blood flow to your legs.  Wear shoes that fit properly and have enough cushioning. To break in new shoes, wear them for just a few hours a day. This prevents you from injuring your feet. Always look in your shoes before you put them on to be sure there are no objects inside.  Do not cross your legs. This may decrease the blood flow to your feet.  If you find a minor scrape, cut, or break in the skin on your feet, keep it and the skin around it clean and dry. These areas may be cleansed with mild soap and water. Do not cleanse the area with peroxide, alcohol, or iodine.  When you remove an adhesive bandage, be sure not to damage the skin around it.  If you have a wound, look at it several times a day to make sure it is healing.  Do not use heating pads or hot water bottles. They may burn your skin. If  you have lost feeling in your feet or legs, you may not know it is happening until it is too late.  Make sure your health care provider performs a complete foot exam at least annually or more often if you have foot problems. Report any cuts, sores, or bruises to your health care provider immediately. SEEK MEDICAL CARE IF:   You have an injury that is not healing.  You have cuts or breaks in the skin.  You have an ingrown nail.  You notice redness on your legs or feet.  You feel burning or tingling in your legs or feet.  You have pain or cramps in your legs and feet.  Your legs or feet are numb.  Your feet always feel cold. SEEK IMMEDIATE MEDICAL CARE IF:   There is increasing redness, swelling, or pain in  or around a wound.  There is a red line that goes up your leg.  Pus is coming from a wound.  You develop a fever or as directed by your health care provider.  You notice a bad smell coming from an ulcer or wound. Document Released: 08/13/2000 Document Revised: 04/18/2013 Document Reviewed: 01/23/2013 [facility] Patient Information 2014 [place].

## 2013-09-28 LAB — BMP8+EGFR
BUN/Creatinine Ratio: 21 (ref 11–26)
BUN: 16 mg/dL (ref 8–27)
CO2: 27 mmol/L (ref 18–29)
Calcium: 9.3 mg/dL (ref 8.7–10.3)
Chloride: 99 mmol/L (ref 97–108)
Creatinine, Ser: 0.78 mg/dL (ref 0.57–1.00)
GFR calc Af Amer: 87 mL/min/{1.73_m2} (ref >59)
GFR calc non Af Amer: 75 mL/min/{1.73_m2} (ref >59)
Glucose: 79 mg/dL (ref 65–99)
Potassium: 4.1 mmol/L (ref 3.5–5.2)
Sodium: 141 mmol/L (ref 134–144)

## 2013-10-13 ENCOUNTER — Other Ambulatory Visit: Payer: Self-pay | Admitting: Family Medicine

## 2013-10-16 NOTE — Telephone Encounter (Signed)
Call patient : Prescription refilled & sent to pharmacy in EP[facility]. 

## 2013-10-27 ENCOUNTER — Other Ambulatory Visit: Payer: Self-pay | Admitting: Family Medicine

## 2013-11-01 ENCOUNTER — Other Ambulatory Visit: Payer: Self-pay | Admitting: Family Medicine

## 2013-11-04 ENCOUNTER — Other Ambulatory Visit: Payer: Self-pay | Admitting: Family Medicine

## 2013-11-27 ENCOUNTER — Ambulatory Visit: Payer: Medicare Other | Admitting: Family Medicine

## 2013-12-15 ENCOUNTER — Other Ambulatory Visit: Payer: Self-pay | Admitting: Family Medicine

## 2013-12-21 ENCOUNTER — Encounter: Payer: Self-pay | Admitting: Family Medicine

## 2013-12-21 ENCOUNTER — Ambulatory Visit (INDEPENDENT_AMBULATORY_CARE_PROVIDER_SITE_OTHER): Payer: Medicare Other | Admitting: Family Medicine

## 2013-12-21 VITALS — BP 132/69 | HR 62 | Temp 97.0°F | Ht 66.0 in | Wt 170.6 lb

## 2013-12-21 DIAGNOSIS — D62 Acute posthemorrhagic anemia: Secondary | ICD-10-CM

## 2013-12-21 DIAGNOSIS — D649 Anemia, unspecified: Secondary | ICD-10-CM

## 2013-12-21 DIAGNOSIS — R609 Edema, unspecified: Secondary | ICD-10-CM

## 2013-12-21 DIAGNOSIS — R600 Localized edema: Secondary | ICD-10-CM

## 2013-12-21 DIAGNOSIS — J9620 Acute and chronic respiratory failure, unspecified whether with hypoxia or hypercapnia: Secondary | ICD-10-CM

## 2013-12-21 DIAGNOSIS — I5033 Acute on chronic diastolic (congestive) heart failure: Secondary | ICD-10-CM

## 2013-12-21 DIAGNOSIS — N3289 Other specified disorders of bladder: Secondary | ICD-10-CM

## 2013-12-21 DIAGNOSIS — J449 Chronic obstructive pulmonary disease, unspecified: Secondary | ICD-10-CM

## 2013-12-21 DIAGNOSIS — E119 Type 2 diabetes mellitus without complications: Secondary | ICD-10-CM

## 2013-12-21 DIAGNOSIS — E0590 Thyrotoxicosis, unspecified without thyrotoxic crisis or storm: Secondary | ICD-10-CM

## 2013-12-21 DIAGNOSIS — I10 Essential (primary) hypertension: Secondary | ICD-10-CM

## 2013-12-21 DIAGNOSIS — E559 Vitamin D deficiency, unspecified: Secondary | ICD-10-CM

## 2013-12-21 LAB — POCT CBC
Granulocyte percent: 52.6 %G (ref 37–80)
HCT, POC: 38.5 % (ref 37.7–47.9)
Hemoglobin: 12 g/dL — AB (ref 12.2–16.2)
Lymph, poc: 2 (ref 0.6–3.4)
MCH, POC: 27.3 pg (ref 27–31.2)
MCHC: 31.2 g/dL — AB (ref 31.8–35.4)
MCV: 87.7 fL (ref 80–97)
MPV: 7.4 fL (ref 0–99.8)
POC Granulocyte: 2.6 (ref 2–6.9)
POC LYMPH PERCENT: 39.3 %L (ref 10–50)
Platelet Count, POC: 156 10*3/uL (ref 142–424)
RBC: 4.4 M/uL (ref 4.04–5.48)
RDW, POC: 13.9 %
WBC: 5 10*3/uL (ref 4.6–10.2)

## 2013-12-21 LAB — POCT GLYCOSYLATED HEMOGLOBIN (HGB A1C): Hemoglobin A1C: 5.75

## 2013-12-21 MED ORDER — METOLAZONE 5 MG PO TABS: ORAL_TABLET | ORAL | Status: DC

## 2013-12-21 NOTE — Progress Notes (Signed)
Patient ID: Sonya Oliver, female   DOB: 01-15-1941, 73 y.o.   MRN: [id number] SUBJECTIVE: CC: Chief Complaint  Patient presents with  . Follow-up    2 MONTH FOLLOW UP CHRONIC PROBLEMS  . Medication Refill    REFILL ZAROXLYN    HPI: Came for follow up and feeling good overall. Has  Seen Dr Chalmers Cater about her thyroid  Disease and has 6 months to see if she needs to proceed to thyroid surgery.  Patient is here for follow up of Diabetes Mellitus/HTN/HLD Symptoms evaluated: Denies Nocturia ,Denies Urinary Frequency , denies Blurred vision ,deniesDizziness,denies.Dysuria,denies paresthesias, denies extremity pain or ulcers.Marland Kitchendenies chest pain. has had an annual eye exam. do check the feet. Does check CBGs. Average AOZ:HYQMVH Denies episodes of hypoglycemia. Does have an emergency hypoglycemic plan. admits toCompliance with medications. Denies Problems with medications.   Past Medical History  Diagnosis Date  . Hypertension   . Thyroid disease HYPERTHYROIDISM--  NO LONGER ON MED. PT STATES BLOOD WORK BACK TO NORMAL  . 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 Arthritis   . H/O pulmonary edema JAN 2014  . Diabetes mellitus type II, controlled   . Dyspnea on exertion   . Nocturnal oxygen desaturation USES O2 HS VIA [place]  . 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 CHF (congestive heart failure) ACUTE CHF W/ PULM, EDEMA --  JAN 2014    NO CARDIOLOGIST---  MONITORED BY PCP DR Jacelyn Grip  . Nocturia   . COPD with emphysema    Past Surgical History  Procedure Laterality Date  . Cystoscopy  03/23/2012    Procedure: CYSTOSCOPY;  Surgeon: Claybon Jabs, MD;  Location: [facility];  Service: Urology;  Laterality: N/A;  . Transurethral resection of bladder tumor  03/23/2012    Procedure: TRANSURETHRAL RESECTION OF BLADDER TUMOR (TURBT);  Surgeon: Claybon Jabs, MD;  Location: [facility];  Service: Urology;  Laterality: N/A;  . Transthoracic echocardiogram  09-13-2012    MILDLY DILATED LV  &   RV/ LVSF NORMAL/ EF 55-60%/ MILD MR/ MILDLY DILATED LA  &  RA/ SYSTOLIC PRESSURE MODERATELY INCREASED PA  . Abdominal hysterectomy  1980'S    W/ BILATERAL SALPINGO-OOPHORECTOMY  . Transurethral resection of bladder tumor N/A 01/08/2013    Procedure: TRANSURETHRAL RESECTION OF BLADDER TUMOR (TURBT);  Surgeon: Claybon Jabs, MD;  Location: [facility];  Service: Urology;  Laterality: N/A;   History   Social History  . Marital Status: Widowed    Spouse Name: N/A    Number of Children: N/A  . Years of Education: N/A   Occupational History  . Not on file.   Social History Main Topics  . Smoking status: Former Smoker -- 1.00 packs/day for 50 years    Types: Cigarettes    Quit date: 09/24/2006  . Smokeless tobacco: Never Used  . Alcohol Use: No  . Drug Use: No  . Sexual Activity: No   Other Topics Concern  . Not on file   Social History Narrative   Occupation: worked in [place] yrs   Family History  Problem Relation Age of Onset  . Diabetes Mother   . Diabetes Father    Current Outpatient Prescriptions on File Prior to Visit  Medication Sig Dispense Refill  . capsaicin (ZOSTRIX) 0.025 % cream Apply topically 2 (two) times daily. Apply to feet  60 g  0  . carvedilol (COREG) 3.125 MG tablet TAKE  1 TABLET BY MOUTH TWICE DAILY  60 tablet  4  . Cholecalciferol (VITAMIN D3) 2000 UNITS TABS Take 1 tablet by mouth daily.      . furosemide (LASIX) 40 MG tablet TAKE 1 TABLET BY MOUTH ONCE DAILY  30 tablet  1  . metFORMIN (GLUCOPHAGE) 500 MG tablet TAKE 1 TABLET BY MOUTH DAILY  30 tablet  5  . potassium chloride SA (KLOR-CON M20) 20 MEQ tablet Take 1 tablet (20 mEq total) by mouth daily.  30 tablet  2  . sulfacetamide (BLEPH-10) 10 % ophthalmic solution Place 2 drops into the right eye every 3 (three) hours.  15 mL  1   No current facility-administered medications on file prior to visit.   No Known Allergies Immunization History  Administered Date(s)  Administered  . Influenza,inj,Quad PF,36+ Mos 06/20/2013  . Pneumococcal Polysaccharide-23 03/31/2011  . Tdap 03/31/2011  . Zoster 04/16/2013   Prior to Admission medications   Medication Sig Start Date End Date Taking? Authorizing Provider  capsaicin (ZOSTRIX) 0.025 % cream Apply topically 2 (two) times daily. Apply to feet 06/23/13  Yes Janece Canterbury, MD  carvedilol (COREG) 3.125 MG tablet TAKE 1 TABLET BY MOUTH TWICE DAILY   Yes Chipper Herb, MD  Cholecalciferol (VITAMIN D3) 2000 UNITS TABS Take 1 tablet by mouth daily.   Yes Historical Provider, MD  furosemide (LASIX) 40 MG tablet TAKE 1 TABLET BY MOUTH ONCE DAILY   Yes Vernie Shanks, MD  metFORMIN (GLUCOPHAGE) 500 MG tablet TAKE 1 TABLET BY MOUTH DAILY 09/27/13  Yes Vernie Shanks, MD  metolazone (ZAROXOLYN) 5 MG tablet TAKE 1 TABLET (5 MG TOTAL) BY MOUTH DAILY. AS NEEDED 5 LB WEIGHT GAIN   Yes Vernie Shanks, MD  potassium chloride SA (KLOR-CON M20) 20 MEQ tablet Take 1 tablet (20 mEq total) by mouth daily.   Yes Vernie Shanks, MD  sulfacetamide (BLEPH-10) 10 % ophthalmic solution Place 2 drops into the right eye every 3 (three) hours. 09/27/13  Yes Vernie Shanks, MD     ROS: As above in the HPI. All other systems are stable or negative.  OBJECTIVE: APPEARANCE:  Patient in no acute distress.The patient appeared well nourished and normally developed. Acyanotic. Waist: VITAL SIGNS:BP 132/69  Pulse 62  Temp(Src) 97 F (36.1 C) (Oral)  Ht 5' 6" (1.676 m)  Wt 170 lb 9.6 oz (77.384 kg)  BMI 27.55 kg/m2 AAF Ambulates with a rolling walker  SKIN: warm and  Dry without overt rashes, tattoos and scars  HEAD and Neck: without JVD, Head and scalp: normal Eyes:No scleral icterus. , eye movements abnormal.left eye exotropia Ears: Auricle normal, canal normal, Tympanic membranes normal, insufflation normal. Nose: normal Throat: normal Neck & thyroid: normal  CHEST & LUNGS: Chest wall: normal Lungs: Clear  CVS: Reveals  the PMI to be normally located. Regular rhythm, First and Second Heart sounds are normal,  absence of murmurs, rubs or gallops. Peripheral vasculature: Radial pulses: normal Dorsal pedis pulses: normal Posterior pulses: normal  ABDOMEN:  Appearance: normal Benign, no organomegaly, no masses, no Abdominal Aortic enlargement. No Guarding , no rebound. No Bruits. Bowel sounds: normal  RECTAL: N/A GU: N/A  EXTREMETIES: 2+ pedal edema.  NEUROLOGIC: oriented to time,place and person; nonfocal.  Results for orders placed in visit on 09/27/13  HM DEXA SCAN      Result Value Ref Range   HM Dexa Scan done     [facility]      Result Value  Ref Range   Glucose 79  65 - 99 mg/dL   BUN 16  8 - 27 mg/dL   Creatinine, Ser 0.78  0.57 - 1.00 mg/dL   GFR calc non Af Amer 75  >59 mL/min/1.73   GFR calc Af Amer 87  >59 mL/min/1.73   BUN/Creatinine Ratio 21  11 - 26   Sodium 141  134 - 144 mmol/L   Potassium 4.1  3.5 - 5.2 mmol/L   Chloride 99  97 - 108 mmol/L   CO2 27  18 - 29 mmol/L   Calcium 9.3  8.7 - 10.3 mg/dL  POCT GLYCOSYLATED HEMOGLOBIN (HGB A1C)      Result Value Ref Range   Hemoglobin A1C 6.0      ASSESSMENT:  Hyperthyroidism  HTN (hypertension) - Plan: CMP14+EGFR  Edema leg  Diabetes - Plan: POCT glycosylated hemoglobin (Hb A1C), CMP14+EGFR, Lipid panel  COPD (chronic obstructive pulmonary disease)  Anemia associated with acute blood loss  Acute on chronic diastolic heart failure - Plan: metolazone (ZAROXOLYN) 5 MG tablet  Acute-on-chronic respiratory failure  Unspecified vitamin D deficiency - Plan: Vit D  25 hydroxy (rtn osteoporosis monitoring)  Bladder mass  Anemia - Plan: POCT CBC  PLAN:   Continue with activities.  Healthy diet.  No change in medications.  Await labs.  Orders Placed This Encounter  Procedures  . CMP14+EGFR  . Vit D  25 hydroxy (rtn osteoporosis monitoring)  . Lipid panel  . POCT glycosylated hemoglobin (Hb A1C)  . POCT  CBC   Meds ordered this encounter  Medications  . metolazone (ZAROXOLYN) 5 MG tablet    Sig: TAKE 1 TABLET (5 MG TOTAL) BY MOUTH DAILY. AS NEEDED 5 LB WEIGHT GAIN    Dispense:  30 tablet    Refill:  2   Medications Discontinued During This Encounter  Medication Reason  . metolazone (ZAROXOLYN) 5 MG tablet Reorder   Return in about 3 months (around 03/22/2014) for Recheck medical problems.   P. Jacelyn Grip, M.D.

## 2013-12-22 LAB — VITAMIN D 25 HYDROXY (VIT D DEFICIENCY, FRACTURES): Vit D, 25-Hydroxy: 26 ng/mL — ABNORMAL LOW (ref 30.0–100.0)

## 2013-12-22 LAB — CMP14+EGFR
ALT: 11 IU/L (ref 0–32)
AST: 18 IU/L (ref 0–40)
Albumin/Globulin Ratio: 1 — ABNORMAL LOW (ref 1.1–2.5)
Albumin: 4 g/dL (ref 3.5–4.8)
Alkaline Phosphatase: 68 IU/L (ref 39–117)
BUN/Creatinine Ratio: 25 (ref 11–26)
BUN: 20 mg/dL (ref 8–27)
CO2: 30 mmol/L — ABNORMAL HIGH (ref 18–29)
Calcium: 9.8 mg/dL (ref 8.7–10.3)
Chloride: 98 mmol/L (ref 97–108)
Creatinine, Ser: 0.8 mg/dL (ref 0.57–1.00)
GFR calc Af Amer: 84 mL/min/{1.73_m2} (ref >59)
GFR calc non Af Amer: 73 mL/min/{1.73_m2} (ref >59)
Globulin, Total: 3.9 g/dL (ref 1.5–4.5)
Glucose: 86 mg/dL (ref 65–99)
Potassium: 4.6 mmol/L (ref 3.5–5.2)
Sodium: 140 mmol/L (ref 134–144)
Total Bilirubin: 0.5 mg/dL (ref 0.0–1.2)
Total Protein: 7.9 g/dL (ref 6.0–8.5)

## 2013-12-22 LAB — LIPID PANEL
Chol/HDL Ratio: 2.1 ratio units (ref 0.0–4.4)
Cholesterol, Total: 199 mg/dL (ref 100–199)
HDL: 95 mg/dL (ref >39)
LDL Calculated: 95 mg/dL (ref 0–99)
Triglycerides: 43 mg/dL (ref 0–149)
VLDL Cholesterol Cal: 9 mg/dL (ref 5–40)

## 2014-01-03 ENCOUNTER — Other Ambulatory Visit: Payer: Self-pay | Admitting: Family Medicine

## 2014-01-07 ENCOUNTER — Ambulatory Visit: Payer: Self-pay | Admitting: Family Medicine

## 2014-01-08 ENCOUNTER — Encounter: Payer: Self-pay | Admitting: Family Medicine

## 2014-01-08 ENCOUNTER — Ambulatory Visit (INDEPENDENT_AMBULATORY_CARE_PROVIDER_SITE_OTHER): Payer: Medicare Other | Admitting: Family Medicine

## 2014-01-08 VITALS — BP 122/64 | HR 67 | Temp 97.8°F | Ht 66.0 in | Wt 168.0 lb

## 2014-01-08 DIAGNOSIS — I5033 Acute on chronic diastolic (congestive) heart failure: Secondary | ICD-10-CM

## 2014-01-08 DIAGNOSIS — I509 Heart failure, unspecified: Secondary | ICD-10-CM

## 2014-01-08 DIAGNOSIS — E0590 Thyrotoxicosis, unspecified without thyrotoxic crisis or storm: Secondary | ICD-10-CM

## 2014-01-08 MED ORDER — METOLAZONE 5 MG PO TABS: ORAL_TABLET | ORAL | Status: DC

## 2014-01-08 MED ORDER — POTASSIUM CHLORIDE CRYS ER 20 MEQ PO TBCR: 20.0000 meq | EXTENDED_RELEASE_TABLET | Freq: Every day | ORAL | Status: DC

## 2014-01-08 NOTE — Progress Notes (Signed)
   Subjective:    Patient ID: Sonya Oliver, female    DOB: 1940-11-25, 73 y.o.   MRN: [id number]  HPI This 73 y.o. female presents for evaluation of CHF and weight gain.  Her weight is stable and she has  No c/o PND or orthopnea.  She has been using zaroxyln for weight gain and has diuresed well with this.  She has hyperthyroidism and is seeing Dr. Chalmers Cater endocrinology.  She takes carvdilol.    Review of Systems    No chest pain, SOB, HA, dizziness, vision change, N/V, diarrhea, constipation, dysuria, urinary urgency or frequency, myalgias, arthralgias or rash.  Objective:   Physical Exam  Vital signs noted  Well developed well nourished female with walker.  HEENT - Head atraumatic Normocephalic                Eyes - PERRLA, Conjuctiva - clear Sclera- Clear EOMI                Ears - EAC's Wnl TM's Wnl Gross Hearing WNL                Throat - oropharanx wnl Respiratory - Lungs CTA bilateral Cardiac - RRR S1 and S2 without murmur GI - Abdomen soft Nontender and bowel sounds active x 4      Assessment & Plan:  Acute on chronic diastolic heart failure - Plan: potassium chloride SA (KLOR-CON M20) 20 MEQ tablet, metolazone (ZAROXOLYN) 5 MG tablet  CHF (congestive heart failure) - Currently controlled and continue zaroxyly  Hyperthyroidism - Follow up with Dr. Sabino Snipes FNP

## 2014-01-16 ENCOUNTER — Encounter (HOSPITAL_COMMUNITY): Payer: Self-pay | Admitting: Pharmacy Technician

## 2014-01-23 NOTE — Patient Instructions (Signed)
RAJAH LAMBA  01/23/2014   Your procedure is scheduled on:  01/31/14  Report to [facility] at 7:00 AM.  Call this number if you have problems the morning of surgery: [phone]   Remember:   Do not eat food or drink liquids after midnight.   Take these medicines the morning of surgery with A SIP OF WATER: COREG ONLY   Do not wear jewelry, make-up or nail polish.  Do not wear lotions, powders, or perfumes. You may wear deodorant.  Do not shave 48 hours prior to surgery. Men may shave face and neck.  Do not bring valuables to the hospital.  [facility] is not responsible for any belongings or valuables.               Contacts, dentures or bridgework may not be worn into surgery.  Leave suitcase in the car. After surgery it may be brought to your room.  For patients admitted to the hospital, discharge time is determined by your treatment team.               Patients discharged the day of surgery will not be allowed to drive home.  Name and phone number of your driver:   Special Instructions: N/A   Please read over the following fact sheets that you were given: Care and Recovery After Surgery   PATIENT INSTRUCTIONS POST-ANESTHESIA  IMMEDIATELY FOLLOWING SURGERY:  Do not drive or operate machinery for the first twenty four hours after surgery.  Do not make any important decisions for twenty four hours after surgery or while taking narcotic pain medications or sedatives.  If you develop intractable nausea and vomiting or a severe headache please notify your doctor immediately.  FOLLOW-UP:  Please make an appointment with your surgeon as instructed. You do not need to follow up with anesthesia unless specifically instructed to do so.  WOUND CARE INSTRUCTIONS (if applicable):  Keep a dry clean dressing on the anesthesia/puncture wound site if there is drainage.  Once the wound has quit draining you may leave it open to air.  Generally you should leave the bandage intact for twenty four  hours unless there is drainage.  If the epidural site drains for more than 36-48 hours please call the anesthesia department.  QUESTIONS?:  Please feel free to call your physician or the hospital operator if you have any questions, and they will be happy to assist you.      Cataract Surgery Care After Refer to this sheet in the next few weeks. These instructions provide you with information on caring for yourself after your procedure. Your caregiver may also give you more specific instructions. Your treatment has been planned according to current medical practices, but problems sometimes occur. Call your caregiver if you have any problems or questions after your procedure.  HOME CARE INSTRUCTIONS   Avoid strenuous activities as directed by your caregiver.  Ask your caregiver when you can resume driving.  Use eyedrops or other medicines to help healing and control pressure inside your eye as directed by your caregiver.  Only take over-the-counter or prescription medicines for pain, discomfort, or fever as directed by your caregiver.  Do not to touch or rub your eyes.  You may be instructed to use a protective shield during the first few days and nights after surgery. If not, wear sunglasses to protect your eyes. This is to protect the eye from pressure or from being accidentally bumped.  Keep the area around your eye  clean and dry. Avoid swimming or allowing water to hit you directly in the face while showering. Keep soap and shampoo out of your eyes.  Do not bend or lift heavy objects. Bending increases pressure in the eye. You can walk, climb stairs, and do light household chores.  Do not put a contact lens into the eye that had surgery until your caregiver says it is okay to do so.  Ask your doctor when you can return to work. This will depend on the kind of work that you do. If you work in a dusty environment, you may be advised to wear protective eyewear for a period of time.  Ask  your caregiver when it will be safe to engage in sexual activity.  Continue with your regular eye exams as directed by your caregiver. What to expect:  It is normal to feel itching and mild discomfort for a few days after cataract surgery. Some fluid discharge is also common, and your eye may be sensitive to light and touch.  After 1 to 2 days, even moderate discomfort should disappear. In most cases, healing will take about 6 weeks.  If you received an intraocular lens (IOL), you may notice that colors are very bright or have a blue tinge. Also, if you have been in bright sunlight, everything may appear reddish for a few hours. If you see these color tinges, it is because your lens is clear and no longer cloudy. Within a few months after receiving an IOL, these extra colors should go away. When you have healed, you will probably need new glasses. SEEK MEDICAL CARE IF:   You have increased bruising around your eye.  You have discomfort not helped by medicine. SEEK IMMEDIATE MEDICAL CARE IF:   You have a fever.  You have a worsening or sudden vision loss.  You have redness, swelling, or increasing pain in the eye.  You have a thick discharge from the eye that had surgery. MAKE SURE YOU:  Understand these instructions.  Will watch your condition.  Will get help right away if you are not doing well or get worse. Document Released: 03/05/2005 Document Revised: 11/08/2011 Document Reviewed: 04/09/2011 [facility] Patient Information 2014 [place], [place].

## 2014-01-24 ENCOUNTER — Encounter (HOSPITAL_COMMUNITY): Payer: Self-pay

## 2014-01-24 ENCOUNTER — Encounter (HOSPITAL_COMMUNITY)
Admission: RE | Admit: 2014-01-24 | Discharge: 2014-01-24 | Disposition: A | Discharge status: Home / Self Care | Payer: Medicare Other | Source: Ambulatory Visit | Attending: Ophthalmology | Admitting: Ophthalmology

## 2014-01-24 ENCOUNTER — Other Ambulatory Visit: Payer: Self-pay

## 2014-01-24 DIAGNOSIS — Z01810 Encounter for preprocedural cardiovascular examination: Secondary | ICD-10-CM | POA: Insufficient documentation

## 2014-01-24 DIAGNOSIS — Z01812 Encounter for preprocedural laboratory examination: Secondary | ICD-10-CM | POA: Insufficient documentation

## 2014-01-24 LAB — BASIC METABOLIC PANEL
BUN: 21 mg/dL (ref 6–23)
CO2: 32 mEq/L (ref 19–32)
Calcium: 9.7 mg/dL (ref 8.4–10.5)
Chloride: 96 mEq/L (ref 96–112)
Creatinine, Ser: 0.86 mg/dL (ref 0.50–1.10)
GFR calc Af Amer: 75 mL/min — ABNORMAL LOW (ref >90)
GFR, EST NON AFRICAN AMERICAN: 65 mL/min — AB (ref >90)
GLUCOSE: 91 mg/dL (ref 70–99)
POTASSIUM: 4.4 meq/L (ref 3.7–5.3)
SODIUM: 138 meq/L (ref 137–147)

## 2014-01-24 LAB — HEMOGLOBIN AND HEMATOCRIT, BLOOD
HCT: 36.8 % (ref 36.0–46.0)
Hemoglobin: 11.8 g/dL — ABNORMAL LOW (ref 12.0–15.0)

## 2014-01-24 NOTE — Pre-Procedure Instructions (Signed)
Pt. Given info to set up MyChart at home.

## 2014-01-30 MED ORDER — TETRACAINE HCL 0.5 % OP SOLN
OPHTHALMIC | Status: AC
  Filled 2014-01-30: qty 2

## 2014-01-30 MED ORDER — NEOMYCIN-POLYMYXIN-DEXAMETH 3.5-10000-0.1 OP SUSP
OPHTHALMIC | Status: AC
  Filled 2014-01-30: qty 5

## 2014-01-30 MED ORDER — PHENYLEPHRINE HCL 2.5 % OP SOLN
OPHTHALMIC | Status: AC
  Filled 2014-01-30: qty 15

## 2014-01-30 MED ORDER — CYCLOPENTOLATE-PHENYLEPHRINE OP SOLN OPTIME - NO CHARGE
OPHTHALMIC | Status: AC
  Filled 2014-01-30: qty 2

## 2014-01-31 ENCOUNTER — Ambulatory Visit (HOSPITAL_COMMUNITY): Payer: Medicare Other | Admitting: Anesthesiology

## 2014-01-31 ENCOUNTER — Encounter (HOSPITAL_COMMUNITY)
Admission: RE | Disposition: A | Discharge status: Home / Self Care | Payer: Self-pay | Source: Ambulatory Visit | Attending: Ophthalmology

## 2014-01-31 ENCOUNTER — Ambulatory Visit (HOSPITAL_COMMUNITY)
Admission: RE | Admit: 2014-01-31 | Discharge: 2014-01-31 | Disposition: A | Discharge status: Home / Self Care | Payer: Medicare Other | Source: Ambulatory Visit | Attending: Ophthalmology | Admitting: Ophthalmology

## 2014-01-31 ENCOUNTER — Encounter (HOSPITAL_COMMUNITY): Payer: Self-pay | Admitting: *Deleted

## 2014-01-31 ENCOUNTER — Encounter (HOSPITAL_COMMUNITY): Payer: Medicare Other | Admitting: Anesthesiology

## 2014-01-31 DIAGNOSIS — H2589 Other age-related cataract: Secondary | ICD-10-CM | POA: Insufficient documentation

## 2014-01-31 DIAGNOSIS — E0590 Thyrotoxicosis, unspecified without thyrotoxic crisis or storm: Secondary | ICD-10-CM | POA: Insufficient documentation

## 2014-01-31 DIAGNOSIS — J4489 Other specified chronic obstructive pulmonary disease: Secondary | ICD-10-CM | POA: Insufficient documentation

## 2014-01-31 DIAGNOSIS — E119 Type 2 diabetes mellitus without complications: Secondary | ICD-10-CM | POA: Insufficient documentation

## 2014-01-31 DIAGNOSIS — Z79899 Other long term (current) drug therapy: Secondary | ICD-10-CM | POA: Insufficient documentation

## 2014-01-31 DIAGNOSIS — I10 Essential (primary) hypertension: Secondary | ICD-10-CM | POA: Insufficient documentation

## 2014-01-31 DIAGNOSIS — J449 Chronic obstructive pulmonary disease, unspecified: Secondary | ICD-10-CM | POA: Insufficient documentation

## 2014-01-31 DIAGNOSIS — Z8673 Personal history of transient ischemic attack (TIA), and cerebral infarction without residual deficits: Secondary | ICD-10-CM | POA: Insufficient documentation

## 2014-01-31 DIAGNOSIS — D649 Anemia, unspecified: Secondary | ICD-10-CM | POA: Insufficient documentation

## 2014-01-31 DIAGNOSIS — I509 Heart failure, unspecified: Secondary | ICD-10-CM | POA: Insufficient documentation

## 2014-01-31 DIAGNOSIS — Z8551 Personal history of malignant neoplasm of bladder: Secondary | ICD-10-CM | POA: Insufficient documentation

## 2014-01-31 DIAGNOSIS — H2181 Floppy iris syndrome: Secondary | ICD-10-CM | POA: Insufficient documentation

## 2014-01-31 HISTORY — PX: CATARACT EXTRACTION W/PHACO: SHX586

## 2014-01-31 LAB — GLUCOSE, CAPILLARY
GLUCOSE-CAPILLARY: 62 mg/dL — AB (ref 70–99)
GLUCOSE-CAPILLARY: 83 mg/dL (ref 70–99)
GLUCOSE-CAPILLARY: 69 mg/dL — AB (ref 70–99)
GLUCOSE-CAPILLARY: 75 mg/dL (ref 70–99)
Glucose-Capillary: 72 mg/dL (ref 70–99)

## 2014-01-31 SURGERY — PHACOEMULSIFICATION, CATARACT, WITH IOL INSERTION
Anesthesia: Monitor Anesthesia Care | Site: Eye | Laterality: Left

## 2014-01-31 MED ORDER — BSS IO SOLN
INTRAOCULAR | Status: DC | PRN
  Administered 2014-01-31: 15 mL via INTRAOCULAR

## 2014-01-31 MED ORDER — DEXTROSE 50 % IV SOLN
INTRAVENOUS | Status: AC
  Filled 2014-01-31: qty 50

## 2014-01-31 MED ORDER — FENTANYL CITRATE 0.05 MG/ML IJ SOLN: 25.0000 ug | INTRAMUSCULAR | Status: DC | PRN

## 2014-01-31 MED ORDER — LIDOCAINE HCL (PF) 1 % IJ SOLN
INTRAOCULAR | Status: DC | PRN
  Administered 2014-01-31 (×2): via OPHTHALMIC

## 2014-01-31 MED ORDER — MIDAZOLAM HCL 2 MG/2ML IJ SOLN
1.0000 mg | INTRAMUSCULAR | Status: DC | PRN
  Administered 2014-01-31: 2 mg via INTRAVENOUS

## 2014-01-31 MED ORDER — LIDOCAINE HCL 3.5 % OP GEL
1.0000 "application " | Freq: Once | OPHTHALMIC | Status: AC
  Administered 2014-01-31: 1 via OPHTHALMIC

## 2014-01-31 MED ORDER — EPINEPHRINE HCL 1 MG/ML IJ SOLN
INTRAMUSCULAR | Status: AC
  Filled 2014-01-31: qty 1

## 2014-01-31 MED ORDER — POVIDONE-IODINE 5 % OP SOLN
OPHTHALMIC | Status: DC | PRN
  Administered 2014-01-31: 1 via OPHTHALMIC

## 2014-01-31 MED ORDER — PHENYLEPHRINE HCL 2.5 % OP SOLN
1.0000 [drp] | OPHTHALMIC | Status: AC | PRN
  Administered 2014-01-31 (×3): 1 [drp] via OPHTHALMIC

## 2014-01-31 MED ORDER — DEXTROSE 50 % IV SOLN
12.5000 g | Freq: Once | INTRAVENOUS | Status: AC
  Administered 2014-01-31: 08:00:00 via INTRAVENOUS

## 2014-01-31 MED ORDER — TETRACAINE HCL 0.5 % OP SOLN
1.0000 [drp] | OPHTHALMIC | Status: AC | PRN
  Administered 2014-01-31 (×3): 1 [drp] via OPHTHALMIC

## 2014-01-31 MED ORDER — PROVISC 10 MG/ML IO SOLN
INTRAOCULAR | Status: DC | PRN
  Administered 2014-01-31: 0.85 mL via INTRAOCULAR

## 2014-01-31 MED ORDER — FENTANYL CITRATE 0.05 MG/ML IJ SOLN
INTRAMUSCULAR | Status: AC
  Filled 2014-01-31: qty 2

## 2014-01-31 MED ORDER — DEXTROSE 50 % IV SOLN
12.5000 g | Freq: Once | INTRAVENOUS | Status: AC
  Administered 2014-01-31: 12.5 g via INTRAVENOUS

## 2014-01-31 MED ORDER — FENTANYL CITRATE 0.05 MG/ML IJ SOLN
25.0000 ug | INTRAMUSCULAR | Status: AC
  Administered 2014-01-31: 25 ug via INTRAVENOUS

## 2014-01-31 MED ORDER — LACTATED RINGERS IV SOLN
INTRAVENOUS | Status: DC
  Administered 2014-01-31: 08:00:00 via INTRAVENOUS

## 2014-01-31 MED ORDER — CYCLOPENTOLATE-PHENYLEPHRINE 0.2-1 % OP SOLN
1.0000 [drp] | OPHTHALMIC | Status: AC | PRN
  Administered 2014-01-31 (×3): 1 [drp] via OPHTHALMIC

## 2014-01-31 MED ORDER — MIDAZOLAM HCL 5 MG/5ML IJ SOLN
INTRAMUSCULAR | Status: DC | PRN
  Administered 2014-01-31: 1 mg via INTRAVENOUS

## 2014-01-31 MED ORDER — NEOMYCIN-POLYMYXIN-DEXAMETH 3.5-10000-0.1 OP SUSP
OPHTHALMIC | Status: DC | PRN
  Administered 2014-01-31: 1 [drp] via OPHTHALMIC

## 2014-01-31 MED ORDER — ONDANSETRON HCL 4 MG/2ML IJ SOLN: 4.0000 mg | Freq: Once | INTRAMUSCULAR | Status: DC | PRN

## 2014-01-31 MED ORDER — EPINEPHRINE HCL 1 MG/ML IJ SOLN
INTRAOCULAR | Status: DC | PRN
  Administered 2014-01-31: 08:00:00

## 2014-01-31 MED ORDER — MIDAZOLAM HCL 2 MG/2ML IJ SOLN
INTRAMUSCULAR | Status: AC
  Filled 2014-01-31: qty 2

## 2014-01-31 SURGICAL SUPPLY — 33 items
CAPSULAR TENSION RING-AMO (OPHTHALMIC RELATED) IMPLANT
CLOTH BEACON ORANGE TIMEOUT ST (SAFETY) ×3 IMPLANT
EYE SHIELD UNIVERSAL CLEAR (GAUZE/BANDAGES/DRESSINGS) ×3 IMPLANT
GLOVE BIO SURGEON STRL SZ 6.5 (GLOVE) IMPLANT
GLOVE BIO SURGEONS STRL SZ 6.5 (GLOVE)
GLOVE BIOGEL PI IND STRL 6.5 (GLOVE) IMPLANT
GLOVE BIOGEL PI IND STRL 7.0 (GLOVE) ×2 IMPLANT
GLOVE BIOGEL PI IND STRL 7.5 (GLOVE) IMPLANT
GLOVE BIOGEL PI INDICATOR 6.5 (GLOVE)
GLOVE BIOGEL PI INDICATOR 7.0 (GLOVE) ×4
GLOVE BIOGEL PI INDICATOR 7.5 (GLOVE)
GLOVE ECLIPSE 6.5 STRL STRAW (GLOVE) IMPLANT
GLOVE ECLIPSE 7.0 STRL STRAW (GLOVE) IMPLANT
GLOVE ECLIPSE 7.5 STRL STRAW (GLOVE) IMPLANT
GLOVE EXAM NITRILE LRG STRL (GLOVE) IMPLANT
GLOVE EXAM NITRILE MD LF STRL (GLOVE) IMPLANT
GLOVE SKINSENSE NS SZ6.5 (GLOVE)
GLOVE SKINSENSE NS SZ7.0 (GLOVE)
GLOVE SKINSENSE STRL SZ6.5 (GLOVE) IMPLANT
GLOVE SKINSENSE STRL SZ7.0 (GLOVE) IMPLANT
KIT VITRECTOMY (OPHTHALMIC RELATED) IMPLANT
PAD ARMBOARD 7.5X6 YLW CONV (MISCELLANEOUS) ×3 IMPLANT
PROC W NO LENS (INTRAOCULAR LENS)
PROC W SPEC LENS (INTRAOCULAR LENS)
PROCESS W NO LENS (INTRAOCULAR LENS) IMPLANT
PROCESS W SPEC LENS (INTRAOCULAR LENS) IMPLANT
RING MALYGIN (MISCELLANEOUS) IMPLANT
SIGHTPATH CAT PROC W REG LENS (Ophthalmic Related) ×3 IMPLANT
SYR TB 1ML LL NO SAFETY (SYRINGE) ×3 IMPLANT
TAPE SURG TRANSPORE 1 IN (GAUZE/BANDAGES/DRESSINGS) ×1 IMPLANT
TAPE SURGICAL TRANSPORE 1 IN (GAUZE/BANDAGES/DRESSINGS) ×2
VISCOELASTIC ADDITIONAL (OPHTHALMIC RELATED) ×3 IMPLANT
WATER STERILE IRR 250ML POUR (IV SOLUTION) ×3 IMPLANT

## 2014-01-31 NOTE — Anesthesia Preprocedure Evaluation (Addendum)
Anesthesia Evaluation  Patient identified by MRN, date of birth, ID band Patient awake    Reviewed: Allergy & Precautions, H&P , NPO status , Patient's Chart, lab work & pertinent test results, reviewed documented beta blocker date and time   Airway Mallampati: II TM Distance: >3 FB Neck ROM: Full    Dental no notable dental hx. (+) Edentulous Upper, Upper Dentures, Dental Advisory Given   Pulmonary shortness of breath and with exertion, COPD COPD inhaler, former smoker,  breath sounds clear to auscultation  Pulmonary exam normal       Cardiovascular hypertension, Pt. on medications and Pt. on home beta blockers +CHF negative cardio ROS  Rhythm:Regular Rate:Normal  Echo 08/2012 - Left ventricle: The cavity size was mildly dilated. Wall   thickness was normal. Systolic function was normal. The   estimated ejection fraction was in the range of 55% to   60%. Wall motion was normal; there were no regional wall  motion abnormalities. - Mitral valve: Mild regurgitation. - Left atrium: The atrium was mildly dilated. - Right ventricle: The cavity size was mildly dilated. Wall   thickness was normal. - Right atrium: The atrium was mildly dilated. - Pulmonary arteries: Systolic pressure was moderately   increased. PA peak pressure: 72mm Hg (S).    Neuro/Psych negative neurological ROS  negative psych ROS   GI/Hepatic negative GI ROS, Neg liver ROS,   Endo/Other  diabetes, Type 2, Oral Hypoglycemic AgentsHyperthyroidism   Renal/GU negative Renal ROS     Musculoskeletal negative musculoskeletal ROS (+)   Abdominal   Peds  Hematology negative hematology ROS (+) anemia ,   Anesthesia Other Findings   Reproductive/Obstetrics negative OB ROS                         Anesthesia Physical Anesthesia Plan  ASA: III  Anesthesia Plan: MAC   Post-op Pain Management:    Induction: Intravenous  Airway  Management Planned: Nasal Cannula  Additional Equipment:   Intra-op Plan:   Post-operative Plan:   Informed Consent: I have reviewed the patients History and Physical, chart, labs and discussed the procedure including the risks, benefits and alternatives for the proposed anesthesia with the patient or authorized representative who has indicated his/her understanding and acceptance.     Plan Discussed with:   Anesthesia Plan Comments: (1/2 amp D50 for CBG=83)      Anesthesia Quick Evaluation

## 2014-01-31 NOTE — Transfer of Care (Signed)
Immediate Anesthesia Transfer of Care Note  Patient: Sonya Oliver  Procedure(s) Performed: Procedure(s) with comments: CATARACT EXTRACTION PHACO AND INTRAOCULAR LENS PLACEMENT (IOC) (Left) - CDE: 23.78  Patient Location: Short Stay  Anesthesia Type:MAC  Level of Consciousness: awake  Airway & Oxygen Therapy: Patient Spontanous Breathing  Post-op Assessment: Report given to PACU RN  Post vital signs: Reviewed  Complications: No apparent anesthesia complications

## 2014-01-31 NOTE — Op Note (Signed)
Date of Admission: 01/31/2014  Date of Surgery: 01/31/2014   Pre-Op Dx: Cataract Left Eye  Post-Op Dx: Cataract Left  Eye,  Dx Code 366.19, Intraoperative Floppy Iris Syndrome,  Dx Code 364.81  Surgeon: Tonny Branch, M.D.  Assistants: None  Anesthesia: Topical with MAC  Indications: Painless, progressive loss of vision with compromise of daily activities.  Surgery: Cataract Extraction with Intraocular lens Implant Left Eye  Discription: The patient had dilating drops and viscous lidocaine placed into the Left eye in the pre-op holding area. After transfer to the operating room, a time out was performed. The patient was then prepped and draped. Beginning with a 70 degree blade a paracentesis port was made at the surgeon's 2 o'clock position. The anterior chamber was then filled with 1% non-preserved lidocaine with epi. This was followed by filling the anterior chamber with Provisc.  A 2.70mm keratome blade was used to make a clear corneal incision at the temporal limbus.  A bent cystatome needle was used to create a continuous tear capsulotomy. Hydrodissection was performed with balanced salt solution on a Fine canula. The lens nucleus was then removed using the phacoemulsification handpiece. Residual cortex was removed with the I&A handpiece. The anterior chamber and capsular bag were refilled with Provisc. A posterior chamber intraocular lens was placed into the capsular bag with it's injector. The implant was positioned with the Kuglan hook. The Provisc was then removed from the anterior chamber and capsular bag with the I&A handpiece. Stromal hydration of the main incision and paracentesis port was performed with BSS on a Fine canula. The wounds were tested for leak which was negative. The patient tolerated the procedure well. There were no operative complications. The patient was then transferred to the recovery room in stable condition.  Complications: None  Specimen: None  EBL:  None  Prosthetic device: Hoya iSert 250, power 17.5 D, SN [id number].

## 2014-01-31 NOTE — Anesthesia Postprocedure Evaluation (Signed)
  Anesthesia Post-op Note  Patient: Sonya Oliver  Procedure(s) Performed: Procedure(s) with comments: CATARACT EXTRACTION PHACO AND INTRAOCULAR LENS PLACEMENT (IOC) (Left) - CDE: 23.78  Patient Location: Short Stay  Anesthesia Type:MAC  Level of Consciousness: awake  Airway and Oxygen Therapy: Patient Spontanous Breathing  Post-op Pain: none  Post-op Assessment: Post-op Vital signs reviewed, Patient's Cardiovascular Status Stable, Respiratory Function Stable, Patent Airway and No signs of Nausea or vomiting  Post-op Vital Signs: Reviewed and stable  Last Vitals:  Filed Vitals:   01/31/14 0810  BP: 140/67  Resp: 33    Complications: No apparent anesthesia complications

## 2014-01-31 NOTE — H&P (Signed)
I have reviewed the H&P, the patient was re-examined, and I have identified no interval changes in medical condition and plan of care since the history and physical of record  

## 2014-02-01 ENCOUNTER — Encounter (HOSPITAL_COMMUNITY): Payer: Self-pay | Admitting: Ophthalmology

## 2014-02-18 ENCOUNTER — Encounter (HOSPITAL_COMMUNITY): Payer: Self-pay | Admitting: Pharmacy Technician

## 2014-02-21 ENCOUNTER — Encounter (HOSPITAL_COMMUNITY): Payer: Self-pay

## 2014-02-21 ENCOUNTER — Encounter (HOSPITAL_COMMUNITY)
Admission: RE | Admit: 2014-02-21 | Discharge: 2014-02-21 | Disposition: A | Discharge status: Home / Self Care | Payer: Medicare Other | Source: Ambulatory Visit | Attending: Ophthalmology | Admitting: Ophthalmology

## 2014-02-21 MED ORDER — MIDAZOLAM HCL 2 MG/2ML IJ SOLN: 1.0000 mg | INTRAMUSCULAR | Status: DC | PRN

## 2014-02-21 MED ORDER — FENTANYL CITRATE 0.05 MG/ML IJ SOLN: 25.0000 ug | INTRAMUSCULAR | Status: AC

## 2014-02-21 MED ORDER — LACTATED RINGERS IV SOLN: INTRAVENOUS | Status: DC

## 2014-02-27 MED ORDER — LIDOCAINE HCL (PF) 1 % IJ SOLN
INTRAMUSCULAR | Status: AC
  Filled 2014-02-27: qty 2

## 2014-02-27 MED ORDER — TETRACAINE HCL 0.5 % OP SOLN
OPHTHALMIC | Status: AC
  Filled 2014-02-27: qty 2

## 2014-02-27 MED ORDER — PHENYLEPHRINE HCL 2.5 % OP SOLN
OPHTHALMIC | Status: AC
Start: 2014-02-27 — End: 2014-02-27
  Filled 2014-02-27: qty 15

## 2014-02-27 MED ORDER — NEOMYCIN-POLYMYXIN-DEXAMETH 3.5-10000-0.1 OP SUSP
OPHTHALMIC | Status: AC
  Filled 2014-02-27: qty 5

## 2014-02-27 MED ORDER — CYCLOPENTOLATE-PHENYLEPHRINE OP SOLN OPTIME - NO CHARGE
OPHTHALMIC | Status: AC
  Filled 2014-02-27: qty 2

## 2014-02-27 MED ORDER — LIDOCAINE HCL 3.5 % OP GEL
OPHTHALMIC | Status: AC
  Filled 2014-02-27: qty 1

## 2014-02-28 ENCOUNTER — Encounter (HOSPITAL_COMMUNITY): Payer: Medicare Other | Admitting: Anesthesiology

## 2014-02-28 ENCOUNTER — Encounter (HOSPITAL_COMMUNITY)
Admission: RE | Disposition: A | Discharge status: Home / Self Care | Payer: Self-pay | Source: Ambulatory Visit | Attending: Ophthalmology

## 2014-02-28 ENCOUNTER — Ambulatory Visit (HOSPITAL_COMMUNITY): Payer: Medicare Other | Admitting: Anesthesiology

## 2014-02-28 ENCOUNTER — Ambulatory Visit (HOSPITAL_COMMUNITY)
Admission: RE | Admit: 2014-02-28 | Discharge: 2014-02-28 | Disposition: A | Discharge status: Home / Self Care | Payer: Medicare Other | Source: Ambulatory Visit | Attending: Ophthalmology | Admitting: Ophthalmology

## 2014-02-28 ENCOUNTER — Encounter (HOSPITAL_COMMUNITY): Payer: Self-pay | Admitting: *Deleted

## 2014-02-28 DIAGNOSIS — J449 Chronic obstructive pulmonary disease, unspecified: Secondary | ICD-10-CM | POA: Insufficient documentation

## 2014-02-28 DIAGNOSIS — D649 Anemia, unspecified: Secondary | ICD-10-CM | POA: Insufficient documentation

## 2014-02-28 DIAGNOSIS — Z79899 Other long term (current) drug therapy: Secondary | ICD-10-CM | POA: Insufficient documentation

## 2014-02-28 DIAGNOSIS — J4489 Other specified chronic obstructive pulmonary disease: Secondary | ICD-10-CM | POA: Insufficient documentation

## 2014-02-28 DIAGNOSIS — E119 Type 2 diabetes mellitus without complications: Secondary | ICD-10-CM | POA: Insufficient documentation

## 2014-02-28 DIAGNOSIS — I10 Essential (primary) hypertension: Secondary | ICD-10-CM | POA: Insufficient documentation

## 2014-02-28 DIAGNOSIS — H269 Unspecified cataract: Secondary | ICD-10-CM | POA: Insufficient documentation

## 2014-02-28 DIAGNOSIS — Z87891 Personal history of nicotine dependence: Secondary | ICD-10-CM | POA: Insufficient documentation

## 2014-02-28 DIAGNOSIS — H2181 Floppy iris syndrome: Secondary | ICD-10-CM | POA: Insufficient documentation

## 2014-02-28 HISTORY — PX: CATARACT EXTRACTION W/PHACO: SHX586

## 2014-02-28 LAB — GLUCOSE, CAPILLARY
Glucose-Capillary: 80 mg/dL (ref 70–99)
Glucose-Capillary: 76 mg/dL (ref 70–99)

## 2014-02-28 SURGERY — PHACOEMULSIFICATION, CATARACT, WITH IOL INSERTION
Anesthesia: Monitor Anesthesia Care | Site: Eye | Laterality: Right

## 2014-02-28 MED ORDER — LIDOCAINE HCL (PF) 1 % IJ SOLN
INTRAOCULAR | Status: DC | PRN
  Administered 2014-02-28: 08:00:00 via OPHTHALMIC

## 2014-02-28 MED ORDER — MIDAZOLAM HCL 2 MG/2ML IJ SOLN
1.0000 mg | INTRAMUSCULAR | Status: DC | PRN
  Administered 2014-02-28: 2 mg via INTRAVENOUS

## 2014-02-28 MED ORDER — FENTANYL CITRATE 0.05 MG/ML IJ SOLN
INTRAMUSCULAR | Status: AC
  Filled 2014-02-28: qty 2

## 2014-02-28 MED ORDER — PHENYLEPHRINE HCL 2.5 % OP SOLN
1.0000 [drp] | OPHTHALMIC | Status: AC
  Administered 2014-02-28 (×3): 1 [drp] via OPHTHALMIC

## 2014-02-28 MED ORDER — MIDAZOLAM HCL 2 MG/2ML IJ SOLN
INTRAMUSCULAR | Status: AC
  Filled 2014-02-28: qty 2

## 2014-02-28 MED ORDER — EPINEPHRINE HCL 1 MG/ML IJ SOLN
INTRAMUSCULAR | Status: AC
  Filled 2014-02-28: qty 1

## 2014-02-28 MED ORDER — BSS IO SOLN
INTRAOCULAR | Status: DC | PRN
  Administered 2014-02-28: 15 mL via INTRAOCULAR

## 2014-02-28 MED ORDER — FENTANYL CITRATE 0.05 MG/ML IJ SOLN
25.0000 ug | INTRAMUSCULAR | Status: AC
  Administered 2014-02-28 (×2): 25 ug via INTRAVENOUS

## 2014-02-28 MED ORDER — LIDOCAINE HCL (PF) 1 % IJ SOLN
INTRAMUSCULAR | Status: AC
  Filled 2014-02-28: qty 2

## 2014-02-28 MED ORDER — EPINEPHRINE HCL 1 MG/ML IJ SOLN
INTRAOCULAR | Status: DC | PRN
  Administered 2014-02-28: 08:00:00

## 2014-02-28 MED ORDER — FENTANYL CITRATE 0.05 MG/ML IJ SOLN: 25.0000 ug | INTRAMUSCULAR | Status: DC | PRN

## 2014-02-28 MED ORDER — DEXTROSE 50 % IV SOLN
12.5000 g | Freq: Once | INTRAVENOUS | Status: AC
  Administered 2014-02-28: 12.5 g via INTRAVENOUS

## 2014-02-28 MED ORDER — POVIDONE-IODINE 5 % OP SOLN
OPHTHALMIC | Status: DC | PRN
  Administered 2014-02-28: 1 via OPHTHALMIC

## 2014-02-28 MED ORDER — LIDOCAINE HCL 3.5 % OP GEL
1.0000 "application " | Freq: Once | OPHTHALMIC | Status: AC
  Administered 2014-02-28: 1 via OPHTHALMIC

## 2014-02-28 MED ORDER — LACTATED RINGERS IV SOLN
INTRAVENOUS | Status: DC
  Administered 2014-02-28: 1000 mL via INTRAVENOUS

## 2014-02-28 MED ORDER — PROVISC 10 MG/ML IO SOLN
INTRAOCULAR | Status: DC | PRN
  Administered 2014-02-28: 0.85 mL via INTRAOCULAR

## 2014-02-28 MED ORDER — CYCLOPENTOLATE-PHENYLEPHRINE 0.2-1 % OP SOLN
1.0000 [drp] | OPHTHALMIC | Status: AC
  Administered 2014-02-28 (×3): 1 [drp] via OPHTHALMIC

## 2014-02-28 MED ORDER — NEOMYCIN-POLYMYXIN-DEXAMETH 3.5-10000-0.1 OP SUSP
OPHTHALMIC | Status: DC | PRN
  Administered 2014-02-28: 2 [drp] via OPHTHALMIC

## 2014-02-28 MED ORDER — ONDANSETRON HCL 4 MG/2ML IJ SOLN: 4.0000 mg | Freq: Once | INTRAMUSCULAR | Status: DC | PRN

## 2014-02-28 MED ORDER — LIDOCAINE 3.5 % OP GEL OPTIME - NO CHARGE
OPHTHALMIC | Status: DC | PRN
  Administered 2014-02-28: 2 [drp] via OPHTHALMIC

## 2014-02-28 MED ORDER — DEXTROSE 50 % IV SOLN
INTRAVENOUS | Status: AC
  Filled 2014-02-28: qty 50

## 2014-02-28 MED ORDER — TETRACAINE HCL 0.5 % OP SOLN
1.0000 [drp] | OPHTHALMIC | Status: AC
  Administered 2014-02-28 (×3): 1 [drp] via OPHTHALMIC

## 2014-02-28 SURGICAL SUPPLY — 33 items
CAPSULAR TENSION RING-AMO (OPHTHALMIC RELATED) IMPLANT
CLOTH BEACON ORANGE TIMEOUT ST (SAFETY) ×2 IMPLANT
EYE SHIELD UNIVERSAL CLEAR (GAUZE/BANDAGES/DRESSINGS) ×2 IMPLANT
GLOVE BIO SURGEON STRL SZ 6.5 (GLOVE) IMPLANT
GLOVE BIOGEL PI IND STRL 6.5 (GLOVE) ×1 IMPLANT
GLOVE BIOGEL PI IND STRL 7.0 (GLOVE) IMPLANT
GLOVE BIOGEL PI IND STRL 7.5 (GLOVE) IMPLANT
GLOVE BIOGEL PI INDICATOR 6.5 (GLOVE) ×1
GLOVE BIOGEL PI INDICATOR 7.0 (GLOVE)
GLOVE BIOGEL PI INDICATOR 7.5 (GLOVE)
GLOVE ECLIPSE 6.5 STRL STRAW (GLOVE) IMPLANT
GLOVE ECLIPSE 7.0 STRL STRAW (GLOVE) IMPLANT
GLOVE ECLIPSE 7.5 STRL STRAW (GLOVE) IMPLANT
GLOVE EXAM NITRILE LRG STRL (GLOVE) IMPLANT
GLOVE EXAM NITRILE MD LF STRL (GLOVE) IMPLANT
GLOVE SKINSENSE NS SZ6.5 (GLOVE)
GLOVE SKINSENSE NS SZ7.0 (GLOVE)
GLOVE SKINSENSE STRL SZ6.5 (GLOVE) IMPLANT
GLOVE SKINSENSE STRL SZ7.0 (GLOVE) IMPLANT
GLOVE SS N UNI LF 7.0 STRL (GLOVE) ×2 IMPLANT
KIT VITRECTOMY (OPHTHALMIC RELATED) IMPLANT
PAD ARMBOARD 7.5X6 YLW CONV (MISCELLANEOUS) ×2 IMPLANT
PROC W NO LENS (INTRAOCULAR LENS)
PROC W SPEC LENS (INTRAOCULAR LENS)
PROCESS W NO LENS (INTRAOCULAR LENS) IMPLANT
PROCESS W SPEC LENS (INTRAOCULAR LENS) IMPLANT
RING MALYGIN (MISCELLANEOUS) ×2 IMPLANT
SIGHTPATH CAT PROC W REG LENS (Ophthalmic Related) ×2 IMPLANT
SYR TB 1ML LL NO SAFETY (SYRINGE) ×2 IMPLANT
TAPE SURG TRANSPORE 1 IN (GAUZE/BANDAGES/DRESSINGS) ×1 IMPLANT
TAPE SURGICAL TRANSPORE 1 IN (GAUZE/BANDAGES/DRESSINGS) ×1
VISCOELASTIC ADDITIONAL (OPHTHALMIC RELATED) IMPLANT
WATER STERILE IRR 250ML POUR (IV SOLUTION) ×2 IMPLANT

## 2014-02-28 NOTE — Op Note (Signed)
Date of Admission: 02/28/2014  Date of Surgery: 02/28/2014  Pre-Op Dx: Cataract  Right  Eye  Post-Op Dx: Cataract Right Eye,  Dx Code 366.16, Intraoperative Floppy Iris Syndrome Right eye, Dx Code 364.81  Surgeon: Tonny Branch, M.D.  Assistants: None  Anesthesia: Topical with MAC  Indications: Painless, progressive loss of vision with compromise of daily activities.  Surgery: Cataract Extraction with Intraocular lens Implant Right Eye  Discription: The patient had dilating drops and viscous lidocaine placed into the left eye in the pre-op holding area. After transfer to the operating room, a time out was performed. The patient was then prepped and draped. Beginning with a 74 degree blade a paracentesis port was made at the surgeon's 2 o'clock position. The anterior chamber was then filled with 1% non-preserved lidocaine with epinepherine. This was followed by filling the anterior chamber with Provisc. A Malyugan ring was placed into the anterior chamber using its injector. The loops were positioned with the Kuglan hook. A bent cystatome needle was used to create a continuous tear capsulotomy. Hydrodissection was performed with balanced salt solution on a Fine canula. The lens nucleus was then removed using the phacoemulsification handpiece. Residual cortex was removed with the I&A handpiece. The anterior chamber and capsular bag were refilled with Provisc. A posterior chamber intraocular lens was placed into the capsular bag with it's injector. The implant was positioned with the Kuglan hook. The Malyugan ring was disengaged from the iris margin and removed with its injector. The Provisc was then removed from the anterior chamber and capsular bag with the I&A handpiece. Stromal hydration of the main incision and paracentesis port was performed with BSS on a Fine canula. The wounds were tested for leak which was negative. The patient tolerated the procedure well. There were no operative complications. The  patient was then transferred to the recovery room in stable condition.  Complications: None  Specimen: None  EBL: None  Prosthetic device: Hoya iSert 250, power 19.0, SN [id number].

## 2014-02-28 NOTE — Progress Notes (Signed)
Dr Patsey Berthold notified of BS 76. Pt given regular coke to drink. No further testing.

## 2014-02-28 NOTE — Transfer of Care (Signed)
Immediate Anesthesia Transfer of Care Note  Patient: Sonya Oliver  Procedure(s) Performed: Procedure(s) with comments: CATARACT EXTRACTION PHACO AND INTRAOCULAR LENS PLACEMENT (IOC) (Right) - CDE:  11.34  Patient Location: Short Stay  Anesthesia Type:MAC  Level of Consciousness: awake and patient cooperative  Airway & Oxygen Therapy: Patient Spontanous Breathing  Post-op Assessment: Report given to PACU RN, Post -op Vital signs reviewed and stable and Patient moving all extremities  Post vital signs: Reviewed and stable  Complications: No apparent anesthesia complications

## 2014-02-28 NOTE — Anesthesia Preprocedure Evaluation (Signed)
Anesthesia Evaluation  Patient identified by MRN, date of birth, ID band Patient awake    Reviewed: Allergy & Precautions, H&P , NPO status , Patient's Chart, lab work & pertinent test results, reviewed documented beta blocker date and time   Airway Mallampati: II TM Distance: >3 FB Neck ROM: Full    Dental no notable dental hx. (+) Edentulous Upper, Upper Dentures, Dental Advisory Given   Pulmonary shortness of breath and with exertion, COPD COPD inhaler, former smoker,  breath sounds clear to auscultation  Pulmonary exam normal       Cardiovascular hypertension, Pt. on medications and Pt. on home beta blockers +CHF negative cardio ROS  Rhythm:Regular Rate:Normal  Echo 08/2012 - Left ventricle: The cavity size was mildly dilated. Wall   thickness was normal. Systolic function was normal. The   estimated ejection fraction was in the range of 55% to   60%. Wall motion was normal; there were no regional wall  motion abnormalities. - Mitral valve: Mild regurgitation. - Left atrium: The atrium was mildly dilated. - Right ventricle: The cavity size was mildly dilated. Wall   thickness was normal. - Right atrium: The atrium was mildly dilated. - Pulmonary arteries: Systolic pressure was moderately   increased. PA peak pressure: 60mm Hg (S).    Neuro/Psych negative neurological ROS  negative psych ROS   GI/Hepatic negative GI ROS, Neg liver ROS,   Endo/Other  diabetes, Type 2, Oral Hypoglycemic AgentsHyperthyroidism   Renal/GU negative Renal ROS     Musculoskeletal negative musculoskeletal ROS (+)   Abdominal   Peds  Hematology negative hematology ROS (+) anemia ,   Anesthesia Other Findings   Reproductive/Obstetrics negative OB ROS                           Anesthesia Physical Anesthesia Plan  ASA: III  Anesthesia Plan: MAC   Post-op Pain Management:    Induction: Intravenous  Airway  Management Planned: Nasal Cannula  Additional Equipment:   Intra-op Plan:   Post-operative Plan:   Informed Consent: I have reviewed the patients History and Physical, chart, labs and discussed the procedure including the risks, benefits and alternatives for the proposed anesthesia with the patient or authorized representative who has indicated his/her understanding and acceptance.     Plan Discussed with:   Anesthesia Plan Comments: (1/2 amp D50 for CBG=83)        Anesthesia Quick Evaluation

## 2014-02-28 NOTE — Anesthesia Postprocedure Evaluation (Signed)
  Anesthesia Post-op Note  Patient: Sonya Oliver  Procedure(s) Performed: Procedure(s) with comments: CATARACT EXTRACTION PHACO AND INTRAOCULAR LENS PLACEMENT (IOC) (Right) - CDE:  11.34  Patient Location: Short Stay  Anesthesia Type:MAC  Level of Consciousness: awake, alert , oriented and patient cooperative  Airway and Oxygen Therapy: Patient Spontanous Breathing  Post-op Pain: none  Post-op Assessment: Post-op Vital signs reviewed, Patient's Cardiovascular Status Stable, Respiratory Function Stable, Patent Airway and Pain level controlled  Post-op Vital Signs: Reviewed and stable  Last Vitals:  Filed Vitals:   02/28/14 0720  BP: 115/53  Temp:   Resp: 25    Complications: No apparent anesthesia complications

## 2014-02-28 NOTE — H&P (Signed)
I have reviewed the H&P, the patient was re-examined, and I have identified no interval changes in medical condition and plan of care since the history and physical of record  

## 2014-03-04 ENCOUNTER — Encounter (HOSPITAL_COMMUNITY): Payer: Self-pay | Admitting: Ophthalmology

## 2014-03-25 ENCOUNTER — Other Ambulatory Visit: Payer: Self-pay | Admitting: *Deleted

## 2014-03-25 MED ORDER — METFORMIN HCL 500 MG PO TABS: ORAL_TABLET | ORAL | Status: DC

## 2014-04-22 ENCOUNTER — Ambulatory Visit: Payer: Medicare Other | Admitting: Family Medicine

## 2014-04-26 ENCOUNTER — Other Ambulatory Visit: Payer: Self-pay | Admitting: Family Medicine

## 2014-04-29 NOTE — Telephone Encounter (Signed)
Last AIC-5.7 on 4/15. Last ov 5/15. ntbs

## 2014-04-30 ENCOUNTER — Other Ambulatory Visit: Payer: Self-pay | Admitting: *Deleted

## 2014-04-30 DIAGNOSIS — I5033 Acute on chronic diastolic (congestive) heart failure: Secondary | ICD-10-CM

## 2014-04-30 MED ORDER — CARVEDILOL 3.125 MG PO TABS: ORAL_TABLET | ORAL | Status: DC

## 2014-04-30 MED ORDER — METOLAZONE 5 MG PO TABS: ORAL_TABLET | ORAL | Status: DC

## 2014-04-30 MED ORDER — FUROSEMIDE 40 MG PO TABS: ORAL_TABLET | ORAL | Status: DC

## 2014-04-30 MED ORDER — METFORMIN HCL 500 MG PO TABS: ORAL_TABLET | ORAL | Status: DC

## 2014-04-30 MED ORDER — POTASSIUM CHLORIDE CRYS ER 20 MEQ PO TBCR: 20.0000 meq | EXTENDED_RELEASE_TABLET | Freq: Every day | ORAL | Status: DC

## 2014-05-21 ENCOUNTER — Ambulatory Visit: Payer: Medicare Other | Admitting: Family Medicine

## 2014-05-30 ENCOUNTER — Encounter: Payer: Self-pay | Admitting: Family Medicine

## 2014-05-30 ENCOUNTER — Ambulatory Visit (INDEPENDENT_AMBULATORY_CARE_PROVIDER_SITE_OTHER): Payer: Medicare Other | Admitting: Family Medicine

## 2014-05-30 VITALS — BP 154/67 | HR 68 | Temp 97.8°F | Ht 66.0 in | Wt 179.0 lb

## 2014-05-30 DIAGNOSIS — I5033 Acute on chronic diastolic (congestive) heart failure: Secondary | ICD-10-CM

## 2014-05-30 DIAGNOSIS — Z23 Encounter for immunization: Secondary | ICD-10-CM

## 2014-05-30 DIAGNOSIS — J449 Chronic obstructive pulmonary disease, unspecified: Secondary | ICD-10-CM

## 2014-05-30 DIAGNOSIS — E118 Type 2 diabetes mellitus with unspecified complications: Secondary | ICD-10-CM

## 2014-05-30 DIAGNOSIS — E0590 Thyrotoxicosis, unspecified without thyrotoxic crisis or storm: Secondary | ICD-10-CM

## 2014-05-30 DIAGNOSIS — I10 Essential (primary) hypertension: Secondary | ICD-10-CM

## 2014-05-30 LAB — POCT CBC
Granulocyte percent: 62.5 %G (ref 37–80)
HEMATOCRIT: 39.2 % (ref 37.7–47.9)
Hemoglobin: 12.5 g/dL (ref 12.2–16.2)
LYMPH, POC: 1.7 (ref 0.6–3.4)
MCH: 27.1 pg (ref 27–31.2)
MCHC: 31.8 g/dL (ref 31.8–35.4)
MCV: 85.1 fL (ref 80–97)
MPV: 8.2 fL (ref 0–99.8)
PLATELET COUNT, POC: 122 10*3/uL — AB (ref 142–424)
POC Granulocyte: 3.2 (ref 2–6.9)
POC LYMPH %: 33.2 % (ref 10–50)
RBC: 4.6 M/uL (ref 4.04–5.48)
RDW, POC: 13.8 %
WBC: 5.2 10*3/uL (ref 4.6–10.2)

## 2014-05-30 LAB — POCT GLYCOSYLATED HEMOGLOBIN (HGB A1C): Hemoglobin A1C: 6

## 2014-05-30 MED ORDER — POTASSIUM CHLORIDE CRYS ER 20 MEQ PO TBCR: 20.0000 meq | EXTENDED_RELEASE_TABLET | Freq: Every day | ORAL | Status: DC

## 2014-05-30 NOTE — Patient Instructions (Signed)
Schedule mammogram.

## 2014-05-30 NOTE — Progress Notes (Signed)
   Subjective:    Patient ID: Sonya Oliver, female    DOB: 07-Dec-1940, 73 y.o.   MRN: [id number]  HPI 73 year old female who is here on routine followup for her hypertension, COPD, and diabetes. She seems to be doing well. She uses a walker for ambulation due to knee problems (she is not interested in knee replacement). She does have what sounds to be urge incontinence but this may be related to her Lasix as well. She uses oxygen at night but has no inhalers and has no complaints from her COPD. During the course of her visit we did some screening questions. Cognitive function is intact as evidenced by mini cog exam. She denies depression. She helps take care of great-grandchildren and is physically active using her walker. She has had no falls. She does have some urge incontinence as noted above but she has learned how to control this with judicious use of furosemide. There is no history of peripheral arterial disease    Review of Systems  Respiratory: Negative.   Cardiovascular: Positive for leg swelling.  Gastrointestinal: Negative.   Endocrine: Negative.   Genitourinary: Negative.   Neurological: Negative.   Psychiatric/Behavioral: Negative.        Objective:   Physical Exam  Constitutional: She is oriented to person, place, and time. She appears well-developed and well-nourished.  Eyes: Conjunctivae and EOM are normal.  Neck: Normal range of motion. Neck supple.  Cardiovascular: Normal rate, regular rhythm and normal heart sounds.   Pulmonary/Chest: Effort normal and breath sounds normal.  Abdominal: Soft. Bowel sounds are normal.  Musculoskeletal: Normal range of motion.  Neurological: She is alert and oriented to person, place, and time. She has normal reflexes.  Skin: Skin is warm and dry.  Psychiatric: She has a normal mood and affect. Her behavior is normal. Thought content normal.    BP 154/67  Pulse 68  Temp(Src) 97.8 F (36.6 C) (Oral)  Ht 5\' 6"  (1.676 m)  Wt  179 lb (81.194 kg)  BMI 28.91 kg/m2      Assessment & Plan:  1. Hyperthyroidism   2. Essential hypertension Will check BMP  3. Type 2 diabetes mellitus with complication Check V9Y 4. Chronic obstructive pulmonary disease, unspecified COPD, unspecified chronic bronchitis type Continue O2 HS   Wardell Honour MD

## 2014-05-30 NOTE — Progress Notes (Signed)
   Subjective:    Patient ID: Sonya Oliver, female    DOB: 21-Nov-1940, 73 y.o.   MRN: [id number]  HPI   Patient Active Problem List   Diagnosis Date Noted  . Hypokalemia 08/22/2013  . Acute on chronic diastolic heart failure 79/10/4095  . Acute-on-chronic respiratory failure 06/20/2013  . CHF (congestive heart failure) 12/19/2012  . COPD (chronic obstructive pulmonary disease) 12/19/2012  . Unspecified vitamin D deficiency 12/19/2012  . HTN (hypertension) 12/19/2012  . SOB (shortness of breath) 09/12/2012  . Edema leg 09/12/2012  . Diabetes 09/12/2012  . Anemia 03/21/2012  . Syncope 03/21/2012  . Hematuria, gross 02/23/2012  . Anemia associated with acute blood loss 02/23/2012  . Orthostatic hypotension 02/23/2012  . Bladder mass 02/23/2012  . Hyperthyroidism 02/23/2012   Outpatient Encounter Prescriptions as of 05/30/2014  Medication Sig  . carvedilol (COREG) 3.125 MG tablet TAKE 1 TABLET BY MOUTH TWICE DAILY  . Cholecalciferol (VITAMIN D3) 2000 UNITS TABS Take 1 tablet by mouth daily.  . furosemide (LASIX) 40 MG tablet TAKE 1 TABLET BY MOUTH ONCE DAILY  . metFORMIN (GLUCOPHAGE) 500 MG tablet 250 mg. TAKE 1 TABLET BY MOUTH DAILY  . metolazone (ZAROXOLYN) 5 MG tablet TAKE 1 TABLET (5 MG TOTAL) BY MOUTH DAILY. AS NEEDED 5 LB WEIGHT GAIN  . potassium chloride SA (KLOR-CON M20) 20 MEQ tablet Take 1 tablet (20 mEq total) by mouth daily.  . [DISCONTINUED] metFORMIN (GLUCOPHAGE) 500 MG tablet TAKE 1 TABLET BY MOUTH DAILY    Review of Systems     Objective:   Physical Exam  BP 154/67  Pulse 68  Temp(Src) 97.8 F (36.6 C) (Oral)  Ht 5\' 6"  (1.676 m)  Wt 179 lb (81.194 kg)  BMI 28.91 kg/m2       Assessment & Plan:

## 2014-05-31 LAB — BMP8+EGFR
BUN / CREAT RATIO: 20 (ref 11–26)
BUN: 17 mg/dL (ref 8–27)
CHLORIDE: 97 mmol/L (ref 97–108)
CO2: 30 mmol/L — ABNORMAL HIGH (ref 18–29)
Calcium: 9.6 mg/dL (ref 8.7–10.3)
Creatinine, Ser: 0.85 mg/dL (ref 0.57–1.00)
GFR calc non Af Amer: 68 mL/min/{1.73_m2} (ref >59)
GFR, EST AFRICAN AMERICAN: 78 mL/min/{1.73_m2} (ref >59)
Glucose: 87 mg/dL (ref 65–99)
POTASSIUM: 4.4 mmol/L (ref 3.5–5.2)
Sodium: 141 mmol/L (ref 134–144)

## 2014-06-03 ENCOUNTER — Telehealth: Payer: Self-pay | Admitting: Family Medicine

## 2014-06-21 ENCOUNTER — Other Ambulatory Visit (INDEPENDENT_AMBULATORY_CARE_PROVIDER_SITE_OTHER): Payer: Medicare Other

## 2014-06-21 DIAGNOSIS — E0590 Thyrotoxicosis, unspecified without thyrotoxic crisis or storm: Secondary | ICD-10-CM

## 2014-06-21 NOTE — Progress Notes (Signed)
Lab only 

## 2014-06-22 LAB — CBC WITH DIFFERENTIAL
Basophils Absolute: 0 10*3/uL (ref 0.0–0.2)
Basos: 0 %
EOS: 3 %
Eosinophils Absolute: 0.1 10*3/uL (ref 0.0–0.4)
HCT: 36.2 % (ref 34.0–46.6)
Hemoglobin: 12.1 g/dL (ref 11.1–15.9)
Immature Grans (Abs): 0 10*3/uL (ref 0.0–0.1)
Immature Granulocytes: 0 %
LYMPHS ABS: 1.9 10*3/uL (ref 0.7–3.1)
Lymphs: 40 %
MCH: 27.8 pg (ref 26.6–33.0)
MCHC: 33.4 g/dL (ref 31.5–35.7)
MCV: 83 fL (ref 79–97)
MONOS ABS: 0.5 10*3/uL (ref 0.1–0.9)
Monocytes: 9 %
Neutrophils Absolute: 2.3 10*3/uL (ref 1.4–7.0)
Neutrophils Relative %: 48 %
Platelets: 163 10*3/uL (ref 150–379)
RBC: 4.35 x10E6/uL (ref 3.77–5.28)
RDW: 13.7 % (ref 12.3–15.4)
WBC: 4.8 10*3/uL (ref 3.4–10.8)

## 2014-06-22 LAB — TSH: TSH: 0.366 u[IU]/mL — ABNORMAL LOW (ref 0.450–4.500)

## 2014-08-21 ENCOUNTER — Emergency Department (HOSPITAL_COMMUNITY)
Admission: EM | Admit: 2014-08-21 | Discharge: 2014-08-21 | Disposition: A | Discharge status: Home / Self Care | Payer: Medicare Other | Attending: Emergency Medicine | Admitting: Emergency Medicine

## 2014-08-21 ENCOUNTER — Emergency Department (HOSPITAL_COMMUNITY): Payer: Medicare Other

## 2014-08-21 ENCOUNTER — Encounter (HOSPITAL_COMMUNITY): Payer: Self-pay | Admitting: Emergency Medicine

## 2014-08-21 DIAGNOSIS — Z8669 Personal history of other diseases of the nervous system and sense organs: Secondary | ICD-10-CM | POA: Diagnosis not present

## 2014-08-21 DIAGNOSIS — I509 Heart failure, unspecified: Secondary | ICD-10-CM | POA: Diagnosis not present

## 2014-08-21 DIAGNOSIS — Z8739 Personal history of other diseases of the musculoskeletal system and connective tissue: Secondary | ICD-10-CM | POA: Diagnosis not present

## 2014-08-21 DIAGNOSIS — J441 Chronic obstructive pulmonary disease with (acute) exacerbation: Secondary | ICD-10-CM | POA: Diagnosis not present

## 2014-08-21 DIAGNOSIS — E119 Type 2 diabetes mellitus without complications: Secondary | ICD-10-CM | POA: Diagnosis not present

## 2014-08-21 DIAGNOSIS — R0602 Shortness of breath: Secondary | ICD-10-CM | POA: Diagnosis present

## 2014-08-21 DIAGNOSIS — R600 Localized edema: Secondary | ICD-10-CM | POA: Diagnosis not present

## 2014-08-21 DIAGNOSIS — Z9981 Dependence on supplemental oxygen: Secondary | ICD-10-CM | POA: Insufficient documentation

## 2014-08-21 DIAGNOSIS — I10 Essential (primary) hypertension: Secondary | ICD-10-CM | POA: Diagnosis not present

## 2014-08-21 DIAGNOSIS — Z87891 Personal history of nicotine dependence: Secondary | ICD-10-CM | POA: Diagnosis not present

## 2014-08-21 DIAGNOSIS — Z79899 Other long term (current) drug therapy: Secondary | ICD-10-CM | POA: Insufficient documentation

## 2014-08-21 DIAGNOSIS — Z8551 Personal history of malignant neoplasm of bladder: Secondary | ICD-10-CM | POA: Diagnosis not present

## 2014-08-21 DIAGNOSIS — R0600 Dyspnea, unspecified: Secondary | ICD-10-CM

## 2014-08-21 LAB — CBC WITH DIFFERENTIAL/PLATELET
Basophils Absolute: 0 10*3/uL (ref 0.0–0.1)
Basophils Relative: 0 % (ref 0–1)
Eosinophils Absolute: 0 10*3/uL (ref 0.0–0.7)
Eosinophils Relative: 1 % (ref 0–5)
HEMATOCRIT: 41.2 % (ref 36.0–46.0)
Hemoglobin: 13.3 g/dL (ref 12.0–15.0)
LYMPHS ABS: 1 10*3/uL (ref 0.7–4.0)
LYMPHS PCT: 21 % (ref 12–46)
MCH: 28.1 pg (ref 26.0–34.0)
MCHC: 32.3 g/dL (ref 30.0–36.0)
MCV: 87.1 fL (ref 78.0–100.0)
MONO ABS: 0.5 10*3/uL (ref 0.1–1.0)
MONOS PCT: 11 % (ref 3–12)
NEUTROS ABS: 3.1 10*3/uL (ref 1.7–7.7)
Neutrophils Relative %: 67 % (ref 43–77)
Platelets: 126 10*3/uL — ABNORMAL LOW (ref 150–400)
RBC: 4.73 MIL/uL (ref 3.87–5.11)
RDW: 13.8 % (ref 11.5–15.5)
WBC: 4.6 10*3/uL (ref 4.0–10.5)

## 2014-08-21 LAB — BASIC METABOLIC PANEL
Anion gap: 8 (ref 5–15)
BUN: 21 mg/dL (ref 6–23)
CO2: 30 mmol/L (ref 19–32)
Calcium: 9.4 mg/dL (ref 8.4–10.5)
Chloride: 100 mEq/L (ref 96–112)
Creatinine, Ser: 1.09 mg/dL (ref 0.50–1.10)
GFR calc Af Amer: 56 mL/min — ABNORMAL LOW (ref >90)
GFR calc non Af Amer: 48 mL/min — ABNORMAL LOW (ref >90)
GLUCOSE: 114 mg/dL — AB (ref 70–99)
Potassium: 4 mmol/L (ref 3.5–5.1)
SODIUM: 138 mmol/L (ref 135–145)

## 2014-08-21 LAB — TROPONIN I

## 2014-08-21 LAB — BRAIN NATRIURETIC PEPTIDE: B Natriuretic Peptide: 90.1 pg/mL (ref 0.0–100.0)

## 2014-08-21 MED ORDER — METHYLPREDNISOLONE SODIUM SUCC 125 MG IJ SOLR
125.0000 mg | Freq: Once | INTRAMUSCULAR | Status: AC
  Administered 2014-08-21: 125 mg via INTRAVENOUS
  Filled 2014-08-21: qty 2

## 2014-08-21 MED ORDER — PREDNISONE 10 MG PO TABS: 40.0000 mg | ORAL_TABLET | Freq: Every day | ORAL | Status: DC

## 2014-08-21 MED ORDER — ALBUTEROL SULFATE HFA 108 (90 BASE) MCG/ACT IN AERS
2.0000 | INHALATION_SPRAY | Freq: Once | RESPIRATORY_TRACT | Status: AC
  Administered 2014-08-21: 2 via RESPIRATORY_TRACT
  Filled 2014-08-21: qty 6.7

## 2014-08-21 MED ORDER — AZITHROMYCIN 250 MG PO TABS
500.0000 mg | ORAL_TABLET | Freq: Once | ORAL | Status: AC
  Administered 2014-08-21: 500 mg via ORAL
  Filled 2014-08-21: qty 2

## 2014-08-21 MED ORDER — AZITHROMYCIN 250 MG PO TABS: 250.0000 mg | ORAL_TABLET | Freq: Every day | ORAL | Status: DC

## 2014-08-21 MED ORDER — ALBUTEROL SULFATE (2.5 MG/3ML) 0.083% IN NEBU
5.0000 mg | INHALATION_SOLUTION | Freq: Once | RESPIRATORY_TRACT | Status: AC
  Administered 2014-08-21: 5 mg via RESPIRATORY_TRACT
  Filled 2014-08-21: qty 6

## 2014-08-21 NOTE — Discharge Instructions (Signed)

## 2014-08-21 NOTE — ED Notes (Signed)
Per EMS pt states she has had shortness of breath since yesterday morning that has progressively gotten worse  Pt states she has had a productive cough in the mornings with green sputum  Pt states she feels like she has been running a fever and has chills

## 2014-08-21 NOTE — ED Provider Notes (Signed)
CSN: [id number]     Arrival date & time 08/21/14  1928 History   First MD Initiated Contact with Patient 08/21/14 1929     Chief Complaint  Patient presents with  . Shortness of Breath  . Cough      Patient is a 73 y.o. female presenting with shortness of breath and cough. The history is provided by the patient. No language interpreter was used.  Shortness of Breath Severity:  Moderate Onset quality:  Gradual Duration:  2 days Timing:  Constant Progression:  Worsening Chronicity:  New Context: activity   Relieved by:  Nothing Worsened by:  Exertion and coughing Associated symptoms: cough and sputum production   Associated symptoms: no chest pain, no fever and no vomiting   Cough Associated symptoms: shortness of breath   Associated symptoms: no chest pain and no fever    Sonya Oliver presents for 2 days of progressive shortness of breath. She reports increased shortness of breath the mornings with green sputum production as well as needing additional pillows to sleep at night. She also has dyspnea on exertion. When EMS arrived to her house her sats were in the 80s on her home oxygen settings of 2 L. She reports recent weight gain but she states this is related to thyroid problems. She weighs herself daily and states that she was down today compared to yesterday. She has chronic lower extremity edema and this is at her baseline. She denies any history of pulmonary embolism and does not take any anticoagulants. She takes no exogenous estrogen, no history of recent surgery.   Past Medical History  Diagnosis Date  . Hypertension   . Thyroid disease HYPERTHYROIDISM--  NO LONGER ON MED. PT STATES BLOOD WORK BACK TO NORMAL  . 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 Arthritis   . H/O pulmonary edema JAN 2014  . Diabetes mellitus type II, controlled   . Dyspnea on exertion   . Nocturnal oxygen desaturation USES O2 HS VIA [place]  . 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  CHF (congestive heart failure) ACUTE CHF W/ PULM, EDEMA --  JAN 2014    NO CARDIOLOGIST---  MONITORED BY PCP DR Jacelyn Grip  . Nocturia   . COPD with emphysema    Past Surgical History  Procedure Laterality Date  . Cystoscopy  03/23/2012    Procedure: CYSTOSCOPY;  Surgeon: Claybon Jabs, MD;  Location: [facility];  Service: Urology;  Laterality: N/A;  . Transurethral resection of bladder tumor  03/23/2012    Procedure: TRANSURETHRAL RESECTION OF BLADDER TUMOR (TURBT);  Surgeon: Claybon Jabs, MD;  Location: [facility];  Service: Urology;  Laterality: N/A;  . Transthoracic echocardiogram  09-13-2012    MILDLY DILATED LV  &  RV/ LVSF NORMAL/ EF 55-60%/ MILD MR/ MILDLY DILATED LA  &  RA/ SYSTOLIC PRESSURE MODERATELY INCREASED PA  . Abdominal hysterectomy  1980'S    W/ BILATERAL SALPINGO-OOPHORECTOMY  . Transurethral resection of bladder tumor N/A 01/08/2013    Procedure: TRANSURETHRAL RESECTION OF BLADDER TUMOR (TURBT);  Surgeon: Claybon Jabs, MD;  Location: [facility];  Service: Urology;  Laterality: N/A;  . Cataract extraction w/phaco Left 01/31/2014    Procedure: CATARACT EXTRACTION PHACO AND INTRAOCULAR LENS PLACEMENT (IOC);  Surgeon: Tonny Branch, MD;  Location: [facility];  Service: Ophthalmology;  Laterality: Left;  CDE: 23.78  . Cataract extraction w/phaco Right 02/28/2014    Procedure: CATARACT EXTRACTION PHACO  AND INTRAOCULAR LENS PLACEMENT (IOC);  Surgeon: Tonny Branch, MD;  Location: [facility];  Service: Ophthalmology;  Laterality: Right;  CDE:  11.34   Family History  Problem Relation Age of Onset  . Diabetes Mother   . Diabetes Father    History  Substance Use Topics  . Smoking status: Former Smoker -- 1.00 packs/day for 50 years    Types: Cigarettes    Quit date: 09/24/2006  . Smokeless tobacco: Never Used  . Alcohol Use: No   OB History    No data available     Review of Systems  Constitutional: Negative for fever.  Respiratory: Positive for cough, sputum production and  shortness of breath.   Cardiovascular: Negative for chest pain.  Gastrointestinal: Negative for vomiting.  All other systems reviewed and are negative.     Allergies  Review of patient's allergies indicates no known allergies.  Home Medications   Prior to Admission medications   Medication Sig Start Date End Date Taking? Authorizing Provider  carvedilol (COREG) 3.125 MG tablet TAKE 1 TABLET BY MOUTH TWICE DAILY 04/30/14   Lysbeth Penner, FNP  Cholecalciferol (VITAMIN D3) 2000 UNITS TABS Take 1 tablet by mouth daily.    Historical Provider, MD  furosemide (LASIX) 40 MG tablet TAKE 1 TABLET BY MOUTH ONCE DAILY 04/30/14   Lysbeth Penner, FNP  metFORMIN (GLUCOPHAGE) 500 MG tablet 250 mg. TAKE 1 TABLET BY MOUTH DAILY 04/30/14   Lysbeth Penner, FNP  metolazone (ZAROXOLYN) 5 MG tablet TAKE 1 TABLET (5 MG TOTAL) BY MOUTH DAILY. AS NEEDED 5 LB WEIGHT GAIN 04/30/14   Chipper Herb, MD  potassium chloride SA (KLOR-CON M20) 20 MEQ tablet Take 1 tablet (20 mEq total) by mouth daily. 05/30/14   Wardell Honour, MD   BP 160/80 mmHg  Pulse 100  Temp(Src) 100.3 F (37.9 C) (Oral)  Resp 18  SpO2 93% Physical Exam  Constitutional: She is oriented to person, place, and time. She appears well-developed and well-nourished.  Mild distress  HENT:  Head: Normocephalic and atraumatic.  Cardiovascular: Normal rate and regular rhythm.   No murmur heard. Pulmonary/Chest: Effort normal.  Decreased air movement in bilateral bases  Abdominal: Soft. There is no tenderness. There is no rebound and no guarding.  Musculoskeletal: She exhibits no tenderness.  2+ nonpitting edema  Neurological: She is alert and oriented to person, place, and time.  Skin: Skin is warm and dry.  Psychiatric: She has a normal mood and affect. Her behavior is normal.  Nursing note and vitals reviewed.   ED Course  Procedures (including critical care time) Labs Review Labs Reviewed  BASIC METABOLIC PANEL - Abnormal; Notable  for the following:    Glucose, Bld 114 (*)    GFR calc non Af Amer 48 (*)    GFR calc Af Amer 56 (*)    All other components within normal limits  CBC WITH DIFFERENTIAL - Abnormal; Notable for the following:    Platelets 126 (*)    All other components within normal limits  TROPONIN I  BRAIN NATRIURETIC PEPTIDE    Imaging Review Dg Chest Port 1 View  08/21/2014   CLINICAL DATA:  Shortness of breath, productive cough and fever since yesterday. History COPD.  EXAM: PORTABLE CHEST - 1 VIEW  COMPARISON:  06/19/2013; 09/08/2012; chest CT- 03/22/2012  FINDINGS: Grossly unchanged enlarged cardiac silhouette and mediastinal contours with atherosclerotic plaque within the thoracic aorta. There is chronic thickening of the right paratracheal stripe secondary  to prominent vasculature as well as substernal extension of thyroid goiter as demonstrated on remote chest CT. The lungs remain hyperexpanded. Bilateral infrahilar heterogeneous opacities are unchanged and favored to represent atelectasis. No definitive new focal airspace opacities. No pleural effusion or pneumothorax. No definite evidence of edema. Unchanged bones.  IMPRESSION: 1. No definite acute cardiopulmonary disease on this [facility] portable examination. Further evaluation with a PA and lateral chest radiograph may be obtained as clinically indicated. 2. Similar findings of cardiomegaly, hyperexpanded lungs and bilateral infrahilar atelectasis.   Electronically Signed   By: Sandi Mariscal M.D.   On: 08/21/2014 20:19     EKG Interpretation   Date/Time:  Wednesday August 21 2014 19:51:33 EST Ventricular Rate:  93 PR Interval:  194 QRS Duration: 102 QT Interval:  368 QTC Calculation: 458 R Axis:   87 Text Interpretation:  Sinus rhythm Ventricular premature complex Anterior  infarct, old Confirmed by Hazle Coca [phone]) on 08/21/2014 7:56:16 PM      MDM   Final diagnoses:  COPD with acute exacerbation  Dyspnea    Patient here for  evaluation of dyspnea. Clinical picture is not consistent with ACS, CHF, pneumonia, PE. Patient is mildly diminished on lung exam, her symptoms are improved after albuterol nebulizer. On recheck she has no wheezes she is mildly diminished but has no respiratory distress. Discussed with patient home care for COPD exacerbation as well as very close return precautions if she has any new or worsening symptoms. Patient is able to ambulate in the department without difficulty and her dyspnea is well controlled. Discussed home care for dyspnea treating with short course of steroids, albuterol when necessary, azithromycin.    Quintella Reichert, MD 08/21/14 2220

## 2014-08-25 ENCOUNTER — Other Ambulatory Visit: Payer: Self-pay | Admitting: Family Medicine

## 2014-10-01 ENCOUNTER — Ambulatory Visit: Payer: Medicare Other | Admitting: Family Medicine

## 2014-10-03 ENCOUNTER — Other Ambulatory Visit: Payer: Self-pay | Admitting: Urology

## 2014-10-09 ENCOUNTER — Encounter (HOSPITAL_BASED_OUTPATIENT_CLINIC_OR_DEPARTMENT_OTHER): Payer: Self-pay | Admitting: *Deleted

## 2014-10-09 NOTE — Progress Notes (Signed)
NPO AFTER MN. ARRIVE AT 0900. NEEDS ISTAT.  CURRENT EKG AND CXR IN CHART AND EPIC. WILL TAKE COREG AM DOS W/ SIPS OF WATER AND BRING PROAIR INHALER .  ALSO, LIKE LAST TIME PT WILL BRING O2 TANK IF NEEDED TO GO HOME.

## 2014-10-14 ENCOUNTER — Encounter (HOSPITAL_BASED_OUTPATIENT_CLINIC_OR_DEPARTMENT_OTHER): Payer: Self-pay | Admitting: Anesthesiology

## 2014-10-14 ENCOUNTER — Ambulatory Visit (HOSPITAL_BASED_OUTPATIENT_CLINIC_OR_DEPARTMENT_OTHER): Admission: RE | Admit: 2014-10-14 | Payer: Medicare Other | Source: Ambulatory Visit | Admitting: Urology

## 2014-10-14 HISTORY — DX: Neoplasm of unspecified behavior of other genitourinary organ: D49.59

## 2014-10-14 HISTORY — DX: Type 2 diabetes mellitus without complications: E11.9

## 2014-10-14 HISTORY — DX: Thyrotoxicosis, unspecified without thyrotoxic crisis or storm: E05.90

## 2014-10-14 SURGERY — CYSTOURETEROSCOPY, WITH RETROGRADE PYELOGRAM AND STENT INSERTION
Anesthesia: General | Laterality: Right

## 2014-10-14 MED ORDER — FENTANYL CITRATE 0.05 MG/ML IJ SOLN
INTRAMUSCULAR | Status: AC
  Filled 2014-10-14: qty 4

## 2014-10-14 NOTE — H&P (Signed)
Sonya Oliver is a 74 year old female with a history of bladder cancer.   History of Present Illness Transitional cell carcinoma of the bladder: She developed hematuria and underwent a CT scan on 02/23/12. This revealed a 7 cm mass involving the floor of the bladder. Her CT scan was done without contrast however it did not reveal evidence of hydronephrosis or adenopathy. She continued to have gross hematuria and required hospitalization due to anemia. I therefore proceeded with transurethral resection of her bladder tumor on 03/21/12. At that time I found a tumor involving primarily the right wall of the bladder but also left wall and entire trigone.   Pathology: Papillary TCCa (Ta,G3)  She underwent an induction course of full-strength BCG completed in 10/13.  Recurrence 01/08/13 at the bladder neck 11:00 position  Pathology: Papillary TCCa (Ta,G3)  Treatment: Postoperative mitomycin C.    Urinary incontinence: She wears 3 pads per day. It is currently unclear whether this is stress or urge incontinence by her history.      Interval history: She is doing well with no new urologic complaints today. Specifically she's not having any new irritative symptoms or hematuria.   Past Medical History Problems  1. History of Arthritis 2. History of diabetes mellitus (Z86.39) 3. History of hypertension (Z86.79) 4. 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 History of Trichomoniasis Of The Bladder  Surgical History Problems  1. History of Bladder Injection Of Cancer Treatment 2. History of Cystoscopy With Biopsy 3. History of Cystoscopy With Fulguration Large Lesion (Over 5cm) 4. History of Cystoscopy With Fulguration Small Lesion (5-36mm) 5. History of Hysterectomy  Current Meds 1. Carvedilol 3.125 MG Oral Tablet;  Therapy: (Recorded:17Apr2014) to Recorded 2. Furosemide 40 MG Oral Tablet;  Therapy: (Recorded:17Apr2014) to Recorded 3. Klor-Con M20 TBCR;  Therapy:  (Recorded:17Apr2014) to Recorded 4. MetFORMIN HCl - 500 MG Oral Tablet;  Therapy: (Recorded:17Apr2014) to Recorded 5. Metolazone 5 MG Oral Tablet;  Therapy: 28Oct2014 to Recorded  Allergies Medication  1. No Known Drug Allergies  Family History Problems  1. Family history of diabetes mellitus (Z83.3) : Father 2. Family history of hypertension (Z82.49) : Mother, Father  Social History Problems  1. Denied: History of Alcohol Use 2. Caffeine Use 3. Former smoker [phone])   1ppdxmany years 4. Marital History - Widowed    Vitals Vital Signs  Height: 5 ft 6 in Weight: 170 lb  BMI Calculated: 27.44 BSA Calculated: 1.87 Blood Pressure: 139 / 69 Heart Rate: 77  Review of Systems Genitourinary, constitutional, skin, eye, otolaryngeal, hematologic/lymphatic, cardiovascular, pulmonary, endocrine, musculoskeletal, gastrointestinal, neurological and psychiatric system(s) were reviewed and pertinent findings if present are noted and are otherwise negative.    Physical Exam Constitutional: Well nourished and well developed . No acute distress.  ENT:. The ears and nose are normal in appearance.  Neck: The appearance of the neck is normal and no neck mass is present.  Pulmonary: No respiratory distress and normal respiratory rhythm and effort.  Cardiovascular: Heart rate and rhythm are normal . No peripheral edema.  Abdomen: The abdomen is soft and nontender. No masses are palpated. No CVA tenderness. No hernias are palpable. No hepatosplenomegaly noted.  Lymphatics: The femoral and inguinal nodes are not enlarged or tender.  Skin: Normal skin turgor, no visible rash and no visible skin lesions.  Neuro/Psych:. Mood and affect are appropriate.     Procedure  Procedure: Cystoscopy performed on 10/02/14  Chaperone Present: Sonia.  Indication: History of Urothelial Carcinoma.  Informed Consent:  Risks, benefits, and potential adverse events were discussed and informed consent was  obtained from the patient.  Prep: The patient was prepped with hibiclens.  Procedure Note:  Urethral meatus:. No abnormalities.  Anterior urethra: No abnormalities.  Bladder: Visulization was clear. The ureteral orifices were in the normal anatomic position bilaterally and had clear efflux of urine. A solitary tumor was visualized in the bladder. A papillary tumor was seen in the bladder. This tumor was located on the right side, near the trigone of the bladder. The patient tolerated the procedure well.  Complications: None.    Assessment   I found what appears to be a papillary recurrence involving the trigone and it appears to be entering into the right ureteral orifice which is somewhat patulous. We therefore have discussed treating this and evaluating the remainder of her distal ureter under anesthesia. Upon over the procedure with her in detail including its risks and complications as well as alternatives. She understands and is elected to proceed.      Plan She will be scheduled for cystoscopy, right retrograde pyelogram, right ureteroscopy and treatment of her right distal ureteral/ureteral orifice tumor with possible stent.

## 2014-10-14 NOTE — Anesthesia Preprocedure Evaluation (Deleted)
Anesthesia Evaluation  Patient identified by MRN, date of birth, ID band Patient awake    Reviewed: Allergy & Precautions, NPO status , Patient's Chart, lab work & pertinent test results  Airway        Dental   Pulmonary shortness of breath, COPDformer smoker,  Hx of Resp failure         Cardiovascular hypertension, +CHF  Ekg reviewed, ECHO 2014 noted , preserved LV function   Neuro/Psych negative neurological ROS     GI/Hepatic negative GI ROS, Neg liver ROS,   Endo/Other  diabetes, Well ControlledHyperthyroidism   Renal/GU      Musculoskeletal   Abdominal   Peds  Hematology   Anesthesia Other Findings   Reproductive/Obstetrics                             Anesthesia Physical Anesthesia Plan  ASA: III  Anesthesia Plan: General   Post-op Pain Management:    Induction: Intravenous  Airway Management Planned: LMA  Additional Equipment:   Intra-op Plan:   Post-operative Plan: Extubation in OR  Informed Consent: I have reviewed the patients History and Physical, chart, labs and discussed the procedure including the risks, benefits and alternatives for the proposed anesthesia with the patient or authorized representative who has indicated his/her understanding and acceptance.     Plan Discussed with: CRNA and Surgeon  Anesthesia Plan Comments:         Anesthesia Quick Evaluation

## 2014-10-15 ENCOUNTER — Other Ambulatory Visit: Payer: Self-pay | Admitting: Urology

## 2014-10-15 ENCOUNTER — Encounter (HOSPITAL_BASED_OUTPATIENT_CLINIC_OR_DEPARTMENT_OTHER): Payer: Self-pay | Admitting: *Deleted

## 2014-10-15 NOTE — Progress Notes (Signed)
Pt states no change since last hx obtained. Instructed to be npo p mn 2/21 x coreg w/ sip of water.  Bring inhaler w her as well as o2 tank for d/c if needed.  To [facility] 2/22 @ 0930.  Needs istat on arrival.

## 2014-10-21 ENCOUNTER — Encounter (HOSPITAL_BASED_OUTPATIENT_CLINIC_OR_DEPARTMENT_OTHER): Payer: Self-pay

## 2014-10-21 ENCOUNTER — Encounter (HOSPITAL_BASED_OUTPATIENT_CLINIC_OR_DEPARTMENT_OTHER)
Admission: RE | Disposition: A | Discharge status: Home / Self Care | Payer: Self-pay | Source: Ambulatory Visit | Attending: Urology

## 2014-10-21 ENCOUNTER — Ambulatory Visit (HOSPITAL_BASED_OUTPATIENT_CLINIC_OR_DEPARTMENT_OTHER)
Admission: RE | Admit: 2014-10-21 | Discharge: 2014-10-21 | Disposition: A | Discharge status: Home / Self Care | Payer: Medicare Other | Source: Ambulatory Visit | Attending: Urology | Admitting: Urology

## 2014-10-21 ENCOUNTER — Ambulatory Visit (HOSPITAL_BASED_OUTPATIENT_CLINIC_OR_DEPARTMENT_OTHER): Payer: Medicare Other | Admitting: Anesthesiology

## 2014-10-21 DIAGNOSIS — J449 Chronic obstructive pulmonary disease, unspecified: Secondary | ICD-10-CM | POA: Insufficient documentation

## 2014-10-21 DIAGNOSIS — D494 Neoplasm of unspecified behavior of bladder: Secondary | ICD-10-CM | POA: Diagnosis present

## 2014-10-21 DIAGNOSIS — E039 Hypothyroidism, unspecified: Secondary | ICD-10-CM | POA: Diagnosis not present

## 2014-10-21 DIAGNOSIS — M1990 Unspecified osteoarthritis, unspecified site: Secondary | ICD-10-CM | POA: Diagnosis not present

## 2014-10-21 DIAGNOSIS — C670 Malignant neoplasm of trigone of bladder: Secondary | ICD-10-CM | POA: Diagnosis not present

## 2014-10-21 DIAGNOSIS — I5033 Acute on chronic diastolic (congestive) heart failure: Secondary | ICD-10-CM | POA: Insufficient documentation

## 2014-10-21 DIAGNOSIS — E119 Type 2 diabetes mellitus without complications: Secondary | ICD-10-CM | POA: Diagnosis not present

## 2014-10-21 DIAGNOSIS — Z90710 Acquired absence of both cervix and uterus: Secondary | ICD-10-CM | POA: Diagnosis not present

## 2014-10-21 DIAGNOSIS — E0590 Thyrotoxicosis, unspecified without thyrotoxic crisis or storm: Secondary | ICD-10-CM | POA: Insufficient documentation

## 2014-10-21 DIAGNOSIS — N3020 Other chronic cystitis without hematuria: Secondary | ICD-10-CM | POA: Diagnosis not present

## 2014-10-21 DIAGNOSIS — I10 Essential (primary) hypertension: Secondary | ICD-10-CM | POA: Insufficient documentation

## 2014-10-21 DIAGNOSIS — R32 Unspecified urinary incontinence: Secondary | ICD-10-CM | POA: Insufficient documentation

## 2014-10-21 HISTORY — PX: CYSTOSCOPY WITH RETROGRADE PYELOGRAM, URETEROSCOPY AND STENT PLACEMENT: SHX5789

## 2014-10-21 LAB — POCT I-STAT 4, (NA,K, GLUC, HGB,HCT)
Glucose, Bld: 84 mg/dL (ref 70–99)
HCT: 46 % (ref 36.0–46.0)
Hemoglobin: 15.6 g/dL — ABNORMAL HIGH (ref 12.0–15.0)
Potassium: 3.5 mmol/L (ref 3.5–5.1)
Sodium: 139 mmol/L (ref 135–145)

## 2014-10-21 SURGERY — CYSTOURETEROSCOPY, WITH RETROGRADE PYELOGRAM AND STENT INSERTION
Anesthesia: General | Site: Ureter | Laterality: Right

## 2014-10-21 MED ORDER — HYDROCODONE-ACETAMINOPHEN 10-325 MG PO TABS: 1.0000 | ORAL_TABLET | ORAL | Status: DC | PRN

## 2014-10-21 MED ORDER — EPHEDRINE SULFATE 50 MG/ML IJ SOLN
INTRAMUSCULAR | Status: DC | PRN
  Administered 2014-10-21: 10 mg via INTRAVENOUS
  Administered 2014-10-21: 5 mg via INTRAVENOUS

## 2014-10-21 MED ORDER — IOHEXOL 350 MG/ML SOLN
INTRAVENOUS | Status: DC | PRN
  Administered 2014-10-21: 6 mL via URETHRAL

## 2014-10-21 MED ORDER — CIPROFLOXACIN IN D5W 200 MG/100ML IV SOLN
200.0000 mg | INTRAVENOUS | Status: AC
  Administered 2014-10-21: 200 mg via INTRAVENOUS
  Filled 2014-10-21: qty 100

## 2014-10-21 MED ORDER — CIPROFLOXACIN IN D5W 200 MG/100ML IV SOLN
INTRAVENOUS | Status: AC
  Filled 2014-10-21: qty 100

## 2014-10-21 MED ORDER — ONDANSETRON HCL 4 MG/2ML IJ SOLN
INTRAMUSCULAR | Status: DC | PRN
  Administered 2014-10-21: 4 mg via INTRAVENOUS

## 2014-10-21 MED ORDER — LACTATED RINGERS IV SOLN
INTRAVENOUS | Status: DC | PRN
  Administered 2014-10-21 (×2): via INTRAVENOUS

## 2014-10-21 MED ORDER — STERILE WATER FOR IRRIGATION IR SOLN
Status: DC | PRN
  Administered 2014-10-21: 3000 mL via INTRAVESICAL

## 2014-10-21 MED ORDER — PHENAZOPYRIDINE HCL 200 MG PO TABS: 200.0000 mg | ORAL_TABLET | Freq: Three times a day (TID) | ORAL | Status: DC | PRN

## 2014-10-21 MED ORDER — SODIUM CHLORIDE 0.9 % IR SOLN
Status: DC | PRN
  Administered 2014-10-21: 6000 mL via INTRAVESICAL

## 2014-10-21 MED ORDER — FENTANYL CITRATE 0.05 MG/ML IJ SOLN
INTRAMUSCULAR | Status: AC
  Filled 2014-10-21: qty 4

## 2014-10-21 MED ORDER — FUROSEMIDE 10 MG/ML IJ SOLN
INTRAMUSCULAR | Status: DC | PRN
  Administered 2014-10-21: 10 mg via INTRAMUSCULAR

## 2014-10-21 MED ORDER — PROMETHAZINE HCL 25 MG/ML IJ SOLN
6.2500 mg | INTRAMUSCULAR | Status: DC | PRN
  Filled 2014-10-21: qty 1

## 2014-10-21 MED ORDER — FENTANYL CITRATE 0.05 MG/ML IJ SOLN
25.0000 ug | INTRAMUSCULAR | Status: DC | PRN
  Filled 2014-10-21: qty 1

## 2014-10-21 MED ORDER — PHENAZOPYRIDINE HCL 100 MG PO TABS
ORAL_TABLET | ORAL | Status: AC
  Filled 2014-10-21: qty 2

## 2014-10-21 MED ORDER — LIDOCAINE HCL (CARDIAC) 20 MG/ML IV SOLN
INTRAVENOUS | Status: DC | PRN
  Administered 2014-10-21: 40 mg via INTRAVENOUS

## 2014-10-21 MED ORDER — FENTANYL CITRATE 0.05 MG/ML IJ SOLN
INTRAMUSCULAR | Status: DC | PRN
  Administered 2014-10-21 (×8): 12.5 ug via INTRAVENOUS

## 2014-10-21 MED ORDER — PHENAZOPYRIDINE HCL 200 MG PO TABS
200.0000 mg | ORAL_TABLET | Freq: Once | ORAL | Status: AC
  Administered 2014-10-21: 200 mg via ORAL
  Filled 2014-10-21: qty 1

## 2014-10-21 MED ORDER — CIPROFLOXACIN IN D5W 200 MG/100ML IV SOLN
200.0000 mg | INTRAVENOUS | Status: DC
  Filled 2014-10-21: qty 100

## 2014-10-21 MED ORDER — PROPOFOL 10 MG/ML IV BOLUS
INTRAVENOUS | Status: DC | PRN
  Administered 2014-10-21: 100 mg via INTRAVENOUS

## 2014-10-21 MED ORDER — ACETAMINOPHEN 10 MG/ML IV SOLN
INTRAVENOUS | Status: DC | PRN
  Administered 2014-10-21: 1000 mg via INTRAVENOUS

## 2014-10-21 MED ORDER — LACTATED RINGERS IV SOLN
INTRAVENOUS | Status: DC
  Administered 2014-10-21: 10:00:00 via INTRAVENOUS
  Filled 2014-10-21: qty 1000

## 2014-10-21 SURGICAL SUPPLY — 17 items
BAG DRAIN URO-CYSTO SKYTR STRL (DRAIN) ×3 IMPLANT
CANISTER SUCT LVC 12 LTR MEDI- (MISCELLANEOUS) ×3 IMPLANT
CATH INTERMIT  6FR 70CM (CATHETERS) ×3 IMPLANT
CLOTH BEACON ORANGE TIMEOUT ST (SAFETY) ×3 IMPLANT
ELECT REM PT RETURN 9FT ADLT (ELECTROSURGICAL) ×3
ELECTRODE REM PT RTRN 9FT ADLT (ELECTROSURGICAL) ×2 IMPLANT
GLOVE BIO SURGEON STRL SZ 6.5 (GLOVE) ×3 IMPLANT
GLOVE BIO SURGEON STRL SZ8 (GLOVE) ×3 IMPLANT
GLOVE BIOGEL PI IND STRL 6.5 (GLOVE) ×2 IMPLANT
GLOVE BIOGEL PI INDICATOR 6.5 (GLOVE) ×1
GOWN STRL REUS W/ TWL LRG LVL3 (GOWN DISPOSABLE) ×2 IMPLANT
GOWN STRL REUS W/TWL LRG LVL3 (GOWN DISPOSABLE) ×1
GOWN STRL REUS W/TWL XL LVL3 (GOWN DISPOSABLE) ×3 IMPLANT
GUIDEWIRE STR DUAL SENSOR (WIRE) ×3 IMPLANT
IV NS IRRIG 3000ML ARTHROMATIC (IV SOLUTION) ×6 IMPLANT
PACK CYSTO (CUSTOM PROCEDURE TRAY) ×3 IMPLANT
WATER STERILE IRR 3000ML UROMA (IV SOLUTION) ×3 IMPLANT

## 2014-10-21 NOTE — Anesthesia Preprocedure Evaluation (Addendum)
Anesthesia Evaluation  Patient identified by MRN, date of birth, ID band Patient awake  General Assessment Comment: Hypokalemia 08/22/2013 . Acute on chronic diastolic heart failure 02/63/7858 . Acute-on-chronic respiratory failure 06/20/2013 . CHF (congestive heart failure) 12/19/2012 . COPD (chronic obstructive pulmonary disease) 12/19/2012 . Unspecified vitamin D deficiency 12/19/2012 . HTN (hypertension) 12/19/2012 . SOB (shortness of breath) 09/12/2012 . Edema leg 09/12/2012 . Diabetes 09/12/2012 . Anemia 03/21/2012 . Syncope 03/21/2012 . Hematuria, gross 02/23/2012 . Anemia associated with acute blood loss 02/23/2012 . Orthostatic hypotension 02/23/2012 . Bladder mass 02/23/2012 . 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 No wheezing heard. Pulmonary exam normal + decreased breath sounds      Cardiovascular hypertension, Pt. on medications and Pt. on home beta blockers +CHF Rhythm:Regular Rate:Normal  ECHO 2014 with normal EF   Neuro/Psych negative neurological ROS  negative psych ROS   GI/Hepatic negative GI ROS, Neg liver ROS,   Endo/Other  diabetes, Type 2, Oral Hypoglycemic AgentsHyperthyroidism   Renal/GU negative Renal ROS  negative genitourinary   Musculoskeletal  (+) Arthritis -,   Abdominal   Peds negative pediatric ROS (+)  Hematology  (+) anemia ,   Anesthesia Other Findings   Reproductive/Obstetrics negative OB ROS                         Anesthesia Physical Anesthesia Plan  ASA: III  Anesthesia Plan: General   Post-op Pain Management:    Induction: Intravenous  Airway Management Planned: LMA  Additional Equipment:   Intra-op Plan:   Post-operative Plan: Extubation in OR  Informed Consent: I have reviewed the patients History and Physical, chart, labs and discussed the procedure including the risks, benefits and alternatives for the proposed anesthesia with the patient or authorized representative who has indicated his/her understanding and acceptance.   Dental advisory given  Plan Discussed with: CRNA  Anesthesia Plan Comments:         Anesthesia Quick Evaluation

## 2014-10-21 NOTE — Transfer of Care (Signed)
Immediate Anesthesia Transfer of Care Note  Patient: Sonya Oliver  Procedure(s) Performed: Procedure(s) (LRB): CYSTOSCOPY WITH RETROGRADE PYELOGRAM, URETEROSCOPY, BLADDER TUMOR BIOPSY (Right)  Patient Location: PACU  Anesthesia Type: General  Level of Consciousness: awake, sedated, patient cooperative and responds to stimulation  Airway & Oxygen Therapy: Patient Spontanous Breathing and Patient connected to face mask oxygen  Post-op Assessment: Report given to PACU RN, Post -op Vital signs reviewed and stable and Patient moving all extremities  Post vital signs: Reviewed and stable  Complications: No apparent anesthesia complications

## 2014-10-21 NOTE — Anesthesia Postprocedure Evaluation (Signed)
  Anesthesia Post-op Note  Patient: Sonya Oliver  Procedure(s) Performed: Procedure(s): CYSTOSCOPY WITH RETROGRADE PYELOGRAM, URETEROSCOPY, BLADDER TUMOR BIOPSY (Right)  Patient Location: PACU  Anesthesia Type:General  Level of Consciousness: awake and alert   Airway and Oxygen Therapy: Patient Spontanous Breathing  Post-op Pain: none  Post-op Assessment: Post-op Vital signs reviewed, Patient's Cardiovascular Status Stable and Respiratory Function Stable  Post-op Vital Signs: Reviewed  Filed Vitals:   10/21/14 1330  BP: 121/52  Pulse: 62  Temp:   Resp: 17    Complications: No apparent anesthesia complications

## 2014-10-21 NOTE — Interval H&P Note (Signed)
History and Physical Interval Note:  10/21/2014 7:18 AM  Sonya Oliver  has presented today for surgery, with the diagnosis of [facility] TUMOR  The various methods of treatment have been discussed with the patient and family. After consideration of risks, benefits and other options for treatment, the patient has consented to  Procedure(s) with comments: CYSTOSCOPY WITH RETROGRADE PYELOGRAM, URETEROSCOPY AND STENT PLACEMENT (Right) - POSSIBLE STENT     HOLMIUM LASER APPLICATION (Right) as a surgical intervention .  The patient's history has been reviewed, patient examined, no change in status, stable for surgery.  I have reviewed the patient's chart and labs.  Questions were answered to the patient's satisfaction.     Claybon Jabs

## 2014-10-21 NOTE — H&P (View-Only) (Signed)
Sonya Oliver is a 74 year old female with a history of bladder cancer.   History of Present Illness Transitional cell carcinoma of the bladder: She developed hematuria and underwent a CT scan on 02/23/12. This revealed a 7 cm mass involving the floor of the bladder. Her CT scan was done without contrast however it did not reveal evidence of hydronephrosis or adenopathy. She continued to have gross hematuria and required hospitalization due to anemia. I therefore proceeded with transurethral resection of her bladder tumor on 03/21/12. At that time I found a tumor involving primarily the right wall of the bladder but also left wall and entire trigone.   Pathology: Papillary TCCa (Ta,G3)  She underwent an induction course of full-strength BCG completed in 10/13.  Recurrence 01/08/13 at the bladder neck 11:00 position  Pathology: Papillary TCCa (Ta,G3)  Treatment: Postoperative mitomycin C.    Urinary incontinence: She wears 3 pads per day. It is currently unclear whether this is stress or urge incontinence by her history.      Interval history: She is doing well with no new urologic complaints today. Specifically she's not having any new irritative symptoms or hematuria.   Past Medical History Problems  1. History of Arthritis 2. History of diabetes mellitus (Z86.39) 3. History of hypertension (Z86.79) 4. 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 History of Trichomoniasis Of The Bladder  Surgical History Problems  1. History of Bladder Injection Of Cancer Treatment 2. History of Cystoscopy With Biopsy 3. History of Cystoscopy With Fulguration Large Lesion (Over 5cm) 4. History of Cystoscopy With Fulguration Small Lesion (5-35mm) 5. History of Hysterectomy  Current Meds 1. Carvedilol 3.125 MG Oral Tablet;  Therapy: (Recorded:17Apr2014) to Recorded 2. Furosemide 40 MG Oral Tablet;  Therapy: (Recorded:17Apr2014) to Recorded 3. Klor-Con M20 TBCR;  Therapy:  (Recorded:17Apr2014) to Recorded 4. MetFORMIN HCl - 500 MG Oral Tablet;  Therapy: (Recorded:17Apr2014) to Recorded 5. Metolazone 5 MG Oral Tablet;  Therapy: 28Oct2014 to Recorded  Allergies Medication  1. No Known Drug Allergies  Family History Problems  1. Family history of diabetes mellitus (Z83.3) : Father 2. Family history of hypertension (Z82.49) : Mother, Father  Social History Problems  1. Denied: History of Alcohol Use 2. Caffeine Use 3. Former smoker [phone])   1ppdxmany years 4. Marital History - Widowed    Vitals Vital Signs  Height: 5 ft 6 in Weight: 170 lb  BMI Calculated: 27.44 BSA Calculated: 1.87 Blood Pressure: 139 / 69 Heart Rate: 77  Review of Systems Genitourinary, constitutional, skin, eye, otolaryngeal, hematologic/lymphatic, cardiovascular, pulmonary, endocrine, musculoskeletal, gastrointestinal, neurological and psychiatric system(s) were reviewed and pertinent findings if present are noted and are otherwise negative.    Physical Exam Constitutional: Well nourished and well developed . No acute distress.  ENT:. The ears and nose are normal in appearance.  Neck: The appearance of the neck is normal and no neck mass is present.  Pulmonary: No respiratory distress and normal respiratory rhythm and effort.  Cardiovascular: Heart rate and rhythm are normal . No peripheral edema.  Abdomen: The abdomen is soft and nontender. No masses are palpated. No CVA tenderness. No hernias are palpable. No hepatosplenomegaly noted.  Lymphatics: The femoral and inguinal nodes are not enlarged or tender.  Skin: Normal skin turgor, no visible rash and no visible skin lesions.  Neuro/Psych:. Mood and affect are appropriate.     Procedure  Procedure: Cystoscopy performed on 10/02/14  Chaperone Present: Sonia.  Indication: History of Urothelial Carcinoma.  Informed Consent:  Risks, benefits, and potential adverse events were discussed and informed consent was  obtained from the patient.  Prep: The patient was prepped with hibiclens.  Procedure Note:  Urethral meatus:. No abnormalities.  Anterior urethra: No abnormalities.  Bladder: Visulization was clear. The ureteral orifices were in the normal anatomic position bilaterally and had clear efflux of urine. A solitary tumor was visualized in the bladder. A papillary tumor was seen in the bladder. This tumor was located on the right side, near the trigone of the bladder. The patient tolerated the procedure well.  Complications: None.    Assessment   I found what appears to be a papillary recurrence involving the trigone and it appears to be entering into the right ureteral orifice which is somewhat patulous. We therefore have discussed treating this and evaluating the remainder of her distal ureter under anesthesia. Upon over the procedure with her in detail including its risks and complications as well as alternatives. She understands and is elected to proceed.      Plan She will be scheduled for cystoscopy, right retrograde pyelogram, right ureteroscopy and treatment of her right distal ureteral/ureteral orifice tumor with possible stent.

## 2014-10-21 NOTE — Op Note (Signed)
PATIENT:  Sonya Oliver  PRE-OPERATIVE DIAGNOSIS: Bladder tumors  POST-OPERATIVE DIAGNOSIS: Same  PROCEDURE: 1. Cystoscopy with bladder biopsy and fulguration of bladder tumors (5 mm) 2. Right retrograde pyelogram with interpretation. 3. Right diagnostic ureteroscopy.  SURGEON:  Claybon Jabs  INDICATION: Sonya Oliver is a 74 year old female with a history of transitional cell carcinoma initially resected in 7/13. It was a Ta,G3 lesion. It involved the left wall and entire trigone. She underwent an induction course of full-strength BCG and then had a recurrence in 5/14 at the bladder neck there was the same grade and stage. Postoperatively she received mitomycin-C. She was recently found to have a very small papillary recurrence involving the right ureteral orifice. She is brought to the operating room today for management of this lesion.  ANESTHESIA:  General  EBL:  Minimal  DRAINS: None  LOCAL MEDICATIONS USED:  None  SPECIMEN:  Biopsies from the area of the right ureteral orifice and into the left trigone  Description of procedure: After informed consent the patient was taken to the operating room and placed on the table in a supine position. General anesthesia was then administered. Once fully anesthetized the patient was moved to the dorsal lithotomy position and the genitalia were sterilely prepped and draped in standard fashion. An official timeout was then performed.  The 8 French cystoscope with 30 lens was then passed in the bladder and the bladder was fully and systematically inspected. I noted the area involving the 6:00 position of the right ureteral orifice and I also found an area on the left side of the trigone medial to the left ureteral orifice that appeared papillary and suspicious for recurrence as well.  A 6 French open-ended ureteral catheter was then passed through the cystoscope and into the right ureteral orifice. A right retrograde pyelogram was then  performed in the standard fashion by injecting full-strength Omnipaque contrast through the ureteral catheter and up the right ureter using approximately 5 mL of contrast. This revealed no definite filling defect within the ureter throughout its course with no abnormality of the intrarenal collecting system.  I then inserted the 6 French rigid ureteroscope and passed this into the right ureteral orifice beyond what appeared to be a small papillary tumor involving the opening of the orifice but proximal to this the ureter was entirely normal. I used the ureteroscopic biopsy forceps to obtain specimens although they were very tiny. I felt fulguration was the best way to manage this lesion. I therefore chose a Bugbee electrode and using settings of 1 for depth and 5 W I fulgurated the tumor with the flow through the ureteroscope distending up the ureteral orifice so that the Bugbee did not contact the side or anterior wall of the ureteral orifice and just fulgurated and obliterated the tumor that was identified in that location. I was able to fully obliterate the lesion without injury to the ureteral orifice.  The lesion on the trigone on the left-hand side was then biopsied with the cold cup biopsy forceps and I then used the Bugbee electrode to fulgurate all of the area that appeared suspicious. Once this was complete there were no further worrisome lesions within the bladder and the bladder was reinspected a second time completely with no other worrisome lesions having been identified. The bladder was therefore drained, the cystoscope was removed and the patient was awakened and taken to the recovery room in stable and satisfactory condition. She tolerated procedure well with no intraoperative complications.  PLAN OF CARE: Discharge to home after PACU  PATIENT DISPOSITION:  PACU - hemodynamically stable.

## 2014-10-21 NOTE — Anesthesia Procedure Notes (Signed)
Procedure Name: LMA Insertion Date/Time: 10/21/2014 12:02 PM Performed by: Justice Rocher Pre-anesthesia Checklist: Patient identified, Emergency Drugs available, Suction available and Patient being monitored Patient Re-evaluated:Patient Re-evaluated prior to inductionOxygen Delivery Method: Circle System Utilized Preoxygenation: Pre-oxygenation with 100% oxygen Intubation Type: IV induction Ventilation: Mask ventilation without difficulty LMA: LMA inserted LMA Size: 4.0 Number of attempts: 1 Airway Equipment and Method: Bite block Placement Confirmation: positive ETCO2 Tube secured with: Tape Dental Injury: Teeth and Oropharynx as per pre-operative assessment

## 2014-10-21 NOTE — Discharge Instructions (Signed)

## 2014-10-22 ENCOUNTER — Encounter (HOSPITAL_BASED_OUTPATIENT_CLINIC_OR_DEPARTMENT_OTHER): Payer: Self-pay | Admitting: Urology

## 2014-10-25 ENCOUNTER — Ambulatory Visit (INDEPENDENT_AMBULATORY_CARE_PROVIDER_SITE_OTHER): Payer: Medicare Other | Admitting: Family Medicine

## 2014-10-25 ENCOUNTER — Encounter: Payer: Self-pay | Admitting: Family Medicine

## 2014-10-25 VITALS — BP 116/61 | HR 74 | Temp 97.0°F | Ht 66.0 in | Wt 178.0 lb

## 2014-10-25 DIAGNOSIS — E876 Hypokalemia: Secondary | ICD-10-CM

## 2014-10-25 DIAGNOSIS — E118 Type 2 diabetes mellitus with unspecified complications: Secondary | ICD-10-CM

## 2014-10-25 DIAGNOSIS — I10 Essential (primary) hypertension: Secondary | ICD-10-CM

## 2014-10-25 DIAGNOSIS — E0590 Thyrotoxicosis, unspecified without thyrotoxic crisis or storm: Secondary | ICD-10-CM

## 2014-10-25 LAB — POCT UA - MICROALBUMIN: Microalbumin Ur, POC: 20 mg/L

## 2014-10-25 LAB — POCT GLYCOSYLATED HEMOGLOBIN (HGB A1C): Hemoglobin A1C: 6.1

## 2014-10-25 MED ORDER — FUROSEMIDE 40 MG PO TABS: ORAL_TABLET | ORAL | Status: DC

## 2014-10-25 MED ORDER — ALBUTEROL SULFATE HFA 108 (90 BASE) MCG/ACT IN AERS: 1.0000 | INHALATION_SPRAY | Freq: Four times a day (QID) | RESPIRATORY_TRACT | Status: DC | PRN

## 2014-10-25 NOTE — Progress Notes (Signed)
   Subjective:    Patient ID: Sonya Oliver, female    DOB: 05-12-1941, 74 y.o.   MRN: [id number]  HPI  74 year old lady with diabetes, COPD, hypertension, dependent edema, and recurring bladder tumor. She sees endocrinologist for her hyperthyroid condition which do not really understand what I'm willing to let the endocrine specialist follow her for that. Her sugars have been doing well at home when she checks them. Her only medication for diabetes is metformin. She does have dependent edema and takes Lasix 40 mg a day along with potassium and that controls her symptoms fairly well. She denies chest pain. She does have some wheezing when she exerts herself and uses as needed albuterol.  Patient Active Problem List   Diagnosis Date Noted  . Hypokalemia 08/22/2013  . Acute on chronic diastolic heart failure 68/07/7516  . Acute-on-chronic respiratory failure 06/20/2013  . CHF (congestive heart failure) 12/19/2012  . COPD (chronic obstructive pulmonary disease) 12/19/2012  . Unspecified vitamin D deficiency 12/19/2012  . HTN (hypertension) 12/19/2012  . SOB (shortness of breath) 09/12/2012  . Edema leg 09/12/2012  . Diabetes 09/12/2012  . Anemia 03/21/2012  . Syncope 03/21/2012  . Hematuria, gross 02/23/2012  . Anemia associated with acute blood loss 02/23/2012  . Orthostatic hypotension 02/23/2012  . Bladder mass 02/23/2012  . Hyperthyroidism 02/23/2012   Outpatient Encounter Prescriptions as of 10/25/2014  Medication Sig  . albuterol (PROVENTIL HFA;VENTOLIN HFA) 108 (90 BASE) MCG/ACT inhaler Inhale 1-2 puffs into the lungs every 6 (six) hours as needed for wheezing or shortness of breath.  . carvedilol (COREG) 3.125 MG tablet TAKE 1 TABLET BY MOUTH TWICE DAILY  . Cholecalciferol (VITAMIN D3) 2000 UNITS TABS Take 1 tablet by mouth daily.  . furosemide (LASIX) 40 MG tablet TAKE 1 TABLET BY MOUTH ONCE DAILY  . HYDROcodone-acetaminophen (NORCO) 10-325 MG per tablet Take 1-2 tablets by  mouth every 4 (four) hours as needed for moderate pain. Maximum dose per 24 hours - 8 pills  . metFORMIN (GLUCOPHAGE) 500 MG tablet Take 500 mg by mouth daily. TAKE 1 TABLET BY MOUTH DAILY  . potassium chloride SA (KLOR-CON M20) 20 MEQ tablet Take 1 tablet (20 mEq total) by mouth daily.  . [DISCONTINUED] furosemide (LASIX) 40 MG tablet TAKE 1 TABLET BY MOUTH ONCE DAILY  . [DISCONTINUED] phenazopyridine (PYRIDIUM) 200 MG tablet Take 1 tablet (200 mg total) by mouth 3 (three) times daily as needed for pain.     Review of Systems  Constitutional: Negative.   HENT: Negative.   Respiratory: Positive for wheezing.   Cardiovascular: Positive for leg swelling.  Genitourinary: Negative.   Neurological: Negative.   Psychiatric/Behavioral: Negative.        Objective:   Physical Exam  Constitutional: She is oriented to person, place, and time. She appears well-developed and well-nourished.  Cardiovascular: Normal rate and regular rhythm.   Pulmonary/Chest: Effort normal and breath sounds normal.  Abdominal: Soft.  Musculoskeletal: Normal range of motion.  Neurological: She is alert and oriented to person, place, and time.    BP 116/61 mmHg  Pulse 74  Temp(Src) 97 F (36.1 C) (Oral)  Ht 5\' 6"  (1.676 m)  Wt 178 lb (80.74 kg)  BMI 28.74 kg/m2       Assessment & Plan:  1. Hypokalemia   2. Hyperthyroidism   3. Type 2 diabetes mellitus with complication  - POCT glycosylated hemoglobin (Hb A1C) - POCT UA - Microalbumin  4. Essential hypertension  Wardell Honour MD

## 2014-10-25 NOTE — Addendum Note (Signed)
Addended by: Ilean China on: 10/25/2014 02:24 PM   Modules accepted: Orders

## 2014-10-26 LAB — BMP8+EGFR
BUN/Creatinine Ratio: 18 (ref 11–26)
BUN: 15 mg/dL (ref 8–27)
CALCIUM: 9.8 mg/dL (ref 8.7–10.3)
CO2: 31 mmol/L — ABNORMAL HIGH (ref 18–29)
CREATININE: 0.85 mg/dL (ref 0.57–1.00)
Chloride: 94 mmol/L — ABNORMAL LOW (ref 97–108)
GFR calc Af Amer: 78 mL/min/{1.73_m2} (ref >59)
GFR calc non Af Amer: 67 mL/min/{1.73_m2} (ref >59)
Glucose: 98 mg/dL (ref 65–99)
Potassium: 3.9 mmol/L (ref 3.5–5.2)
Sodium: 140 mmol/L (ref 134–144)

## 2014-12-23 ENCOUNTER — Other Ambulatory Visit: Payer: Self-pay | Admitting: *Deleted

## 2014-12-23 MED ORDER — METFORMIN HCL 500 MG PO TABS: 500.0000 mg | ORAL_TABLET | Freq: Every day | ORAL | Status: DC

## 2015-01-13 ENCOUNTER — Other Ambulatory Visit: Payer: Self-pay | Admitting: Family Medicine

## 2015-02-13 ENCOUNTER — Encounter: Payer: Self-pay | Admitting: Family Medicine

## 2015-02-13 ENCOUNTER — Ambulatory Visit (INDEPENDENT_AMBULATORY_CARE_PROVIDER_SITE_OTHER): Payer: Medicare Other | Admitting: Family Medicine

## 2015-02-13 VITALS — BP 150/63 | HR 58 | Temp 97.5°F | Ht 66.0 in | Wt 182.0 lb

## 2015-02-13 DIAGNOSIS — J449 Chronic obstructive pulmonary disease, unspecified: Secondary | ICD-10-CM

## 2015-02-13 DIAGNOSIS — E118 Type 2 diabetes mellitus with unspecified complications: Secondary | ICD-10-CM | POA: Diagnosis not present

## 2015-02-13 DIAGNOSIS — I10 Essential (primary) hypertension: Secondary | ICD-10-CM | POA: Diagnosis not present

## 2015-02-13 DIAGNOSIS — E876 Hypokalemia: Secondary | ICD-10-CM

## 2015-02-13 LAB — POCT GLYCOSYLATED HEMOGLOBIN (HGB A1C): Hemoglobin A1C: 6.1

## 2015-02-13 NOTE — Progress Notes (Signed)
Subjective:    Patient ID: Sonya Oliver, female    DOB: 08/12/1941, 74 y.o.   MRN: [id number]  HPI 74 year old female with diabetes and hypertension and congestive heart failure. She does not monitor her sugar as she needs another glucometer. She is followed by insurance home health with frequent weights. Her weight is up 12 pounds since last visit but she is not edematous. She denies shortness of breath or chest pain. She does have some pain or tingling in her legs at night but it is not bad enough to warrant another pill.  Patient Active Problem List   Diagnosis Date Noted  . Hypokalemia 08/22/2013  . Acute on chronic diastolic heart failure 56/25/6389  . Acute-on-chronic respiratory failure 06/20/2013  . CHF (congestive heart failure) 12/19/2012  . COPD (chronic obstructive pulmonary disease) 12/19/2012  . Unspecified vitamin D deficiency 12/19/2012  . HTN (hypertension) 12/19/2012  . SOB (shortness of breath) 09/12/2012  . Edema leg 09/12/2012  . Diabetes 09/12/2012  . Anemia 03/21/2012  . Syncope 03/21/2012  . Hematuria, gross 02/23/2012  . Anemia associated with acute blood loss 02/23/2012  . Orthostatic hypotension 02/23/2012  . Bladder mass 02/23/2012  . Hyperthyroidism 02/23/2012   Outpatient Encounter Prescriptions as of 02/13/2015  Medication Sig  . albuterol (PROVENTIL HFA;VENTOLIN HFA) 108 (90 BASE) MCG/ACT inhaler Inhale 1-2 puffs into the lungs every 6 (six) hours as needed for wheezing or shortness of breath.  . carvedilol (COREG) 3.125 MG tablet TAKE 1 TABLET BY MOUTH TWICE DAILY  . Cholecalciferol (VITAMIN D3) 2000 UNITS TABS Take 1 tablet by mouth daily.  . furosemide (LASIX) 40 MG tablet TAKE 1 TABLET BY MOUTH ONCE DAILY  . HYDROcodone-acetaminophen (NORCO) 10-325 MG per tablet Take 1-2 tablets by mouth every 4 (four) hours as needed for moderate pain. Maximum dose per 24 hours - 8 pills  . metFORMIN (GLUCOPHAGE) 500 MG tablet Take 1 tablet (500 mg total) by  mouth daily.  . potassium chloride SA (KLOR-CON M20) 20 MEQ tablet Take 1 tablet (20 mEq total) by mouth daily.   No facility-administered encounter medications on file as of 02/13/2015.       Review of Systems  Constitutional: Negative.   Respiratory: Negative.   Cardiovascular: Negative.   Genitourinary: Negative.   Neurological: Negative.   Psychiatric/Behavioral: Negative.        Objective:   Physical Exam  Constitutional: She is oriented to person, place, and time. She appears well-developed and well-nourished.  Cardiovascular: Normal rate and regular rhythm.   Pulmonary/Chest: Effort normal.  Neurological: She is alert and oriented to person, place, and time.  Psychiatric: She has a normal mood and affect.     BP 150/63 mmHg  Pulse 58  Temp(Src) 97.5 F (36.4 C) (Oral)  Ht '5\' 6"'$  (1.676 m)  Wt 182 lb (82.555 kg)  BMI 29.39 kg/m2      Assessment & Plan:  1. Essential hypertension Blood pressure is up a little bit today but I do not feel it warrants change in her medication. I note that she is not on an Ace or arb.  Might consider addition of that since her seems. Also note that she had been on losartan previously  2. Hypokalemia Past history  3. Type 2 diabetes mellitus with complication She is not symptomatic such as with polyuria polydipsia and even though her sugars have not been monitored her last A1c was at goal at 6.1 on metformin only - POCT glycosylated hemoglobin (Hb A1C)  4. Chronic obstructive pulmonary disease, unspecified COPD, unspecified chronic bronchitis type As albuterol inhaler to use as needed  Wardell Honour MD

## 2015-02-14 ENCOUNTER — Telehealth: Payer: Self-pay

## 2015-02-14 NOTE — Telephone Encounter (Signed)
-----   Message from Wardell Honour, MD sent at 02/14/2015  7:53 AM EDT ----- Hemoglobin A1c is excellent. No changes are recommended

## 2015-02-14 NOTE — Telephone Encounter (Signed)
[facility]

## 2015-02-19 ENCOUNTER — Encounter: Payer: Self-pay | Admitting: *Deleted

## 2015-02-26 ENCOUNTER — Ambulatory Visit: Payer: Medicare Other | Admitting: *Deleted

## 2015-03-07 ENCOUNTER — Ambulatory Visit (INDEPENDENT_AMBULATORY_CARE_PROVIDER_SITE_OTHER): Payer: Medicare Other | Admitting: *Deleted

## 2015-03-07 ENCOUNTER — Encounter: Payer: Self-pay | Admitting: *Deleted

## 2015-03-07 ENCOUNTER — Ambulatory Visit (INDEPENDENT_AMBULATORY_CARE_PROVIDER_SITE_OTHER): Payer: Medicare Other

## 2015-03-07 VITALS — BP 132/68 | HR 60 | Ht 65.0 in | Wt 178.0 lb

## 2015-03-07 DIAGNOSIS — Z780 Asymptomatic menopausal state: Secondary | ICD-10-CM

## 2015-03-07 DIAGNOSIS — Z0000 Encounter for general adult medical examination without abnormal findings: Secondary | ICD-10-CM

## 2015-03-07 DIAGNOSIS — Z23 Encounter for immunization: Secondary | ICD-10-CM

## 2015-03-07 DIAGNOSIS — Z1231 Encounter for screening mammogram for malignant neoplasm of breast: Secondary | ICD-10-CM

## 2015-03-07 NOTE — Progress Notes (Signed)
Subjective:   Sonya Oliver is a 74 y.o. female who presents for an Initial Medicare Annual Wellness Visit. Sonya Oliver is widowed and lives at home. She has 4 children, 7 grandchildren and 7 great grandchildren. She is retired from the [employer]. She uses a walker to assist with ambulation.   Review of Systems     Cardiac Risk Factors include: advanced age (>41mn, >>51women);diabetes mellitus;sedentary lifestyle;obesity (BMI >30kg/m2)   Musculoskeletal: Left knee pain. Has been evaluated by ortho. 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 Surgery is the best option but she elects not to do that at this time.      Objective:    Today's Vitals   03/07/15 1120  BP: 132/68  Pulse: 60  Height: '5\' 5"'$  (1.651 m)  Weight: 178 lb (80.74 kg)    Current Medications (verified) Outpatient Encounter Prescriptions as of 03/07/2015  Medication Sig  . albuterol (PROVENTIL HFA;VENTOLIN HFA) 108 (90 BASE) MCG/ACT inhaler Inhale 1-2 puffs into the lungs every 6 (six) hours as needed for wheezing or shortness of breath.  . carvedilol (COREG) 3.125 MG tablet TAKE 1 TABLET BY MOUTH TWICE DAILY  . Cholecalciferol (VITAMIN D3) 2000 UNITS TABS Take 1 tablet by mouth daily.  . furosemide (LASIX) 40 MG tablet TAKE 1 TABLET BY MOUTH ONCE DAILY  . metFORMIN (GLUCOPHAGE) 500 MG tablet Take 1 tablet (500 mg total) by mouth daily.  . potassium chloride SA (KLOR-CON M20) 20 MEQ tablet Take 1 tablet (20 mEq total) by mouth daily.  . [DISCONTINUED] HYDROcodone-acetaminophen (NORCO) 10-325 MG per tablet Take 1-2 tablets by mouth every 4 (four) hours as needed for moderate pain. Maximum dose per 24 hours - 8 pills   No facility-administered encounter medications on file as of 03/07/2015.    Allergies (verified) Review of patient's allergies indicates no known allergies.   History: Past Medical History  Diagnosis Date  . Hypertension   . Bladder tumor   . Arthritis   . H/O pulmonary edema     jan 2014--  acute  CHF w/ pulmonary edema  . Dyspnea on exertion   . Nocturnal oxygen desaturation     uses O2  HS via --  and PRN daytime  . Nocturia   . Type 2 diabetes mellitus   . Ureteral tumor   . CHF (congestive heart failure)     NO CARDIOLOGIST---  MONITORED BY PCP DR WJacelyn Grip . Hyperthyroidism     currently no meds -- labs stable  . COPD with emphysema     last Acute Exacerbation 08-21-2014   Past Surgical History  Procedure Laterality Date  . Cystoscopy  03/23/2012    Procedure: CYSTOSCOPY;  Surgeon: MClaybon Jabs MD;  Location: [facility];  Service: Urology;  Laterality: N/A;  . Transurethral resection of bladder tumor  03/23/2012    Procedure: TRANSURETHRAL RESECTION OF BLADDER TUMOR (TURBT);  Surgeon: MClaybon Jabs MD;  Location: [facility];  Service: Urology;  Laterality: N/A;  . Transthoracic echocardiogram  06-21-2013    moderate LVH,  grade I diastolic dysfunction, ef 693-81%  mild MR & TR  . Transurethral resection of bladder tumor N/A 01/08/2013    Procedure: TRANSURETHRAL RESECTION OF BLADDER TUMOR (TURBT);  Surgeon: MClaybon Jabs MD;  [facility]  Service: Urology;  Laterality: N/A;  . Cataract extraction w/phaco Left 01/31/2014    Procedure: CATARACT EXTRACTION PHACO AND INTRAOCULAR LENS PLACEMENT (IOC);  Surgeon: KTonny Branch MD;  Location: [facility];  Service: Ophthalmology;  Laterality: Left;  CDE: 23.78  . Cataract extraction w/phaco Right 02/28/2014    Procedure: CATARACT EXTRACTION PHACO AND INTRAOCULAR LENS PLACEMENT (IOC);  Surgeon: Tonny Branch, MD;  Location: [facility];  Service: Ophthalmology;  Laterality: Right;  CDE:  11.34  . Abdominal hysterectomy  1980'S    W/ BILATERAL SALPINGO-OOPHORECTOMY  . Cystoscopy with retrograde pyelogram, ureteroscopy and stent placement Right 10/21/2014    Procedure: CYSTOSCOPY WITH RETROGRADE PYELOGRAM, URETEROSCOPY, BLADDER TUMOR BIOPSY;  Surgeon: Claybon Jabs, MD;  Location: [place];  Service: Urology;   Laterality: Right;   Family History  Problem Relation Age of Onset  . Diabetes Mother   . Diabetes Father    Social History   Occupational History  . Retired Risk analyst    Social History Main Topics  . Smoking status: Former Smoker -- 1.00 packs/day for 50 years    Types: Cigarettes    Quit date: 09/24/2006  . Smokeless tobacco: Never Used  . Alcohol Use: No  . Drug Use: No  . Sexual Activity: Not Currently    Activities of Daily Living In your present state of health, do you have any difficulty performing the following activities: 03/07/2015 10/21/2014  Hearing? N N  Vision? N N  Difficulty concentrating or making decisions? Y N  Walking or climbing stairs? Y Y  Dressing or bathing? N N  Doing errands, shopping? N -  Using the Toilet? N -  In the past six months, have you accidently leaked urine? Y -  Do you have problems with loss of bowel control? N -  Managing your Medications? N -  Managing your Finances? N -  Housekeeping or managing your Housekeeping? N -  -Reports some difficulty with short term memory. This is more annoying to her than actually disruptive to her daily life.  -Difficulty climbing stairs due to knee pain and need for walker. She avoids stairs.  -Some stress incontinence. She has addressed this with her urologist. Suggested Kegel exercises.   Immunizations and Health Maintenance Immunization History  Administered Date(s) Administered  . Influenza,inj,Quad PF,36+ Mos 06/20/2013, 05/30/2014  . Pneumococcal Conjugate-13 03/07/2015  . Pneumococcal Polysaccharide-23 04/26/2011  . Tdap 04/26/2011  . Zoster 04/16/2013   Health Maintenance Due  Topic Date Due  . FOOT EXAM  07/11/1951  . PNA vac Low Risk Adult (2 of 2 - PCV13) 03/30/2012  . MAMMOGRAM  02/08/2014  . OPHTHALMOLOGY EXAM  11/01/2014    Patient Care Team: Wardell Honour, MD as PCP - General (Family Medicine) Kathie Rhodes, MD as Consulting Physician (Urology) Okey Regal, [place]  (Optometry)   Indicate any recent Medical Services you may have received from other than [facility] providers in the past year (date may be approximate). -Visit with Dr Karsten Ro around 10/2014     Assessment:   This is a routine wellness examination for Sonya Oliver.   Hearing/Vision screen No hearing or vision deficits noted during visit today.   Dietary issues and exercise activities discussed: Current Exercise Habits:: Exercise is limited by, Limited by:: orthopedic condition(s);Other - see comments (general weakness, and left knee pain)  Goals    . Exercise 3x per week (30 min per time)     Low impact seated exercises      Depression Screen PHQ 2/9 Scores 03/07/2015 02/13/2015 05/30/2014 01/08/2014 12/21/2013  PHQ - 2 Score 5 5 0 0 0  PHQ- 9 Score 16 15 - - -    Fall Risk Fall Risk  03/07/2015 02/13/2015 05/30/2014 01/08/2014  Falls in the past year? No No No No  Risk for fall due to : Impaired mobility - Impaired mobility -    Cognitive Function: MMSE - Mini Mental State Exam 03/07/2015  Orientation to time 5  Orientation to Place 5  Registration 3  Attention/ Calculation 5  Recall 3  Language- name 2 objects 2  Language- repeat 1  Language- follow 3 step command 3  Language- read & follow direction 1  Write a sentence 1  Total              30 No apparent deficit  Screening Tests Health Maintenance  Topic Date Due  . FOOT EXAM  07/11/1951  . PNA vac Low Risk Adult (2 of 2 - PCV13) 03/30/2012  . MAMMOGRAM  02/08/2014  . OPHTHALMOLOGY EXAM  11/01/2014  . INFLUENZA VACCINE  03/31/2015  . HEMOGLOBIN A1C  08/15/2015  . URINE MICROALBUMIN  10/26/2015  . TETANUS/TDAP  03/30/2021  . COLONOSCOPY  06/30/2022  . DEXA SCAN  Completed  . ZOSTAVAX  Completed      Plan:  Move carefully to avoid falls. Continue current medications. Keep follow up appointments. We will call you when your mammogram is scheduled.  Have your eye doctor send a copy of your report to our office once you are  seen. Bring copy of Power of Attorney paperwork to our office. Suggested seated exercises and instructional handout given.   During the course of the visit, Bird was educated and counseled about the following appropriate screening and preventive services:   Vaccines to include Pneumoccal-up to date. Prevnar given today, Influenza-suggested annually, Tdap-up to date, Zostavax-up to date  Electrocardiogram-done 08/22/14  Colorectal cancer screening-colonoscopy done 06/30/12. Needs FOBT at next visit.   Bone density screening-done today. Shows progression to osteoporosis. Will have Dr Sabra Heck review next week and we will contact patient with plan of care.   Glaucoma screening-Patient is going by the eye doctor today to schedule an appointment. She will have them forward a copy of the report to our office.   Mammography-order placed for [facility].   Nutrition counseling-Balanced diet with lean proteins, vegetables, and fruits that aren't overly ripe.    Patient Instructions (the written plan) were given to the patient.    Chong Sicilian, RN   03/07/2015      I have reviewed and agree with the above AWV documentation.  Claretta Fraise, M.D.

## 2015-03-07 NOTE — Patient Instructions (Addendum)
Move carefully to avoid falls. Continue current medications. Keep follow up appointments. We will call you when your mammogram is scheduled.  Have your eye doctor send a copy of your report to our office once you are seen. Bring copy of Power of Attorney paperwork to our office.  Pneumococcal Vaccine, Polyvalent suspension for injection What is this medicine? PNEUMOCOCCAL VACCINE, POLYVALENT (NEU mo KOK al vak SEEN, pol ee VEY luhnt) is a vaccine to prevent pneumococcus bacteria infection. These bacteria are a major cause of ear infections, 'Strep throat' infections, and serious pneumonia, meningitis, or blood infections worldwide. These vaccines help the body to produce antibodies (protective substances) that help your body defend against these bacteria. This vaccine is recommended for infants and young children. This vaccine will not treat an infection. This medicine may be used for other purposes; ask your health care provider or pharmacist if you have questions. COMMON BRAND NAME(S): Prevnar 13 What should I tell my health care provider before I take this medicine? They need to know if you have any of these conditions: -bleeding problems -fever -immune system problems -low platelet count in the blood -seizures -an unusual or allergic reaction to pneumococcal vaccine, diphtheria toxoid, other vaccines, latex, other medicines, foods, dyes, or preservatives -pregnant or trying to get pregnant -breast-feeding How should I use this medicine? This vaccine is for injection into a muscle. It is given by a health care professional. A copy of Vaccine Information Statements will be given before each vaccination. Read this sheet carefully each time. The sheet may change frequently. Talk to your pediatrician regarding the use of this medicine in children. While this drug may be prescribed for children as young as 72 weeks old for selected conditions, precautions do apply. Overdosage: If you think you  have taken too much of this medicine contact a poison control center or emergency room at once. NOTE: This medicine is only for you. Do not share this medicine with others. What if I miss a dose? It is important not to miss your dose. Call your doctor or health care professional if you are unable to keep an appointment. What may interact with this medicine? -medicines for cancer chemotherapy -medicines that suppress your immune function -medicines that treat or prevent blood clots like warfarin, enoxaparin, and dalteparin -steroid medicines like prednisone or cortisone This list may not describe all possible interactions. Give your health care provider a list of all the medicines, herbs, non-prescription drugs, or dietary supplements you use. Also tell them if you smoke, drink alcohol, or use illegal drugs. Some items may interact with your medicine. What should I watch for while using this medicine? Mild fever and pain should go away in 3 days or less. Report any unusual symptoms to your doctor or health care professional. What side effects may I notice from receiving this medicine? Side effects that you should report to your doctor or health care professional as soon as possible: -allergic reactions like skin rash, itching or hives, swelling of the face, lips, or tongue -breathing problems -confused -fever over 102 degrees F -pain, tingling, numbness in the hands or feet -seizures -unusual bleeding or bruising -unusual muscle weakness Side effects that usually do not require medical attention (report to your doctor or health care professional if they continue or are bothersome): -aches and pains -diarrhea -fever of 102 degrees F or less -headache -irritable -loss of appetite -pain, tender at site where injected -trouble sleeping This list may not describe all possible side effects. Call  your doctor for medical advice about side effects. You may report side effects to FDA at  1-800-FDA-1088. Where should I keep my medicine? This does not apply. This vaccine is given in a clinic, pharmacy, doctor's office, or other health care setting and will not be stored at home. NOTE: This sheet is a summary. It may not cover all possible information. If you have questions about this medicine, talk to your doctor, pharmacist, or health care provider.  2015, Elsevier/Gold Standard. (2008-10-29 10:17:22)   Health Maintenance Adopting a healthy lifestyle and getting preventive care can go a long way to promote health and wellness. Talk with your health care provider about what schedule of regular examinations is right for you. This is a good chance for you to check in with your provider about disease prevention and staying healthy. In between checkups, there are plenty of things you can do on your own. Experts have done a lot of research about which lifestyle changes and preventive measures are most likely to keep you healthy. Ask your health care provider for more information. WEIGHT AND DIET  Eat a healthy diet  Be sure to include plenty of vegetables, fruits, low-fat dairy products, and lean protein.  Do not eat a lot of foods high in solid fats, added sugars, or salt.  Get regular exercise. This is one of the most important things you can do for your health.  Most adults should exercise for at least 150 minutes each week. The exercise should increase your heart rate and make you sweat (moderate-intensity exercise).  Most adults should also do strengthening exercises at least twice a week. This is in addition to the moderate-intensity exercise.  Maintain a healthy weight  Body mass index (BMI) is a measurement that can be used to identify possible weight problems. It estimates body fat based on height and weight. Your health care provider can help determine your BMI and help you achieve or maintain a healthy weight.  For females 79 years of age and older:   A BMI below  18.5 is considered underweight.  A BMI of 18.5 to 24.9 is normal.  A BMI of 25 to 29.9 is considered overweight.  A BMI of 30 and above is considered obese.  Watch levels of cholesterol and blood lipids  You should start having your blood tested for lipids and cholesterol at 76 years of age, then have this test every 5 years.  You may need to have your cholesterol levels checked more often if:  Your lipid or cholesterol levels are high.  You are older than 76 years of age.  You are at high risk for heart disease.  CANCER SCREENING   Lung Cancer  Lung cancer screening is recommended for adults 9-73 years old who are at high risk for lung cancer because of a history of smoking.  A yearly low-dose CT scan of the lungs is recommended for people who:  Currently smoke.  Have quit within the past 15 years.  Have at least a 30-pack-year history of smoking. A pack year is smoking an average of one pack of cigarettes a day for 1 year.  Yearly screening should continue until it has been 15 years since you quit.  Yearly screening should stop if you develop a health problem that would prevent you from having lung cancer treatment.  Breast Cancer  Practice breast self-awareness. This means understanding how your breasts normally appear and feel.  It also means doing regular breast self-exams. Let your  health care provider know about any changes, no matter how small.  If you are in your 20s or 30s, you should have a clinical breast exam (CBE) by a health care provider every 1-3 years as part of a regular health exam.  If you are 61 or older, have a CBE every year. Also consider having a breast X-ray (mammogram) every year.  If you have a family history of breast cancer, talk to your health care provider about genetic screening.  If you are at high risk for breast cancer, talk to your health care provider about having an MRI and a mammogram every year.  Breast cancer gene (BRCA)  assessment is recommended for women who have family members with BRCA-related cancers. BRCA-related cancers include:  Breast.  Ovarian.  Tubal.  Peritoneal cancers.  Results of the assessment will determine the need for genetic counseling and BRCA1 and BRCA2 testing. Cervical Cancer Routine pelvic examinations to screen for cervical cancer are no longer recommended for nonpregnant women who are considered low risk for cancer of the pelvic organs (ovaries, uterus, and vagina) and who do not have symptoms. A pelvic examination may be necessary if you have symptoms including those associated with pelvic infections. Ask your health care provider if a screening pelvic exam is right for you.   The Pap test is the screening test for cervical cancer for women who are considered at risk.  If you had a hysterectomy for a problem that was not cancer or a condition that could lead to cancer, then you no longer need Pap tests.  If you are older than 65 years, and you have had normal Pap tests for the past 10 years, you no longer need to have Pap tests.  If you have had past treatment for cervical cancer or a condition that could lead to cancer, you need Pap tests and screening for cancer for at least 20 years after your treatment.  If you no longer get a Pap test, assess your risk factors if they change (such as having a new sexual partner). This can affect whether you should start being screened again.  Some women have medical problems that increase their chance of getting cervical cancer. If this is the case for you, your health care provider may recommend more frequent screening and Pap tests.  The human papillomavirus (HPV) test is another test that may be used for cervical cancer screening. The HPV test looks for the virus that can cause cell changes in the cervix. The cells collected during the Pap test can be tested for HPV.  The HPV test can be used to screen women 61 years of age and older.  Getting tested for HPV can extend the interval between normal Pap tests from three to five years.  An HPV test also should be used to screen women of any age who have unclear Pap test results.  After 76 years of age, women should have HPV testing as often as Pap tests.  Colorectal Cancer  This type of cancer can be detected and often prevented.  Routine colorectal cancer screening usually begins at 76 years of age and continues through 76 years of age.  Your health care provider may recommend screening at an earlier age if you have risk factors for colon cancer.  Your health care provider may also recommend using home test kits to check for hidden blood in the stool.  A small camera at the end of a tube can be used to examine  your colon directly (sigmoidoscopy or colonoscopy). This is done to check for the earliest forms of colorectal cancer.  Routine screening usually begins at age 33.  Direct examination of the colon should be repeated every 5-10 years through 76 years of age. However, you may need to be screened more often if early forms of precancerous polyps or small growths are found. Skin Cancer  Check your skin from head to toe regularly.  Tell your health care provider about any new moles or changes in moles, especially if there is a change in a mole's shape or color.  Also tell your health care provider if you have a mole that is larger than the size of a pencil eraser.  Always use sunscreen. Apply sunscreen liberally and repeatedly throughout the day.  Protect yourself by wearing long sleeves, pants, a wide-brimmed hat, and sunglasses whenever you are outside. HEART DISEASE, DIABETES, AND HIGH BLOOD PRESSURE   Have your blood pressure checked at least every 1-2 years. High blood pressure causes heart disease and increases the risk of stroke.  If you are between 11 years and 60 years old, ask your health care provider if you should take aspirin to prevent  strokes.  Have regular diabetes screenings. This involves taking a blood sample to check your fasting blood sugar level.  If you are at a normal weight and have a low risk for diabetes, have this test once every three years after 76 years of age.  If you are overweight and have a high risk for diabetes, consider being tested at a younger age or more often. PREVENTING INFECTION  Hepatitis B  If you have a higher risk for hepatitis B, you should be screened for this virus. You are considered at high risk for hepatitis B if:  You were born in a country where hepatitis B is common. Ask your health care provider which countries are considered high risk.  Your parents were born in a high-risk country, and you have not been immunized against hepatitis B (hepatitis B vaccine).  You have HIV or AIDS.  You use needles to inject street drugs.  You live with someone who has hepatitis B.  You have had sex with someone who has hepatitis B.  You get hemodialysis treatment.  You take certain medicines for conditions, including cancer, organ transplantation, and autoimmune conditions. Hepatitis C  Blood testing is recommended for:  Everyone born from [birth year] through 1967.  Anyone with known risk factors for hepatitis C. Sexually transmitted infections (STIs)  You should be screened for sexually transmitted infections (STIs) including gonorrhea and chlamydia if:  You are sexually active and are younger than 76 years of age.  You are older than 76 years of age and your health care provider tells you that you are at risk for this type of infection.  Your sexual activity has changed since you were last screened and you are at an increased risk for chlamydia or gonorrhea. Ask your health care provider if you are at risk.  If you do not have HIV, but are at risk, it may be recommended that you take a prescription medicine daily to prevent HIV infection. This is called pre-exposure prophylaxis  (PrEP). You are considered at risk if:  You are sexually active and do not regularly use condoms or know the HIV status of your partner(s).  You take drugs by injection.  You are sexually active with a partner who has HIV. Talk with your health care provider about whether  you are at high risk of being infected with HIV. If you choose to begin PrEP, you should first be tested for HIV. You should then be tested every 3 months for as long as you are taking PrEP.  PREGNANCY   If you are premenopausal and you may become pregnant, ask your health care provider about preconception counseling.  If you may become pregnant, take 400 to 800 micrograms (mcg) of folic acid every day.  If you want to prevent pregnancy, talk to your health care provider about birth control (contraception). OSTEOPOROSIS AND MENOPAUSE   Osteoporosis is a disease in which the bones lose minerals and strength with aging. This can result in serious bone fractures. Your risk for osteoporosis can be identified using a bone density scan.  If you are 54 years of age or older, or if you are at risk for osteoporosis and fractures, ask your health care provider if you should be screened.  Ask your health care provider whether you should take a calcium or vitamin D supplement to lower your risk for osteoporosis.  Menopause may have certain physical symptoms and risks.  Hormone replacement therapy may reduce some of these symptoms and risks. Talk to your health care provider about whether hormone replacement therapy is right for you.  HOME CARE INSTRUCTIONS   Schedule regular health, dental, and eye exams.  Stay current with your immunizations.   Do not use any tobacco products including cigarettes, chewing tobacco, or electronic cigarettes.  If you are pregnant, do not drink alcohol.  If you are breastfeeding, limit how much and how often you drink alcohol.  Limit alcohol intake to no more than 1 drink per day for  nonpregnant women. One drink equals 12 ounces of beer, 5 ounces of wine, or 1 ounces of hard liquor.  Do not use street drugs.  Do not share needles.  Ask your health care provider for help if you need support or information about quitting drugs.  Tell your health care provider if you often feel depressed.  Tell your health care provider if you have ever been abused or do not feel safe at home. Document Released: 03/01/2011 Document Revised: 12/31/2013 Document Reviewed: 07/18/2013 [facility] Patient Information 2015 [place], [place]. This information is not intended to replace advice given to you by your health care provider. Make sure you discuss any questions you have with your health care provider.

## 2015-03-18 ENCOUNTER — Other Ambulatory Visit: Payer: Self-pay

## 2015-03-18 MED ORDER — METFORMIN HCL 500 MG PO TABS: 500.0000 mg | ORAL_TABLET | Freq: Every day | ORAL | Status: DC

## 2015-04-10 ENCOUNTER — Telehealth: Payer: Self-pay | Admitting: Family Medicine

## 2015-04-10 NOTE — Telephone Encounter (Signed)
Faxed note to [facility]

## 2015-05-06 ENCOUNTER — Other Ambulatory Visit: Payer: Self-pay | Admitting: *Deleted

## 2015-05-06 MED ORDER — FUROSEMIDE 40 MG PO TABS: ORAL_TABLET | ORAL | Status: DC

## 2015-05-10 ENCOUNTER — Other Ambulatory Visit: Payer: Self-pay | Admitting: Family Medicine

## 2015-06-09 ENCOUNTER — Encounter: Payer: Self-pay | Admitting: Family Medicine

## 2015-06-09 ENCOUNTER — Ambulatory Visit (INDEPENDENT_AMBULATORY_CARE_PROVIDER_SITE_OTHER): Payer: Medicare Other | Admitting: Family Medicine

## 2015-06-09 VITALS — BP 154/74 | HR 63 | Temp 96.9°F | Ht 65.0 in | Wt 179.2 lb

## 2015-06-09 DIAGNOSIS — E785 Hyperlipidemia, unspecified: Secondary | ICD-10-CM | POA: Diagnosis not present

## 2015-06-09 DIAGNOSIS — E118 Type 2 diabetes mellitus with unspecified complications: Secondary | ICD-10-CM | POA: Diagnosis not present

## 2015-06-09 DIAGNOSIS — Z794 Long term (current) use of insulin: Secondary | ICD-10-CM | POA: Diagnosis not present

## 2015-06-09 DIAGNOSIS — I509 Heart failure, unspecified: Secondary | ICD-10-CM | POA: Diagnosis not present

## 2015-06-09 DIAGNOSIS — Z23 Encounter for immunization: Secondary | ICD-10-CM

## 2015-06-09 DIAGNOSIS — J9620 Acute and chronic respiratory failure, unspecified whether with hypoxia or hypercapnia: Secondary | ICD-10-CM | POA: Diagnosis not present

## 2015-06-09 LAB — POCT GLYCOSYLATED HEMOGLOBIN (HGB A1C): Hemoglobin A1C: 5.9

## 2015-06-09 MED ORDER — FLUTICASONE FUROATE-VILANTEROL 100-25 MCG/INH IN AEPB: 1.0000 | INHALATION_SPRAY | RESPIRATORY_TRACT | Status: DC

## 2015-06-09 NOTE — Progress Notes (Signed)
   Subjective:    Patient ID: Sonya Oliver, female    DOB: [birth year], 74 y.o.   MRN: [id number]  HPI 74 year old female who is here to follow-up COPD, heart failure, and type 2 diabetes. Generally she's been doing well. She has some shortness of breath and uses albuterol inhaler. She tells me she has never been on long-acting bronchodilation or or steroid-induced inhaler. She does not check sugars but A1c at last visit was 6.1. We will repeat that today. She denies any dependent edema or chest pain. She denies orthopnea.  Patient Active Problem List   Diagnosis Date Noted  . Hypokalemia 08/22/2013  . Acute on chronic diastolic heart failure ([place]) 06/20/2013  . Acute-on-chronic respiratory failure ([place]) 06/20/2013  . CHF (congestive heart failure) ([place]) 12/19/2012  . COPD (chronic obstructive pulmonary disease) ([place]) 12/19/2012  . Unspecified vitamin D deficiency 12/19/2012  . HTN (hypertension) 12/19/2012  . SOB (shortness of breath) 09/12/2012  . Edema leg 09/12/2012  . Diabetes ([place]) 09/12/2012  . Anemia 03/21/2012  . Syncope 03/21/2012  . Hematuria, gross 02/23/2012  . Anemia associated with acute blood loss 02/23/2012  . Orthostatic hypotension 02/23/2012  . Bladder mass 02/23/2012  . Hyperthyroidism 02/23/2012   Outpatient Encounter Prescriptions as of 06/09/2015  Medication Sig  . albuterol (PROVENTIL HFA;VENTOLIN HFA) 108 (90 BASE) MCG/ACT inhaler Inhale 1-2 puffs into the lungs every 6 (six) hours as needed for wheezing or shortness of breath.  . carvedilol (COREG) 3.125 MG tablet TAKE 1 TABLET BY MOUTH TWICE DAILY  . Cholecalciferol (VITAMIN D3) 2000 UNITS TABS Take 1 tablet by mouth daily.  . furosemide (LASIX) 40 MG tablet TAKE 1 TABLET BY MOUTH ONCE DAILY  . metFORMIN (GLUCOPHAGE) 500 MG tablet Take 1 tablet (500 mg total) by mouth daily.  . potassium chloride SA (KLOR-CON M20) 20 MEQ tablet Take 1 tablet (20 mEq total) by mouth daily.   No facility-administered  encounter medications on file as of 06/09/2015.      Review of Systems  Constitutional: Negative.   Respiratory: Negative.   Cardiovascular: Negative.   Neurological: Negative.   Psychiatric/Behavioral: Negative.        Objective:   Physical Exam  Constitutional: She is oriented to person, place, and time. She appears well-developed and well-nourished.  Cardiovascular: Normal rate and regular rhythm.   Pulmonary/Chest: Effort normal and breath sounds normal.  Neurological: She is alert and oriented to person, place, and time.  Psychiatric: She has a normal mood and affect. Thought content normal.          Assessment & Plan:  1. Type 2 diabetes mellitus with complication, with long-term current use of insulin (HCC) A1c today is 5.9. Her diabetic control is excellent - POCT glycosylated hemoglobin (Hb A1C)  2. Acute on chronic respiratory failure, unspecified whether with hypoxia or hypercapnia (HCC) Will add long-acting bronchodilation or steroid-induced, Breo  3. Congestive heart failure, unspecified congestive heart failure chronicity, unspecified congestive heart failure type ([place]) Continue with her vital all. CHF seems well compensated at this time  4. Hyperlipidemia A1c is at goal 95 when last checked. We will repeat that today - Lipid panel  5. Encounter for immunization Flu shot given  Wardell Honour MD

## 2015-06-10 LAB — LIPID PANEL
Chol/HDL Ratio: 2.2 ratio units (ref 0.0–4.4)
Cholesterol, Total: 220 mg/dL — ABNORMAL HIGH (ref 100–199)
HDL: 102 mg/dL (ref >39)
LDL Calculated: 109 mg/dL — ABNORMAL HIGH (ref 0–99)
Triglycerides: 44 mg/dL (ref 0–149)
VLDL Cholesterol Cal: 9 mg/dL (ref 5–40)

## 2015-06-11 ENCOUNTER — Other Ambulatory Visit: Payer: Self-pay | Admitting: Family Medicine

## 2015-08-04 ENCOUNTER — Other Ambulatory Visit: Payer: Self-pay

## 2015-08-04 MED ORDER — FUROSEMIDE 40 MG PO TABS: ORAL_TABLET | ORAL | Status: DC

## 2015-08-04 MED ORDER — METFORMIN HCL 500 MG PO TABS: 500.0000 mg | ORAL_TABLET | Freq: Every day | ORAL | Status: DC

## 2015-08-07 ENCOUNTER — Ambulatory Visit (INDEPENDENT_AMBULATORY_CARE_PROVIDER_SITE_OTHER): Payer: Medicare Other | Admitting: Family Medicine

## 2015-08-07 ENCOUNTER — Encounter: Payer: Self-pay | Admitting: Family Medicine

## 2015-08-07 VITALS — BP 141/65 | HR 67 | Temp 96.5°F | Ht 65.0 in | Wt 179.6 lb

## 2015-08-07 DIAGNOSIS — N3289 Other specified disorders of bladder: Secondary | ICD-10-CM | POA: Diagnosis not present

## 2015-08-07 DIAGNOSIS — Z794 Long term (current) use of insulin: Secondary | ICD-10-CM | POA: Diagnosis not present

## 2015-08-07 DIAGNOSIS — E118 Type 2 diabetes mellitus with unspecified complications: Secondary | ICD-10-CM

## 2015-08-07 DIAGNOSIS — R600 Localized edema: Secondary | ICD-10-CM

## 2015-08-07 DIAGNOSIS — I509 Heart failure, unspecified: Secondary | ICD-10-CM

## 2015-08-07 MED ORDER — FLUTICASONE FUROATE-VILANTEROL 100-25 MCG/INH IN AEPB: 1.0000 | INHALATION_SPRAY | RESPIRATORY_TRACT | Status: DC

## 2015-08-07 NOTE — Progress Notes (Signed)
   Subjective:    Patient ID: Sonya Oliver, female    DOB: 04/04/1941, 74 y.o.   MRN: [id number]  HPI  74 year old female with diabetes control with metformin 500 mg once a day. She also has a history of congestive heart failure for which she takes carvedilol -furosemide. She denies any recent symptoms of chest pain shortness of breath. She does have some dependent edema.  There is a history of COPD denies any shortness of breath cough or wheeze but does request a refill of a  Breoinhaler if available  Patient Active Problem List   Diagnosis Date Noted  . Hypokalemia 08/22/2013  . Acute on chronic diastolic heart failure ([place]) 06/20/2013  . Acute-on-chronic respiratory failure ([place]) 06/20/2013  . CHF (congestive heart failure) ([place]) 12/19/2012  . COPD (chronic obstructive pulmonary disease) ([place]) 12/19/2012  . Unspecified vitamin D deficiency 12/19/2012  . HTN (hypertension) 12/19/2012  . SOB (shortness of breath) 09/12/2012  . Edema leg 09/12/2012  . Diabetes ([place]) 09/12/2012  . Anemia 03/21/2012  . Syncope 03/21/2012  . Hematuria, gross 02/23/2012  . Anemia associated with acute blood loss 02/23/2012  . Orthostatic hypotension 02/23/2012  . Bladder mass 02/23/2012  . Hyperthyroidism 02/23/2012   Outpatient Encounter Prescriptions as of 08/07/2015  Medication Sig  . albuterol (PROVENTIL HFA;VENTOLIN HFA) 108 (90 BASE) MCG/ACT inhaler Inhale 1-2 puffs into the lungs every 6 (six) hours as needed for wheezing or shortness of breath.  . carvedilol (COREG) 3.125 MG tablet TAKE 1 TABLET BY MOUTH TWICE DAILY  . Cholecalciferol (VITAMIN D3) 2000 UNITS TABS Take 1 tablet by mouth daily.  . Fluticasone Furoate-Vilanterol 100-25 MCG/INH AEPB Inhale 1 Inhaler into the lungs 1 day or 1 dose.  . furosemide (LASIX) 40 MG tablet TAKE 1 TABLET BY MOUTH ONCE DAILY  . KLOR-CON M20 20 MEQ tablet TAKE 1 TABLET (20 MEQ TOTAL) BY MOUTH DAILY.  . metFORMIN (GLUCOPHAGE) 500 MG tablet Take 1 tablet  (500 mg total) by mouth daily.   No facility-administered encounter medications on file as of 08/07/2015.      Review of Systems  HENT: Negative.   Respiratory: Positive for cough.   Cardiovascular: Negative.   Gastrointestinal: Negative.   Genitourinary: Positive for urgency.  Neurological: Negative.        Objective:   Physical Exam  Constitutional: She is oriented to person, place, and time. She appears well-developed and well-nourished.  Cardiovascular: Normal rate and regular rhythm.   Pulmonary/Chest: Effort normal and breath sounds normal.  Abdominal: Soft. Bowel sounds are normal.  Neurological: She is alert and oriented to person, place, and time.  Psychiatric: She has a normal mood and affect.          Assessment & Plan:   1. Congestive heart failure, unspecified congestive heart failure chronicity, unspecified congestive heart failure type ([place]) Stable. Current treatment includes by law and furosemide. This was not followed by cardiology  2. Type 2 diabetes mellitus with complication, with long-term current use of insulin (HCC) Diabetes seems to be a mild problem with this person. A1c was 5.9 when last checked 2 months ago  3. Bladder mass All of by urology. As of her last visit did not need to see her again for 6 months  4. Bilateral edema of lower extremity Symptoms Are controlled with furosemide  Wardell Honour MD

## 2015-08-07 NOTE — Addendum Note (Signed)
Addended by: Jamelle Haring on: 08/07/2015 10:21 AM   Modules accepted: Orders

## 2015-08-07 NOTE — Addendum Note (Signed)
Addended by: Earlene Plater on: 08/07/2015 12:06 PM   Modules accepted: Miquel Dunn

## 2015-08-07 NOTE — Addendum Note (Signed)
Addended by: Jamelle Haring on: 08/07/2015 10:22 AM   Modules accepted: Miquel Dunn

## 2015-08-10 ENCOUNTER — Other Ambulatory Visit: Payer: Self-pay | Admitting: Family Medicine

## 2015-09-10 ENCOUNTER — Ambulatory Visit: Payer: Medicare Other | Admitting: Family Medicine

## 2015-09-30 LAB — HM MAMMOGRAPHY

## 2015-10-03 ENCOUNTER — Encounter: Payer: Self-pay | Admitting: *Deleted

## 2015-11-04 ENCOUNTER — Other Ambulatory Visit: Payer: Self-pay | Admitting: *Deleted

## 2015-11-04 MED ORDER — FUROSEMIDE 40 MG PO TABS: ORAL_TABLET | ORAL | Status: DC

## 2015-11-04 MED ORDER — METFORMIN HCL 500 MG PO TABS: 500.0000 mg | ORAL_TABLET | Freq: Every day | ORAL | Status: DC

## 2015-11-05 ENCOUNTER — Other Ambulatory Visit: Payer: Self-pay | Admitting: Family Medicine

## 2015-11-13 ENCOUNTER — Ambulatory Visit (INDEPENDENT_AMBULATORY_CARE_PROVIDER_SITE_OTHER): Payer: Medicare Other | Admitting: Family Medicine

## 2015-11-13 ENCOUNTER — Encounter: Payer: Self-pay | Admitting: Family Medicine

## 2015-11-13 VITALS — BP 125/68 | HR 63 | Temp 97.3°F | Ht 65.0 in | Wt 178.8 lb

## 2015-11-13 DIAGNOSIS — I10 Essential (primary) hypertension: Secondary | ICD-10-CM

## 2015-11-13 DIAGNOSIS — E785 Hyperlipidemia, unspecified: Secondary | ICD-10-CM

## 2015-11-13 DIAGNOSIS — E118 Type 2 diabetes mellitus with unspecified complications: Secondary | ICD-10-CM

## 2015-11-13 DIAGNOSIS — I509 Heart failure, unspecified: Secondary | ICD-10-CM

## 2015-11-13 DIAGNOSIS — Z794 Long term (current) use of insulin: Secondary | ICD-10-CM

## 2015-11-13 LAB — BAYER DCA HB A1C WAIVED
HB A1C (BAYER DCA - WAIVED): 6.1 % (ref <7.0)
HB A1C (BAYER DCA - WAIVED): 5.9 % (ref <7.0)

## 2015-11-13 NOTE — Progress Notes (Signed)
   Subjective:    Patient ID: Sonya Oliver, female    DOB: 1940-09-29, 75 y.o.   MRN: [id number]  HPI 75 year old female who is here to follow-up her diabetes, hypertension, and congestive heart failure. Her family watches her diet and watches her going up and down steps. She does not attempt to go up stairs in her house but has 1 or 2 steps going in and out of the house. Uses her walker most of the time. She denies any edema shortness of breath.    Review of Systems  Constitutional: Negative.   HENT: Negative.   Respiratory: Negative.   Cardiovascular: Negative.   Neurological: Negative.   Psychiatric/Behavioral: Negative.        Patient Active Problem List   Diagnosis Date Noted  . Hypokalemia 08/22/2013  . Acute on chronic diastolic heart failure ([place]) 06/20/2013  . Acute-on-chronic respiratory failure ([place]) 06/20/2013  . CHF (congestive heart failure) ([place]) 12/19/2012  . COPD (chronic obstructive pulmonary disease) ([place]) 12/19/2012  . Unspecified vitamin D deficiency 12/19/2012  . HTN (hypertension) 12/19/2012  . SOB (shortness of breath) 09/12/2012  . Edema leg 09/12/2012  . Diabetes ([place]) 09/12/2012  . Anemia 03/21/2012  . Syncope 03/21/2012  . Hematuria, gross 02/23/2012  . Anemia associated with acute blood loss 02/23/2012  . Orthostatic hypotension 02/23/2012  . Bladder mass 02/23/2012  . Hyperthyroidism 02/23/2012   Outpatient Encounter Prescriptions as of 11/13/2015  Medication Sig  . albuterol (PROVENTIL HFA;VENTOLIN HFA) 108 (90 BASE) MCG/ACT inhaler Inhale 1-2 puffs into the lungs every 6 (six) hours as needed for wheezing or shortness of breath.  . carvedilol (COREG) 3.125 MG tablet TAKE 1 TABLET BY MOUTH TWICE DAILY  . Cholecalciferol (VITAMIN D3) 2000 UNITS TABS Take 1 tablet by mouth daily.  . Fluticasone Furoate-Vilanterol 100-25 MCG/INH AEPB Inhale 1 Inhaler into the lungs 1 day or 1 dose.  . furosemide (LASIX) 40 MG tablet TAKE 1 TABLET BY MOUTH  ONCE DAILY  . KLOR-CON M20 20 MEQ tablet TAKE 1 TABLET (20 MEQ TOTAL) BY MOUTH DAILY.  . metFORMIN (GLUCOPHAGE) 500 MG tablet Take 1 tablet (500 mg total) by mouth daily.   No facility-administered encounter medications on file as of 11/13/2015.    Objective:   Physical Exam  Constitutional: She is oriented to person, place, and time. She appears well-developed and well-nourished.  Cardiovascular: Normal rate, regular rhythm, normal heart sounds and intact distal pulses.   Pulmonary/Chest: Effort normal and breath sounds normal.  Neurological: She is alert and oriented to person, place, and time.  Psychiatric: She has a normal mood and affect. Her behavior is normal.          Assessment & Plan:  1. Type 2 diabetes mellitus with complication, with long-term current use of insulin (HCC) Metformin once a day. That with stricter adherence to low carb diet seems to be managing her diabetes well - Bayer DCA Hb A1c Waived  2. Essential hypertension Blood pressures are well controlled today is 1.5 or 68 - CMP14+EGFR  3. Congestive heart failure, unspecified congestive heart failure chronicity, unspecified congestive heart failure type ([place]) Well compensated. Followed by cardiology as well  4. Hyperlipidemia Last A1c was almost at goal at 109. She is on no medications - Lipid panel Wardell Honour MD

## 2015-11-13 NOTE — Patient Instructions (Signed)
Thank you for allowing us to care for you today. We strive to provide exceptional quality and compassionate care. Please let us know how we are doing and how we can help serve you better by filling out the survey that you receive from [employer].     

## 2015-11-14 LAB — CMP14+EGFR
ALT: 7 IU/L (ref 0–32)
AST: 14 IU/L (ref 0–40)
Albumin/Globulin Ratio: 1 — ABNORMAL LOW (ref 1.2–2.2)
Albumin: 3.9 g/dL (ref 3.5–4.8)
Alkaline Phosphatase: 63 IU/L (ref 39–117)
BUN/Creatinine Ratio: 20 (ref 11–26)
BUN: 16 mg/dL (ref 8–27)
Bilirubin Total: 0.5 mg/dL (ref 0.0–1.2)
CALCIUM: 9.6 mg/dL (ref 8.7–10.3)
CO2: 29 mmol/L (ref 18–29)
CREATININE: 0.81 mg/dL (ref 0.57–1.00)
Chloride: 98 mmol/L (ref 96–106)
GFR calc Af Amer: 82 mL/min/{1.73_m2} (ref >59)
GFR calc non Af Amer: 71 mL/min/{1.73_m2} (ref >59)
Globulin, Total: 3.8 g/dL (ref 1.5–4.5)
Glucose: 95 mg/dL (ref 65–99)
POTASSIUM: 4.7 mmol/L (ref 3.5–5.2)
Sodium: 140 mmol/L (ref 134–144)
Total Protein: 7.7 g/dL (ref 6.0–8.5)

## 2015-11-14 LAB — LIPID PANEL
CHOL/HDL RATIO: 2.6 ratio (ref 0.0–4.4)
Cholesterol, Total: 217 mg/dL — ABNORMAL HIGH (ref 100–199)
HDL: 83 mg/dL (ref >39)
LDL CALC: 123 mg/dL — AB (ref 0–99)
Triglycerides: 56 mg/dL (ref 0–149)
VLDL Cholesterol Cal: 11 mg/dL (ref 5–40)

## 2015-12-05 ENCOUNTER — Other Ambulatory Visit: Payer: Self-pay | Admitting: Family Medicine

## 2015-12-14 ENCOUNTER — Emergency Department (HOSPITAL_COMMUNITY): Payer: Medicare Other

## 2015-12-14 ENCOUNTER — Inpatient Hospital Stay (HOSPITAL_COMMUNITY): Payer: Medicare Other

## 2015-12-14 ENCOUNTER — Inpatient Hospital Stay (HOSPITAL_COMMUNITY)
Admission: EM | Admit: 2015-12-14 | Discharge: 2015-12-18 | DRG: 190 | Disposition: A | Discharge status: Home / Self Care | Payer: Medicare Other | Attending: Internal Medicine | Admitting: Internal Medicine

## 2015-12-14 ENCOUNTER — Encounter (HOSPITAL_COMMUNITY): Payer: Self-pay | Admitting: Emergency Medicine

## 2015-12-14 DIAGNOSIS — J441 Chronic obstructive pulmonary disease with (acute) exacerbation: Secondary | ICD-10-CM | POA: Diagnosis present

## 2015-12-14 DIAGNOSIS — N3289 Other specified disorders of bladder: Secondary | ICD-10-CM | POA: Diagnosis present

## 2015-12-14 DIAGNOSIS — J449 Chronic obstructive pulmonary disease, unspecified: Secondary | ICD-10-CM | POA: Diagnosis present

## 2015-12-14 DIAGNOSIS — Z833 Family history of diabetes mellitus: Secondary | ICD-10-CM | POA: Diagnosis not present

## 2015-12-14 DIAGNOSIS — I5033 Acute on chronic diastolic (congestive) heart failure: Secondary | ICD-10-CM | POA: Diagnosis present

## 2015-12-14 DIAGNOSIS — Z87891 Personal history of nicotine dependence: Secondary | ICD-10-CM | POA: Diagnosis not present

## 2015-12-14 DIAGNOSIS — J9621 Acute and chronic respiratory failure with hypoxia: Secondary | ICD-10-CM | POA: Diagnosis present

## 2015-12-14 DIAGNOSIS — Z7984 Long term (current) use of oral hypoglycemic drugs: Secondary | ICD-10-CM | POA: Diagnosis not present

## 2015-12-14 DIAGNOSIS — J9601 Acute respiratory failure with hypoxia: Secondary | ICD-10-CM | POA: Diagnosis present

## 2015-12-14 DIAGNOSIS — E0520 Thyrotoxicosis with toxic multinodular goiter without thyrotoxic crisis or storm: Secondary | ICD-10-CM | POA: Diagnosis present

## 2015-12-14 DIAGNOSIS — E119 Type 2 diabetes mellitus without complications: Secondary | ICD-10-CM | POA: Diagnosis present

## 2015-12-14 DIAGNOSIS — R0602 Shortness of breath: Secondary | ICD-10-CM

## 2015-12-14 DIAGNOSIS — J9620 Acute and chronic respiratory failure, unspecified whether with hypoxia or hypercapnia: Secondary | ICD-10-CM | POA: Diagnosis present

## 2015-12-14 DIAGNOSIS — J189 Pneumonia, unspecified organism: Secondary | ICD-10-CM

## 2015-12-14 DIAGNOSIS — Z9981 Dependence on supplemental oxygen: Secondary | ICD-10-CM | POA: Diagnosis not present

## 2015-12-14 DIAGNOSIS — I509 Heart failure, unspecified: Secondary | ICD-10-CM

## 2015-12-14 DIAGNOSIS — J440 Chronic obstructive pulmonary disease with acute lower respiratory infection: Secondary | ICD-10-CM | POA: Diagnosis present

## 2015-12-14 DIAGNOSIS — J181 Lobar pneumonia, unspecified organism: Secondary | ICD-10-CM | POA: Diagnosis present

## 2015-12-14 DIAGNOSIS — R0902 Hypoxemia: Secondary | ICD-10-CM | POA: Diagnosis present

## 2015-12-14 DIAGNOSIS — J9602 Acute respiratory failure with hypercapnia: Secondary | ICD-10-CM | POA: Diagnosis present

## 2015-12-14 DIAGNOSIS — I10 Essential (primary) hypertension: Secondary | ICD-10-CM | POA: Diagnosis present

## 2015-12-14 DIAGNOSIS — E118 Type 2 diabetes mellitus with unspecified complications: Secondary | ICD-10-CM | POA: Diagnosis not present

## 2015-12-14 DIAGNOSIS — I110 Hypertensive heart disease with heart failure: Secondary | ICD-10-CM | POA: Diagnosis present

## 2015-12-14 LAB — COMPREHENSIVE METABOLIC PANEL
ALBUMIN: 3.9 g/dL (ref 3.5–5.0)
ALT: 12 U/L — ABNORMAL LOW (ref 14–54)
ANION GAP: 7 (ref 5–15)
AST: 21 U/L (ref 15–41)
Alkaline Phosphatase: 61 U/L (ref 38–126)
BUN: 20 mg/dL (ref 6–20)
CHLORIDE: 104 mmol/L (ref 101–111)
CO2: 27 mmol/L (ref 22–32)
Calcium: 8.8 mg/dL — ABNORMAL LOW (ref 8.9–10.3)
Creatinine, Ser: 0.66 mg/dL (ref 0.44–1.00)
GFR calc Af Amer: >60 mL/min (ref >60)
GFR calc non Af Amer: >60 mL/min (ref >60)
GLUCOSE: 88 mg/dL (ref 65–99)
POTASSIUM: 4.2 mmol/L (ref 3.5–5.1)
SODIUM: 138 mmol/L (ref 135–145)
TOTAL PROTEIN: 8.7 g/dL — AB (ref 6.5–8.1)
Total Bilirubin: 0.8 mg/dL (ref 0.3–1.2)

## 2015-12-14 LAB — BLOOD GAS, ARTERIAL
ACID-BASE EXCESS: 0.8 mmol/L (ref 0.0–2.0)
Bicarbonate: 24.9 mEq/L — ABNORMAL HIGH (ref 20.0–24.0)
DRAWN BY: 22179
Delivery systems: POSITIVE
Expiratory PAP: 8
FIO2: 35
Inspiratory PAP: 16
LHR: 8 {breaths}/min
O2 SAT: 98.5 %
PCO2 ART: 43.6 mmHg (ref 35.0–45.0)
PH ART: 7.382 (ref 7.350–7.450)
Patient temperature: 37
TCO2: 15.8 mmol/L (ref 0–100)
pO2, Arterial: 115 mmHg — ABNORMAL HIGH (ref 80.0–100.0)

## 2015-12-14 LAB — CBC
HEMATOCRIT: 39.9 % (ref 36.0–46.0)
HEMOGLOBIN: 12.4 g/dL (ref 12.0–15.0)
MCH: 27.3 pg (ref 26.0–34.0)
MCHC: 31.1 g/dL (ref 30.0–36.0)
MCV: 87.9 fL (ref 78.0–100.0)
PLATELETS: 144 10*3/uL — AB (ref 150–400)
RBC: 4.54 MIL/uL (ref 3.87–5.11)
RDW: 14.2 % (ref 11.5–15.5)
WBC: 4.3 10*3/uL (ref 4.0–10.5)

## 2015-12-14 LAB — GLUCOSE, CAPILLARY
GLUCOSE-CAPILLARY: 211 mg/dL — AB (ref 65–99)
GLUCOSE-CAPILLARY: 126 mg/dL — AB (ref 65–99)

## 2015-12-14 LAB — I-STAT TROPONIN, ED: Troponin i, poc: 0 ng/mL (ref 0.00–0.08)

## 2015-12-14 LAB — BRAIN NATRIURETIC PEPTIDE: B NATRIURETIC PEPTIDE 5: 84 pg/mL (ref 0.0–100.0)

## 2015-12-14 LAB — TSH: TSH: 0.585 u[IU]/mL (ref 0.350–4.500)

## 2015-12-14 MED ORDER — CETYLPYRIDINIUM CHLORIDE 0.05 % MT LIQD
7.0000 mL | Freq: Two times a day (BID) | OROMUCOSAL | Status: DC
  Administered 2015-12-14 – 2015-12-18 (×7): 7 mL via OROMUCOSAL

## 2015-12-14 MED ORDER — IPRATROPIUM-ALBUTEROL 0.5-2.5 (3) MG/3ML IN SOLN
3.0000 mL | Freq: Four times a day (QID) | RESPIRATORY_TRACT | Status: DC
  Administered 2015-12-14 – 2015-12-18 (×14): 3 mL via RESPIRATORY_TRACT
  Filled 2015-12-14 (×15): qty 3

## 2015-12-14 MED ORDER — ONDANSETRON HCL 4 MG PO TABS: 4.0000 mg | ORAL_TABLET | Freq: Four times a day (QID) | ORAL | Status: DC | PRN

## 2015-12-14 MED ORDER — DEXTROSE 5 % IV SOLN
1.0000 g | Freq: Once | INTRAVENOUS | Status: AC
  Administered 2015-12-14: 1 g via INTRAVENOUS
  Filled 2015-12-14: qty 10

## 2015-12-14 MED ORDER — ENOXAPARIN SODIUM 40 MG/0.4ML ~~LOC~~ SOLN
40.0000 mg | SUBCUTANEOUS | Status: DC
  Administered 2015-12-14 – 2015-12-17 (×4): 40 mg via SUBCUTANEOUS
  Filled 2015-12-14 (×4): qty 0.4

## 2015-12-14 MED ORDER — ALBUTEROL (5 MG/ML) CONTINUOUS INHALATION SOLN
15.0000 mg/h | INHALATION_SOLUTION | Freq: Once | RESPIRATORY_TRACT | Status: AC
  Administered 2015-12-14: 15 mg/h via RESPIRATORY_TRACT
  Filled 2015-12-14: qty 20

## 2015-12-14 MED ORDER — METHYLPREDNISOLONE SODIUM SUCC 40 MG IJ SOLR
40.0000 mg | Freq: Two times a day (BID) | INTRAMUSCULAR | Status: DC
  Administered 2015-12-14 – 2015-12-16 (×4): 40 mg via INTRAVENOUS
  Filled 2015-12-14 (×4): qty 1

## 2015-12-14 MED ORDER — VITAMIN D 1000 UNITS PO TABS
2000.0000 [IU] | ORAL_TABLET | Freq: Every day | ORAL | Status: DC
  Administered 2015-12-15 – 2015-12-18 (×4): 2000 [IU] via ORAL
  Filled 2015-12-14 (×6): qty 2

## 2015-12-14 MED ORDER — GUAIFENESIN-DM 100-10 MG/5ML PO SYRP: 5.0000 mL | ORAL_SOLUTION | ORAL | Status: DC | PRN

## 2015-12-14 MED ORDER — ACETAMINOPHEN 650 MG RE SUPP: 650.0000 mg | Freq: Four times a day (QID) | RECTAL | Status: DC | PRN

## 2015-12-14 MED ORDER — MAGNESIUM SULFATE 2 GM/50ML IV SOLN
2.0000 g | Freq: Once | INTRAVENOUS | Status: AC
  Administered 2015-12-14: 2 g via INTRAVENOUS
  Filled 2015-12-14: qty 50

## 2015-12-14 MED ORDER — AZITHROMYCIN 500 MG IV SOLR
500.0000 mg | Freq: Once | INTRAVENOUS | Status: AC
  Administered 2015-12-14: 500 mg via INTRAVENOUS
  Filled 2015-12-14: qty 500

## 2015-12-14 MED ORDER — FUROSEMIDE 10 MG/ML IJ SOLN
40.0000 mg | Freq: Every day | INTRAMUSCULAR | Status: DC
  Administered 2015-12-15 – 2015-12-16 (×2): 40 mg via INTRAVENOUS
  Filled 2015-12-14 (×2): qty 4

## 2015-12-14 MED ORDER — POLYETHYLENE GLYCOL 3350 17 G PO PACK: 17.0000 g | PACK | Freq: Every day | ORAL | Status: DC | PRN

## 2015-12-14 MED ORDER — AZITHROMYCIN 250 MG PO TABS
500.0000 mg | ORAL_TABLET | Freq: Every day | ORAL | Status: DC
  Administered 2015-12-15 – 2015-12-18 (×4): 500 mg via ORAL
  Filled 2015-12-14 (×5): qty 2

## 2015-12-14 MED ORDER — INSULIN ASPART 100 UNIT/ML ~~LOC~~ SOLN: 0.0000 [IU] | Freq: Every day | SUBCUTANEOUS | Status: DC

## 2015-12-14 MED ORDER — POTASSIUM CHLORIDE CRYS ER 20 MEQ PO TBCR
20.0000 meq | EXTENDED_RELEASE_TABLET | Freq: Every day | ORAL | Status: DC
  Administered 2015-12-15 – 2015-12-18 (×4): 20 meq via ORAL
  Filled 2015-12-14 (×4): qty 1

## 2015-12-14 MED ORDER — ONDANSETRON HCL 4 MG/2ML IJ SOLN: 4.0000 mg | Freq: Four times a day (QID) | INTRAMUSCULAR | Status: DC | PRN

## 2015-12-14 MED ORDER — DEXTROSE 5 % IV SOLN
1.0000 g | INTRAVENOUS | Status: DC
  Administered 2015-12-15 – 2015-12-17 (×3): 1 g via INTRAVENOUS
  Filled 2015-12-14 (×5): qty 10

## 2015-12-14 MED ORDER — INSULIN ASPART 100 UNIT/ML ~~LOC~~ SOLN
0.0000 [IU] | Freq: Three times a day (TID) | SUBCUTANEOUS | Status: DC
  Administered 2015-12-14: 5 [IU] via SUBCUTANEOUS
  Administered 2015-12-16 (×2): 2 [IU] via SUBCUTANEOUS
  Administered 2015-12-17: 3 [IU] via SUBCUTANEOUS

## 2015-12-14 MED ORDER — CARVEDILOL 3.125 MG PO TABS
3.1250 mg | ORAL_TABLET | Freq: Two times a day (BID) | ORAL | Status: DC
Start: 2015-12-14 — End: 2015-12-18
  Administered 2015-12-14 – 2015-12-18 (×8): 3.125 mg via ORAL
  Filled 2015-12-14 (×8): qty 1

## 2015-12-14 MED ORDER — ACETAMINOPHEN 325 MG PO TABS: 650.0000 mg | ORAL_TABLET | Freq: Four times a day (QID) | ORAL | Status: DC | PRN

## 2015-12-14 MED ORDER — FUROSEMIDE 40 MG PO TABS
40.0000 mg | ORAL_TABLET | Freq: Every day | ORAL | Status: DC
  Administered 2015-12-14: 40 mg via ORAL
  Filled 2015-12-14 (×2): qty 1

## 2015-12-14 MED ORDER — ALBUTEROL SULFATE (2.5 MG/3ML) 0.083% IN NEBU: 2.5000 mg | INHALATION_SOLUTION | Freq: Four times a day (QID) | RESPIRATORY_TRACT | Status: DC | PRN

## 2015-12-14 MED ORDER — IPRATROPIUM BROMIDE 0.02 % IN SOLN
1.0000 mg | Freq: Once | RESPIRATORY_TRACT | Status: AC
  Administered 2015-12-14: 1 mg via RESPIRATORY_TRACT
  Filled 2015-12-14: qty 5

## 2015-12-14 NOTE — H&P (Signed)
[facility] History and Physical  DAVANNA HE DJS:[id number] DOB: 16-Oct-1940 DOA: 12/14/2015  Referring physician: Dr. 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 Patient reports having increased wheezing and dyspnea on exertion that progressed to Inc. dyspneic at rest and orthopnea. She reported nonproductive cough. She ran out of her inhaler and was unable to get it from her PCP as office was closed for the past 2 days. She denies any fevers but had some chills with poor appetite. Patient denies headache, dizziness, fever, nausea , vomiting, chest pain, palpitations, abdominal pain, bowel or urinary symptoms. Denies any sick contacts or recent travel. She denies choking on food. EMS found that patient was hypoxic at 84% on room air. Received some treatment and without but patient was found to be in respiratory distress. On exam in the ED patient was cachectic and using axillary muscles of respiration. Breath sounds were minimal. She was placed on BiPAP and given continuous DuoNeb and IV magnesium. She had received IV Solu Medrol by EMS. ABGs done showed pH of 7.38, PCO2 42.6 and PO2 of 115. Blood work showed normal WBC, hemoglobin, platelets of 144. Chemistry was unremarkable. EKG showed normal sinus rhythm. A chest x-ray done in the ED showed multifocal pneumonia. Patient was shortly transitioned to nasal cannula (3L). Hospitalist admission requested to telemetry.   Review of Systems:  Constitutional: Denies fever, chills, diaphoresis, appetite change and fatigue.  HEENT: Nonproductive cough, Denies visual or hearing symptoms, congestion, sore throat,  difficulty swallowing, neck pain or stiffness  Respiratory: Dyspnea on exertion, orthopnea, nonproductive cough, wheezing.   Cardiovascular: Denies chest pain, palpitations , mild leg swelling+.  Gastrointestinal: Denies nausea, vomiting, abdominal pain, diarrhea, constipation, blood in stool and abdominal distention.  Genitourinary: Denies dysuria, urgency, frequency, hematuria, flank pain and difficulty urinating.  Endocrine: Denies: hot or cold intolerance,  polyuria, polydipsia. Musculoskeletal: Denies myalgias, back pain, joint swelling, arthralgias and gait problem.  Skin: Denies pallor, rash and wound.  Neurological: Denies dizziness, seizures, syncope, weakness, light-headedness, numbness and headaches.  Hematological: Denies adenopathy.  Psychiatric/Behavioral: Denies confusion  Past Medical History  Diagnosis Date  . Hypertension   . Bladder tumor   . Arthritis   . H/O pulmonary edema     jan 2014--  acute CHF w/ pulmonary edema  . Dyspnea on exertion   . Nocturnal oxygen desaturation     uses O2  HS via [place]--  and PRN daytime  . Nocturia   . Type 2 diabetes mellitus ([place])   . Ureteral tumor   . CHF (congestive heart failure) ([place])     NO CARDIOLOGIST---  MONITORED BY PCP DR Jacelyn Grip  . Hyperthyroidism     currently no meds -- labs stable  . COPD with emphysema ([place])     last Acute Exacerbation 08-21-2014   Past Surgical History  Procedure Laterality Date  . Cystoscopy  03/23/2012    Procedure: CYSTOSCOPY;  Surgeon: Claybon Jabs, MD;  Location: [facility];  Service: Urology;  Laterality: N/A;  . Transurethral resection of bladder tumor  03/23/2012    Procedure: TRANSURETHRAL RESECTION OF BLADDER TUMOR (TURBT);  Surgeon: Claybon Jabs, MD;  Location: [facility];  Service: Urology;  Laterality: N/A;  . Transthoracic echocardiogram  06-21-2013    moderate LVH,  grade I diastolic dysfunction, ef 74-08%,  mild MR & TR  . Transurethral resection of bladder tumor N/A 01/08/2013     Procedure: TRANSURETHRAL RESECTION OF BLADDER TUMOR (TURBT);  Surgeon: Claybon Jabs, MD;  Location: [facility];  Service: Urology;  Laterality: N/A;  . Cataract extraction w/phaco Left 01/31/2014    Procedure: CATARACT EXTRACTION PHACO AND INTRAOCULAR LENS PLACEMENT (IOC);  Surgeon: Tonny Branch, MD;  Location: [facility];  Service: Ophthalmology;  Laterality: Left;  CDE: 23.78  . Cataract extraction w/phaco Right 02/28/2014    Procedure: CATARACT EXTRACTION PHACO AND INTRAOCULAR LENS PLACEMENT (IOC);  Surgeon: Tonny Branch, MD;  Location: [facility];  Service: Ophthalmology;  Laterality: Right;  CDE:  11.34  . Abdominal hysterectomy  1980'S    W/ BILATERAL SALPINGO-OOPHORECTOMY  . Cystoscopy with retrograde pyelogram, ureteroscopy and stent placement Right 10/21/2014    Procedure: CYSTOSCOPY WITH RETROGRADE PYELOGRAM, URETEROSCOPY, BLADDER TUMOR BIOPSY;  Surgeon: Claybon Jabs, MD;  Location: [place];  Service: Urology;  Laterality: Right;   Social History:  reports that she quit smoking about 9 years ago. Her smoking use included Cigarettes. She has a 50 pack-year smoking history. She has never used smokeless tobacco. She reports that she does not drink alcohol or use illicit drugs.  No Known Allergies  Family History  Problem Relation Age of Onset  . Diabetes Mother   . Diabetes Father     Prior to Admission medications   Medication Sig Start Date End Date Taking? Authorizing Provider  carvedilol (COREG) 3.125 MG tablet TAKE 1 TABLET BY MOUTH TWICE DAILY 11/05/15  Yes Wardell Honour, MD  Cholecalciferol (VITAMIN D3) 2000 UNITS TABS Take 1 tablet by mouth daily.   Yes Historical Provider, MD  furosemide (LASIX) 40 MG tablet TAKE 1 TABLET BY MOUTH ONCE DAILY 11/04/15  Yes Wardell Honour, MD  KLOR-CON M20 20 MEQ tablet TAKE 1 TABLET (20 MEQ TOTAL) BY MOUTH DAILY. 12/05/15  Yes Wardell Honour, MD  metFORMIN (GLUCOPHAGE) 500 MG tablet Take 1 tablet (500 mg total) by mouth  daily. 11/04/15  Yes Wardell Honour, MD  albuterol (PROVENTIL HFA;VENTOLIN HFA) 108 (90 BASE) MCG/ACT inhaler Inhale 1-2 puffs into the lungs every 6 (six) hours as needed for wheezing or shortness of breath. 10/25/14   Wardell Honour, MD  Fluticasone Furoate-Vilanterol 100-25 MCG/INH AEPB Inhale 1 Inhaler into the lungs 1 day or 1 dose. Patient not taking: Reported on 12/14/2015 08/07/15   Wardell Honour, MD     Physical Exam:  Filed Vitals:   12/14/15 1300 12/14/15 1330 12/14/15 1400 12/14/15 1430  BP: 132/58 117/57 115/54 118/62  Pulse: 76 81 83 86  Temp:      TempSrc:      Resp: '22 19 21 21  '$ Height:      Weight:      SpO2: 100% 94% 95% 94%    Constitutional: Vital signs reviewed.  Elderly female lying in bed not in distress, appears fatigued HEENT: no pallor, no icterus, moist oral mucosa, no cervical lymphadenopathy on no JVD Chest: Diminished bilateral breath sounds, no added sounds CVS: Normal S1 and S2, no murmurs rub or gallop Abdominal: Soft. Non-tender, non-distended, bowel sounds are normal Ext: warm, trace edema  Neurological: A&O x3, non focal  Labs on Admission:  Basic Metabolic Panel:  Recent Labs Lab 12/14/15 1232  NA 138  K  4.2  CL 104  CO2 27  GLUCOSE 88  BUN 20  CREATININE 0.66  CALCIUM 8.8*   Liver Function Tests:  Recent Labs Lab 12/14/15 1232  AST 21  ALT 12*  ALKPHOS 61  BILITOT 0.8  PROT 8.7*  ALBUMIN 3.9   No results for input(s): LIPASE, AMYLASE in the last 168 hours. No results for input(s): AMMONIA in the last 168 hours. CBC:  Recent Labs Lab 12/14/15 1232  WBC 4.3  HGB 12.4  HCT 39.9  MCV 87.9  PLT 144*   Cardiac Enzymes: No results for input(s): CKTOTAL, CKMB, CKMBINDEX, TROPONINI in the last 168 hours. BNP: Invalid input(s): POCBNP CBG: No results for input(s): GLUCAP in the last 168 hours.  Radiological Exams on Admission: Dg Chest Port 1 View  12/14/2015  CLINICAL DATA:  75 year old female with a history of  shortness of breath and respiratory distress. EXAM: PORTABLE CHEST 1 VIEW COMPARISON:  08/21/2014 FINDINGS: Cardiomediastinal silhouette unchanged in size and contour. Atherosclerosis of the aortic arch. Interval development of bilateral lower lung opacities. No displaced fracture. IMPRESSION: Evidence of multifocal pneumonia, worst on the right. Aspiration pneumonia may be considered. Followup PA and lateral chest X-ray is recommended in 3-4 weeks following trial of antibiotic therapy to ensure resolution and exclude underlying malignancy. Signed, Dulcy Fanny. Earleen Newport, DO [facility] Radiology Electronically Signed   By: Corrie Mckusick D.O.   On: 12/14/2015 13:16    EKG: Normal sinus rhythm, no ST-T changes  Assessment/Plan  Principal Problem: Acute on chronic hypoxic respiratory failure Secondary to multifocal pneumonia and mild COPD exacerbation. Admit on telemetry. Empiric IV Rocephin and azithromycin. Continue O2 via nasal cannula , scheduled DuoNeb. Resume home inhaler. Follow blood culture, urine for strep and legionella antigen, sputum culture and HIV antibody. Besides acute respiratory failure she does not have any constitutional symptoms to explain multifocal pneumonia. I will check a chest CT without contrast to evaluate better. Supportive care with Tylenol and antitussives.  Active Problems: Essential hypertension Stable. Resume home medications  History of diastolic CHF Has mild lower extremity edema. BNP normal. On oral Lasix at home, switch to IV. Monitor I/O. Continue Coreg.  Diabetes mellitus Hold metformin. Check A1C, monitor fsg. continue SSI  Diet:cardiac/diabetic  DVT prophylaxis: sq lovenox   Code Status: Full code Family Communication: None at bedside Disposition Plan: Admit to telemetry  Runa Whittingham, [facility] Pager [phone]  Total time spent on admission :70 minutes  If 7PM-7AM, please contact  night-coverage [email or web address] Password [email or web address] 12/14/2015, 3:06 PM

## 2015-12-14 NOTE — ED Notes (Signed)
Resp distress significantly improved. Non-labored. Trial off bi-pap onto nasal cannula.  Satting 94% on 2 lpm. Resp therapy notified.   Pt states she feels much better.

## 2015-12-14 NOTE — ED Notes (Signed)
6 yof presents from home via EMS in severe respiratory distress. PMHx CHF, COPD on home o2 prn, DM. EMS states pt has been short of breath x 1 week, no improvement with inhalers. EMS found pt in severe distress, satting 84% on room air.   EMS gave : Albuterol x 1, duoneb x 1, solumedrol '125mg'$ /dl.

## 2015-12-14 NOTE — ED Notes (Signed)
MD at bedside re-evaluating pt and updating family.

## 2015-12-14 NOTE — ED Provider Notes (Signed)
CSN: [id number]     Arrival date & time 12/14/15  1218 History   First MD Initiated Contact with Patient 12/14/15 1222     Chief Complaint  Patient presents with  . Shortness of Breath     (Consider location/radiation/quality/duration/timing/severity/associated sxs/prior Treatment) Patient is a 75 y.o. female presenting with shortness of breath. The history is provided by the patient.  Shortness of Breath Severity:  Severe Onset quality:  Gradual Duration:  1 week Timing:  Constant Progression:  Worsening Chronicity:  Recurrent Ineffective treatments:  Oxygen and inhaler Associated symptoms: cough and wheezing   Associated symptoms: no abdominal pain, no chest pain, no fever, no rash and no vomiting     Past Medical History  Diagnosis Date  . Hypertension   . Bladder tumor   . Arthritis   . H/O pulmonary edema     jan 2014--  acute CHF w/ pulmonary edema  . Dyspnea on exertion   . Nocturnal oxygen desaturation     uses O2  HS via [place]--  and PRN daytime  . Nocturia   . Type 2 diabetes mellitus ([place])   . Ureteral tumor   . CHF (congestive heart failure) ([place])     NO CARDIOLOGIST---  MONITORED BY PCP DR Jacelyn Grip  . Hyperthyroidism     currently no meds -- labs stable  . COPD with emphysema ([place])     last Acute Exacerbation 08-21-2014   Past Surgical History  Procedure Laterality Date  . Cystoscopy  03/23/2012    Procedure: CYSTOSCOPY;  Surgeon: Claybon Jabs, MD;  Location: [facility];  Service: Urology;  Laterality: N/A;  . Transurethral resection of bladder tumor  03/23/2012    Procedure: TRANSURETHRAL RESECTION OF BLADDER TUMOR (TURBT);  Surgeon: Claybon Jabs, MD;  Location: [facility];  Service: Urology;  Laterality: N/A;  . Transthoracic echocardiogram  06-21-2013    moderate LVH,  grade I diastolic dysfunction, ef 40-98%,  mild MR & TR  . Transurethral resection of bladder tumor N/A 01/08/2013    Procedure: TRANSURETHRAL RESECTION OF BLADDER TUMOR (TURBT);  Surgeon: Claybon Jabs, MD;  Location: [facility];  Service: Urology;  Laterality: N/A;  . Cataract extraction w/phaco Left 01/31/2014    Procedure: CATARACT EXTRACTION PHACO AND INTRAOCULAR LENS PLACEMENT (IOC);  Surgeon: Tonny Branch, MD;  Location: [facility];  Service: Ophthalmology;  Laterality: Left;  CDE: 23.78  . Cataract extraction w/phaco Right 02/28/2014    Procedure: CATARACT EXTRACTION PHACO AND INTRAOCULAR LENS PLACEMENT (IOC);  Surgeon: Tonny Branch, MD;  Location: [facility];  Service: Ophthalmology;  Laterality: Right;  CDE:  11.34  . Abdominal hysterectomy  1980'S    W/ BILATERAL SALPINGO-OOPHORECTOMY  . Cystoscopy with retrograde pyelogram, ureteroscopy and stent placement Right 10/21/2014    Procedure: CYSTOSCOPY WITH RETROGRADE PYELOGRAM, URETEROSCOPY, BLADDER TUMOR BIOPSY;  Surgeon: Claybon Jabs, MD;  Location: [facility];  Service: Urology;  Laterality: Right;   Family History  Problem Relation Age of Onset  . Diabetes Mother   . Diabetes Father    Social History  Substance Use Topics  . Smoking status: Former Smoker -- 1.00 packs/day for 50 years    Types: Cigarettes    Quit date: 09/24/2006  . Smokeless tobacco: Never Used  . Alcohol Use: No   OB History    No data available     Review of Systems  Unable to perform ROS: Acuity of condition  Constitutional: Negative for fever and chills.  Respiratory: Positive for cough, shortness of breath and wheezing.   Cardiovascular: Positive for leg swelling. Negative for chest pain.  Gastrointestinal: Negative for vomiting and abdominal pain.  Skin: Negative for rash.  All other systems reviewed and are negative.     Allergies  Review of patient's allergies indicates no known allergies.  Home Medications   Prior to Admission medications   Medication Sig Start Date End Date Taking? Authorizing Provider  carvedilol (COREG) 3.125 MG tablet TAKE 1 TABLET BY MOUTH TWICE DAILY 11/05/15  Yes Wardell Honour, MD   Cholecalciferol (VITAMIN D3) 2000 UNITS TABS Take 1 tablet by mouth daily.   Yes Historical Provider, MD  furosemide (LASIX) 40 MG tablet TAKE 1 TABLET BY MOUTH ONCE DAILY 11/04/15  Yes Wardell Honour, MD  KLOR-CON M20 20 MEQ tablet TAKE 1 TABLET (20 MEQ TOTAL) BY MOUTH DAILY. 12/05/15  Yes Wardell Honour, MD  metFORMIN (GLUCOPHAGE) 500 MG tablet Take 1 tablet (500 mg total) by mouth daily. 11/04/15  Yes Wardell Honour, MD  albuterol (PROVENTIL HFA;VENTOLIN HFA) 108 (90 BASE) MCG/ACT inhaler Inhale 1-2 puffs into the lungs every 6 (six) hours as needed for wheezing or shortness of breath. 10/25/14   Wardell Honour, MD  Fluticasone Furoate-Vilanterol 100-25 MCG/INH AEPB Inhale 1 Inhaler into the lungs 1 day or 1 dose. Patient not taking: Reported on 12/14/2015 08/07/15   Wardell Honour, MD   BP 132/61 mmHg  Pulse 86  Temp(Src) 98.2 F (36.8 C) (Oral)  Resp 21  Ht '5\' 6"'$  (1.676 m)  Wt 176 lb (79.833 kg)  BMI 28.42 kg/m2  SpO2 91% Physical Exam  Constitutional: She is oriented to person, place, and time. She appears well-developed and well-nourished. She appears distressed.  HENT:  Head: Normocephalic and atraumatic.  Neck: Normal range of motion.  Cardiovascular: Normal rate and regular rhythm.   Pulmonary/Chest: Accessory muscle usage present. No stridor. Tachypnea noted. She is in respiratory distress. She has decreased breath sounds (significantly). She has wheezes (minimal).  Abdominal: Soft. She exhibits no distension.  Musculoskeletal: Normal range of motion. She exhibits no edema or tenderness.  Neurological: She is alert and oriented to person, place, and time.  Skin: Skin is warm and dry. She is not diaphoretic.  Nursing note and vitals reviewed.   ED Course  Procedures (including critical care time)  CRITICAL CARE Performed by: Merrily Pew  Total critical care time: 45 minutes Critical care time was exclusive of separately billable procedures and treating other  patients. Critical care was necessary to treat or prevent imminent or life-threatening deterioration. Critical care was time spent personally by me on the following activities: development of treatment plan with patient and/or surrogate as well as nursing, discussions with consultants, evaluation of patient's response to treatment, examination of patient, obtaining history from patient or surrogate, ordering and performing treatments and interventions, ordering and review of laboratory studies, ordering and review of radiographic studies, pulse oximetry and re-evaluation of patient's condition.  Labs Review Labs Reviewed  CBC - Abnormal; Notable for the following:    Platelets 144 (*)    All other components within normal limits  COMPREHENSIVE METABOLIC PANEL - Abnormal; Notable for the following:    Calcium 8.8 (*)    Total Protein 8.7 (*)    ALT 12 (*)    All other components within normal limits  BLOOD GAS, ARTERIAL - Abnormal; Notable for the following:    pO2, Arterial 115 (*)    Bicarbonate 24.9 (*)  All other components within normal limits  GLUCOSE, CAPILLARY - Abnormal; Notable for the following:    Glucose-Capillary 211 (*)    All other components within normal limits  GLUCOSE, CAPILLARY - Abnormal; Notable for the following:    Glucose-Capillary 126 (*)    All other components within normal limits  CULTURE, BLOOD (ROUTINE X 2)  CULTURE, BLOOD (ROUTINE X 2)  CULTURE, EXPECTORATED SPUTUM-ASSESSMENT  GRAM STAIN  BRAIN NATRIURETIC PEPTIDE  TSH  HIV ANTIBODY (ROUTINE TESTING)  STREP PNEUMONIAE URINARY ANTIGEN  LEGIONELLA PNEUMOPHILA SEROGP 1 UR AG  BASIC METABOLIC PANEL  CBC  I-STAT TROPOININ, ED    Imaging Review Ct Chest Wo Contrast  12/14/2015  CLINICAL DATA:  Inpatient with pneumonia. COPD. CHF. Bladder tumor. EXAM: CT CHEST WITHOUT CONTRAST TECHNIQUE: Multidetector CT imaging of the chest was performed following the standard protocol without IV contrast.  COMPARISON:  Chest radiograph from earlier today. 03/22/2012 chest CT angiogram. FINDINGS: Study is motion degraded. Mediastinum/Nodes: Mild cardiomegaly. No pericardial fluid/thickening. Three-vessel coronary atherosclerosis. Atherosclerotic nonaneurysmal thoracic aorta. Dilated main pulmonary artery (3.5 cm diameter), not appreciably changed. Multinodular goiter with dominant coarsely calcified 3.2 cm right thyroid lobe nodule, not definitely changed. Normal esophagus. No pathologically enlarged axillary, mediastinal or gross hilar lymph nodes, noting limited sensitivity for the detection of hilar adenopathy on this noncontrast study. Lungs/Pleura: No pneumothorax. No pleural effusion. Moderate centrilobular emphysema and diffuse bronchial wall thickening. Stable coarsely calcified subcentimeter granuloma in the posterior right upper lobe. Mild patchy consolidation in the basilar right lower lobe. Left lower lobe 4 mm solid pulmonary nodule (series 5/ image 92), not definitely seen on 03/22/2012 chest CT. Mild patchy consolidation in the dependent left lower lobe. No additional significant pulmonary nodules. Upper abdomen: Unremarkable. Musculoskeletal: No aggressive appearing focal osseous lesions. Moderate degenerative changes in the thoracic spine. IMPRESSION: 1. Motion degraded study. Patchy consolidation in the bilateral lower lobes, favor multifocal infectious or aspiration pneumonia. Recommend follow-up PA and lateral post treatment chest radiographs in 4-6 weeks. 2. Moderate emphysema and diffuse bronchial wall thickening, in keeping with known COPD. 3. New left lower lobe 4 mm solid pulmonary nodule. Non-contrast chest CT can be considered in 12 months in this high-risk patient. This recommendation follows the consensus statement: Guidelines for Management of Incidental Pulmonary Nodules Detected on CT Images:From the Fleischner Society 2017; published online before print (10.1148/radiol.[id number]). 4.  Mild cardiomegaly with 3 vessel coronary atherosclerosis. 5. Stable main pulmonary artery dilatation, suggesting chronic pulmonary arterial hypertension. 6. Stable multinodular goiter. Electronically Signed   By: Ilona Sorrel M.D.   On: 12/14/2015 16:34   Dg Chest Port 1 View  12/14/2015  CLINICAL DATA:  75 year old female with a history of shortness of breath and respiratory distress. EXAM: PORTABLE CHEST 1 VIEW COMPARISON:  08/21/2014 FINDINGS: Cardiomediastinal silhouette unchanged in size and contour. Atherosclerosis of the aortic arch. Interval development of bilateral lower lung opacities. No displaced fracture. IMPRESSION: Evidence of multifocal pneumonia, worst on the right. Aspiration pneumonia may be considered. Followup PA and lateral chest X-ray is recommended in 3-4 weeks following trial of antibiotic therapy to ensure resolution and exclude underlying malignancy. Signed, Dulcy Fanny. Earleen Newport, DO [facility] Electronically Signed   By: Corrie Mckusick D.O.   On: 12/14/2015 13:16   I have personally reviewed and evaluated these images and lab results as part of my medical decision-making.   EKG Interpretation   Date/Time:  Sunday December 14 2015 12:24:09 EDT Ventricular Rate:  81 PR  Interval:  208 QRS Duration: 91 QT Interval:  391 QTC Calculation: 454 R Axis:   86 Text Interpretation:  Sinus rhythm Anterior infarct, old Confirmed by  Wardell Pokorski MD, Corene Cornea [phone]) on 12/14/2015 1:11:32 PM      MDM   Final diagnoses:  SOB (shortness of breath)  Hypoxia  Acute respiratory failure with hypoxia (HCC)   Significant respiratory distress with EMS initial sat of 84% on room air. Some treatments given en route but per EMS worsening respiratory status. On arrival here with minimal breath sounds bilaterally, significant accessory muscle usage.   Most likely bronchoconstriction, less likely CHF at this point. Immediately started on BiPaP,  continuous albuterol, ipratropium and magnesium. Solumedrol already initiated by EMS.  Will obtain ABG, ecg, portable chest, troponin, bnp, cmp and close reevaluation for improvement or need for ventilation.   Reevaluation, ABG looks appropriate, patient much improved and more comfortable and alert. CXR w/ e/o multifocal pneumonia (rocephin/azithro already ordered). Significant improvement in dyspnea on bipap so will continue and try to wean down to nasal cannula.   On 2L [place] satting in low to mid 90's. Will admit to medicine.     Merrily Pew, MD 12/14/15 2229

## 2015-12-15 DIAGNOSIS — N3289 Other specified disorders of bladder: Secondary | ICD-10-CM

## 2015-12-15 LAB — BASIC METABOLIC PANEL
Anion gap: 7 (ref 5–15)
BUN: 20 mg/dL (ref 6–20)
CHLORIDE: 104 mmol/L (ref 101–111)
CO2: 28 mmol/L (ref 22–32)
Calcium: 8.9 mg/dL (ref 8.9–10.3)
Creatinine, Ser: 0.77 mg/dL (ref 0.44–1.00)
GFR calc Af Amer: >60 mL/min (ref >60)
GFR calc non Af Amer: >60 mL/min (ref >60)
GLUCOSE: 113 mg/dL — AB (ref 65–99)
POTASSIUM: 4.2 mmol/L (ref 3.5–5.1)
Sodium: 139 mmol/L (ref 135–145)

## 2015-12-15 LAB — GLUCOSE, CAPILLARY
Glucose-Capillary: 105 mg/dL — ABNORMAL HIGH (ref 65–99)
Glucose-Capillary: 97 mg/dL (ref 65–99)
Glucose-Capillary: 93 mg/dL (ref 65–99)
Glucose-Capillary: 143 mg/dL — ABNORMAL HIGH (ref 65–99)

## 2015-12-15 LAB — CBC
HEMATOCRIT: 35.9 % — AB (ref 36.0–46.0)
HEMOGLOBIN: 11.6 g/dL — AB (ref 12.0–15.0)
MCH: 28.2 pg (ref 26.0–34.0)
MCHC: 32.3 g/dL (ref 30.0–36.0)
MCV: 87.3 fL (ref 78.0–100.0)
Platelets: 143 10*3/uL — ABNORMAL LOW (ref 150–400)
RBC: 4.11 MIL/uL (ref 3.87–5.11)
RDW: 14.3 % (ref 11.5–15.5)
WBC: 4.4 10*3/uL (ref 4.0–10.5)

## 2015-12-15 LAB — STREP PNEUMONIAE URINARY ANTIGEN: Strep Pneumo Urinary Antigen: NEGATIVE

## 2015-12-15 MED ORDER — SODIUM CHLORIDE 0.9% FLUSH
3.0000 mL | Freq: Two times a day (BID) | INTRAVENOUS | Status: DC
  Administered 2015-12-15 – 2015-12-18 (×7): 3 mL via INTRAVENOUS

## 2015-12-15 MED ORDER — SODIUM CHLORIDE 0.9 % IV SOLN: 250.0000 mL | INTRAVENOUS | Status: DC | PRN

## 2015-12-15 MED ORDER — SODIUM CHLORIDE 0.9% FLUSH
3.0000 mL | INTRAVENOUS | Status: DC | PRN
  Administered 2015-12-15 – 2015-12-17 (×2): 3 mL via INTRAVENOUS
  Filled 2015-12-15 (×2): qty 3

## 2015-12-15 NOTE — Progress Notes (Signed)
ANTIBIOTIC CONSULT NOTE  Pharmacy Consult for Renal Adjustment of ABX if needed  ABX:  Rocephin and Zithromax  No Known Allergies  Patient Measurements: Height: '5\' 6"'$  (167.6 cm) Weight: 176 lb (79.833 kg) IBW/kg (Calculated) : 59.3  Vital Signs: Temp: 98.5 F (36.9 C) (04/17 0623) Temp Source: Oral (04/17 0623) BP: 121/49 mmHg (04/17 0623) Pulse Rate: 55 (04/17 0623)  Labs:  Recent Labs  12/14/15 1232 12/15/15 0615  WBC 4.3 4.4  HGB 12.4 11.6*  PLT 144* 143*  CREATININE 0.66 0.77   No results for input(s): VANCOTROUGH, VANCOPEAK, VANCORANDOM, GENTTROUGH, GENTPEAK, GENTRANDOM, TOBRATROUGH, TOBRAPEAK, TOBRARND, AMIKACINPEAK, AMIKACINTROU, AMIKACIN in the last 72 hours.   Medical History: Past Medical History  Diagnosis Date  . Hypertension   . Bladder tumor   . Arthritis   . H/O pulmonary edema     jan 2014--  acute CHF w/ pulmonary edema  . Dyspnea on exertion   . Nocturnal oxygen desaturation     uses O2  HS via [place]--  and PRN daytime  . Nocturia   . Type 2 diabetes mellitus ([place])   . Ureteral tumor   . CHF (congestive heart failure) ([place])     NO CARDIOLOGIST---  MONITORED BY PCP DR Jacelyn Grip  . Hyperthyroidism     currently no meds -- labs stable  . COPD with emphysema ([place])     last Acute Exacerbation 08-21-2014   Assessment: No renal adjustment needed for Rocephin and Zithromax  Estimated Creatinine Clearance: 63.8 mL/min (by C-G formula based on Cr of 0.77).  Plan: Continue current Rx F/U cultures and progress  Ena Dawley, [facility] 12/15/2015

## 2015-12-15 NOTE — Progress Notes (Signed)
[facility] PROGRESS NOTE  Sonya Oliver EPP:[id number] DOB: 1941/01/11    PCP:   Wardell Honour, MD   HPI: Sonya Oliver is an 75 y.o. female with hx of COPD on home oxygen, s/p type II DM, hyperthyroidism on no meds, hx of bladder tumor, admitted for multifocal PNA, and COPD exacerbation, given IV Steroids, Mag, oxygen, and nebs.  She was given IV lasix for acute on chronic diastolic CHF (mild) as well.  She is doing better, but is still wheezing this morning.   Rewiew of Systems:  Constitutional: Negative for malaise, fever and chills. No significant weight loss or weight gain Eyes: Negative for eye pain, redness and discharge, diplopia, visual changes, or flashes of light. ENMT: Negative for ear pain, hoarseness, nasal congestion, sinus pressure and sore throat. No headaches; tinnitus, drooling, or problem swallowing. Cardiovascular: Negative for chest pain, palpitations, diaphoresis, dyspnea and peripheral edema. ; No orthopnea, PND Respiratory: Negative for cough, hemoptysis, wheezing and stridor. No pleuritic chestpain. Gastrointestinal: Negative for nausea, vomiting, diarrhea, constipation, abdominal pain, melena, blood in stool, hematemesis, jaundice and rectal bleeding.    Genitourinary: Negative for frequency, dysuria, incontinence,flank pain and hematuria; Musculoskeletal: Negative for back pain and neck pain. Negative for swelling and trauma.;  Skin: . Negative for pruritus, rash, abrasions, bruising and skin lesion.; ulcerations Neuro: Negative for headache, lightheadedness and neck stiffness. Negative for weakness, altered level of consciousness , altered mental status, extremity weakness, burning feet, involuntary movement, seizure and syncope.  Psych: negative for anxiety, depression, insomnia, tearfulness, panic attacks, hallucinations, paranoia, suicidal or homicidal ideation    Past Medical History  Diagnosis Date  . Hypertension   . Bladder tumor   .  Arthritis   . H/O pulmonary edema     jan 2014--  acute CHF w/ pulmonary edema  . Dyspnea on exertion   . Nocturnal oxygen desaturation     uses O2  HS via [place]--  and PRN daytime  . Nocturia   . Type 2 diabetes mellitus ([place])   . Ureteral tumor   . CHF (congestive heart failure) ([place])     NO CARDIOLOGIST---  MONITORED BY PCP DR Jacelyn Grip  . Hyperthyroidism     currently no meds -- labs stable  . COPD with emphysema ([place])     last Acute Exacerbation 08-21-2014    Past Surgical History  Procedure Laterality Date  . Cystoscopy  03/23/2012    Procedure: CYSTOSCOPY;  Surgeon: Claybon Jabs, MD;  Location: [facility];  Service: Urology;  Laterality: N/A;  . Transurethral resection of bladder tumor  03/23/2012    Procedure: TRANSURETHRAL RESECTION OF BLADDER TUMOR (TURBT);  Surgeon: Claybon Jabs, MD;  Location: [facility];  Service: Urology;  Laterality: N/A;  . Transthoracic echocardiogram  06-21-2013    moderate LVH,  grade I diastolic dysfunction, ef 60-63%,  mild MR & TR  . Transurethral resection of bladder tumor N/A 01/08/2013    Procedure: TRANSURETHRAL RESECTION OF BLADDER TUMOR (TURBT);  Surgeon: Claybon Jabs, MD;  [facility]  Service: Urology;  Laterality: N/A;  . Cataract extraction w/phaco Left 01/31/2014    Procedure: CATARACT EXTRACTION PHACO AND INTRAOCULAR LENS PLACEMENT (IOC);  Surgeon: Tonny Branch, MD;  Location: [facility];  Service: Ophthalmology;  Laterality: Left;  CDE: 23.78  . Cataract extraction w/phaco Right 02/28/2014    Procedure: CATARACT EXTRACTION PHACO AND INTRAOCULAR LENS PLACEMENT (IOC);  Surgeon: Tonny Branch, MD;  Location: [facility];  Service: Ophthalmology;  Laterality: Right;  CDE:  11.34  . Abdominal hysterectomy  1980'S    W/ BILATERAL SALPINGO-OOPHORECTOMY  . Cystoscopy with retrograde pyelogram, ureteroscopy and stent placement Right 10/21/2014    Procedure: CYSTOSCOPY WITH RETROGRADE PYELOGRAM, URETEROSCOPY, BLADDER TUMOR BIOPSY;  Surgeon: Claybon Jabs, MD;  Location: [facility];  Service: Urology;  Laterality: Right;    Medications:  HOME MEDS: Prior to Admission medications   Medication Sig Start Date End Date Taking? Authorizing Provider  carvedilol (COREG) 3.125 MG tablet TAKE 1 TABLET BY MOUTH TWICE DAILY 11/05/15  Yes Wardell Honour, MD  Cholecalciferol (VITAMIN D3) 2000 UNITS TABS Take 1 tablet by mouth daily.   Yes Historical Provider, MD  furosemide (LASIX) 40 MG tablet TAKE 1 TABLET BY MOUTH ONCE DAILY 11/04/15  Yes Wardell Honour, MD  KLOR-CON M20 20 MEQ tablet TAKE 1 TABLET (20 MEQ TOTAL) BY MOUTH DAILY. 12/05/15  Yes Wardell Honour, MD  metFORMIN (GLUCOPHAGE) 500 MG tablet Take 1 tablet (500 mg total) by mouth daily. 11/04/15  Yes Wardell Honour, MD  albuterol (PROVENTIL HFA;VENTOLIN HFA) 108 (90 BASE) MCG/ACT inhaler Inhale 1-2 puffs into the lungs every 6 (six) hours as needed for wheezing or shortness of breath. 10/25/14   Wardell Honour, MD  Fluticasone Furoate-Vilanterol 100-25 MCG/INH AEPB Inhale 1 Inhaler into the lungs 1 day or 1 dose. Patient not taking: Reported on 12/14/2015 08/07/15   Wardell Honour, MD     Allergies:  No Known Allergies  Social History:   reports that she quit smoking about 9 years ago. Her smoking use included Cigarettes. She has a 50 pack-year smoking history. She has never used smokeless tobacco. She reports that she does not drink alcohol or use illicit drugs.  Family History: Family History  Problem Relation Age of Onset  . Diabetes Mother   . Diabetes Father      Physical Exam: Filed Vitals:   12/14/15 2247 12/15/15 0149 12/15/15 0623 12/15/15 0751  BP: 130/61  121/49   Pulse: 98  55   Temp: 97.9 F (36.6 C)  98.5 F (36.9 C)   TempSrc: Oral  Oral   Resp: 20  20   Height:      Weight:      SpO2: 100% 98% 100% 98%   Blood pressure 121/49, pulse 55, temperature 98.5 F (36.9 C), temperature source Oral, resp. rate 20, height '5\' 6"'$  (1.676 m),  weight 79.833 kg (176 lb), SpO2 98 %.  GEN:  Pleasant  patient lying in the stretcher in no acute distress; cooperative with exam. PSYCH:  alert and oriented x4; does not appear anxious or depressed; affect is appropriate. HEENT: Mucous membranes pink and anicteric; PERRLA; EOM intact; no cervical lymphadenopathy nor thyromegaly or carotid bruit; no JVD; There were no stridor. Neck is very supple. Breasts:: Not examined CHEST WALL: No tenderness CHEST: Normal respiration, no rales, bilateral wheezing.  HEART: Regular rate and rhythm.  There are no murmur, rub, or gallops.   BACK: No kyphosis or scoliosis; no CVA tenderness ABDOMEN: soft and non-tender; no masses, no organomegaly, normal abdominal bowel sounds; no pannus; no intertriginous candida. There is no rebound and no distention. Rectal Exam: Not done EXTREMITIES: No bone or joint deformity; age-appropriate arthropathy of the hands and knees; no edema; no ulcerations.  There is no calf tenderness. Genitalia: not examined PULSES: 2+ and symmetric SKIN: Normal hydration no rash or ulceration CNS: Cranial nerves 2-12 grossly intact no focal lateralizing  neurologic deficit.  Speech is fluent; uvula elevated with phonation, facial symmetry and tongue midline. DTR are normal bilaterally, cerebella exam is intact, barbinski is negative and strengths are equaled bilaterally.  No sensory loss.   Labs on Admission:  Basic Metabolic Panel:  Recent Labs Lab 12/14/15 1232 12/15/15 0615  NA 138 139  K 4.2 4.2  CL 104 104  CO2 27 28  GLUCOSE 88 113*  BUN 20 20  CREATININE 0.66 0.77  CALCIUM 8.8* 8.9   Liver Function Tests:  Recent Labs Lab 12/14/15 1232  AST 21  ALT 12*  ALKPHOS 61  BILITOT 0.8  PROT 8.7*  ALBUMIN 3.9   CBC:  Recent Labs Lab 12/14/15 1232 12/15/15 0615  WBC 4.3 4.4  HGB 12.4 11.6*  HCT 39.9 35.9*  MCV 87.9 87.3  PLT 144* 143*   CBG:  Recent Labs Lab 12/14/15 1815 12/14/15 2055 12/15/15 0749   GLUCAP 211* 126* 105*     Radiological Exams on Admission: Ct Chest Wo Contrast  12/14/2015  CLINICAL DATA:  Inpatient with pneumonia. COPD. CHF. Bladder tumor. EXAM: CT CHEST WITHOUT CONTRAST TECHNIQUE: Multidetector CT imaging of the chest was performed following the standard protocol without IV contrast. COMPARISON:  Chest radiograph from earlier today. 03/22/2012 chest CT angiogram. FINDINGS: Study is motion degraded. Mediastinum/Nodes: Mild cardiomegaly. No pericardial fluid/thickening. Three-vessel coronary atherosclerosis. Atherosclerotic nonaneurysmal thoracic aorta. Dilated main pulmonary artery (3.5 cm diameter), not appreciably changed. Multinodular goiter with dominant coarsely calcified 3.2 cm right thyroid lobe nodule, not definitely changed. Normal esophagus. No pathologically enlarged axillary, mediastinal or gross hilar lymph nodes, noting limited sensitivity for the detection of hilar adenopathy on this noncontrast study. Lungs/Pleura: No pneumothorax. No pleural effusion. Moderate centrilobular emphysema and diffuse bronchial wall thickening. Stable coarsely calcified subcentimeter granuloma in the posterior right upper lobe. Mild patchy consolidation in the basilar right lower lobe. Left lower lobe 4 mm solid pulmonary nodule (series 5/ image 92), not definitely seen on 03/22/2012 chest CT. Mild patchy consolidation in the dependent left lower lobe. No additional significant pulmonary nodules. Upper abdomen: Unremarkable. Musculoskeletal: No aggressive appearing focal osseous lesions. Moderate degenerative changes in the thoracic spine. IMPRESSION: 1. Motion degraded study. Patchy consolidation in the bilateral lower lobes, favor multifocal infectious or aspiration pneumonia. Recommend follow-up PA and lateral post treatment chest radiographs in 4-6 weeks. 2. Moderate emphysema and diffuse bronchial wall thickening, in keeping with known COPD. 3. New left lower lobe 4 mm solid pulmonary  nodule. Non-contrast chest CT can be considered in 12 months in this high-risk patient. This recommendation follows the consensus statement: Guidelines for Management of Incidental Pulmonary Nodules Detected on CT Images:From the Fleischner Society 2017; published online before print (10.1148/radiol.[id number]). 4. Mild cardiomegaly with 3 vessel coronary atherosclerosis. 5. Stable main pulmonary artery dilatation, suggesting chronic pulmonary arterial hypertension. 6. Stable multinodular goiter. Electronically Signed   By: Ilona Sorrel M.D.   On: 12/14/2015 16:34   Dg Chest Port 1 View  12/14/2015  CLINICAL DATA:  75 year old female with a history of shortness of breath and respiratory distress. EXAM: PORTABLE CHEST 1 VIEW COMPARISON:  08/21/2014 FINDINGS: Cardiomediastinal silhouette unchanged in size and contour. Atherosclerosis of the aortic arch. Interval development of bilateral lower lung opacities. No displaced fracture. IMPRESSION: Evidence of multifocal pneumonia, worst on the right. Aspiration pneumonia may be considered. Followup PA and lateral chest X-ray is recommended in 3-4 weeks following trial of antibiotic therapy to ensure resolution and exclude underlying malignancy. Signed, Dulcy Fanny. Earleen Newport, DO  [facility] Radiology Electronically Signed   By: Corrie Mckusick D.O.   On: 12/14/2015 13:16    EKG: Independently reviewed.   Assessment/Plan Present on Admission:  . Hypoxia . Acute-on-chronic respiratory failure ([place]) . Bladder mass . COPD (chronic obstructive pulmonary disease) ([place]) . HTN (hypertension) . Lobar pneumonia due to unspecified organism . Acute respiratory failure with hypoxia ([place]) . Acute respiratory failure (H[facility])  PLAN:  Multifocal PNA:  Continue with IV antibiotics, nebs, and continue with IV steroids for COPD exacerbation as well.   COPD with exacerbation:  Was given IV Mag, nebs, and IV steroids.  Will continue.   She is on home oxygen, and lives at home with her children.   DM2:  SSI, expect needing coverage given IV Steroids.  CHF:  Acute on chronic diastolic CHF.  Continue with IV Lasix.  Follow lytes and renal fx carefully.   Other plans as per orders. Code Status: FULL Haskel Khan, MD.  FACP [facility] Pager [phone] 7pm to 7am.  12/15/2015, 9:54 AM

## 2015-12-15 NOTE — Care Management Important Message (Signed)
Important Message  Patient Details  Name: Sonya Oliver MRN: [id number] Date of Birth: [birth year]   Medicare Important Message Given:  Yes    Alvie Heidelberg, RN 12/15/2015, 5:01 PM

## 2015-12-16 DIAGNOSIS — I5033 Acute on chronic diastolic (congestive) heart failure: Secondary | ICD-10-CM

## 2015-12-16 LAB — GLUCOSE, CAPILLARY
GLUCOSE-CAPILLARY: 104 mg/dL — AB (ref 65–99)
GLUCOSE-CAPILLARY: 140 mg/dL — AB (ref 65–99)
Glucose-Capillary: 145 mg/dL — ABNORMAL HIGH (ref 65–99)
Glucose-Capillary: 122 mg/dL — ABNORMAL HIGH (ref 65–99)

## 2015-12-16 LAB — HIV ANTIBODY (ROUTINE TESTING W REFLEX): HIV SCREEN 4TH GENERATION: NONREACTIVE

## 2015-12-16 LAB — LEGIONELLA PNEUMOPHILA SEROGP 1 UR AG: L. PNEUMOPHILA SEROGP 1 UR AG: NEGATIVE

## 2015-12-16 MED ORDER — FUROSEMIDE 40 MG PO TABS
40.0000 mg | ORAL_TABLET | Freq: Every day | ORAL | Status: DC
Start: 2015-12-16 — End: 2015-12-18
  Administered 2015-12-16 – 2015-12-18 (×3): 40 mg via ORAL
  Filled 2015-12-16 (×3): qty 1

## 2015-12-16 MED ORDER — PREDNISONE 20 MG PO TABS
40.0000 mg | ORAL_TABLET | Freq: Every day | ORAL | Status: DC
  Administered 2015-12-17 – 2015-12-18 (×2): 40 mg via ORAL
  Filled 2015-12-16 (×2): qty 2

## 2015-12-16 NOTE — Progress Notes (Signed)
[facility] PROGRESS NOTE  Sonya Oliver NLG:[id number] DOB: March 06, 1941    PCP:   Wardell Honour, MD   HPI:  Sonya Oliver is an 75 y.o. female with hx of COPD on home oxygen, s/p type II DM, hyperthyroidism on no meds, hx of bladder tumor, admitted for multifocal PNAs, and COPD exacerbation, given IV Steroids, Mag, oxygen, and nebs. She was given IV lasix for acute on chronic diastolic CHF (mild) as well. She is doing better, but is still wheezing this morning. She want to go home tomorrow.   Rewiew of Systems:  Constitutional: Negative for malaise, fever and chills. No significant weight loss or weight gain Eyes: Negative for eye pain, redness and discharge, diplopia, visual changes, or flashes of light. ENMT: Negative for ear pain, hoarseness, nasal congestion, sinus pressure and sore throat. No headaches; tinnitus, drooling, or problem swallowing. Cardiovascular: Negative for chest pain, palpitations, diaphoresis,  and peripheral edema. ; No orthopnea, PND Respiratory: Negative for hemoptysis,  and stridor. No pleuritic chestpain. Gastrointestinal: Negative for nausea, vomiting, diarrhea, constipation, abdominal pain, melena, blood in stool, hematemesis, jaundice and rectal bleeding.    Genitourinary: Negative for frequency, dysuria, incontinence,flank pain and hematuria; Musculoskeletal: Negative for back pain and neck pain. Negative for swelling and trauma.;  Skin: . Negative for pruritus, rash, abrasions, bruising and skin lesion.; ulcerations Neuro: Negative for headache, lightheadedness and neck stiffness. Negative for weakness, altered level of consciousness , altered mental status, extremity weakness, burning feet, involuntary movement, seizure and syncope.  Psych: negative for anxiety, depression, insomnia, tearfulness, panic attacks, hallucinations, paranoia, suicidal or homicidal ideation    Past Medical History  Diagnosis Date  . Hypertension   . Bladder  tumor   . Arthritis   . H/O pulmonary edema     jan 2014--  acute CHF w/ pulmonary edema  . Dyspnea on exertion   . Nocturnal oxygen desaturation     uses O2  HS via [place]--  and PRN daytime  . Nocturia   . Type 2 diabetes mellitus ([place])   . Ureteral tumor   . CHF (congestive heart failure) ([place])     NO CARDIOLOGIST---  MONITORED BY PCP DR Jacelyn Grip  . Hyperthyroidism     currently no meds -- labs stable  . COPD with emphysema ([place])     last Acute Exacerbation 08-21-2014    Past Surgical History  Procedure Laterality Date  . Cystoscopy  03/23/2012    Procedure: CYSTOSCOPY;  Surgeon: Claybon Jabs, MD;  [facility];  Service: Urology;  Laterality: N/A;  . Transurethral resection of bladder tumor  03/23/2012    Procedure: TRANSURETHRAL RESECTION OF BLADDER TUMOR (TURBT);  Surgeon: Claybon Jabs, MD;  [facility];  Service: Urology;  Laterality: N/A;  . Transthoracic echocardiogram  06-21-2013    moderate LVH,  grade I diastolic dysfunction, ef 08-14%,  mild MR & TR  . Transurethral resection of bladder tumor N/A 01/08/2013    Procedure: TRANSURETHRAL RESECTION OF BLADDER TUMOR (TURBT);  Surgeon: Claybon Jabs, MD;  Location: [facility];  Service: Urology;  Laterality: N/A;  . Cataract extraction w/phaco Left 01/31/2014    Procedure: CATARACT EXTRACTION PHACO AND INTRAOCULAR LENS PLACEMENT (IOC);  Surgeon: Tonny Branch, MD;  Location[facility];  Service: Ophthalmology;  Laterality: Left;  CDE: 23.78  . Cataract extraction w/phaco Right 02/28/2014    Procedure: CATARACT EXTRACTION PHACO AND INTRAOCULAR LENS PLACEMENT (IOC);  Surgeon: Tonny Branch, MD;  Location: [facility];  Service: Ophthalmology;  Laterality: Right;  CDE:  11.34  . Abdominal hysterectomy  1980'S    W/ BILATERAL SALPINGO-OOPHORECTOMY  . Cystoscopy with retrograde pyelogram, ureteroscopy and stent placement Right 10/21/2014    Procedure: CYSTOSCOPY WITH RETROGRADE PYELOGRAM, URETEROSCOPY, BLADDER TUMOR BIOPSY;   Surgeon: Claybon Jabs, MD;  Location: [place];  Service: Urology;  Laterality: Right;    Medications:  HOME MEDS: Prior to Admission medications   Medication Sig Start Date End Date Taking? Authorizing Provider  carvedilol (COREG) 3.125 MG tablet TAKE 1 TABLET BY MOUTH TWICE DAILY 11/05/15  Yes Wardell Honour, MD  Cholecalciferol (VITAMIN D3) 2000 UNITS TABS Take 1 tablet by mouth daily.   Yes Historical Provider, MD  furosemide (LASIX) 40 MG tablet TAKE 1 TABLET BY MOUTH ONCE DAILY 11/04/15  Yes Wardell Honour, MD  KLOR-CON M20 20 MEQ tablet TAKE 1 TABLET (20 MEQ TOTAL) BY MOUTH DAILY. 12/05/15  Yes Wardell Honour, MD  metFORMIN (GLUCOPHAGE) 500 MG tablet Take 1 tablet (500 mg total) by mouth daily. 11/04/15  Yes Wardell Honour, MD  albuterol (PROVENTIL HFA;VENTOLIN HFA) 108 (90 BASE) MCG/ACT inhaler Inhale 1-2 puffs into the lungs every 6 (six) hours as needed for wheezing or shortness of breath. 10/25/14   Wardell Honour, MD  Fluticasone Furoate-Vilanterol 100-25 MCG/INH AEPB Inhale 1 Inhaler into the lungs 1 day or 1 dose. Patient not taking: Reported on 12/14/2015 08/07/15   Wardell Honour, MD     Allergies:  No Known Allergies  Social History:   reports that she quit smoking about 9 years ago. Her smoking use included Cigarettes. She has a 50 pack-year smoking history. She has never used smokeless tobacco. She reports that she does not drink alcohol or use illicit drugs.  Family History: Family History  Problem Relation Age of Onset  . Diabetes Mother   . Diabetes Father      Physical Exam: Filed Vitals:   12/16/15 0530 12/16/15 0826 12/16/15 1414 12/16/15 1425  BP: 122/67   137/65  Pulse: 74   79  Temp: 98.1 F (36.7 C)   98.1 F (36.7 C)  TempSrc: Oral     Resp: 20   20  Height:      Weight:      SpO2: 94% 97% 97% 96%   Blood pressure 137/65, pulse 79, temperature 98.1 F (36.7 C), temperature source Oral, resp. rate 20, height '5\' 6"'$   (1.676 m), weight 79.833 kg (176 lb), SpO2 96 %.  GEN:  Pleasant patient lying in the stretcher in no acute distress; cooperative with exam. PSYCH:  alert and oriented x4; does not appear anxious or depressed; affect is appropriate. HEENT: Mucous membranes pink and anicteric; PERRLA; EOM intact; no cervical lymphadenopathy nor thyromegaly or carotid bruit; no JVD; There were no stridor. Neck is very supple. Breasts:: Not examined CHEST WALL: No tenderness CHEST: Normal respiration, some wheezing, no rales.  HEART: Regular rate and rhythm.  There are no murmur, rub, or gallops.   BACK: No kyphosis or scoliosis; no CVA tenderness ABDOMEN: soft and non-tender; no masses, no organomegaly, normal abdominal bowel sounds; no pannus; no intertriginous candida. There is no rebound and no distention. Rectal Exam: Not done EXTREMITIES: No bone or joint deformity; age-appropriate arthropathy of the hands and knees; no edema; no ulcerations.  There is no calf tenderness. Genitalia: not examined PULSES: 2+ and symmetric SKIN: Normal hydration no rash or ulceration CNS: Cranial nerves 2-12 grossly intact no  focal lateralizing neurologic deficit.  Speech is fluent; uvula elevated with phonation, facial symmetry and tongue midline. DTR are normal bilaterally, cerebella exam is intact, barbinski is negative and strengths are equaled bilaterally.  No sensory loss.   Labs on Admission:  Basic Metabolic Panel:  Recent Labs Lab 12/14/15 1232 12/15/15 0615  NA 138 139  K 4.2 4.2  CL 104 104  CO2 27 28  GLUCOSE 88 113*  BUN 20 20  CREATININE 0.66 0.77  CALCIUM 8.8* 8.9   Liver Function Tests:  Recent Labs Lab 12/14/15 1232  AST 21  ALT 12*  ALKPHOS 61  BILITOT 0.8  PROT 8.7*  ALBUMIN 3.9   CBC:  Recent Labs Lab 12/14/15 1232 12/15/15 0615  WBC 4.3 4.4  HGB 12.4 11.6*  HCT 39.9 35.9*  MCV 87.9 87.3  PLT 144* 143*   CBG:  Recent Labs Lab 12/15/15 1132 12/15/15 1714 12/15/15 2017  12/16/15 0736 12/16/15 1201  GLUCAP 97 93 143* 104* 140*   Assessment/Plan Present on Admission:  . Hypoxia . Acute-on-chronic respiratory failure ([place]) . Bladder mass . COPD (chronic obstructive pulmonary disease) ([place]) . HTN (hypertension) . Lobar pneumonia due to unspecified organism . Acute respiratory failure with hypoxia ([place]) . Acute respiratory failure (H[facility])  PLAN:  Multilobar PNA:  She is getting better, and likely be able to go home tomorrow.   I will continue IV antibiotic for today.  She has home oxygen at home.  COPD exacerbation:  Will change IV Steroid to Prednisone '40mg'$  per day.  Please taper upon discharge.   HTN:   Stable.  Mild acute on chronic CHF:  I will resume her Lasix orally from IV anticipating going home.    Other plans as per orders. Code Status:FULL CODE.    Orvan Falconer, MD.  FACP [facility] Pager [phone] 7pm to 7am.  12/16/2015, 4:01 PM

## 2015-12-17 DIAGNOSIS — J441 Chronic obstructive pulmonary disease with (acute) exacerbation: Secondary | ICD-10-CM

## 2015-12-17 DIAGNOSIS — J181 Lobar pneumonia, unspecified organism: Secondary | ICD-10-CM

## 2015-12-17 DIAGNOSIS — J9601 Acute respiratory failure with hypoxia: Secondary | ICD-10-CM

## 2015-12-17 DIAGNOSIS — I10 Essential (primary) hypertension: Secondary | ICD-10-CM

## 2015-12-17 DIAGNOSIS — E118 Type 2 diabetes mellitus with unspecified complications: Secondary | ICD-10-CM

## 2015-12-17 LAB — GLUCOSE, CAPILLARY
GLUCOSE-CAPILLARY: 185 mg/dL — AB (ref 65–99)
GLUCOSE-CAPILLARY: 188 mg/dL — AB (ref 65–99)
GLUCOSE-CAPILLARY: 149 mg/dL — AB (ref 65–99)
Glucose-Capillary: 69 mg/dL (ref 65–99)
Glucose-Capillary: 92 mg/dL (ref 65–99)

## 2015-12-17 NOTE — Progress Notes (Signed)
[facility] PROGRESS NOTE  OLEAN [name]:[id number] DOB: June 18, 1941    PCP:   Wardell Honour, MD   HPI:  Sonya Oliver is an 75 y.o. female with hx of COPD on home oxygen, s/p type II DM, hyperthyroidism on no meds, hx of bladder tumor, admitted for multifocal PNAs, and COPD exacerbation, given IV Steroids, Mag, oxygen, and nebs. She was given IV lasix for acute on chronic diastolic CHF (mild) as well. Currently, she is feeling improved but still mildly short of breath. She continues to have nonproductive cough. She feels wheezing is improving.    Past Medical History  Diagnosis Date  . Hypertension   . Bladder tumor   . Arthritis   . H/O pulmonary edema     jan 2014--  acute CHF w/ pulmonary edema  . Dyspnea on exertion   . Nocturnal oxygen desaturation     uses O2  HS via [place]--  and PRN daytime  . Nocturia   . Type 2 diabetes mellitus ([place])   . Ureteral tumor   . CHF (congestive heart failure) ([place])     NO CARDIOLOGIST---  MONITORED BY PCP DR Jacelyn Grip  . Hyperthyroidism     currently no meds -- labs stable  . COPD with emphysema ([place])     last Acute Exacerbation 08-21-2014    Past Surgical History  Procedure Laterality Date  . Cystoscopy  03/23/2012    Procedure: CYSTOSCOPY;  Surgeon: Claybon Jabs, MD;  Location: [facility];  Service: Urology;  Laterality: N/A;  . Transurethral resection of bladder tumor  03/23/2012    Procedure: TRANSURETHRAL RESECTION OF BLADDER TUMOR (TURBT);  Surgeon: Claybon Jabs, MD;  Location: [facility];  Service: Urology;  Laterality: N/A;  . Transthoracic echocardiogram  06-21-2013    moderate LVH,  grade I diastolic dysfunction, ef 31-54%,  mild MR & TR  . Transurethral resection of bladder tumor N/A 01/08/2013    Procedure: TRANSURETHRAL RESECTION OF BLADDER TUMOR (TURBT);  Surgeon: Claybon Jabs, MD;  Location: [facility];  Service: Urology;  Laterality: N/A;  . Cataract extraction w/phaco Left 01/31/2014    Procedure:  CATARACT EXTRACTION PHACO AND INTRAOCULAR LENS PLACEMENT (IOC);  Surgeon: Tonny Branch, MD;  Location: [facility];  Service: Ophthalmology;  Laterality: Left;  CDE: 23.78  . Cataract extraction w/phaco Right 02/28/2014    Procedure: CATARACT EXTRACTION PHACO AND INTRAOCULAR LENS PLACEMENT (IOC);  Surgeon: Tonny Branch, MD;  Location: [facility];  Service: Ophthalmology;  Laterality: Right;  CDE:  11.34  . Abdominal hysterectomy  1980'S    W/ BILATERAL SALPINGO-OOPHORECTOMY  . Cystoscopy with retrograde pyelogram, ureteroscopy and stent placement Right 10/21/2014    Procedure: CYSTOSCOPY WITH RETROGRADE PYELOGRAM, URETEROSCOPY, BLADDER TUMOR BIOPSY;  Surgeon: Claybon Jabs, MD;  Location: [facility];  Service: Urology;  Laterality: Right;    Medications:  HOME MEDS: Prior to Admission medications   Medication Sig Start Date End Date Taking? Authorizing Provider  carvedilol (COREG) 3.125 MG tablet TAKE 1 TABLET BY MOUTH TWICE DAILY 11/05/15  Yes Wardell Honour, MD  Cholecalciferol (VITAMIN D3) 2000 UNITS TABS Take 1 tablet by mouth daily.   Yes Historical Provider, MD  furosemide (LASIX) 40 MG tablet TAKE 1 TABLET BY MOUTH ONCE DAILY 11/04/15  Yes Wardell Honour, MD  KLOR-CON M20 20 MEQ tablet TAKE 1 TABLET (20 MEQ TOTAL) BY MOUTH DAILY. 12/05/15  Yes Wardell Honour, MD  metFORMIN (GLUCOPHAGE) 500 MG tablet Take  1 tablet (500 mg total) by mouth daily. 11/04/15  Yes Wardell Honour, MD  albuterol (PROVENTIL HFA;VENTOLIN HFA) 108 (90 BASE) MCG/ACT inhaler Inhale 1-2 puffs into the lungs every 6 (six) hours as needed for wheezing or shortness of breath. 10/25/14   Wardell Honour, MD  Fluticasone Furoate-Vilanterol 100-25 MCG/INH AEPB Inhale 1 Inhaler into the lungs 1 day or 1 dose. Patient not taking: Reported on 12/14/2015 08/07/15   Wardell Honour, MD     Allergies:  No Known Allergies  Social History:   reports that she quit smoking about 9 years ago. Her smoking use included Cigarettes.  She has a 50 pack-year smoking history. She has never used smokeless tobacco. She reports that she does not drink alcohol or use illicit drugs.  Family History: Family History  Problem Relation Age of Onset  . Diabetes Mother   . Diabetes Father      Physical Exam: Filed Vitals:   12/17/15 0539 12/17/15 0733 12/17/15 1332 12/17/15 1619  BP: 115/58   137/62  Pulse: 63   65  Temp: 98.1 F (36.7 C)   97.9 F (36.6 C)  TempSrc: Oral   Oral  Resp: 18   19  Height:      Weight:      SpO2: 98% 98% 98% 99%   Blood pressure 137/62, pulse 65, temperature 97.9 F (36.6 C), temperature source Oral, resp. rate 19, height '5\' 6"'$  (1.676 m), weight 79.833 kg (176 lb), SpO2 99 %.  GEN:  Pleasant patient lying in the stretcher in no acute distress; cooperative with exam. PSYCH:  alert and oriented x4; does not appear anxious or depressed; affect is appropriate. HEENT: Mucous membranes pink and anicteric; PERRLA; EOM intact; no cervical lymphadenopathy nor thyromegaly or carotid bruit; no JVD; There were no stridor. Neck is very supple. Breasts:: Not examined CHEST WALL: No tenderness CHEST: Normal respiration. Diminished breath sounds with mild wheeze bilaterally  HEART: Regular rate and rhythm.  There are no murmur, rub, or gallops.   BACK: No kyphosis or scoliosis; no CVA tenderness ABDOMEN: soft and non-tender; no masses, no organomegaly, normal abdominal bowel sounds; no pannus; no intertriginous candida. There is no rebound and no distention. Rectal Exam: Not done EXTREMITIES: No bone or joint deformity; age-appropriate arthropathy of the hands and knees; no edema; no ulcerations.  There is no calf tenderness. Genitalia: not examined PULSES: 2+ and symmetric SKIN: Normal hydration no rash or ulceration CNS: Grossly intact, nonfocal   Labs on Admission:  Basic Metabolic Panel:  Recent Labs Lab 12/14/15 1232 12/15/15 0615  NA 138 139  K 4.2 4.2  CL 104 104  CO2 27 28  GLUCOSE 88  113*  BUN 20 20  CREATININE 0.66 0.77  CALCIUM 8.8* 8.9   Liver Function Tests:  Recent Labs Lab 12/14/15 1232  AST 21  ALT 12*  ALKPHOS 61  BILITOT 0.8  PROT 8.7*  ALBUMIN 3.9   CBC:  Recent Labs Lab 12/14/15 1232 12/15/15 0615  WBC 4.3 4.4  HGB 12.4 11.6*  HCT 39.9 35.9*  MCV 87.9 87.3  PLT 144* 143*   CBG:  Recent Labs Lab 12/16/15 1626 12/16/15 2042 12/17/15 0748 12/17/15 1146 12/17/15 1315  GLUCAP 145* 122* 69 185* 188*   Assessment/Plan Present on Admission:  . Hypoxia . Acute-on-chronic respiratory failure ([employer]) . Bladder mass . COPD (chronic obstructive pulmonary disease) ([place]) . HTN (hypertension) . Lobar pneumonia due to unspecified organism . Acute respiratory failure with hypoxia (  [place]) . Acute respiratory failure ([place])  PLAN:  Multilobar PNA:  She is getting better, but still mildly short of breath.   Continue intravenous antibiotics for 1 more day. Anticipate transitioning to oral tomorrow..  She has home oxygen at home.  COPD exacerbation:  Still mildly short of breath. Continue current treatments with bronchodilators and steroids.[name]   HTN:   Stable.  Mild acute on chronic diastolic CHF:  Appears to be compensated. Lasix has been changed back to oral dosing..    Other plans as per orders. Code Status:FULL CODE.    Kathie Dike, MD.  FACP [facility] Pager [phone] 7pm to 7am.  12/17/2015, 5:40 PM

## 2015-12-18 DIAGNOSIS — J9621 Acute and chronic respiratory failure with hypoxia: Secondary | ICD-10-CM

## 2015-12-18 LAB — GLUCOSE, CAPILLARY
GLUCOSE-CAPILLARY: 80 mg/dL (ref 65–99)
Glucose-Capillary: 102 mg/dL — ABNORMAL HIGH (ref 65–99)

## 2015-12-18 MED ORDER — GUAIFENESIN ER 600 MG PO TB12: 600.0000 mg | ORAL_TABLET | Freq: Two times a day (BID) | ORAL | Status: DC

## 2015-12-18 MED ORDER — ALBUTEROL SULFATE HFA 108 (90 BASE) MCG/ACT IN AERS: 1.0000 | INHALATION_SPRAY | Freq: Four times a day (QID) | RESPIRATORY_TRACT | Status: DC | PRN

## 2015-12-18 MED ORDER — AMOXICILLIN-POT CLAVULANATE 875-125 MG PO TABS: 1.0000 | ORAL_TABLET | Freq: Two times a day (BID) | ORAL | Status: DC

## 2015-12-18 MED ORDER — FLUTICASONE FUROATE-VILANTEROL 100-25 MCG/INH IN AEPB
1.0000 | INHALATION_SPRAY | RESPIRATORY_TRACT | Status: DC
Start: 2015-12-18 — End: 2016-02-27

## 2015-12-18 MED ORDER — PREDNISONE 10 MG PO TABS: ORAL_TABLET | ORAL | Status: DC

## 2015-12-18 NOTE — Progress Notes (Signed)
Patient alert and oriented, independent, VSS, pt. Tolerating diet well. No complaints of pain or nausea. Pt. Had IV removed tip intact. Pt. Had prescriptions given. Pt. Voiced understanding of discharge instructions with no further questions. Pt. Discharged via wheelchair with auxilliary.

## 2015-12-18 NOTE — Discharge Summary (Signed)
Physician Discharge Summary  Sonya Oliver DGL:[id number] DOB: May 09, 1941 DOA: 12/14/2015  PCP: Wardell Honour, MD  Admit date: 12/14/2015 Discharge date: 12/18/2015  Time spent: 35 minutes  Recommendations for Outpatient Follow-up:  1. Follow up with PCP within 1-2 weeks. 2. Follow up for repeat CXR in 2-4 weeks 3. She will be discharged on a course of abx.  4. Family is requesting referral to Dr. Michela Pitcher, pulmonologist in [place], for further management of COPD. Will defer to PCP to make this referral.    Discharge Diagnoses:  Principal Problem:   Lobar pneumonia due to unspecified organism Active Problems:   Bladder mass   CHF (congestive heart failure) (H[facility])   COPD (chronic obstructive pulmonary disease) (H[facility])   HTN (hypertension)   Acute-on-chronic respiratory failure ([facility])   Hypoxia   Diabetes mellitus, type 2 ([place])   Acute respiratory failure with hypoxia ([place])   Acute respiratory failure ([place])   Discharge Condition: Improved   Diet recommendation: Heart healthy, carb modified   Filed Weights   12/14/15 1221  Weight: 79.833 kg (176 lb)    History of present illness:  75 yof with past medical history of COPD on home oxygen, DM type 2, hyperthyroidism, bladder tumor presented with with complaints of increased SOB, nonproductive cough, and DOE. En route via EMS she was noted to be hypoxic and on arrival noted to be in respiratory distress and placed on BiPAP. ABGs revealed pH of 7.38, PCO2 42.6 and PO2 of 115 and CXR revealed multifocal pneumonia. She was transitioned to 3L [place]  admitted for further treatment.   Hospital Course:  Sonya Oliver was found to have multifocal pneumonia, as revealed on her initial CXR. With abx therapy, her overall condition improved. She has been afebrile and resp status appears to be returning to baseline, and is breathing comfortably of 2L. She was subsequently started on IV antibiotics and transitioned from BiPAP to 3L [place]. She was  later transitioned to oral abx once she began to improve symptomatically. She appeared much improved by 4/20 and was able to breath comfortably on RA. She will be discharged with a course of abx to finish at home, and will need to follow up for a repeat CXR in 2-4 weeks.  1.  Acute on chronic respiratory failure. Patient initially required BiPAP in the ED. Has since been weaned down to baseline oxygen requirements.  2. COPD exacerbation. Improved. Started on IV abx and steroids, nebs, and mag. On discharge transitioned to oral abx and steroid taper. She plants to follow up with outpatient pulmonology.  3. Acute on chronic CHF. Initially treated with IV lasix with improvement of volume status. On discharge appeared compensated. Started on oral lasix and renal function was monitored.  4. DM type 2,started on SSI   Procedures:  None  Consultations:  None  Discharge Exam: Filed Vitals:   12/17/15 2123 12/18/15 0700  BP: 105/62 110/64  Pulse: 73 75  Temp: 98 F (36.7 C) 97.5 F (36.4 C)  Resp: 20 15   Examination:   General exam: Appears calm and comfortable  Respiratory system: Clear to auscultation. Respiratory effort normal. Cardiovascular system: S1 & S2 heard, RRR. No JVD, murmurs, rubs, gallops or clicks. No pedal edema. Gastrointestinal system: Abdomen is nondistended, soft and nontender. No organomegaly or masses felt. Normal bowel sounds heard. Central nervous system: Alert and oriented. No focal neurological deficits. Extremities: Symmetric 5 x 5 power. Skin: No rashes, lesions or ulcers Psychiatry: Judgement and insight appear normal.  Mood & affect appropriate.   Discharge Instructions   Discharge Instructions    Diet - low sodium heart healthy    Complete by:  As directed      Increase activity slowly    Complete by:  As directed           Current Discharge Medication List    START taking these medications   Details  amoxicillin-clavulanate (AUGMENTIN)  875-125 MG tablet Take 1 tablet by mouth 2 (two) times daily. Qty: 4 tablet, Refills: 0    guaiFENesin (MUCINEX) 600 MG 12 hr tablet Take 1 tablet (600 mg total) by mouth 2 (two) times daily. Qty: 20 tablet, Refills: 0    predniSONE (DELTASONE) 10 MG tablet Take '30mg'$  po daily for 2 days then '20mg'$  daily for 2 days then '10mg'$  daily for 2 days then stop Qty: 12 tablet, Refills: 0      CONTINUE these medications which have CHANGED   Details  albuterol (PROVENTIL HFA;VENTOLIN HFA) 108 (90 Base) MCG/ACT inhaler Inhale 1-2 puffs into the lungs every 6 (six) hours as needed for wheezing or shortness of breath. Qty: 1 Inhaler, Refills: 1    fluticasone furoate-vilanterol (BREO ELLIPTA) 100-25 MCG/INH AEPB Inhale 1 puff into the lungs 1 day or 1 dose. Qty: 1 each, Refills: 0      CONTINUE these medications which have NOT CHANGED   Details  carvedilol (COREG) 3.125 MG tablet TAKE 1 TABLET BY MOUTH TWICE DAILY Qty: 180 tablet, Refills: 0    Cholecalciferol (VITAMIN D3) 2000 UNITS TABS Take 1 tablet by mouth daily.    furosemide (LASIX) 40 MG tablet TAKE 1 TABLET BY MOUTH ONCE DAILY Qty: 90 tablet, Refills: 0    KLOR-CON M20 20 MEQ tablet TAKE 1 TABLET (20 MEQ TOTAL) BY MOUTH DAILY. Qty: 90 tablet, Refills: 1    metFORMIN (GLUCOPHAGE) 500 MG tablet Take 1 tablet (500 mg total) by mouth daily. Qty: 90 tablet, Refills: 0       No Known Allergies Follow-up Information    Follow up with Wardell Honour, MD. Schedule an appointment as soon as possible for a visit in 1 week.   Specialty:  Family Medicine   Contact information:   [place] [address] [phone]        The results of significant diagnostics from this hospitalization (including imaging, microbiology, ancillary and laboratory) are listed below for reference.    Significant Diagnostic Studies: Ct Chest Wo Contrast  12/14/2015  CLINICAL DATA:  Inpatient with pneumonia. COPD. CHF. Bladder tumor. EXAM: CT  CHEST WITHOUT CONTRAST TECHNIQUE: Multidetector CT imaging of the chest was performed following the standard protocol without IV contrast. COMPARISON:  Chest radiograph from earlier today. 03/22/2012 chest CT angiogram. FINDINGS: Study is motion degraded. Mediastinum/Nodes: Mild cardiomegaly. No pericardial fluid/thickening. Three-vessel coronary atherosclerosis. Atherosclerotic nonaneurysmal thoracic aorta. Dilated main pulmonary artery (3.5 cm diameter), not appreciably changed. Multinodular goiter with dominant coarsely calcified 3.2 cm right thyroid lobe nodule, not definitely changed. Normal esophagus. No pathologically enlarged axillary, mediastinal or gross hilar lymph nodes, noting limited sensitivity for the detection of hilar adenopathy on this noncontrast study. Lungs/Pleura: No pneumothorax. No pleural effusion. Moderate centrilobular emphysema and diffuse bronchial wall thickening. Stable coarsely calcified subcentimeter granuloma in the posterior right upper lobe. Mild patchy consolidation in the basilar right lower lobe. Left lower lobe 4 mm solid pulmonary nodule (series 5/ image 92), not definitely seen on 03/22/2012 chest CT. Mild patchy consolidation in the dependent left  lower lobe. No additional significant pulmonary nodules. Upper abdomen: Unremarkable. Musculoskeletal: No aggressive appearing focal osseous lesions. Moderate degenerative changes in the thoracic spine. IMPRESSION: 1. Motion degraded study. Patchy consolidation in the bilateral lower lobes, favor multifocal infectious or aspiration pneumonia. Recommend follow-up PA and lateral post treatment chest radiographs in 4-6 weeks. 2. Moderate emphysema and diffuse bronchial wall thickening, in keeping with known COPD. 3. New left lower lobe 4 mm solid pulmonary nodule. Non-contrast chest CT can be considered in 12 months in this high-risk patient. This recommendation follows the consensus statement: Guidelines for Management of  Incidental Pulmonary Nodules Detected on CT Images:From the Fleischner Society 2017; published online before print (10.1148/radiol.9563875643). 4. Mild cardiomegaly with 3 vessel coronary atherosclerosis. 5. Stable main pulmonary artery dilatation, suggesting chronic pulmonary arterial hypertension. 6. Stable multinodular goiter. Electronically Signed   By: Ilona Sorrel M.D.   On: 12/14/2015 16:34   Dg Chest Port 1 View  12/14/2015  CLINICAL DATA:  75 year old female with a history of shortness of breath and respiratory distress. EXAM: PORTABLE CHEST 1 VIEW COMPARISON:  08/21/2014 FINDINGS: Cardiomediastinal silhouette unchanged in size and contour. Atherosclerosis of the aortic arch. Interval development of bilateral lower lung opacities. No displaced fracture. IMPRESSION: Evidence of multifocal pneumonia, worst on the right. Aspiration pneumonia may be considered. Followup PA and lateral chest X-ray is recommended in 3-4 weeks following trial of antibiotic therapy to ensure resolution and exclude underlying malignancy. Signed, Dulcy Fanny. Earleen Newport, DO [facility] [facility] Electronically Signed   By: Corrie Mckusick D.O.   On: 12/14/2015 13:16    Microbiology: Recent Results (from the past 240 hour(s))  Culture, blood (routine x 2) Call MD if unable to obtain prior to antibiotics being given     Status: None (Preliminary result)   Collection Time: 12/14/15  5:23 PM  Result Value Ref Range Status   Specimen Description LEFT ANTECUBITAL  Final   Special Requests BOTTLES DRAWN AEROBIC AND ANAEROBIC 8CC EACH  Final   Culture NO GROWTH 4 DAYS  Final   Report Status PENDING  Incomplete  Culture, blood (routine x 2) Call MD if unable to obtain prior to antibiotics being given     Status: None (Preliminary result)   Collection Time: 12/14/15  5:25 PM  Result Value Ref Range Status   Specimen Description BLOOD LEFT HAND  Final   Special Requests BOTTLES DRAWN  AEROBIC AND ANAEROBIC [facility] EACH  Final   Culture NO GROWTH 4 DAYS  Final   Report Status PENDING  Incomplete     Labs: Basic Metabolic Panel:  Recent Labs Lab 12/14/15 1232 12/15/15 0615  NA 138 139  K 4.2 4.2  CL 104 104  CO2 27 28  GLUCOSE 88 113*  BUN 20 20  CREATININE 0.66 0.77  CALCIUM 8.8* 8.9   Liver Function Tests:  Recent Labs Lab 12/14/15 1232  AST 21  ALT 12*  ALKPHOS 61  BILITOT 0.8  PROT 8.7*  ALBUMIN 3.9   CBC:  Recent Labs Lab 12/14/15 1232 12/15/15 0615  WBC 4.3 4.4  HGB 12.4 11.6*  HCT 39.9 35.9*  MCV 87.9 87.3  PLT 144* 143*    BNP (last 3 results)  Recent Labs  12/14/15 1232  BNP 84.0    CBG:  Recent Labs Lab 12/17/15 1315 12/17/15 1734 12/17/15 2122 12/18/15 0748 12/18/15 1147  GLUCAP 188* 92 149* 80 102*       Signed:  Kathie Dike, MD  [facility] 12/18/2015, 1:12 PM  By signing my name below, I, Delene Ruffini, attest that this documentation has been prepared under the direction and in the presence of Kathie Dike, MD. Electronically Signed: Delene Ruffini 12/18/2015 11:55am\\  I, Dr. Kathie Dike, personally performed the services described in this documentaiton. All medical record entries made by the scribe were at my direction and in my presence. I have reviewed the chart and agree that the record reflects my personal performance and is accurate and complete  Kathie Dike, MD, 12/18/2015 1:12 PM

## 2015-12-18 NOTE — Care Management Important Message (Signed)
Important Message  Patient Details  Name: Sonya Oliver MRN: [id number] Date of Birth: 06-01-1941   Medicare Important Message Given:  Yes    Alvie Heidelberg, RN 12/18/2015, 12:25 PM

## 2015-12-18 NOTE — Care Management Note (Signed)
Case Management Note  Patient Details  Name: LOISANN ROACH MRN: [id number] Date of Birth: 05/13/1941  Subjective/Objective:        Patient alert and oriented from home with family support. Has home O2. Deniess issues with medications or transportation. Daughter in low in room and will assist patient home.             Action/Plan: Home with self care.   Expected Discharge Date:                  Expected Discharge Plan:  Home/Self Care  In-House Referral:     Discharge planning Services  CM Consult  Post Acute Care Choice:    Choice offered to:     DME Arranged:    DME Agency:     HH Arranged:    [place] Agency:     Status of Service:  Completed, signed off  Medicare Important Message Given:  Yes Date Medicare IM Given:    Medicare IM give by:    Date Additional Medicare IM Given:    Additional Medicare Important Message give by:     If discussed at [place] of Stay Meetings, dates discussed:    Additional Comments:  Alvie Heidelberg, RN 12/18/2015, 12:31 PM

## 2015-12-19 LAB — CULTURE, BLOOD (ROUTINE X 2)
CULTURE: NO GROWTH
Culture: NO GROWTH

## 2015-12-22 ENCOUNTER — Telehealth: Payer: Self-pay | Admitting: *Deleted

## 2015-12-22 NOTE — Telephone Encounter (Signed)
Call Completed and Appointment Scheduled: Yes, Date: 12/26/15 with Dr Adria Dill INFORMATION Date of Discharge:12/18/15  Discharge Facility: [facility]  Principal Discharge Diagnosis: Pneumonia  Patient and/or caregiver is knowledgeable of his/her condition(s) and treatment: Yes  MEDICATION RECONCILIATION Medication list reviewed with patient:Yes Discharge Medications reconciled with home medications.   Patient is able to obtain needed medications:Yes  ACTIVITIES OF DAILY LIVING  Is the patient able to perform his/her own ADLs: Yes.    Patient is receiving home health services: No.  PATIENT EDUCATION Questions/Concerns Discussed: Patient has completed antibiotics and will finish prednisone soon. She is feeling much better and her breathing has improved a lot. She will follow up sooner than her scheduled appt if anything changes

## 2015-12-26 ENCOUNTER — Ambulatory Visit (INDEPENDENT_AMBULATORY_CARE_PROVIDER_SITE_OTHER): Payer: Medicare Other | Admitting: Family Medicine

## 2015-12-26 ENCOUNTER — Encounter: Payer: Self-pay | Admitting: Family Medicine

## 2015-12-26 VITALS — BP 137/58 | HR 60 | Temp 98.0°F | Ht 66.0 in | Wt 178.6 lb

## 2015-12-26 DIAGNOSIS — I10 Essential (primary) hypertension: Secondary | ICD-10-CM | POA: Diagnosis not present

## 2015-12-26 DIAGNOSIS — I5033 Acute on chronic diastolic (congestive) heart failure: Secondary | ICD-10-CM

## 2015-12-26 DIAGNOSIS — Z794 Long term (current) use of insulin: Secondary | ICD-10-CM | POA: Diagnosis not present

## 2015-12-26 DIAGNOSIS — E118 Type 2 diabetes mellitus with unspecified complications: Secondary | ICD-10-CM

## 2015-12-26 DIAGNOSIS — J9601 Acute respiratory failure with hypoxia: Secondary | ICD-10-CM

## 2015-12-26 NOTE — Progress Notes (Signed)
Subjective:    Patient ID: Sonya Oliver, female    DOB: 02-14-1941, 75 y.o.   MRN: [id number]  HPI Recommendations for Outpatient Follow-up:  1. Follow up with PCP within 1-2 weeks. 2. Follow up for repeat CXR in 2-4 weeks 3. She will be discharged on a course of abx.  4. Family is requesting referral to Dr. Michela Pitcher, pulmonologist in [place], for further management of COPD. Will defer to PCP to make this referral.   Discharge Diagnoses:  Principal Problem:  Lobar pneumonia due to unspecified organism Active Problems:  Bladder mass  CHF (congestive heart failure) (HCC)  COPD (chronic obstructive pulmonary disease) (HCC)  HTN (hypertension)  Acute-on-chronic respiratory failure (HCC)  Hypoxia  Diabetes mellitus, type 2 ([place])  Acute respiratory failure with hypoxia (HCC)  Acute respiratory failure [facility])  Patient Active Problem List   Diagnosis Date Noted  . Hypoxia 12/14/2015  . Diabetes mellitus, type 2 ([place]) 12/14/2015  . Lobar pneumonia due to unspecified organism 12/14/2015  . Acute respiratory failure with hypoxia ([place]) 12/14/2015  . Acute respiratory failure ([place]) 12/14/2015  . Hypokalemia 08/22/2013  . Acute on chronic diastolic heart failure ([place]) 06/20/2013  . Acute-on-chronic respiratory failure ([place]) 06/20/2013  . CHF (congestive heart failure) ([place]) 12/19/2012  . COPD (chronic obstructive pulmonary disease) ([place]) 12/19/2012  . Unspecified vitamin D deficiency 12/19/2012  . HTN (hypertension) 12/19/2012  . SOB (shortness of breath) 09/12/2012  . Edema leg 09/12/2012  . Diabetes ([place]) 09/12/2012  . Anemia 03/21/2012  . Syncope 03/21/2012  . Hematuria, gross 02/23/2012  . Anemia associated with acute blood loss 02/23/2012  . Orthostatic hypotension 02/23/2012  . Bladder mass 02/23/2012  . Hyperthyroidism 02/23/2012   Outpatient Encounter Prescriptions as of 12/26/2015  Medication Sig  . albuterol (PROVENTIL HFA;VENTOLIN HFA) 108 (90  Base) MCG/ACT inhaler Inhale 1-2 puffs into the lungs every 6 (six) hours as needed for wheezing or shortness of breath.  . carvedilol (COREG) 3.125 MG tablet TAKE 1 TABLET BY MOUTH TWICE DAILY  . Cholecalciferol (VITAMIN D3) 2000 UNITS TABS Take 1 tablet by mouth daily.  . fluticasone furoate-vilanterol (BREO ELLIPTA) 100-25 MCG/INH AEPB Inhale 1 puff into the lungs 1 day or 1 dose.  . furosemide (LASIX) 40 MG tablet TAKE 1 TABLET BY MOUTH ONCE DAILY  . guaiFENesin (MUCINEX) 600 MG 12 hr tablet Take 1 tablet (600 mg total) by mouth 2 (two) times daily.  [name] KLOR-CON M20 20 MEQ tablet TAKE 1 TABLET (20 MEQ TOTAL) BY MOUTH DAILY.  . metFORMIN (GLUCOPHAGE) 500 MG tablet Take 1 tablet (500 mg total) by mouth daily.  . [DISCONTINUED] amoxicillin-clavulanate (AUGMENTIN) 875-125 MG tablet Take 1 tablet by mouth 2 (two) times daily.  . [DISCONTINUED] predniSONE (DELTASONE) 10 MG tablet Take '30mg'$  po daily for 2 days then '20mg'$  daily for 2 days then '10mg'$  daily for 2 days then stop   No facility-administered encounter medications on file as of 12/26/2015.      Review of Systems  Constitutional: Negative.   Respiratory: Positive for cough and shortness of breath.   Cardiovascular: Negative.   Neurological: Negative.   Psychiatric/Behavioral: Negative.        Objective:   Physical Exam  Constitutional: She is oriented to person, place, and time. She appears well-developed and well-nourished.  Cardiovascular: Normal rate and regular rhythm.   Pulmonary/Chest: Effort normal and breath sounds normal.  Neurological: She is alert and oriented to person, place, and time.  Assessment & Plan:  1. Acute on chronic diastolic congestive heart failure (HCC) Failure seems compensated at this time she does have some dependent edema today but has not taken her diuretic since she was going out to the doctor  2. Essential hypertension Blood pressure is good at 137/58  3. Acute respiratory failure  with hypoxia ([place]) Patient appears back at baseline. She has home oxygen which she wears at night or as needed if she gets short of breath. She denies any coughing currently. Would like to repeat chest x-ray but have suggested that we wait 2 more weeks  4. Type 2 diabetes mellitus with complication, with long-term current use of insulin ([place]) She took prednisone while in the hospital which probably affected her sugars but her last A1c was 5.9. She takes metformin.   Wardell Honour MD

## 2016-01-09 ENCOUNTER — Encounter: Payer: Self-pay | Admitting: Family Medicine

## 2016-01-09 ENCOUNTER — Ambulatory Visit (INDEPENDENT_AMBULATORY_CARE_PROVIDER_SITE_OTHER): Payer: Medicare Other

## 2016-01-09 ENCOUNTER — Ambulatory Visit (INDEPENDENT_AMBULATORY_CARE_PROVIDER_SITE_OTHER): Payer: Medicare Other | Admitting: Family Medicine

## 2016-01-09 VITALS — BP 142/68 | HR 72 | Temp 97.0°F | Ht 66.0 in | Wt 180.0 lb

## 2016-01-09 DIAGNOSIS — R0602 Shortness of breath: Secondary | ICD-10-CM

## 2016-01-09 NOTE — Progress Notes (Signed)
Subjective:    Patient ID: Eliseo Squires, female    DOB: Nov 21, 1940, 75 y.o.   MRN: [id number]  HPI Patient here today for 2 week follow up on pneumonia. She is feeling somewhat better. This patient was seen 1 week post hospitalization for pneumonia but I felt was too early to repeat her chest x-ray. I had asked her to come back today for re-x-ray for clearing. She still feels somewhat weak and short of breath. She uses her albuterol inhaler occasionally when she feels she needs it. She still has some mild cough which is occasionally productive.     Patient Active Problem List   Diagnosis Date Noted  . Hypoxia 12/14/2015  . Diabetes mellitus, type 2 ([place]) 12/14/2015  . Lobar pneumonia due to unspecified organism 12/14/2015  . Acute respiratory failure with hypoxia ([place]) 12/14/2015  . Acute respiratory failure ([place]) 12/14/2015  . Hypokalemia 08/22/2013  . Acute on chronic diastolic heart failure ([place]) 06/20/2013  . Acute-on-chronic respiratory failure ([place]) 06/20/2013  . CHF (congestive heart failure) ([place]) 12/19/2012  . COPD (chronic obstructive pulmonary disease) ([place]) 12/19/2012  . Unspecified vitamin D deficiency 12/19/2012  . HTN (hypertension) 12/19/2012  . SOB (shortness of breath) 09/12/2012  . Edema leg 09/12/2012  . Diabetes ([place]) 09/12/2012  . Anemia 03/21/2012  . Syncope 03/21/2012  . Hematuria, gross 02/23/2012  . Anemia associated with acute blood loss 02/23/2012  . Orthostatic hypotension 02/23/2012  . Bladder mass 02/23/2012  . Hyperthyroidism 02/23/2012   Outpatient Encounter Prescriptions as of 01/09/2016  Medication Sig  . albuterol (PROVENTIL HFA;VENTOLIN HFA) 108 (90 Base) MCG/ACT inhaler Inhale 1-2 puffs into the lungs every 6 (six) hours as needed for wheezing or shortness of breath.  . carvedilol (COREG) 3.125 MG tablet TAKE 1 TABLET BY MOUTH TWICE DAILY  . Cholecalciferol (VITAMIN D3) 2000 UNITS TABS Take 1 tablet by mouth daily.  . fluticasone  furoate-vilanterol (BREO ELLIPTA) 100-25 MCG/INH AEPB Inhale 1 puff into the lungs 1 day or 1 dose.  . furosemide (LASIX) 40 MG tablet TAKE 1 TABLET BY MOUTH ONCE DAILY  . guaiFENesin (MUCINEX) 600 MG 12 hr tablet Take 1 tablet (600 mg total) by mouth 2 (two) times daily.  [name] KLOR-CON M20 20 MEQ tablet TAKE 1 TABLET (20 MEQ TOTAL) BY MOUTH DAILY.  . metFORMIN (GLUCOPHAGE) 500 MG tablet Take 1 tablet (500 mg total) by mouth daily.   No facility-administered encounter medications on file as of 01/09/2016.     Review of Systems  Constitutional: Positive for fatigue.  HENT: Negative.   Eyes: Negative.   Respiratory: Positive for shortness of breath.   Cardiovascular: Negative.   Gastrointestinal: Negative.   Endocrine: Negative.   Genitourinary: Negative.   Musculoskeletal: Negative.   Skin: Negative.   Allergic/Immunologic: Negative.   Neurological: Positive for weakness.  Hematological: Negative.   Psychiatric/Behavioral: Negative.        Objective:   Physical Exam  Constitutional: She is oriented to person, place, and time. She appears well-developed and well-nourished.  Cardiovascular: Normal rate and regular rhythm.   Pulmonary/Chest: Effort normal and breath sounds normal.  Neurological: She is alert and oriented to person, place, and time.  Psychiatric: She has a normal mood and affect. Her behavior is normal.   BP 142/68 mmHg  Pulse 72  Temp(Src) 97 F (36.1 C) (Oral)  Ht '5\' 6"'$  (1.676 m)  Wt 180 lb (81.647 kg)  BMI 29.07 kg/m2  SpO2 96%  Assessment & Plan:  1. SOB (shortness of breath) There has been some interval clearing on chest x-ray but it is not resolved. With clinical improvement as well as radiographic improvement, I think we are on the road to recovery. Recheck in one month   - DG Chest 2 View; Future  Wardell Honour MD

## 2016-01-30 ENCOUNTER — Other Ambulatory Visit: Payer: Self-pay | Admitting: *Deleted

## 2016-01-30 ENCOUNTER — Other Ambulatory Visit: Payer: Self-pay | Admitting: Family Medicine

## 2016-01-30 MED ORDER — FUROSEMIDE 40 MG PO TABS: ORAL_TABLET | ORAL | Status: DC

## 2016-01-30 MED ORDER — METFORMIN HCL 500 MG PO TABS: 500.0000 mg | ORAL_TABLET | Freq: Every day | ORAL | Status: DC

## 2016-02-05 ENCOUNTER — Emergency Department (HOSPITAL_COMMUNITY): Payer: Medicare Other

## 2016-02-05 ENCOUNTER — Encounter (HOSPITAL_COMMUNITY): Payer: Self-pay

## 2016-02-05 ENCOUNTER — Inpatient Hospital Stay (HOSPITAL_COMMUNITY)
Admission: EM | Admit: 2016-02-05 | Discharge: 2016-02-07 | DRG: 190 | Disposition: A | Discharge status: Home / Self Care | Payer: Medicare Other | Attending: Internal Medicine | Admitting: Internal Medicine

## 2016-02-05 DIAGNOSIS — R600 Localized edema: Secondary | ICD-10-CM | POA: Diagnosis present

## 2016-02-05 DIAGNOSIS — Z833 Family history of diabetes mellitus: Secondary | ICD-10-CM

## 2016-02-05 DIAGNOSIS — J9601 Acute respiratory failure with hypoxia: Secondary | ICD-10-CM | POA: Diagnosis present

## 2016-02-05 DIAGNOSIS — I110 Hypertensive heart disease with heart failure: Secondary | ICD-10-CM | POA: Diagnosis present

## 2016-02-05 DIAGNOSIS — R0603 Acute respiratory distress: Secondary | ICD-10-CM

## 2016-02-05 DIAGNOSIS — Z87891 Personal history of nicotine dependence: Secondary | ICD-10-CM

## 2016-02-05 DIAGNOSIS — R0600 Dyspnea, unspecified: Secondary | ICD-10-CM | POA: Diagnosis not present

## 2016-02-05 DIAGNOSIS — E0590 Thyrotoxicosis, unspecified without thyrotoxic crisis or storm: Secondary | ICD-10-CM | POA: Diagnosis present

## 2016-02-05 DIAGNOSIS — I5033 Acute on chronic diastolic (congestive) heart failure: Secondary | ICD-10-CM | POA: Diagnosis present

## 2016-02-05 DIAGNOSIS — E118 Type 2 diabetes mellitus with unspecified complications: Secondary | ICD-10-CM

## 2016-02-05 DIAGNOSIS — Z7951 Long term (current) use of inhaled steroids: Secondary | ICD-10-CM

## 2016-02-05 DIAGNOSIS — Z7984 Long term (current) use of oral hypoglycemic drugs: Secondary | ICD-10-CM

## 2016-02-05 DIAGNOSIS — J9621 Acute and chronic respiratory failure with hypoxia: Secondary | ICD-10-CM | POA: Diagnosis present

## 2016-02-05 DIAGNOSIS — Z66 Do not resuscitate: Secondary | ICD-10-CM | POA: Diagnosis present

## 2016-02-05 DIAGNOSIS — E119 Type 2 diabetes mellitus without complications: Secondary | ICD-10-CM

## 2016-02-05 DIAGNOSIS — Z9981 Dependence on supplemental oxygen: Secondary | ICD-10-CM

## 2016-02-05 DIAGNOSIS — J441 Chronic obstructive pulmonary disease with (acute) exacerbation: Principal | ICD-10-CM | POA: Diagnosis present

## 2016-02-05 LAB — COMPREHENSIVE METABOLIC PANEL
ALBUMIN: 3.8 g/dL (ref 3.5–5.0)
ALK PHOS: 66 U/L (ref 38–126)
ALT: 13 U/L — AB (ref 14–54)
ANION GAP: 7 (ref 5–15)
AST: 21 U/L (ref 15–41)
BILIRUBIN TOTAL: 0.5 mg/dL (ref 0.3–1.2)
BUN: 17 mg/dL (ref 6–20)
CO2: 29 mmol/L (ref 22–32)
Calcium: 8.8 mg/dL — ABNORMAL LOW (ref 8.9–10.3)
Chloride: 102 mmol/L (ref 101–111)
Creatinine, Ser: 0.84 mg/dL (ref 0.44–1.00)
GFR calc non Af Amer: >60 mL/min (ref >60)
GLUCOSE: 97 mg/dL (ref 65–99)
POTASSIUM: 4.8 mmol/L (ref 3.5–5.1)
Sodium: 138 mmol/L (ref 135–145)
TOTAL PROTEIN: 8.4 g/dL — AB (ref 6.5–8.1)

## 2016-02-05 LAB — GLUCOSE, CAPILLARY: Glucose-Capillary: 141 mg/dL — ABNORMAL HIGH (ref 65–99)

## 2016-02-05 LAB — CBC
HEMATOCRIT: 41.8 % (ref 36.0–46.0)
HEMOGLOBIN: 12.8 g/dL (ref 12.0–15.0)
MCH: 27.5 pg (ref 26.0–34.0)
MCHC: 30.6 g/dL (ref 30.0–36.0)
MCV: 89.9 fL (ref 78.0–100.0)
Platelets: 168 10*3/uL (ref 150–400)
RBC: 4.65 MIL/uL (ref 3.87–5.11)
RDW: 14.6 % (ref 11.5–15.5)
WBC: 4.4 10*3/uL (ref 4.0–10.5)

## 2016-02-05 LAB — PROTIME-INR
INR: 1.08 (ref 0.00–1.49)
Prothrombin Time: 14.2 seconds (ref 11.6–15.2)

## 2016-02-05 LAB — BRAIN NATRIURETIC PEPTIDE: B NATRIURETIC PEPTIDE 5: 103 pg/mL — AB (ref 0.0–100.0)

## 2016-02-05 LAB — APTT: APTT: 30 s (ref 24–37)

## 2016-02-05 LAB — TROPONIN I: TROPONIN I: 0.03 ng/mL (ref <0.031)

## 2016-02-05 MED ORDER — ACETAMINOPHEN 650 MG RE SUPP: 650.0000 mg | Freq: Four times a day (QID) | RECTAL | Status: DC | PRN

## 2016-02-05 MED ORDER — POTASSIUM CHLORIDE CRYS ER 20 MEQ PO TBCR
20.0000 meq | EXTENDED_RELEASE_TABLET | Freq: Every day | ORAL | Status: DC
  Administered 2016-02-06 – 2016-02-07 (×2): 20 meq via ORAL
  Filled 2016-02-05 (×2): qty 1

## 2016-02-05 MED ORDER — IPRATROPIUM BROMIDE 0.02 % IN SOLN
0.5000 mg | RESPIRATORY_TRACT | Status: AC
  Administered 2016-02-05: 0.5 mg via RESPIRATORY_TRACT
  Filled 2016-02-05: qty 2.5

## 2016-02-05 MED ORDER — ALBUTEROL SULFATE (2.5 MG/3ML) 0.083% IN NEBU: 2.5000 mg | INHALATION_SOLUTION | RESPIRATORY_TRACT | Status: DC | PRN

## 2016-02-05 MED ORDER — IPRATROPIUM BROMIDE 0.02 % IN SOLN: 0.5000 mg | Freq: Four times a day (QID) | RESPIRATORY_TRACT | Status: DC

## 2016-02-05 MED ORDER — INSULIN ASPART 100 UNIT/ML ~~LOC~~ SOLN: 0.0000 [IU] | Freq: Every day | SUBCUTANEOUS | Status: DC

## 2016-02-05 MED ORDER — METHYLPREDNISOLONE SODIUM SUCC 125 MG IJ SOLR
125.0000 mg | Freq: Once | INTRAMUSCULAR | Status: AC
  Administered 2016-02-05: 125 mg via INTRAVENOUS
  Filled 2016-02-05: qty 2

## 2016-02-05 MED ORDER — CARVEDILOL 3.125 MG PO TABS
3.1250 mg | ORAL_TABLET | Freq: Two times a day (BID) | ORAL | Status: DC
  Administered 2016-02-05 – 2016-02-07 (×4): 3.125 mg via ORAL
  Filled 2016-02-05 (×4): qty 1

## 2016-02-05 MED ORDER — METFORMIN HCL 500 MG PO TABS
500.0000 mg | ORAL_TABLET | Freq: Every day | ORAL | Status: DC
  Administered 2016-02-06 – 2016-02-07 (×2): 500 mg via ORAL
  Filled 2016-02-05 (×2): qty 1

## 2016-02-05 MED ORDER — MAGNESIUM SULFATE 2 GM/50ML IV SOLN
2.0000 g | INTRAVENOUS | Status: AC
  Administered 2016-02-05: 2 g via INTRAVENOUS
  Filled 2016-02-05: qty 50

## 2016-02-05 MED ORDER — IPRATROPIUM-ALBUTEROL 0.5-2.5 (3) MG/3ML IN SOLN
3.0000 mL | Freq: Four times a day (QID) | RESPIRATORY_TRACT | Status: DC
  Administered 2016-02-05 – 2016-02-07 (×6): 3 mL via RESPIRATORY_TRACT
  Filled 2016-02-05 (×7): qty 3

## 2016-02-05 MED ORDER — ONDANSETRON HCL 4 MG PO TABS: 4.0000 mg | ORAL_TABLET | Freq: Four times a day (QID) | ORAL | Status: DC | PRN

## 2016-02-05 MED ORDER — ALBUTEROL SULFATE (2.5 MG/3ML) 0.083% IN NEBU: 2.5000 mg | INHALATION_SOLUTION | Freq: Four times a day (QID) | RESPIRATORY_TRACT | Status: DC

## 2016-02-05 MED ORDER — ALBUTEROL SULFATE (2.5 MG/3ML) 0.083% IN NEBU
5.0000 mg | INHALATION_SOLUTION | Freq: Once | RESPIRATORY_TRACT | Status: AC
Start: 2016-02-05 — End: 2016-02-05
  Administered 2016-02-05: 5 mg via RESPIRATORY_TRACT
  Filled 2016-02-05: qty 6

## 2016-02-05 MED ORDER — SODIUM CHLORIDE 0.9% FLUSH
3.0000 mL | Freq: Two times a day (BID) | INTRAVENOUS | Status: DC
  Administered 2016-02-05 – 2016-02-07 (×4): 3 mL via INTRAVENOUS

## 2016-02-05 MED ORDER — ASPIRIN 81 MG PO CHEW
324.0000 mg | CHEWABLE_TABLET | Freq: Once | ORAL | Status: AC
  Administered 2016-02-05: 324 mg via ORAL
  Filled 2016-02-05: qty 4

## 2016-02-05 MED ORDER — ACETAMINOPHEN 325 MG PO TABS: 650.0000 mg | ORAL_TABLET | Freq: Four times a day (QID) | ORAL | Status: DC | PRN

## 2016-02-05 MED ORDER — FUROSEMIDE 40 MG PO TABS
40.0000 mg | ORAL_TABLET | Freq: Every day | ORAL | Status: DC
  Administered 2016-02-06 – 2016-02-07 (×2): 40 mg via ORAL
  Filled 2016-02-05 (×2): qty 1

## 2016-02-05 MED ORDER — ENOXAPARIN SODIUM 40 MG/0.4ML ~~LOC~~ SOLN
40.0000 mg | SUBCUTANEOUS | Status: DC
  Administered 2016-02-05 – 2016-02-06 (×2): 40 mg via SUBCUTANEOUS
  Filled 2016-02-05 (×2): qty 0.4

## 2016-02-05 MED ORDER — ALBUTEROL (5 MG/ML) CONTINUOUS INHALATION SOLN
10.0000 mg/h | INHALATION_SOLUTION | RESPIRATORY_TRACT | Status: AC
  Administered 2016-02-05: 10 mg/h via RESPIRATORY_TRACT
  Filled 2016-02-05: qty 20

## 2016-02-05 MED ORDER — GUAIFENESIN ER 600 MG PO TB12
600.0000 mg | ORAL_TABLET | Freq: Two times a day (BID) | ORAL | Status: DC
  Administered 2016-02-05 – 2016-02-07 (×4): 600 mg via ORAL
  Filled 2016-02-05 (×4): qty 1

## 2016-02-05 MED ORDER — NITROGLYCERIN 0.4 MG SL SUBL: 0.4000 mg | SUBLINGUAL_TABLET | SUBLINGUAL | Status: DC | PRN

## 2016-02-05 MED ORDER — FLUTICASONE FUROATE-VILANTEROL 100-25 MCG/INH IN AEPB
1.0000 | INHALATION_SPRAY | Freq: Every day | RESPIRATORY_TRACT | Status: DC
  Administered 2016-02-06 – 2016-02-07 (×2): 1 via RESPIRATORY_TRACT
  Filled 2016-02-05: qty 28

## 2016-02-05 MED ORDER — METHYLPREDNISOLONE SODIUM SUCC 125 MG IJ SOLR
60.0000 mg | Freq: Two times a day (BID) | INTRAMUSCULAR | Status: DC
  Administered 2016-02-06 (×3): 60 mg via INTRAVENOUS
  Filled 2016-02-05 (×3): qty 2

## 2016-02-05 MED ORDER — ONDANSETRON HCL 4 MG/2ML IJ SOLN: 4.0000 mg | Freq: Four times a day (QID) | INTRAMUSCULAR | Status: DC | PRN

## 2016-02-05 MED ORDER — INSULIN ASPART 100 UNIT/ML ~~LOC~~ SOLN
0.0000 [IU] | Freq: Three times a day (TID) | SUBCUTANEOUS | Status: DC
  Administered 2016-02-06: 2 [IU] via SUBCUTANEOUS
  Administered 2016-02-06: 3 [IU] via SUBCUTANEOUS
  Administered 2016-02-07: 2 [IU] via SUBCUTANEOUS

## 2016-02-05 NOTE — ED Notes (Signed)
Report given to Will, RN. Pt transferred to floor.

## 2016-02-05 NOTE — ED Notes (Signed)
Pt reports sob that started today.  Reports was admitted last month for pneumonia.  Denies pain.

## 2016-02-05 NOTE — H&P (Signed)
History and Physical  Sonya Oliver UMP:[id number] DOB: [birth year] DOA: 02/05/2016  Referring physician: Dr Sabra Heck, ED physician PCP: Wardell Honour, MD  Outpatient Specialists:   Dr Karsten Ro (Urology)  Dr Geoffry Paradise (Ophthalmology  Dr Chalmers Cater (Endocrinology)  Chief Complaint: Shortness of breath  HPI: Sonya Oliver is a 75 y.o. female with a history of nocturnal desaturation, COPD, grade 1 diastolic heart failure with preserved EF on echocardiogram 06/21/2013, diabetes type 2, hypertension, hyperthyroidism not on medications. Patient seen in emergency department for shortness of breath that started this morning and gradually worsened throughout the day. The patient's shortness of breath worsened with eating food and ambulation and improved with rest.  Emergency department course included giving the patient nebulizer treatments, magnesium, and steroids, which helped the patient's shortness of breath. The patient is currently on 3 L is a cannula with saturations in the 90's.  The patient desaturates when talking, although appears comfortable. She also admits to a dry cough that also started today.   Review of Systems:   Pt denies any fevers, chills, nausea, vomiting, diarrhea, constipation, abdominal pain, shortness of breath, dyspnea on exertion, orthopnea, cough, wheezing, palpitations, headache, vision changes, lightheadedness, dizziness, melena, rectal bleeding.  Review of systems are otherwise negative  Past Medical History  Diagnosis Date  . Hypertension   . Bladder tumor   . Arthritis   . H/O pulmonary edema     jan 2014--  acute CHF w/ pulmonary edema  . Dyspnea on exertion   . Nocturnal oxygen desaturation     uses O2  HS via [place]--  and PRN daytime  . Nocturia   . Type 2 diabetes mellitus ([place])   . Ureteral tumor   . CHF (congestive heart failure) ([place])     NO CARDIOLOGIST---  MONITORED BY PCP DR Jacelyn Grip  . Hyperthyroidism     currently no meds -- labs stable  . COPD with  emphysema ([place])     last Acute Exacerbation 08-21-2014   Past Surgical History  Procedure Laterality Date  . Cystoscopy  03/23/2012    Procedure: CYSTOSCOPY;  Surgeon: Claybon Jabs, MD;  Location: [facility];  Service: Urology;  Laterality: N/A;  . Transurethral resection of bladder tumor  03/23/2012    Procedure: TRANSURETHRAL RESECTION OF BLADDER TUMOR (TURBT);  Surgeon: Claybon Jabs, MD;  Location: [facility];  Service: Urology;  Laterality: N/A;  . Transthoracic echocardiogram  06-21-2013    moderate LVH,  grade I diastolic dysfunction, ef 40-08%,  mild MR & TR  . Transurethral resection of bladder tumor N/A 01/08/2013    Procedure: TRANSURETHRAL RESECTION OF BLADDER TUMOR (TURBT);  Surgeon: Claybon Jabs, MD;  Location: [facility];  Service: Urology;  Laterality: N/A;  . Cataract extraction w/phaco Left 01/31/2014    Procedure: CATARACT EXTRACTION PHACO AND INTRAOCULAR LENS PLACEMENT (IOC);  Surgeon: Tonny Branch, MD;  Location: [facility];  Service: Ophthalmology;  Laterality: Left;  CDE: 23.78  . Cataract extraction w/phaco Right 02/28/2014    Procedure: CATARACT EXTRACTION PHACO AND INTRAOCULAR LENS PLACEMENT (IOC);  Surgeon: Tonny Branch, MD;  Location: [facility];  Service: Ophthalmology;  Laterality: Right;  CDE:  11.34  . Abdominal hysterectomy  1980'S    W/ BILATERAL SALPINGO-OOPHORECTOMY  . Cystoscopy with retrograde pyelogram, ureteroscopy and stent placement Right 10/21/2014    Procedure: CYSTOSCOPY WITH RETROGRADE PYELOGRAM, URETEROSCOPY, BLADDER TUMOR BIOPSY;  Surgeon: Claybon Jabs, MD;  Location: [facility];  Service: Urology;  Laterality: Right;  Social History:  reports that she quit smoking about 9 years ago. Her smoking use included Cigarettes. She has a 50 pack-year smoking history. She has never used smokeless tobacco. She reports that she does not drink alcohol or use illicit drugs. Patient lives at home  No Known Allergies  Family History  Problem  Relation Age of Onset  . Diabetes Mother   . Diabetes Father       Prior to Admission medications   Medication Sig Start Date End Date Taking? Authorizing Provider  carvedilol (COREG) 3.125 MG tablet TAKE 1 TABLET BY MOUTH TWICE DAILY 01/30/16  Yes Wardell Honour, MD  Cholecalciferol (VITAMIN D3) 2000 UNITS TABS Take 1 tablet by mouth daily.   Yes Historical Provider, MD  fluticasone furoate-vilanterol (BREO ELLIPTA) 100-25 MCG/INH AEPB Inhale 1 puff into the lungs 1 day or 1 dose. Patient taking differently: Inhale 1 puff into the lungs daily.  12/18/15  Yes Kathie Dike, MD  furosemide (LASIX) 40 MG tablet TAKE 1 TABLET BY MOUTH ONCE DAILY 01/30/16  Yes Wardell Honour, MD  KLOR-CON M20 20 MEQ tablet TAKE 1 TABLET (20 MEQ TOTAL) BY MOUTH DAILY. 12/05/15  Yes Wardell Honour, MD  metFORMIN (GLUCOPHAGE) 500 MG tablet Take 1 tablet (500 mg total) by mouth daily. 01/30/16  Yes Wardell Honour, MD  RESTASIS MULTIDOSE 0.05 % ophthalmic emulsion Place 1 drop into both eyes 2 (two) times daily. 01/19/16  Yes Historical Provider, MD  albuterol (PROVENTIL HFA;VENTOLIN HFA) 108 (90 Base) MCG/ACT inhaler Inhale 1-2 puffs into the lungs every 6 (six) hours as needed for wheezing or shortness of breath. 12/18/15   Kathie Dike, MD  guaiFENesin (MUCINEX) 600 MG 12 hr tablet Take 1 tablet (600 mg total) by mouth 2 (two) times daily. Patient taking differently: Take 600 mg by mouth 2 (two) times daily as needed for cough or to loosen phlegm.  12/18/15   Kathie Dike, MD    Physical Exam: BP 132/66 mmHg  Pulse 80  Temp(Src) 98.7 F (37.1 C) (Oral)  Resp 18  SpO2 92%  General: Older Black female. Awake and alert and oriented x3. No acute cardiopulmonary distress.  HEENT: Normocephalic atraumatic.  Right and left ears normal in appearance.  Pupils equal, round, reactive to light. Extraocular muscles are intact. Sclerae anicteric and noninjected.  Moist mucosal membranes. No mucosal lesions.  Neck: Neck  supple without lymphadenopathy. No carotid bruits. No masses palpated.  Cardiovascular: Regular rate with normal S1-S2 sounds. No murmurs, rubs, gallops auscultated. JVD slightly increased to 6 cm. 2+ pitting edema in lower extremities bilaterally.  Respiratory: Prolonged exhalation, expiratory wheezing.  Slight rales in bases. No accessory muscle use. Abdomen: Soft, nontender, nondistended. Active bowel sounds. No masses or hepatosplenomegaly  Skin: No rashes, lesions, or ulcerations.  Dry, warm to touch. 2+ dorsalis pedis and radial pulses. Musculoskeletal: No calf or leg pain. All major joints not erythematous nontender.  No upper or lower joint deformation.  Good ROM.  No contractures  Psychiatric: Intact judgment and insight. Pleasant and cooperative. Neurologic: No focal neurological deficits. Strength is 5/5 and symmetric in upper and lower extremities.  Cranial nerves II through XII are grossly intact.           Labs on Admission: I have personally reviewed following labs and imaging studies  CBC:  Recent Labs Lab 02/05/16 1455  WBC 4.4  HGB 12.8  HCT 41.8  MCV 89.9  PLT 542   Basic Metabolic Panel:  Recent Labs  Lab 02/05/16 1455  NA 138  K 4.8  CL 102  CO2 29  GLUCOSE 97  BUN 17  CREATININE 0.84  CALCIUM 8.8*   GFR: CrCl cannot be calculated (Unknown ideal weight.). Liver Function Tests:  Recent Labs Lab 02/05/16 1455  AST 21  ALT 13*  ALKPHOS 66  BILITOT 0.5  PROT 8.4*  ALBUMIN 3.8   No results for input(s): LIPASE, AMYLASE in the last 168 hours. No results for input(s): AMMONIA in the last 168 hours. Coagulation Profile:  Recent Labs Lab 02/05/16 1455  INR 1.08   Cardiac Enzymes:  Recent Labs Lab 02/05/16 1455  TROPONINI 0.03   BNP (last 3 results) No results for input(s): PROBNP in the last 8760 hours. HbA1C: No results for input(s): HGBA1C in the last 72 hours. CBG: No results for input(s): GLUCAP in the last 168 hours. Lipid  Profile: No results for input(s): CHOL, HDL, LDLCALC, TRIG, CHOLHDL, LDLDIRECT in the last 72 hours. Thyroid Function Tests: No results for input(s): TSH, T4TOTAL, FREET4, T3FREE, THYROIDAB in the last 72 hours. Anemia Panel: No results for input(s): VITAMINB12, FOLATE, FERRITIN, TIBC, IRON, RETICCTPCT in the last 72 hours. Urine analysis:    Component Value Date/Time   COLORURINE RED* 03/21/2012 1936   APPEARANCEUR CLOUDY* 03/21/2012 1936   LABSPEC 1.018 03/21/2012 1936   PHURINE 6.0 03/21/2012 1936   GLUCOSEU NEGATIVE 03/21/2012 1936   HGBUR LARGE* 03/21/2012 1936   BILIRUBINUR MODERATE* 03/21/2012 1936   KETONESUR 15* 03/21/2012 1936   PROTEINUR >300* 03/21/2012 1936   UROBILINOGEN 1.0 03/21/2012 1936   NITRITE NEGATIVE 03/21/2012 1936   LEUKOCYTESUR LARGE* 03/21/2012 1936   Sepsis Labs: '@LABRCNTIP'$ (procalcitonin:4,lacticidven:4) )No results found for this or any previous visit (from the past 240 hour(s)).   Radiological Exams on Admission: Dg Chest 2 View  02/05/2016  CLINICAL DATA:  Chest pain for 1 day with shortness of Breath EXAM: CHEST  2 VIEW COMPARISON:  12/14/2015 FINDINGS: Cardiac shadow is within normal limits. The lungs are hyperinflated consistent with COPD. Bibasilar infiltrative changes are again seen similar to that noted on the prior exam. Given its chronicity some scarring may be present as well. No new focal infiltrate is seen. No sizable effusion is noted. IMPRESSION: COPD with bibasilar changes similar to that noted in April 2017. Electronically Signed   By: Inez Catalina M.D.   On: 02/05/2016 16:20    EKG: Independently reviewed. Sinus rhythm. Baseline wander. Prolonged PR interval at 0.235.  No acute ST elevation or depression.  Assessment/Plan: Active Problems:   Edema leg   Diabetes mellitus, type 2 (HCC)   Acute respiratory failure with hypoxia (HCC)   COPD exacerbation ([place])    This patient was discussed with the ED physician, including pertinent  vitals, physical exam findings, labs, and imaging.  We also discussed care given by the ED provider.  #1 acute on chronic respiratory failure with hypoxia  Observation  Oxygen therapy to maintain sats #2 COPD exacerbation  Steroids: Solu-Medrol 60 mg IV twice a day  Nebulizer treatments: DuoNeb every 6 with albuterol every 2 when necessary  Guaifenesin #3 diabetes  CBGs before meals and daily at bedtime  Continue metformin  Sliding scale insulin #4 edema  Lasix tonight and daily  Metabolic panel in the morning  Potassium placement  DVT prophylaxis: Lovenox Consultants: None Code Status: DO NOT RESUSCITATE Family Communication: Daughters in the room  Disposition Plan: Observation   Truett Mainland, DO [facility] Pager [phone]  If 7PM-7AM, please  contact night-coverage [email or web address] Password [email or web address]

## 2016-02-05 NOTE — ED Provider Notes (Signed)
CSN: [id number]     Arrival date & time 02/05/16  1442 History   First MD Initiated Contact with Patient 02/05/16 1446     Chief Complaint  Patient presents with  . Shortness of Breath     (Consider location/radiation/quality/duration/timing/severity/associated sxs/prior Treatment) HPI  The patient is a 75 year old female, she has multiple medical problems including congestive heart failure, diabetes, COPD. She was admitted 3 months ago with a lobar pneumonia in respiratory distress. The patient does use home oxygen as needed, mostly at night. During the day she does not use it. She states that over the last 24 hours she has had increasing shortness of breath. She did not take her medications this morning, she has been on a [address] trip with her daughter. The patient is having some difficulty speaking because of shortness of breath and speaks in very shortened and truncated sentences. The symptoms are persistent and severe. She denies fevers but endorses swelling of the legs  Past Medical History  Diagnosis Date  . Hypertension   . Bladder tumor   . Arthritis   . H/O pulmonary edema     jan 2014--  acute CHF w/ pulmonary edema  . Dyspnea on exertion   . Nocturnal oxygen desaturation     uses O2  [facility] via [place]--  and PRN daytime  . Nocturia   . Type 2 diabetes mellitus ([place])   . Ureteral tumor   . CHF (congestive heart failure) ([place])     NO CARDIOLOGIST---  MONITORED BY PCP DR Jacelyn Grip  . Hyperthyroidism     currently no meds -- labs stable  . COPD with emphysema ([place])     last Acute Exacerbation 08-21-2014   Past Surgical History  Procedure Laterality Date  . Cystoscopy  03/23/2012    Procedure: CYSTOSCOPY;  Surgeon: Claybon Jabs, MD;  Location: [facility];  Service: Urology;  Laterality: N/A;  . Transurethral resection of bladder tumor  03/23/2012    Procedure: TRANSURETHRAL RESECTION OF BLADDER TUMOR (TURBT);  Surgeon: Claybon Jabs, MD;  Location: [facility];  Service: Urology;   Laterality: N/A;  . Transthoracic echocardiogram  06-21-2013    moderate LVH,  grade I diastolic dysfunction, ef 78-29%,  mild MR & TR  . Transurethral resection of bladder tumor N/A 01/08/2013    Procedure: TRANSURETHRAL RESECTION OF BLADDER TUMOR (TURBT);  Surgeon: Claybon Jabs, MD;  Location: [facility];  Service: Urology;  Laterality: N/A;  . Cataract extraction w/phaco Left 01/31/2014    Procedure: CATARACT EXTRACTION PHACO AND INTRAOCULAR LENS PLACEMENT (IOC);  Surgeon: Tonny Branch, MD;  Location: [facility];  Service: Ophthalmology;  Laterality: Left;  CDE: 23.78  . Cataract extraction w/phaco Right 02/28/2014    Procedure: CATARACT EXTRACTION PHACO AND INTRAOCULAR LENS PLACEMENT (IOC);  Surgeon: Tonny Branch, MD;  Location: [facility];  Service: Ophthalmology;  Laterality: Right;  CDE:  11.34  . Abdominal hysterectomy  1980'S    W/ BILATERAL SALPINGO-OOPHORECTOMY  . Cystoscopy with retrograde pyelogram, ureteroscopy and stent placement Right 10/21/2014    Procedure: CYSTOSCOPY WITH RETROGRADE PYELOGRAM, URETEROSCOPY, BLADDER TUMOR BIOPSY;  Surgeon: Claybon Jabs, MD;  Location: [facility];  Service: Urology;  Laterality: Right;   Family History  Problem Relation Age of Onset  . Diabetes Mother   . Diabetes Father    Social History  Substance Use Topics  . Smoking status: Former Smoker -- 1.00 packs/day for 50 years    Types: Cigarettes  Quit date: 09/24/2006  . Smokeless tobacco: Never Used  . Alcohol Use: No   OB History    No data available     Review of Systems  All other systems reviewed and are negative.     Allergies  Review of patient's allergies indicates no known allergies.  Home Medications   Prior to Admission medications   Medication Sig Start Date End Date Taking? Authorizing Provider  carvedilol (COREG) 3.125 MG tablet TAKE 1 TABLET BY MOUTH TWICE DAILY 01/30/16  Yes Wardell Honour, MD  Cholecalciferol (VITAMIN D3) 2000 UNITS TABS  Take 1 tablet by mouth daily.   Yes Historical Provider, MD  fluticasone furoate-vilanterol (BREO ELLIPTA) 100-25 MCG/INH AEPB Inhale 1 puff into the lungs 1 day or 1 dose. Patient taking differently: Inhale 1 puff into the lungs daily.  12/18/15  Yes Kathie Dike, MD  furosemide (LASIX) 40 MG tablet TAKE 1 TABLET BY MOUTH ONCE DAILY 01/30/16  Yes Wardell Honour, MD  KLOR-CON M20 20 MEQ tablet TAKE 1 TABLET (20 MEQ TOTAL) BY MOUTH DAILY. 12/05/15  Yes Wardell Honour, MD  metFORMIN (GLUCOPHAGE) 500 MG tablet Take 1 tablet (500 mg total) by mouth daily. 01/30/16  Yes Wardell Honour, MD  RESTASIS MULTIDOSE 0.05 % ophthalmic emulsion Place 1 drop into both eyes 2 (two) times daily. 01/19/16  Yes Historical Provider, MD  albuterol (PROVENTIL HFA;VENTOLIN HFA) 108 (90 Base) MCG/ACT inhaler Inhale 1-2 puffs into the lungs every 6 (six) hours as needed for wheezing or shortness of breath. 12/18/15   Kathie Dike, MD  guaiFENesin (MUCINEX) 600 MG 12 hr tablet Take 1 tablet (600 mg total) by mouth 2 (two) times daily. Patient taking differently: Take 600 mg by mouth 2 (two) times daily as needed for cough or to loosen phlegm.  12/18/15   Kathie Dike, MD   BP 150/63 mmHg  Pulse 69  Temp(Src) 98.7 F (37.1 C) (Oral)  Resp 20  SpO2 100% Physical Exam  Constitutional: She appears well-developed and well-nourished. She appears distressed.  HENT:  Head: Normocephalic and atraumatic.  Mouth/Throat: Oropharynx is clear and moist. No oropharyngeal exudate.  Eyes: Conjunctivae and EOM are normal. Pupils are equal, round, and reactive to light. Right eye exhibits no discharge. Left eye exhibits no discharge. No scleral icterus.  Neck: Normal range of motion. Neck supple. No JVD present. No thyromegaly present.  Cardiovascular: Normal rate, regular rhythm, normal heart sounds and intact distal pulses.  Exam reveals no gallop and no friction rub.   No murmur heard. JVD present  Pulmonary/Chest: She is in  respiratory distress. She has wheezes. She has no rales.  Abdominal: Soft. Bowel sounds are normal. She exhibits no distension and no mass. There is no tenderness.  Musculoskeletal: Normal range of motion. She exhibits edema. She exhibits no tenderness.  Lymphadenopathy:    She has no cervical adenopathy.  Neurological: She is alert. Coordination normal.  Skin: Skin is warm and dry. No rash noted. No erythema.  Psychiatric: She has a normal mood and affect. Her behavior is normal.  Nursing note and vitals reviewed.   ED Course  Procedures (including critical care time) Labs Review Labs Reviewed  COMPREHENSIVE METABOLIC PANEL - Abnormal; Notable for the following:    Calcium 8.8 (*)    Total Protein 8.4 (*)    ALT 13 (*)    All other components within normal limits  BRAIN NATRIURETIC PEPTIDE - Abnormal; Notable for the following:    B Natriuretic Peptide  103.0 (*)    All other components within normal limits  APTT  CBC  PROTIME-INR  TROPONIN I    Imaging Review Dg Chest 2 View  02/05/2016  CLINICAL DATA:  Chest pain for 1 day with shortness of Breath EXAM: CHEST  2 VIEW COMPARISON:  12/14/2015 FINDINGS: Cardiac shadow is within normal limits. The lungs are hyperinflated consistent with COPD. Bibasilar infiltrative changes are again seen similar to that noted on the prior exam. Given its chronicity some scarring may be present as well. No new focal infiltrate is seen. No sizable effusion is noted. IMPRESSION: COPD with bibasilar changes similar to that noted in April 2017. Electronically Signed   By: Inez Catalina M.D.   On: 02/05/2016 16:20   I have personally reviewed and evaluated these images and lab results as part of my medical decision-making.   EKG Interpretation   Date/Time:  Thursday February 05 2016 14:53:05 EDT Ventricular Rate:  75 PR Interval:  235 QRS Duration: 105 QT Interval:  302 QTC Calculation: 337 R Axis:   113 Text Interpretation:  Sinus rhythm Ventricular  tachycardia, unsustained  Prolonged PR interval Probable lateral infarct, age indeterminate Baseline  wander in lead(s) III aVF V2 V6 Since last tracing rate slower Baseline  wander Confirmed by Malkia Nippert  MD, [place] ([id number]) on 02/05/2016 3:15:34 PM      MDM   Final diagnoses:  Respiratory distress    The patient is in respiratory distress with wheezing in all lung fields, JVD and some peripheral edema. This could either be congestive heart failure or COPD, x-ray ordered, labs, neb, reevaluate  Xray neg for pna or failure Pt has ongoing resp distress despite neb Steroids given Continuous neg - reevalated and has ongoing severe wheezing Hypoxic to 70's when ambulates No fever no leukocytosis BNP 103 - less likely CHF  D/w Dr. Nehemiah Settle who will admit  Meds given in ED:  Medications  nitroGLYCERIN (NITROSTAT) SL tablet 0.4 mg (not administered)  albuterol (PROVENTIL,VENTOLIN) solution continuous neb (10 mg/hr Nebulization New Bag/Given 02/05/16 1723)  aspirin chewable tablet 324 mg (324 mg Oral Given 02/05/16 1459)  albuterol (PROVENTIL) (2.5 MG/3ML) 0.083% nebulizer solution 5 mg (5 mg Nebulization Given 02/05/16 1513)  ipratropium (ATROVENT) nebulizer solution 0.5 mg (0.5 mg Nebulization Given 02/05/16 1513)  methylPREDNISolone sodium succinate (SOLU-MEDROL) 125 mg/2 mL injection 125 mg (125 mg Intravenous Given 02/05/16 1636)  magnesium sulfate IVPB 2 g 50 mL (0 g Intravenous Stopped 02/05/16 1820)    Mag given due to ongoing distress  CRITICAL CARE Performed by: Johnna Acosta Total critical care time: 35 minutes Critical care time was exclusive of separately billable procedures and treating other patients. Critical care was necessary to treat or prevent imminent or life-threatening deterioration. Critical care was time spent personally by me on the following activities: development of treatment plan with patient and/or surrogate as well as nursing, discussions with consultants, evaluation  of patient's response to treatment, examination of patient, obtaining history from patient or surrogate, ordering and performing treatments and interventions, ordering and review of laboratory studies, ordering and review of radiographic studies, pulse oximetry and re-evaluation of patient's condition.    Noemi Chapel, MD 02/05/16 [phone]

## 2016-02-06 DIAGNOSIS — I110 Hypertensive heart disease with heart failure: Secondary | ICD-10-CM | POA: Diagnosis present

## 2016-02-06 DIAGNOSIS — R0600 Dyspnea, unspecified: Secondary | ICD-10-CM | POA: Diagnosis present

## 2016-02-06 DIAGNOSIS — E119 Type 2 diabetes mellitus without complications: Secondary | ICD-10-CM | POA: Diagnosis present

## 2016-02-06 DIAGNOSIS — J9601 Acute respiratory failure with hypoxia: Secondary | ICD-10-CM | POA: Diagnosis not present

## 2016-02-06 DIAGNOSIS — Z833 Family history of diabetes mellitus: Secondary | ICD-10-CM | POA: Diagnosis not present

## 2016-02-06 DIAGNOSIS — R600 Localized edema: Secondary | ICD-10-CM

## 2016-02-06 DIAGNOSIS — J441 Chronic obstructive pulmonary disease with (acute) exacerbation: Principal | ICD-10-CM

## 2016-02-06 DIAGNOSIS — Z9981 Dependence on supplemental oxygen: Secondary | ICD-10-CM | POA: Diagnosis not present

## 2016-02-06 DIAGNOSIS — I5033 Acute on chronic diastolic (congestive) heart failure: Secondary | ICD-10-CM | POA: Diagnosis present

## 2016-02-06 DIAGNOSIS — Z7951 Long term (current) use of inhaled steroids: Secondary | ICD-10-CM | POA: Diagnosis not present

## 2016-02-06 DIAGNOSIS — Z7984 Long term (current) use of oral hypoglycemic drugs: Secondary | ICD-10-CM | POA: Diagnosis not present

## 2016-02-06 DIAGNOSIS — E0590 Thyrotoxicosis, unspecified without thyrotoxic crisis or storm: Secondary | ICD-10-CM | POA: Diagnosis present

## 2016-02-06 DIAGNOSIS — Z66 Do not resuscitate: Secondary | ICD-10-CM | POA: Diagnosis present

## 2016-02-06 DIAGNOSIS — J9621 Acute and chronic respiratory failure with hypoxia: Secondary | ICD-10-CM | POA: Diagnosis present

## 2016-02-06 DIAGNOSIS — Z87891 Personal history of nicotine dependence: Secondary | ICD-10-CM | POA: Diagnosis not present

## 2016-02-06 LAB — BASIC METABOLIC PANEL
ANION GAP: 5 (ref 5–15)
BUN: 20 mg/dL (ref 6–20)
CO2: 30 mmol/L (ref 22–32)
Calcium: 8.8 mg/dL — ABNORMAL LOW (ref 8.9–10.3)
Chloride: 102 mmol/L (ref 101–111)
Creatinine, Ser: 0.67 mg/dL (ref 0.44–1.00)
GFR calc Af Amer: >60 mL/min (ref >60)
GFR calc non Af Amer: >60 mL/min (ref >60)
GLUCOSE: 154 mg/dL — AB (ref 65–99)
POTASSIUM: 4.1 mmol/L (ref 3.5–5.1)
Sodium: 137 mmol/L (ref 135–145)

## 2016-02-06 LAB — GLUCOSE, CAPILLARY
GLUCOSE-CAPILLARY: 119 mg/dL — AB (ref 65–99)
Glucose-Capillary: 144 mg/dL — ABNORMAL HIGH (ref 65–99)
Glucose-Capillary: 161 mg/dL — ABNORMAL HIGH (ref 65–99)
Glucose-Capillary: 186 mg/dL — ABNORMAL HIGH (ref 65–99)

## 2016-02-06 MED ORDER — LEVOFLOXACIN 500 MG PO TABS
500.0000 mg | ORAL_TABLET | Freq: Every day | ORAL | Status: DC
  Administered 2016-02-06 – 2016-02-07 (×2): 500 mg via ORAL
  Filled 2016-02-06 (×2): qty 1

## 2016-02-06 NOTE — Care Management Note (Signed)
Case Management Note  Patient Details  Name: RAFEEF LAU MRN: [id number] Date of Birth: [birth year]  Subjective/Objective:                  Pt admitted with COPD exacerbation. Pt is from home, lives with her son and DIL. Pt has no HH services, reports her DIL assists with anything she needs. Pt has cane, walker and BSC; does not have a wheelchair. Pt has home O2 through [facility] and at baseline is on 2lpm. Pt does not have a neb machine. Pt plans to return home with self care tomorrow.   Action/Plan: No CM needs anticipated.   Expected Discharge Date:    02/07/2016              Expected Discharge Plan:  Home/Self Care  In-House Referral:  NA  Discharge planning Services  CM Consult  Post Acute Care Choice:  NA Choice offered to:  NA  DME Arranged:    DME Agency:     HH Arranged:    HH Agency:     Status of Service:  Completed, signed off  Medicare Important Message Given:    Date Medicare IM Given:    Medicare IM give by:    Date Additional Medicare IM Given:    Additional Medicare Important Message give by:     If discussed at [place] of Stay Meetings, dates discussed:    Additional Comments:  Sherald Barge, RN 02/06/2016, 3:21 PM

## 2016-02-06 NOTE — Care Management Obs Status (Signed)
[place] NOTIFICATION   Patient Details  Name: Sonya Oliver MRN: [id number] Date of Birth: August 17, 1941   Medicare Observation Status Notification Given:  Yes    Sherald Barge, RN 02/06/2016, 3:21 PM

## 2016-02-06 NOTE — Progress Notes (Signed)
[facility] PROGRESS NOTE  Sonya Oliver DXA:[id number] DOB: 1940/12/14    PCP:   Wardell Honour, MD   HPI: Sonya Oliver is an 75 y.o. female with hx of HTN, COPD, DM, hx of diastolic CHF,  thyroid disease, admitted for COPD exacerbation.  She is feeling a little better, but is still SOB.  Her CXR showed no infiltrate.  She was given IV Steroids, nebs, and some lasix.    Rewiew of Systems:  Constitutional: Negative for malaise, fever and chills. No significant weight loss or weight gain Eyes: Negative for eye pain, redness and discharge, diplopia, visual changes, or flashes of light. ENMT: Negative for ear pain, hoarseness, nasal congestion, sinus pressure and sore throat. No headaches; tinnitus, drooling, or problem swallowing. Cardiovascular: Negative for chest pain, palpitations, diaphoresis, dyspnea and peripheral edema. ; No orthopnea, PND Respiratory: Negative for cough, hemoptysis, wheezing and stridor. No pleuritic chestpain. Gastrointestinal: Negative for nausea, vomiting, diarrhea, constipation, abdominal pain, melena, blood in stool, hematemesis, jaundice and rectal bleeding.    Genitourinary: Negative for frequency, dysuria, incontinence,flank pain and hematuria; Musculoskeletal: Negative for back pain and neck pain. Negative for swelling and trauma.;  Skin: . Negative for pruritus, rash, abrasions, bruising and skin lesion.; ulcerations Neuro: Negative for headache, lightheadedness and neck stiffness. Negative for weakness, altered level of consciousness , altered mental status, extremity weakness, burning feet, involuntary movement, seizure and syncope.  Psych: negative for anxiety, depression, insomnia, tearfulness, panic attacks, hallucinations, paranoia, suicidal or homicidal ideation    Past Medical History  Diagnosis Date  . Hypertension   . Bladder tumor   . Arthritis   . H/O pulmonary edema     jan 2014--  acute CHF w/ pulmonary edema  . Dyspnea on  exertion   . Nocturnal oxygen desaturation     uses O2  HS via [place]--  and PRN daytime  . Nocturia   . Type 2 diabetes mellitus ([place])   . Ureteral tumor   . CHF (congestive heart failure) ([place])     NO CARDIOLOGIST---  MONITORED BY PCP DR Jacelyn Grip  . Hyperthyroidism     currently no meds -- labs stable  . COPD with emphysema ([place])     last Acute Exacerbation 08-21-2014    Past Surgical History  Procedure Laterality Date  . Cystoscopy  03/23/2012    Procedure: CYSTOSCOPY;  Surgeon: Claybon Jabs, MD;  [facility];  Service: Urology;  Laterality: N/A;  . Transurethral resection of bladder tumor  03/23/2012    Procedure: TRANSURETHRAL RESECTION OF BLADDER TUMOR (TURBT);  Surgeon: Claybon Jabs, MD;  [facility];  Service: Urology;  Laterality: N/A;  . Transthoracic echocardiogram  06-21-2013    moderate LVH,  grade I diastolic dysfunction, ef 20-94%,  mild MR & TR  . Transurethral resection of bladder tumor N/A 01/08/2013    Procedure: TRANSURETHRAL RESECTION OF BLADDER TUMOR (TURBT);  Surgeon: Claybon Jabs, MD;  [facility];  Service: Urology;  Laterality: N/A;  . Cataract extraction w/phaco Left 01/31/2014    Procedure: CATARACT EXTRACTION PHACO AND INTRAOCULAR LENS PLACEMENT (IOC);  Surgeon: Tonny Branch, MD;  [facility];  Service: Ophthalmology;  Laterality: Left;  CDE: 23.78  . Cataract extraction w/phaco Right 02/28/2014    Procedure: CATARACT EXTRACTION PHACO AND INTRAOCULAR LENS PLACEMENT (IOC);  Surgeon: Tonny Branch, MD;  Location: [facility];  Service: Ophthalmology;  Laterality: Right;  CDE:  11.34  . Abdominal hysterectomy  1980'S  W/ BILATERAL SALPINGO-OOPHORECTOMY  . Cystoscopy with retrograde pyelogram, ureteroscopy and stent placement Right 10/21/2014    Procedure: CYSTOSCOPY WITH RETROGRADE PYELOGRAM, URETEROSCOPY, BLADDER TUMOR BIOPSY;  Surgeon: Claybon Jabs, MD;  Location: [facility];  Service: Urology;  Laterality: Right;     Medications:  HOME MEDS: Prior to Admission medications   Medication Sig Start Date End Date Taking? Authorizing Provider  carvedilol (COREG) 3.125 MG tablet TAKE 1 TABLET BY MOUTH TWICE DAILY 01/30/16  Yes Wardell Honour, MD  Cholecalciferol (VITAMIN D3) 2000 UNITS TABS Take 1 tablet by mouth daily.   Yes Historical Provider, MD  fluticasone furoate-vilanterol (BREO ELLIPTA) 100-25 MCG/INH AEPB Inhale 1 puff into the lungs 1 day or 1 dose. Patient taking differently: Inhale 1 puff into the lungs daily.  12/18/15  Yes Kathie Dike, MD  furosemide (LASIX) 40 MG tablet TAKE 1 TABLET BY MOUTH ONCE DAILY 01/30/16  Yes Wardell Honour, MD  KLOR-CON M20 20 MEQ tablet TAKE 1 TABLET (20 MEQ TOTAL) BY MOUTH DAILY. 12/05/15  Yes Wardell Honour, MD  metFORMIN (GLUCOPHAGE) 500 MG tablet Take 1 tablet (500 mg total) by mouth daily. 01/30/16  Yes Wardell Honour, MD  RESTASIS MULTIDOSE 0.05 % ophthalmic emulsion Place 1 drop into both eyes 2 (two) times daily. 01/19/16  Yes Historical Provider, MD  albuterol (PROVENTIL HFA;VENTOLIN HFA) 108 (90 Base) MCG/ACT inhaler Inhale 1-2 puffs into the lungs every 6 (six) hours as needed for wheezing or shortness of breath. 12/18/15   Kathie Dike, MD  guaiFENesin (MUCINEX) 600 MG 12 hr tablet Take 1 tablet (600 mg total) by mouth 2 (two) times daily. Patient taking differently: Take 600 mg by mouth 2 (two) times daily as needed for cough or to loosen phlegm.  12/18/15   Kathie Dike, MD     Allergies:  No Known Allergies  Social History:   reports that she quit smoking about 9 years ago. Her smoking use included Cigarettes. She has a 50 pack-year smoking history. She has never used smokeless tobacco. She reports that she does not drink alcohol or use illicit drugs.  Family History: Family History  Problem Relation Age of Onset  . Diabetes Mother   . Diabetes Father      Physical Exam: Filed Vitals:   02/06/16 0128 02/06/16 0500 02/06/16 0750  02/06/16 1000  BP:  139/52    Pulse:  60    Temp:  98.2 F (36.8 C)    TempSrc:  Oral    Resp:  20    Height: 5' 6.5" (1.689 m)     Weight: 82 kg (180 lb 12.4 oz)     SpO2: 96% 100% 97% 96%   Blood pressure 139/52, pulse 60, temperature 98.2 F (36.8 C), temperature source Oral, resp. rate 20, height 5' 6.5" (1.689 m), weight 82 kg (180 lb 12.4 oz), SpO2 96 %.  GEN:  Pleasant  patient lying in the stretcher in no acute distress; cooperative with exam. PSYCH:  alert and oriented x4; does not appear anxious or depressed; affect is appropriate. HEENT: Mucous membranes pink and anicteric; PERRLA; EOM intact; no cervical lymphadenopathy nor thyromegaly or carotid bruit; no JVD; There were no stridor. Neck is very supple. Breasts:: Not examined CHEST WALL: No tenderness CHEST: Normal respiration, there is some wheezing bilaterally. She has some dyspnea and tachypnea.  HEART: Regular rate and rhythm.  There are no murmur, rub, or gallops.   BACK: No kyphosis or scoliosis; no CVA tenderness  ABDOMEN: soft and non-tender; no masses, no organomegaly, normal abdominal bowel sounds; no pannus; no intertriginous candida. There is no rebound and no distention. Rectal Exam: Not done EXTREMITIES: No bone or joint deformity; age-appropriate arthropathy of the hands and knees; no edema; no ulcerations.  There is no calf tenderness. Genitalia: not examined PULSES: 2+ and symmetric SKIN: Normal hydration no rash or ulceration CNS: Cranial nerves 2-12 grossly intact no focal lateralizing neurologic deficit.  Speech is fluent; uvula elevated with phonation, facial symmetry and tongue midline. DTR are normal bilaterally, cerebella exam is intact, barbinski is negative and strengths are equaled bilaterally.  No sensory loss.   Labs on Admission:  Basic Metabolic Panel:  Recent Labs Lab 02/05/16 1455 02/06/16 0510  NA 138 137  K 4.8 4.1  CL 102 102  CO2 29 30  GLUCOSE 97 154*  BUN 17 20  CREATININE  0.84 0.67  CALCIUM 8.8* 8.8*   Liver Function Tests:  Recent Labs Lab 02/05/16 1455  AST 21  ALT 13*  ALKPHOS 66  BILITOT 0.5  PROT 8.4*  ALBUMIN 3.8   CBC:  Recent Labs Lab 02/05/16 1455  WBC 4.4  HGB 12.8  HCT 41.8  MCV 89.9  PLT 168   Cardiac Enzymes:  Recent Labs Lab 02/05/16 1455  TROPONINI 0.03    CBG:  Recent Labs Lab 02/05/16 2121 02/06/16 0736  GLUCAP 141* 144*     Radiological Exams on Admission: Dg Chest 2 View  02/05/2016  CLINICAL DATA:  Chest pain for 1 day with shortness of Breath EXAM: CHEST  2 VIEW COMPARISON:  12/14/2015 FINDINGS: Cardiac shadow is within normal limits. The lungs are hyperinflated consistent with COPD. Bibasilar infiltrative changes are again seen similar to that noted on the prior exam. Given its chronicity some scarring may be present as well. No new focal infiltrate is seen. No sizable effusion is noted. IMPRESSION: COPD with bibasilar changes similar to that noted in April 2017. Electronically Signed   By: Inez Catalina M.D.   On: 02/05/2016 16:20    EKG: Independently reviewed.   Assessment/Plan Present on Admission:  . COPD exacerbation ([place]) . Acute respiratory failure with hypoxia ([place]) . Edema leg  PLAN:  #1 acute on chronic respiratory failure with hypoxia  Observation  Oxygen therapy to maintain sats #2 COPD exacerbation  Steroids: Solu-Medrol 60 mg IV twice a day  Nebulizer treatments: DuoNeb every 6 with albuterol every 2 when necessary  Guaifenesin  Will add oral Levoquin.  #3 diabetes  CBGs before meals and daily at bedtime  Continue metformin  Sliding scale insulin   #4 edema  Lasix PRN.  Metabolic panel in the morning  Potassium placement  Other plans as per orders. Code Status: DNR.    Orvan Falconer, MD.  FACP [facility] Pager [phone] 7pm to 7am.  02/06/2016, 10:20 AM

## 2016-02-07 DIAGNOSIS — J9601 Acute respiratory failure with hypoxia: Secondary | ICD-10-CM

## 2016-02-07 LAB — GLUCOSE, CAPILLARY
GLUCOSE-CAPILLARY: 128 mg/dL — AB (ref 65–99)
Glucose-Capillary: 144 mg/dL — ABNORMAL HIGH (ref 65–99)

## 2016-02-07 MED ORDER — PREDNISONE 20 MG PO TABS: 40.0000 mg | ORAL_TABLET | Freq: Every day | ORAL | Status: DC

## 2016-02-07 MED ORDER — LEVOFLOXACIN 500 MG PO TABS: 500.0000 mg | ORAL_TABLET | Freq: Every day | ORAL | Status: DC

## 2016-02-07 NOTE — Progress Notes (Signed)
Patient alert and oriented, independent, VSS, pt. Tolerating diet well. No complaints of pain or nausea. Pt. Had IV removed tip intact. Pt. Had prescriptions given. Pt. Voiced understanding of discharge instructions with no further questions. Pt. Discharged via wheelchair with auxilliary.

## 2016-02-07 NOTE — Discharge Summary (Signed)
Physician Discharge Summary  Sonya Oliver DPO:[id number] DOB: [birth year] DOA: 02/05/2016  PCP: Sonya Honour, MD  Admit date: 02/05/2016 Discharge date: 02/07/2016  Time spent: 35 minutes  Recommendations for Outpatient Follow-up:  1. Follow up with PCP next week.    Discharge Diagnoses:  Active Problems:   Edema leg   Diabetes mellitus, type 2 (HCC)   Acute respiratory failure with hypoxia (HCC)   COPD exacerbation (HCC)   Discharge Condition: Improved.  No wheezing and ambulate without DOE.   Diet recommendation: Carb modified, NaCL 2 grams.   Filed Weights   02/06/16 0128  Weight: 82 kg (180 lb 12.4 oz)    History of present illness:  Patient was admitted by Dr Sonya Oliver on February 05, 2016 for COPD exacerbation.  As per his H and P:  " Sonya Oliver is a 75 y.o. female with a history of nocturnal desaturation, COPD, grade 1 diastolic heart failure with preserved EF on echocardiogram 06/21/2013, diabetes type 2, hypertension, hyperthyroidism not on medications. Patient seen in emergency department for shortness of breath that started this morning and gradually worsened throughout the day. The patient's shortness of breath worsened with eating food and ambulation and improved with rest. Emergency department course included giving the patient nebulizer treatments, magnesium, and steroids, which helped the patient's shortness of breath. The patient is currently on 3 L is a cannula with saturations in the 90's. The patient desaturates when talking, although appears comfortable. She also admits to a dry cough that also started today.   Hospital Course:  Patient was admitted into the hospital and she was given IV steroid, along with neb Tx.  The next day, oral Levoquin '500mg'$  per day was added.  Her home meds were continued including her Lasix, for acute on chronic diastolic CHF.  She improved slowly and was kept in the hospital, and subsequently, she improved and was able to increase  ambulation without SOB.  She has home oxygen, and is now anxious to go home.  She will be discharged to home with her home oxygen at 3L [place], along with 3 more days of Prednisone at '40mg'$  per day, and Levoquin '500mg'$  per day for another 5 days.  She will follow up with her PCP next week, and will defer to PCP as to whether or not pulmonary consultation will be necessary.  Thank you for allowing me to participate in her care.  Good Day.    Consultations:  None.   Discharge Exam: Filed Vitals:   02/06/16 2022 02/07/16 0632  BP: 137/53 145/56  Pulse: 62 56  Temp: 98.4 F (36.9 C) 98.4 F (36.9 C)  Resp: 20 20    Discharge Instructions   Discharge Instructions    Diet - low sodium heart healthy    Complete by:  As directed      Discharge instructions    Complete by:  As directed   Avoid second hand smoke.  Take your medicine as directed.  Take Prednisone with food.     Increase activity slowly    Complete by:  As directed           Current Discharge Medication List    START taking these medications   Details  levofloxacin (LEVAQUIN) 500 MG tablet Take 1 tablet (500 mg total) by mouth daily. Qty: 5 tablet, Refills: 0    predniSONE (DELTASONE) 20 MG tablet Take 2 tablets (40 mg total) by mouth daily before breakfast. Qty: 6 tablet, Refills: 0  CONTINUE these medications which have NOT CHANGED   Details  carvedilol (COREG) 3.125 MG tablet TAKE 1 TABLET BY MOUTH TWICE DAILY Qty: 180 tablet, Refills: 0    Cholecalciferol (VITAMIN D3) 2000 UNITS TABS Take 1 tablet by mouth daily.    fluticasone furoate-vilanterol (BREO ELLIPTA) 100-25 MCG/INH AEPB Inhale 1 puff into the lungs 1 day or 1 dose. Qty: 1 each, Refills: 0    furosemide (LASIX) 40 MG tablet TAKE 1 TABLET BY MOUTH ONCE DAILY Qty: 90 tablet, Refills: 0    KLOR-CON M20 20 MEQ tablet TAKE 1 TABLET (20 MEQ TOTAL) BY MOUTH DAILY. Qty: 90 tablet, Refills: 1    metFORMIN (GLUCOPHAGE) 500 MG tablet Take 1 tablet (500  mg total) by mouth daily. Qty: 90 tablet, Refills: 0    RESTASIS MULTIDOSE 0.05 % ophthalmic emulsion Place 1 drop into both eyes 2 (two) times daily.    albuterol (PROVENTIL HFA;VENTOLIN HFA) 108 (90 Base) MCG/ACT inhaler Inhale 1-2 puffs into the lungs every 6 (six) hours as needed for wheezing or shortness of breath. Qty: 1 Inhaler, Refills: 1    guaiFENesin (MUCINEX) 600 MG 12 hr tablet Take 1 tablet (600 mg total) by mouth 2 (two) times daily. Qty: 20 tablet, Refills: 0       No Known Allergies    The results of significant diagnostics from this hospitalization (including imaging, microbiology, ancillary and laboratory) are listed below for reference.    Significant Diagnostic Studies: Dg Chest 2 View  02/05/2016  CLINICAL DATA:  Chest pain for 1 day with shortness of Breath EXAM: CHEST  2 VIEW COMPARISON:  12/14/2015 FINDINGS: Cardiac shadow is within normal limits. The lungs are hyperinflated consistent with COPD. Bibasilar infiltrative changes are again seen similar to that noted on the prior exam. Given its chronicity some scarring may be present as well. No new focal infiltrate is seen. No sizable effusion is noted. IMPRESSION: COPD with bibasilar changes similar to that noted in April 2017. Electronically Signed   By: Sonya Oliver M.D.   On: 02/05/2016 16:20   Dg Chest 2 View  01/09/2016  CLINICAL DATA:  Followup pneumonia. EXAM: CHEST  2 VIEW COMPARISON:  None. FINDINGS: Lungs are hyperexpanded with flattening of the hemidiaphragms. Subtle hazy bibasilar opacification which may be due to atelectasis or infection. No definite effusion. Mild cardiomegaly. Calcified plaque over the aortic arch. Mild degenerative change of the spine. IMPRESSION: Mild bibasilar opacification which may be due to atelectasis or infection. COPD. Mild cardiomegaly. Electronically Signed   By: Sonya Oliver M.D.   On: 01/09/2016 15:44    Microbiology: No results found for this or any previous visit (from  the past 240 hour(s)).   Labs: Basic Metabolic Panel:  Recent Labs Lab 02/05/16 1455 02/06/16 0510  NA 138 137  K 4.8 4.1  CL 102 102  CO2 29 30  GLUCOSE 97 154*  BUN 17 20  CREATININE 0.84 0.67  CALCIUM 8.8* 8.8*   Liver Function Tests:  Recent Labs Lab 02/05/16 1455  AST 21  ALT 13*  ALKPHOS 66  BILITOT 0.5  PROT 8.4*  ALBUMIN 3.8   CBC:  Recent Labs Lab 02/05/16 1455  WBC 4.4  HGB 12.8  HCT 41.8  MCV 89.9  PLT 168   Cardiac Enzymes:  Recent Labs Lab 02/05/16 1455  TROPONINI 0.03   BNP: BNP (last 3 results)  Recent Labs  12/14/15 1232 02/05/16 1455  BNP 84.0 103.0*   CBG:  Recent Labs Lab  02/06/16 1121 02/06/16 1629 02/06/16 2050 02/07/16 0739 02/07/16 1153  GLUCAP 161* 119* 186* 144* 128*    Signed:  Labresha Mellor MD. Rosalita Chessman.  [facility] 02/07/2016, 1:08 PM

## 2016-02-09 ENCOUNTER — Ambulatory Visit (INDEPENDENT_AMBULATORY_CARE_PROVIDER_SITE_OTHER): Payer: Medicare Other | Admitting: Family Medicine

## 2016-02-09 ENCOUNTER — Encounter: Payer: Self-pay | Admitting: Family Medicine

## 2016-02-09 VITALS — BP 134/68 | HR 65 | Temp 97.4°F | Ht 66.5 in | Wt 178.0 lb

## 2016-02-09 DIAGNOSIS — R0602 Shortness of breath: Secondary | ICD-10-CM

## 2016-02-09 DIAGNOSIS — J449 Chronic obstructive pulmonary disease, unspecified: Secondary | ICD-10-CM | POA: Diagnosis not present

## 2016-02-09 DIAGNOSIS — J9601 Acute respiratory failure with hypoxia: Secondary | ICD-10-CM | POA: Diagnosis not present

## 2016-02-09 NOTE — Progress Notes (Signed)
Subjective:    Patient ID: Sonya Oliver, female    DOB: [birth year], 75 y.o.   MRN: [id number]  HPI Patient here today for 4 week follow up from OV for SOB and she was recently in [facility] for respiratory distress.   Discharge Summary Notes    Discharge Summaries by Orvan Falconer, MD at 02/07/2016 1:06 PM  Version 1 of 1   Author: Orvan Falconer, MD Service: Internal Medicine Author Type: Physician   Filed: 02/07/2016 1:15 PM Note Time: 02/07/2016 1:06 PM Status: Signed   Editor: Orvan Falconer, MD (Physician)     Expand All Collapse All   Physician Discharge Summary  LASHARON DUNIVAN ASN:[id number] DOB: 06-04-1941 DOA: 02/05/2016  PCP: Wardell Honour, MD  Admit date: 02/05/2016 Discharge date: 02/07/2016  Time spent: 35 minutes  Recommendations for Outpatient Follow-up:  1. Follow up with PCP next week.   Discharge Diagnoses:  Active Problems:  Edema leg  Diabetes mellitus, type 2 (HCC)  Acute respiratory failure with hypoxia (HCC)  COPD exacerbation (HCC)   Discharge Condition: Improved. No wheezing and ambulate without DOE.   Diet recommendation: Carb modified, NaCL 2 grams.   Filed Weights   02/06/16 0128  Weight: 82 kg (180 lb 12.4 oz)    History of present illness: Patient was admitted by Dr Nehemiah Settle on February 05, 2016 for COPD exacerbation. As per his H and P: " Sonya Oliver is a 75 y.o. female with a history of nocturnal desaturation, COPD, grade 1 diastolic heart failure with preserved EF on echocardiogram 06/21/2013, diabetes type 2, hypertension, hyperthyroidism not on medications. Patient seen in emergency department for shortness of breath that started this morning and gradually worsened throughout the day. The patient's shortness of breath worsened with eating food and ambulation and improved with rest. Emergency department course included giving the patient nebulizer treatments, magnesium, and steroids, which helped the patient's  shortness of breath. The patient is currently on 3 L is a cannula with saturations in the 90's. The patient desaturates when talking, although appears comfortable. She also admits to a dry cough that also started today.   Hospital Course: Patient was admitted into the hospital and she was given IV steroid, along with neb Tx. The next day, oral Levoquin '500mg'$  per day was added. Her home meds were continued including her Lasix, for acute on chronic diastolic CHF. She improved slowly and was kept in the hospital, and subsequently, she improved and was able to increase ambulation without SOB. She has home oxygen, and is now anxious to go home. She will be discharged to home with her home oxygen at 3L [place], along with 3 more days of Prednisone at '40mg'$  per day, and Levoquin '500mg'$  per day for another 5 days. She will follow up with her PCP next week, and will defer to PCP as to whether or not pulmonary consultation will be necessary. Thank you for allowing me to participate in her care. Good Day.    Consultations:  None.  Discharge Exam: Filed Vitals:   02/06/16 2022 02/07/16 0632  BP: 137/53 145/56  Pulse: 62 56  Temp: 98.4 F (36.9 C) 98.4 F (36.9 C)  Resp: 20 20    Discharge Instructions   Discharge Instructions    Diet - low sodium heart healthy  Complete by: As directed      Discharge instructions  Complete by: As directed   Avoid second hand smoke. Take your medicine as directed. Take Prednisone with  food.     Increase activity slowly  Complete by: As directed           Current Discharge Medication List    START taking these medications   Details  levofloxacin (LEVAQUIN) 500 MG tablet Take 1 tablet (500 mg total) by mouth daily. Qty: 5 tablet, Refills: 0    predniSONE (DELTASONE) 20 MG tablet Take 2 tablets (40 mg total) by mouth daily before breakfast. Qty: 6 tablet, Refills: 0      CONTINUE these  medications which have NOT CHANGED   Details  carvedilol (COREG) 3.125 MG tablet TAKE 1 TABLET BY MOUTH TWICE DAILY Qty: 180 tablet, Refills: 0    Cholecalciferol (VITAMIN D3) 2000 UNITS TABS Take 1 tablet by mouth daily.    fluticasone furoate-vilanterol (BREO ELLIPTA) 100-25 MCG/INH AEPB Inhale 1 puff into the lungs 1 day or 1 dose. Qty: 1 each, Refills: 0    furosemide (LASIX) 40 MG tablet TAKE 1 TABLET BY MOUTH ONCE DAILY Qty: 90 tablet, Refills: 0    KLOR-CON M20 20 MEQ tablet TAKE 1 TABLET (20 MEQ TOTAL) BY MOUTH DAILY. Qty: 90 tablet, Refills: 1    metFORMIN (GLUCOPHAGE) 500 MG tablet Take 1 tablet (500 mg total) by mouth daily. Qty: 90 tablet, Refills: 0    RESTASIS MULTIDOSE 0.05 % ophthalmic emulsion Place 1 drop into both eyes 2 (two) times daily.    albuterol (PROVENTIL HFA;VENTOLIN HFA) 108 (90 Base) MCG/ACT inhaler Inhale 1-2 puffs into the lungs every 6 (six) hours as needed for wheezing or shortness of breath. Qty: 1 Inhaler, Refills: 1    guaiFENesin (MUCINEX) 600 MG 12 hr tablet Take 1 tablet (600 mg total) by mouth 2 (two) times daily. Qty: 20 tablet, Refills: 0       No Known Allergies     The results of significant diagnostics from this hospitalization (including imaging, microbiology, ancillary and laboratory) are listed below for reference.    Significant Diagnostic Studies:  Imaging Results    Dg Chest 2 View  02/05/2016 CLINICAL DATA: Chest pain for 1 day with shortness of Breath EXAM: CHEST 2 VIEW COMPARISON: 12/14/2015 FINDINGS: Cardiac shadow is within normal limits. The lungs are hyperinflated consistent with COPD. Bibasilar infiltrative changes are again seen similar to that noted on the prior exam. Given its chronicity some scarring may be present as well. No new focal infiltrate is seen. No sizable effusion is noted. IMPRESSION: COPD with bibasilar changes similar to that noted in April 2017. Electronically  Signed By: Inez Catalina M.D. On: 02/05/2016 16:20   Dg Chest 2 View  01/09/2016 CLINICAL DATA: Followup pneumonia. EXAM: CHEST 2 VIEW COMPARISON: None. FINDINGS: Lungs are hyperexpanded with flattening of the hemidiaphragms. Subtle hazy bibasilar opacification which may be due to atelectasis or infection. No definite effusion. Mild cardiomegaly. Calcified plaque over the aortic arch. Mild degenerative change of the spine. IMPRESSION: Mild bibasilar opacification which may be due to atelectasis or infection. COPD. Mild cardiomegaly. Electronically Signed By: Marin Olp M.D. On: 01/09/2016 15:44     Microbiology: No results found for this or any previous visit (from the past 240 hour(s)).   Labs: Basic Metabolic Panel:  Last Labs      Recent Labs Lab 02/05/16 1455 02/06/16 0510  NA 138 137  K 4.8 4.1  CL 102 102  CO2 29 30  GLUCOSE 97 154*  BUN 17 20  CREATININE 0.84 0.67  CALCIUM 8.8* 8.8*     Liver Function Tests:  Last  Labs      Recent Labs Lab 02/05/16 1455  AST 21  ALT 13*  ALKPHOS 66  BILITOT 0.5  PROT 8.4*  ALBUMIN 3.8     CBC:  Last Labs      Recent Labs Lab 02/05/16 1455  WBC 4.4  HGB 12.8  HCT 41.8  MCV 89.9  PLT 168     Cardiac Enzymes:  Last Labs      Recent Labs Lab 02/05/16 1455  TROPONINI 0.03     BNP: BNP (last 3 results)  Recent Labs (within last 365 days)     Recent Labs  12/14/15 1232 02/05/16 1455  BNP 84.0 103.0*     CBG:  Last Labs      Recent Labs Lab 02/06/16 1121 02/06/16 1629 02/06/16 2050 02/07/16 0739 02/07/16 1153  GLUCAP 161* 119* 186* 144* 128*      Signed:  LE,PETER MD. Rosalita Chessman.  [facility] 02/07/2016, 1:08 PM                   Patient Active Problem List   Diagnosis Date Noted  . COPD exacerbation ([place]) 02/05/2016  . Hypoxia 12/14/2015  . Diabetes mellitus, type 2 ([place]) 12/14/2015   . Lobar pneumonia due to unspecified organism 12/14/2015  . Acute respiratory failure with hypoxia ([place]) 12/14/2015  . Acute respiratory failure ([place]) 12/14/2015  . Hypokalemia 08/22/2013  . Acute on chronic diastolic heart failure ([place]) 06/20/2013  . Acute-on-chronic respiratory failure ([place]) 06/20/2013  . CHF (congestive heart failure) ([place]) 12/19/2012  . COPD (chronic obstructive pulmonary disease) ([place]) 12/19/2012  . Unspecified vitamin D deficiency 12/19/2012  . HTN (hypertension) 12/19/2012  . SOB (shortness of breath) 09/12/2012  . Edema leg 09/12/2012  . Diabetes ([place]) 09/12/2012  . Anemia 03/21/2012  . Syncope 03/21/2012  . Hematuria, gross 02/23/2012  . Anemia associated with acute blood loss 02/23/2012  . Orthostatic hypotension 02/23/2012  . Bladder mass 02/23/2012  . Hyperthyroidism 02/23/2012   Outpatient Encounter Prescriptions as of 02/09/2016  Medication Sig  . albuterol (PROVENTIL HFA;VENTOLIN HFA) 108 (90 Base) MCG/ACT inhaler Inhale 1-2 puffs into the lungs every 6 (six) hours as needed for wheezing or shortness of breath.  . carvedilol (COREG) 3.125 MG tablet TAKE 1 TABLET BY MOUTH TWICE DAILY  . Cholecalciferol (VITAMIN D3) 2000 UNITS TABS Take 1 tablet by mouth daily.  . fluticasone furoate-vilanterol (BREO ELLIPTA) 100-25 MCG/INH AEPB Inhale 1 puff into the lungs 1 day or 1 dose. (Patient taking differently: Inhale 1 puff into the lungs daily. )  . furosemide (LASIX) 40 MG tablet TAKE 1 TABLET BY MOUTH ONCE DAILY  . guaiFENesin (MUCINEX) 600 MG 12 hr tablet Take 1 tablet (600 mg total) by mouth 2 (two) times daily. (Patient taking differently: Take 600 mg by mouth 2 (two) times daily as needed for cough or to loosen phlegm. )  . KLOR-CON M20 20 MEQ tablet TAKE 1 TABLET (20 MEQ TOTAL) BY MOUTH DAILY.  [name] levofloxacin (LEVAQUIN) 500 MG tablet Take 1 tablet (500 mg total) by mouth daily.  . metFORMIN (GLUCOPHAGE) 500 MG tablet Take 1 tablet (500 mg total) by mouth  daily.  . predniSONE (DELTASONE) 20 MG tablet Take 2 tablets (40 mg total) by mouth daily before breakfast.  . RESTASIS MULTIDOSE 0.05 % ophthalmic emulsion Place 1 drop into both eyes 2 (two) times daily.   No facility-administered encounter medications on file as of 02/09/2016.      Review of Systems  Constitutional: Negative.  HENT: Negative.   Eyes: Negative.   Respiratory: Positive for shortness of breath (worse with excertion).   Cardiovascular: Negative.   Gastrointestinal: Negative.   Endocrine: Negative.   Genitourinary: Negative.   Musculoskeletal: Negative.   Skin: Negative.   Allergic/Immunologic: Negative.   Neurological: Negative.   Hematological: Negative.   Psychiatric/Behavioral: Negative.        Objective:   Physical Exam  Constitutional: She is oriented to person, place, and time. She appears well-developed and well-nourished.  Cardiovascular: Normal rate, regular rhythm and normal heart sounds.   Pulmonary/Chest: Effort normal and breath sounds normal.  Neurological: She is alert and oriented to person, place, and time.  Psychiatric: She has a normal mood and affect. Her behavior is normal.   BP 134/68 mmHg  Pulse 65  Temp(Src) 97.4 F (36.3 C) (Oral)  Ht 5' 6.5" (1.689 m)  Wt 178 lb (80.74 kg)  BMI 28.30 kg/m2  SpO2 93%        Assessment & Plan:  1. SOB (shortness of breath) Patient was hospitalized for 2 days last week with acute exacerbation of COPD. She currently is finishing up prednisone and Levaquin. She feels much better. She continues on home oxygen at night and when necessary a time - Ambulatory referral to Pulmonology  2. Acute respiratory failure with hypoxia ([place]) See above - Ambulatory referral to Pulmonology  3. Chronic obstructive pulmonary disease, unspecified COPD, unspecified chronic bronchitis type See above - Ambulatory referral to Pulmonology  Wardell Honour MD

## 2016-02-27 ENCOUNTER — Other Ambulatory Visit: Payer: Self-pay | Admitting: *Deleted

## 2016-02-27 MED ORDER — FLUTICASONE FUROATE-VILANTEROL 100-25 MCG/INH IN AEPB: 1.0000 | INHALATION_SPRAY | Freq: Every day | RESPIRATORY_TRACT | Status: DC

## 2016-03-10 ENCOUNTER — Encounter: Payer: Self-pay | Admitting: Family Medicine

## 2016-03-10 ENCOUNTER — Ambulatory Visit (INDEPENDENT_AMBULATORY_CARE_PROVIDER_SITE_OTHER): Payer: Medicare Other | Admitting: Family Medicine

## 2016-03-10 VITALS — BP 148/71 | HR 73 | Temp 97.3°F | Ht 66.5 in | Wt 177.4 lb

## 2016-03-10 DIAGNOSIS — I10 Essential (primary) hypertension: Secondary | ICD-10-CM

## 2016-03-10 DIAGNOSIS — Z794 Long term (current) use of insulin: Secondary | ICD-10-CM

## 2016-03-10 DIAGNOSIS — I5033 Acute on chronic diastolic (congestive) heart failure: Secondary | ICD-10-CM | POA: Diagnosis not present

## 2016-03-10 DIAGNOSIS — J441 Chronic obstructive pulmonary disease with (acute) exacerbation: Secondary | ICD-10-CM | POA: Diagnosis not present

## 2016-03-10 DIAGNOSIS — E118 Type 2 diabetes mellitus with unspecified complications: Secondary | ICD-10-CM

## 2016-03-10 LAB — BAYER DCA HB A1C WAIVED: HB A1C: 6 % (ref <7.0)

## 2016-03-10 NOTE — Progress Notes (Signed)
Subjective:    Patient ID: Sonya Oliver, female    DOB: Sep 26, 1940, 75 y.o.   MRN: [id number]  HPI Patient is here today for a follow up on her diabetes and hypertension. Patient is still experiencing SOB and states that she is still using the albuterol as needed and Breo daily. Patient also states that she is using Breo BID on some days.    Review of Systems  Constitutional: Negative.   HENT: Negative.   Eyes: Negative.   Respiratory: Positive for shortness of breath and wheezing.   Cardiovascular: Negative.   Gastrointestinal: Negative.   Endocrine: Negative.   Genitourinary: Negative.   Musculoskeletal: Negative.   Skin: Negative.   Allergic/Immunologic: Negative.   Neurological: Negative.   Hematological: Negative.   Psychiatric/Behavioral: Negative.      Depression screen [facility] 2/9 03/10/2016 02/09/2016 01/09/2016 08/07/2015 03/07/2015  Decreased Interest 0 0 0 0 3  Down, Depressed, Hopeless 0 0 0 0 2  PHQ - 2 Score 0 0 0 0 5  Altered sleeping - - - - 1  Tired, decreased energy - - - - 1  Change in appetite - - - - 1  Feeling bad or failure about yourself  - - - - 2  Trouble concentrating - - - - 2  Moving slowly or fidgety/restless - - - - 2  Suicidal thoughts - - - - 2  PHQ-9 Score - - - - 16        Patient Active Problem List   Diagnosis Date Noted  . COPD exacerbation ([place]) 02/05/2016  . Hypoxia 12/14/2015  . Diabetes mellitus, type 2 ([place]) 12/14/2015  . Lobar pneumonia due to unspecified organism 12/14/2015  . Acute respiratory failure with hypoxia ([place]) 12/14/2015  . Acute respiratory failure ([place]) 12/14/2015  . Hypokalemia 08/22/2013  . Acute on chronic diastolic heart failure ([place]) 06/20/2013  . Acute-on-chronic respiratory failure ([place]) 06/20/2013  . CHF (congestive heart failure) ([place]) 12/19/2012  . COPD (chronic obstructive pulmonary disease) ([place]) 12/19/2012  . Unspecified vitamin D deficiency 12/19/2012  . HTN (hypertension) 12/19/2012  .  SOB (shortness of breath) 09/12/2012  . Edema leg 09/12/2012  . Diabetes ([place]) 09/12/2012  . Anemia 03/21/2012  . Syncope 03/21/2012  . Hematuria, gross 02/23/2012  . Anemia associated with acute blood loss 02/23/2012  . Orthostatic hypotension 02/23/2012  . Bladder mass 02/23/2012  . Hyperthyroidism 02/23/2012   Outpatient Encounter Prescriptions as of 03/10/2016  Medication Sig  . albuterol (PROVENTIL HFA;VENTOLIN HFA) 108 (90 Base) MCG/ACT inhaler Inhale 1-2 puffs into the lungs every 6 (six) hours as needed for wheezing or shortness of breath.  . carvedilol (COREG) 3.125 MG tablet TAKE 1 TABLET BY MOUTH TWICE DAILY  . Cholecalciferol (VITAMIN D3) 2000 UNITS TABS Take 1 tablet by mouth daily.  . fluticasone furoate-vilanterol (BREO ELLIPTA) 100-25 MCG/INH AEPB Inhale 1 puff into the lungs daily.  . furosemide (LASIX) 40 MG tablet TAKE 1 TABLET BY MOUTH ONCE DAILY  . guaiFENesin (MUCINEX) 600 MG 12 hr tablet Take 1 tablet (600 mg total) by mouth 2 (two) times daily.  [name] KLOR-CON M20 20 MEQ tablet TAKE 1 TABLET (20 MEQ TOTAL) BY MOUTH DAILY.  . metFORMIN (GLUCOPHAGE) 500 MG tablet Take 1 tablet (500 mg total) by mouth daily.  . RESTASIS MULTIDOSE 0.05 % ophthalmic emulsion Place 1 drop into both eyes 2 (two) times daily.  . [DISCONTINUED] albuterol (PROVENTIL HFA;VENTOLIN HFA) 108 (90 BASE) MCG/ACT inhaler Inhale 1-2 puffs into the lungs every  6 (six) hours as needed for wheezing or shortness of breath.  . [DISCONTINUED] carvedilol (COREG) 3.125 MG tablet TAKE 1 TABLET BY MOUTH TWICE DAILY  . [DISCONTINUED] Fluticasone Furoate-Vilanterol 100-25 MCG/INH AEPB Inhale 1 Inhaler into the lungs 1 day or 1 dose. (Patient not taking: Reported on 12/14/2015)  . [DISCONTINUED] furosemide (LASIX) 40 MG tablet TAKE 1 TABLET BY MOUTH ONCE DAILY  . [DISCONTINUED] KLOR-CON M20 20 MEQ tablet TAKE 1 TABLET (20 MEQ TOTAL) BY MOUTH DAILY.  . [DISCONTINUED] levofloxacin (LEVAQUIN) 500 MG tablet Take 1 tablet  (500 mg total) by mouth daily.  . [DISCONTINUED] metFORMIN (GLUCOPHAGE) 500 MG tablet Take 1 tablet (500 mg total) by mouth daily.  . [DISCONTINUED] predniSONE (DELTASONE) 20 MG tablet Take 2 tablets (40 mg total) by mouth daily before breakfast.   No facility-administered encounter medications on file as of 03/10/2016.       Objective:   Physical Exam  Constitutional: She is oriented to person, place, and time. She appears well-developed and well-nourished.  Cardiovascular: Normal rate, regular rhythm and normal heart sounds.   Pulmonary/Chest: Effort normal and breath sounds normal.  Musculoskeletal: She exhibits no edema.  Neurological: She is alert and oriented to person, place, and time.  Psychiatric: She has a normal mood and affect.   BP 148/71 mmHg  Pulse 73  Temp(Src) 97.3 F (36.3 C) (Oral)  Ht 5' 6.5" (1.689 m)  Wt 177 lb 6.4 oz (80.468 kg)  BMI 28.21 kg/m2  SpO2 93%        Assessment & Plan:   Problem List Items Addressed This Visit      Cardiovascular and Mediastinum   HTN (hypertension)   Acute on chronic diastolic heart failure (HCC)     Respiratory   COPD (chronic obstructive pulmonary disease) ([place])     Endocrine   Diabetes ([place]) - Primary   Relevant Orders   Bayer DCA Hb A1c Waived (Completed)

## 2016-03-18 LAB — HM DIABETES EYE EXAM

## 2016-04-07 ENCOUNTER — Encounter: Payer: Self-pay | Admitting: Family Medicine

## 2016-04-07 ENCOUNTER — Ambulatory Visit (INDEPENDENT_AMBULATORY_CARE_PROVIDER_SITE_OTHER): Payer: Medicare Other | Admitting: Family Medicine

## 2016-04-07 VITALS — BP 138/61 | HR 64 | Temp 97.0°F | Ht 66.5 in | Wt 179.4 lb

## 2016-04-07 DIAGNOSIS — J441 Chronic obstructive pulmonary disease with (acute) exacerbation: Secondary | ICD-10-CM | POA: Diagnosis not present

## 2016-04-07 DIAGNOSIS — I10 Essential (primary) hypertension: Secondary | ICD-10-CM

## 2016-04-07 DIAGNOSIS — I5033 Acute on chronic diastolic (congestive) heart failure: Secondary | ICD-10-CM

## 2016-04-07 MED ORDER — ALBUTEROL SULFATE HFA 108 (90 BASE) MCG/ACT IN AERS: 1.0000 | INHALATION_SPRAY | Freq: Four times a day (QID) | RESPIRATORY_TRACT | 1 refills | Status: DC | PRN

## 2016-04-07 NOTE — Progress Notes (Signed)
Subjective:    Patient ID: Sonya Oliver, female    DOB: 09-20-1940, 75 y.o.   MRN: [id number]  HPI 75 year old female with COPD diabetes and heart failure. In the month since she was last here she has seen pulmonary doctor Hopkins in [place] who started her on another inhaler called Incruz. She has been doing pretty well at home she monitors her oxygen with a pulse oximeter. She does not weigh herself daily. There is no increase in cough or shortness of breath.  Patient Active Problem List   Diagnosis Date Noted  . COPD exacerbation ([place]) 02/05/2016  . Hypoxia 12/14/2015  . Diabetes mellitus, type 2 ([place]) 12/14/2015  . Lobar pneumonia due to unspecified organism 12/14/2015  . Acute respiratory failure with hypoxia ([place]) 12/14/2015  . Acute respiratory failure ([place]) 12/14/2015  . Hypokalemia 08/22/2013  . Acute on chronic diastolic heart failure ([place]) 06/20/2013  . Acute-on-chronic respiratory failure ([place]) 06/20/2013  . CHF (congestive heart failure) ([place]) 12/19/2012  . COPD (chronic obstructive pulmonary disease) ([place]) 12/19/2012  . Unspecified vitamin D deficiency 12/19/2012  . HTN (hypertension) 12/19/2012  . SOB (shortness of breath) 09/12/2012  . Edema leg 09/12/2012  . Diabetes ([place]) 09/12/2012  . Anemia 03/21/2012  . Syncope 03/21/2012  . Hematuria, gross 02/23/2012  . Anemia associated with acute blood loss 02/23/2012  . Orthostatic hypotension 02/23/2012  . Bladder mass 02/23/2012  . Hyperthyroidism 02/23/2012   Outpatient Encounter Prescriptions as of 04/07/2016  Medication Sig  . albuterol (PROVENTIL HFA;VENTOLIN HFA) 108 (90 Base) MCG/ACT inhaler Inhale 1-2 puffs into the lungs every 6 (six) hours as needed for wheezing or shortness of breath.  . carvedilol (COREG) 3.125 MG tablet TAKE 1 TABLET BY MOUTH TWICE DAILY  . Cholecalciferol (VITAMIN D3) 2000 UNITS TABS Take 1 tablet by mouth daily.  . fluticasone furoate-vilanterol (BREO ELLIPTA) 100-25 MCG/INH AEPB  Inhale 1 puff into the lungs daily.  . furosemide (LASIX) 40 MG tablet TAKE 1 TABLET BY MOUTH ONCE DAILY  . guaiFENesin (MUCINEX) 600 MG 12 hr tablet Take 1 tablet (600 mg total) by mouth 2 (two) times daily.  [name] KLOR-CON M20 20 MEQ tablet TAKE 1 TABLET (20 MEQ TOTAL) BY MOUTH DAILY.  . metFORMIN (GLUCOPHAGE) 500 MG tablet Take 1 tablet (500 mg total) by mouth daily.  . RESTASIS MULTIDOSE 0.05 % ophthalmic emulsion Place 1 drop into both eyes 2 (two) times daily.   No facility-administered encounter medications on file as of 04/07/2016.       Review of Systems  Constitutional: Negative.   Respiratory: Positive for shortness of breath.   Cardiovascular: Negative.   Genitourinary: Negative.   Neurological: Negative.   Psychiatric/Behavioral: Negative.        Objective:   Physical Exam  Constitutional: She is oriented to person, place, and time. She appears well-developed and well-nourished.  Cardiovascular: Normal rate and regular rhythm.   Pulmonary/Chest: Effort normal and breath sounds normal.  Musculoskeletal: Edema: there is 2+ edema of the ankles.  Neurological: She is alert and oriented to person, place, and time.   BP 138/61 (BP Location: Right Arm, Patient Position: Sitting, Cuff Size: Normal)   Pulse 64   Temp 97 F (36.1 C) (Oral)   Ht 5' 6.5" (1.689 m)   Wt 179 lb 6.4 oz (81.4 kg)   SpO2 93% Comment: after sitting for a few mins  BMI 28.52 kg/m         Assessment & Plan:  1. Essential hypertension Blood pressure  is well controlled on current regimen of carvedilol and Lasix   2. Acute on chronic diastolic congestive heart failure (HCC) No shortness of breath or excessive weight gain. Pulse ox at home has been greater than 90%  3. Chronic obstructive pulmonary disease with acute exacerbation ([place]) 10 use on [place] and includes. I called in albuterol inhaler which she knows is a rescue inhaler to use if she becomes short of breath.  Wardell Honour MD

## 2016-05-10 ENCOUNTER — Other Ambulatory Visit: Payer: Self-pay | Admitting: Family Medicine

## 2016-06-06 ENCOUNTER — Other Ambulatory Visit: Payer: Self-pay | Admitting: Family Medicine

## 2016-06-10 ENCOUNTER — Ambulatory Visit (INDEPENDENT_AMBULATORY_CARE_PROVIDER_SITE_OTHER): Payer: Medicare Other | Admitting: Family Medicine

## 2016-06-10 ENCOUNTER — Encounter: Payer: Self-pay | Admitting: Family Medicine

## 2016-06-10 VITALS — BP 150/64 | HR 63 | Temp 97.5°F | Ht 66.0 in | Wt 179.0 lb

## 2016-06-10 DIAGNOSIS — E118 Type 2 diabetes mellitus with unspecified complications: Secondary | ICD-10-CM | POA: Diagnosis not present

## 2016-06-10 DIAGNOSIS — I5033 Acute on chronic diastolic (congestive) heart failure: Secondary | ICD-10-CM | POA: Diagnosis not present

## 2016-06-10 DIAGNOSIS — I10 Essential (primary) hypertension: Secondary | ICD-10-CM

## 2016-06-10 DIAGNOSIS — Z23 Encounter for immunization: Secondary | ICD-10-CM | POA: Diagnosis not present

## 2016-06-10 LAB — BAYER DCA HB A1C WAIVED: HB A1C: 5.9 % (ref <7.0)

## 2016-06-10 NOTE — Progress Notes (Signed)
Subjective:    Patient ID: Sonya Oliver, female    DOB: Jan 18, 1941, 75 y.o.   MRN: [id number]  HPI 75 year old female here to follow-up heart failure, type 2 diabetes and COPD. Since her last visit she's done pretty well. As long as she stays in the house her breathing is good. She monitors her weight but not regularly. She knows to call here if weight has significantly increased from 1 down to the next. Her last A1c was 6.03 months ago.  Patient Active Problem List   Diagnosis Date Noted  . COPD exacerbation ([place]) 02/05/2016  . Hypoxia 12/14/2015  . Diabetes mellitus, type 2 ([place]) 12/14/2015  . Lobar pneumonia due to unspecified organism 12/14/2015  . Acute respiratory failure with hypoxia ([place]) 12/14/2015  . Acute respiratory failure ([place]) 12/14/2015  . Hypokalemia 08/22/2013  . Acute on chronic diastolic heart failure ([place]) 06/20/2013  . Acute-on-chronic respiratory failure ([place]) 06/20/2013  . CHF (congestive heart failure) ([place]) 12/19/2012  . COPD (chronic obstructive pulmonary disease) ([place]) 12/19/2012  . Unspecified vitamin D deficiency 12/19/2012  . HTN (hypertension) 12/19/2012  . SOB (shortness of breath) 09/12/2012  . Edema leg 09/12/2012  . Diabetes ([place]) 09/12/2012  . Anemia 03/21/2012  . Syncope 03/21/2012  . Hematuria, gross 02/23/2012  . Anemia associated with acute blood loss 02/23/2012  . Orthostatic hypotension 02/23/2012  . Bladder mass 02/23/2012  . Hyperthyroidism 02/23/2012   Outpatient Encounter Prescriptions as of 06/10/2016  Medication Sig  . albuterol (PROVENTIL HFA;VENTOLIN HFA) 108 (90 Base) MCG/ACT inhaler Inhale 1-2 puffs into the lungs every 6 (six) hours as needed for wheezing or shortness of breath.  . carvedilol (COREG) 3.125 MG tablet TAKE 1 TABLET BY MOUTH TWICE DAILY  . Cholecalciferol (VITAMIN D3) 2000 UNITS TABS Take 1 tablet by mouth daily.  . fluticasone furoate-vilanterol (BREO ELLIPTA) 100-25 MCG/INH AEPB Inhale 1 puff into the  lungs daily.  . furosemide (LASIX) 40 MG tablet TAKE 1 TABLET BY MOUTH ONCE DAILY  . guaiFENesin (MUCINEX) 600 MG 12 hr tablet Take 1 tablet (600 mg total) by mouth 2 (two) times daily.  . INCRUSE ELLIPTA 62.5 MCG/INH AEPB   . KLOR-CON M20 20 MEQ tablet TAKE 1 TABLET (20 MEQ TOTAL) BY MOUTH DAILY.  . metFORMIN (GLUCOPHAGE) 500 MG tablet Take 1 tablet (500 mg total) by mouth daily.  . RESTASIS MULTIDOSE 0.05 % ophthalmic emulsion Place 1 drop into both eyes 2 (two) times daily.   No facility-administered encounter medications on file as of 06/10/2016.       Review of Systems  Constitutional: Negative.   Respiratory: Positive for shortness of breath.   Cardiovascular: Negative.  Negative for leg swelling.  Neurological: Negative.        Objective:   Physical Exam  Constitutional: She appears well-developed and well-nourished.  HENT:  Head: Normocephalic.  Mouth/Throat: Oropharynx is clear and moist.  Cardiovascular: Normal rate and regular rhythm.   Pulmonary/Chest: Effort normal and breath sounds normal. She has no wheezes. She has no rales.  Neurological: She is alert.  Psychiatric: She has a normal mood and affect. Her behavior is normal.   BP (!) 150/64   Pulse 63   Temp 97.5 F (36.4 C) (Oral)   Ht 5' 6"  (1.676 m)   Wt 179 lb (81.2 kg)   BMI 28.89 kg/m         Assessment & Plan:  1. Type 2 diabetes mellitus with complication, without long-term current use of insulin (HCC) Last A1c  was excellent. Expect similar results today - Bayer DCA Hb A1c Waived  2. Essential hypertension Blood pressure is good at 145/63 medications include carvedilol and furosemide - BMP8+EGFR  3. Encounter for immunization  - Flu Vaccine QUAD 36+ mos IM  4. Acute on chronic diastolic congestive heart failure (HCC) Heart failure is stable. There've been no recent exacerbations. Continue with Lasix and potassium and salt restriction  Wardell Honour MD

## 2016-06-11 LAB — BMP8+EGFR
BUN/Creatinine Ratio: 21 (ref 12–28)
BUN: 17 mg/dL (ref 8–27)
CALCIUM: 9.1 mg/dL (ref 8.7–10.3)
CO2: 26 mmol/L (ref 18–29)
CREATININE: 0.8 mg/dL (ref 0.57–1.00)
Chloride: 99 mmol/L (ref 96–106)
GFR calc Af Amer: 83 mL/min/{1.73_m2} (ref >59)
GFR, EST NON AFRICAN AMERICAN: 72 mL/min/{1.73_m2} (ref >59)
Glucose: 74 mg/dL (ref 65–99)
Potassium: 4.5 mmol/L (ref 3.5–5.2)
Sodium: 142 mmol/L (ref 134–144)

## 2016-06-28 ENCOUNTER — Telehealth: Payer: Self-pay | Admitting: *Deleted

## 2016-06-28 MED ORDER — FUROSEMIDE 40 MG PO TABS: ORAL_TABLET | ORAL | 1 refills | Status: DC

## 2016-06-28 MED ORDER — METFORMIN HCL 500 MG PO TABS: 500.0000 mg | ORAL_TABLET | Freq: Every day | ORAL | 1 refills | Status: DC

## 2016-06-28 NOTE — Telephone Encounter (Signed)
done

## 2016-07-08 ENCOUNTER — Ambulatory Visit (INDEPENDENT_AMBULATORY_CARE_PROVIDER_SITE_OTHER): Payer: Medicare Other | Admitting: Pharmacist

## 2016-07-08 ENCOUNTER — Encounter: Payer: Self-pay | Admitting: Pharmacist

## 2016-07-08 VITALS — BP 139/78 | HR 60 | Ht 66.0 in | Wt 180.2 lb

## 2016-07-08 DIAGNOSIS — Z87891 Personal history of nicotine dependence: Secondary | ICD-10-CM

## 2016-07-08 DIAGNOSIS — Z0000 Encounter for general adult medical examination without abnormal findings: Secondary | ICD-10-CM

## 2016-07-08 DIAGNOSIS — M810 Age-related osteoporosis without current pathological fracture: Secondary | ICD-10-CM | POA: Insufficient documentation

## 2016-07-08 DIAGNOSIS — E119 Type 2 diabetes mellitus without complications: Secondary | ICD-10-CM

## 2016-07-08 MED ORDER — ALENDRONATE SODIUM 70 MG PO TABS: 70.0000 mg | ORAL_TABLET | ORAL | 11 refills | Status: DC

## 2016-07-08 NOTE — Patient Instructions (Addendum)
Sonya Oliver , Thank you for taking time to come for your Medicare Wellness Visit. I appreciate your ongoing commitment to your health goals. Please review the following plan we discussed and let me know if I can assist you in the future.   These are the goals we discussed: Start alendronate '70mg'$  - take 1 tablet once a week for your bones.   I am sending referral for lung cancer screening / lung CT to [facility]  Make appointment for nail care with Dr Irving Shows.  Look for copy of [place] (important to know where these are kept - you can also bring copy to our office to be placed in our file / electronic chart)   This is a list of the screening recommended for you and due dates:  Health Maintenance  Topic Date Due  . Hemoglobin A1C  12/09/2016  . Eye exam for diabetics  03/18/2017  . Complete foot exam   07/08/2017  . Urine Protein Check  07/08/2017  . Mammogram  09/29/2017  . Tetanus Vaccine  04/25/2021  . Flu Shot  Completed  . DEXA scan (bone density measurement)  Completed  . Shingles Vaccine  Completed  . Pneumonia vaccines  Completed    Osteoporosis Osteoporosis is the thinning and loss of density in the bones. Osteoporosis makes the bones more brittle, fragile, and likely to break (fracture). Over time, osteoporosis can cause the bones to become so weak that they fracture after a simple fall. The bones most likely to fracture are the bones in the hip, wrist, and spine. CAUSES  The exact cause is not known. RISK FACTORS Anyone can develop osteoporosis. You may be at greater risk if you have a family history of the condition or have poor nutrition. You may also have a higher risk if you are:   Female.   35 years old or older.  A smoker.  Not physically active.   White or Asian.  Slender. SIGNS AND SYMPTOMS  A fracture might be the first sign of the disease, especially if it results from a fall or injury that  would not usually cause a bone to break. Other signs and symptoms include:   Low back and neck pain.  Stooped posture.  Height loss. DIAGNOSIS  To make a diagnosis, your health care provider may:  Take a medical history.  Perform a physical exam.  Order tests, such as:  A bone mineral density test.  A dual-energy X-ray absorptiometry test. TREATMENT  The goal of osteoporosis treatment is to strengthen your bones to reduce your risk of a fracture. Treatment may involve:  Making lifestyle changes, such as:  Eating a diet rich in calcium.  Doing weight-bearing and muscle-strengthening exercises.  Stopping tobacco use.  Limiting alcohol intake.  Taking medicine to slow the process of bone loss or to increase bone density.  Monitoring your levels of calcium and vitamin D. HOME CARE INSTRUCTIONS  Include calcium and vitamin D in your diet. Calcium is important for bone health, and vitamin D helps the body absorb calcium.  Perform weight-bearing and muscle-strengthening exercises as directed by your health care provider.  Do not use any tobacco products, including cigarettes, chewing tobacco, and electronic cigarettes. If you need help quitting, ask your health care provider.  Limit your alcohol intake.  Take medicines only as directed by your health care provider.  Keep all follow-up visits as directed by your health care provider. This is  important.  Take precautions at home to lower your risk of falling, such as:  Keeping rooms well lit and clutter free.  Installing safety rails on stairs.  Using rubber mats in the bathroom and other areas that are often wet or slippery. SEEK IMMEDIATE MEDICAL CARE IF:  You fall or injure yourself.    This information is not intended to replace advice given to you by your health care provider. Make sure you discuss any questions you have with your health care provider.   Document Released: 05/26/2005 Document Revised:  09/06/2014 Document Reviewed: 01/24/2014 Elsevier Interactive Patient Education 2016 [place] in the Home  Falls can cause injuries and can affect people from all age groups. There are many simple things that you can do to make your home safe and to help prevent falls. WHAT CAN I DO ON THE OUTSIDE OF MY HOME?  Regularly repair the edges of walkways and driveways and fix any cracks.  Remove high doorway thresholds.  Trim any shrubbery on the main path into your home.  Use bright outdoor lighting.  Clear walkways of debris and clutter, including tools and rocks.  Regularly check that handrails are securely fastened and in good repair. Both sides of any steps should have handrails.  Install guardrails along the edges of any raised decks or porches.  Have leaves, snow, and ice cleared regularly.  Use sand or salt on walkways during winter months.  In the garage, clean up any spills right away, including grease or oil spills. WHAT CAN I DO IN THE BATHROOM?  Use night lights.  Install grab bars by the toilet and in the tub and shower. Do not use towel bars as grab bars.  Use non-skid mats or decals on the floor of the tub or shower.  If you need to sit down while you are in the shower, use a plastic, non-slip stool.[name]  Keep the floor dry. Immediately clean up any water that spills on the floor.  Remove soap buildup in the tub or shower on a regular basis.  Attach bath mats securely with double-sided non-slip rug tape.  Remove throw rugs and other tripping hazards from the floor. WHAT CAN I DO IN THE BEDROOM?  Use night lights.  Make sure that a bedside light is easy to reach.  Do not use oversized bedding that drapes onto the floor.  Have a firm chair that has side arms to use for getting dressed.  Remove throw rugs and other tripping hazards from the floor. WHAT CAN I DO IN THE KITCHEN?   Clean up any spills right away.  Avoid walking on  wet floors.  Place frequently used items in easy-to-reach places.  If you need to reach for something above you, use a sturdy step stool that has a grab bar.  Keep electrical cables out of the way.  Do not use floor polish or wax that makes floors slippery. If you have to use wax, make sure that it is non-skid floor wax.  Remove throw rugs and other tripping hazards from the floor. WHAT CAN I DO IN THE STAIRWAYS?  Do not leave any items on the stairs.  Make sure that there are handrails on both sides of the stairs. Fix handrails that are broken or loose. Make sure that handrails are as long as the stairways.  Check any carpeting to make sure that it is firmly attached to the stairs. Fix any carpet that is loose or worn.  Avoid having throw rugs at the top or bottom of stairways, or secure the rugs with carpet tape to prevent them from moving.  Make sure that you have a light switch at the top of the stairs and the bottom of the stairs. If you do not have them, have them installed. WHAT ARE SOME OTHER FALL PREVENTION TIPS?  Wear closed-toe shoes that fit well and support your feet. Wear shoes that have rubber soles or low heels.  When you use a stepladder, make sure that it is completely opened and that the sides are firmly locked. Have someone hold the ladder while you are using it. Do not climb a closed stepladder.  Add color or contrast paint or tape to grab bars and handrails in your home. Place contrasting color strips on the first and last steps.  Use mobility aids as needed, such as canes, walkers, scooters, and crutches.  Turn on lights if it is dark. Replace any light bulbs that burn out.  Set up furniture so that there are clear paths. Keep the furniture in the same spot.  Fix any uneven floor surfaces.  Choose a carpet design that does not hide the edge of steps of a stairway.  Be aware of any and all pets.  Review your medicines with your healthcare provider. Some  medicines can cause dizziness or changes in blood pressure, which increase your risk of falling. Talk with your health care provider about other ways that you can decrease your risk of falls. This may include working with a physical therapist or trainer to improve your strength, balance, and endurance.   This information is not intended to replace advice given to you by your health care provider. Make sure you discuss any questions you have with your health care provider.   Document Released: 08/06/2002 Document Revised: 12/31/2014 Document Reviewed: 09/20/2014 Elsevier Interactive Patient Education [employer].

## 2016-07-08 NOTE — Progress Notes (Signed)
Patient ID: Sonya Oliver, female   DOB: 10/29/1940, 75 y.o.   MRN: [id number]     Subjective:   Sonya Oliver is a 75 y.o. female who presents for a Subsequent Medicare Annual Wellness Visit.  Sonya Oliver is widowed.  She lives with her son and daughter in [place].  She retired in 2002 from [employer] where she worked for 30 plus years.    Review of Systems  Constitutional: Negative.   HENT: Negative.   Eyes: Negative.   Respiratory: Negative.   Cardiovascular: Positive for leg swelling.  Gastrointestinal: Positive for heartburn (occasional / about once per week- relieved by Tums). Negative for abdominal pain, constipation, diarrhea, nausea and vomiting.  Genitourinary: Negative.   Musculoskeletal: Positive for joint pain.  Skin: Negative.   Neurological: Negative.   Endo/Heme/Allergies: Negative.   Psychiatric/Behavioral: Negative.     Current Medications (verified) Outpatient Encounter Prescriptions as of 07/08/2016  Medication Sig  . albuterol (PROVENTIL HFA;VENTOLIN HFA) 108 (90 Base) MCG/ACT inhaler Inhale 1-2 puffs into the lungs every 6 (six) hours as needed for wheezing or shortness of breath.  . carvedilol (COREG) 3.125 MG tablet TAKE 1 TABLET BY MOUTH TWICE DAILY  . Cholecalciferol (VITAMIN D3) 2000 UNITS TABS Take 1 tablet by mouth daily.  . fluticasone furoate-vilanterol (BREO ELLIPTA) 100-25 MCG/INH AEPB Inhale 1 puff into the lungs daily.  . furosemide (LASIX) 40 MG tablet TAKE 1 TABLET BY MOUTH ONCE DAILY  . guaiFENesin (MUCINEX) 600 MG 12 hr tablet Take 1 tablet (600 mg total) by mouth 2 (two) times daily.  . INCRUSE ELLIPTA 62.5 MCG/INH AEPB Take 1 puff by mouth daily.  [name] KLOR-CON M20 20 MEQ tablet TAKE 1 TABLET (20 MEQ TOTAL) BY MOUTH DAILY.  . metFORMIN (GLUCOPHAGE) 500 MG tablet Take 1 tablet (500 mg total) by mouth daily.  . Prenatal Vit-Fe Fumarate-FA (M-VIT) tablet Take 1 tablet by mouth daily.  . RESTASIS MULTIDOSE 0.05 % ophthalmic emulsion Place 1 drop into  both eyes 2 (two) times daily.  [name] alendronate (FOSAMAX) 70 MG tablet Take 1 tablet (70 mg total) by mouth once a week. Take with a full glass of water on an empty stomach.   No facility-administered encounter medications on file as of 07/08/2016.     Allergies (verified) Patient has no known allergies.   History: Past Medical History:  Diagnosis Date  . Arthritis   . Bladder tumor   . Cancer ([place])    bladder  . Cataract   . CHF (congestive heart failure) ([place])    NO CARDIOLOGIST---  MONITORED BY PCP DR Jacelyn Grip  . COPD with emphysema ([place])    last Acute Exacerbation 08-21-2014  . Dyspnea on exertion   . H/O pulmonary edema    jan 2014--  acute CHF w/ pulmonary edema  . Hypertension   . Hyperthyroidism    currently no meds -- labs stable  . Nocturia   . Nocturnal oxygen desaturation    uses O2  HS via [place]--  and PRN daytime  . Osteoporosis   . Stroke ([place])   . Type 2 diabetes mellitus ([place])   . Ureteral tumor    Past Surgical History:  Procedure Laterality Date  . ABDOMINAL HYSTERECTOMY  1980'S   W/ BILATERAL SALPINGO-OOPHORECTOMY  . CATARACT EXTRACTION W/PHACO Left 01/31/2014   Procedure: CATARACT EXTRACTION PHACO AND INTRAOCULAR LENS PLACEMENT (IOC);  Surgeon: Tonny Branch, MD;  Location: [facility];  Service: Ophthalmology;  Laterality: Left;  CDE: 23.78  . CATARACT EXTRACTION  W/PHACO Right 02/28/2014   Procedure: CATARACT EXTRACTION PHACO AND INTRAOCULAR LENS PLACEMENT (IOC);  Surgeon: Tonny Branch, MD;  Location: [facility];  Service: Ophthalmology;  Laterality: Right;  CDE:  11.34  . CYSTOSCOPY  03/23/2012   Procedure: CYSTOSCOPY;  Surgeon: Claybon Jabs, MD;  Location: [facility];  Service: Urology;  Laterality: N/A;  . CYSTOSCOPY WITH RETROGRADE PYELOGRAM, URETEROSCOPY AND STENT PLACEMENT Right 10/21/2014   Procedure: CYSTOSCOPY WITH RETROGRADE PYELOGRAM, URETEROSCOPY, BLADDER TUMOR BIOPSY;  Surgeon: Claybon Jabs, MD;  Location: [facility];  Service: Urology;  Laterality:  Right;  . TRANSTHORACIC ECHOCARDIOGRAM  06-21-2013   moderate LVH,  grade I diastolic dysfunction, ef 75-64%,  mild MR & TR  . TRANSURETHRAL RESECTION OF BLADDER TUMOR  03/23/2012   Procedure: TRANSURETHRAL RESECTION OF BLADDER TUMOR (TURBT);  Surgeon: Claybon Jabs, MD;  Location: [facility];  Service: Urology;  Laterality: N/A;  . TRANSURETHRAL RESECTION OF BLADDER TUMOR N/A 01/08/2013   Procedure: TRANSURETHRAL RESECTION OF BLADDER TUMOR (TURBT);  Surgeon: Claybon Jabs, MD;  Location: [facility];  Service: Urology;  Laterality: N/A;   Family History  Problem Relation Age of Onset  . Diabetes Father   . Congestive Heart Failure Father   . COPD Father   . Kidney disease Father   . Hypertension Father   . Diabetes Mother     with kidney disease  . Kidney disease Mother   . Heart attack Brother   . Asthma Brother   . Diabetes Daughter   . Diabetes Son   . Cancer Brother     throat  . Hypertension Brother    Social History   Occupational History  . Retired Risk analyst    Social History Main Topics  . Smoking status: Former Smoker    Packs/day: 1.00    Years: 50.00    Types: Cigarettes    Quit date: 09/24/2006  . Smokeless tobacco: Never Used  . Alcohol use No  . Drug use: No  . Sexual activity: No    Do you feel safe at home?  Yes  Dietary issues and exercise activities: Current Exercise Habits: The patient does not participate in regular exercise at present, Exercise limited by: respiratory conditions(s) and orthopedic conditions  Current Dietary habits:  Patient limits sugar and CHO intake due to diabetes and also does not add any salt to diet.     Objective:    Today's Vitals   07/08/16 1028  BP: 139/78  Pulse: 60  Weight: 180 lb 4 oz (81.8 kg)  Height: '5\' 6"'$  (1.676 m)  PainSc: 2   PainLoc: Knee   Body mass index is 29.09 kg/m.   Diabetic Foot Form - Detailed   Diabetic Foot Exam - detailed Diabetic Foot exam was performed with the  following findings:  Yes 07/08/2016 10:43 AM  Visual Foot Exam completed.:  Yes  Is there a history of foot ulcer?:  No Can the patient see the bottom of their feet?:  No Are the shoes appropriate in style and fit?:  Yes Is there swelling or and abnormal foot shape?:  Yes Are the toenails long?:  Yes Are the toenails thick?:  Yes Do you have pain in calf while walking?:  No Is there a claw toe deformity?:  No Is there elevated skin temparature?:  No Is there foot or ankle muscle weakness?:  Yes Are the toenails ingrown?:  No Normal Range of Motion:  No Pulse Foot Exam completed.:  Yes  Right posterior Tibialias:  Diminished Left posterior Tibialias:  Diminished  Right Dorsalis Pedis:  Diminished Left Dorsalis Pedis:  Diminished  Sensory Foot Exam Completed.:  Yes Semmes-Weinstein Monofilament Test R Foot Test Control:  Pos L Foot Test Control:  Pos  R Site 1-Great Toe:  Pos L Site 1-Great Toe:  Pos  R Site 4:  Pos L Site 4:  Pos  R Site 5:  Pos L Site 5:  Pos    Comments:  Toe nails are in need of trimming.  Dryness noted on both feet at heel and ankle area       Activities of Daily Living In your present state of health, do you have any difficulty performing the following activities: 07/08/2016 02/05/2016  Hearing? N N  Vision? N N  Difficulty concentrating or making decisions? N N  Walking or climbing stairs? Y Y  Dressing or bathing? N N  Doing errands, shopping? Sonya Oliver  Preparing Food and eating ? N -  Using the Toilet? N -  In the past six months, have you accidently leaked urine? N -  Do you have problems with loss of bowel control? N -  Managing your Medications? N -  Managing your Finances? N -  Housekeeping or managing your Housekeeping? N -  Some recent data might be hidden     Cardiac Risk Factors include: advanced age (>44mn, >>44women);diabetes mellitus;dyslipidemia;family history of premature cardiovascular disease;sedentary lifestyle  Depression Screen PHQ  2/9 Scores 07/08/2016 06/10/2016 04/07/2016 03/10/2016  PHQ - 2 Score 0 0 0 0  PHQ- 9 Score - - - -     Fall Risk Fall Risk  07/08/2016 06/10/2016 04/07/2016 03/10/2016 02/09/2016  Falls in the past year? Yes Yes No No No  Number falls in past yr: 1 - - - -  Injury with Fall? No - - - -  Risk for fall due to : Impaired mobility - - - -  Risk for fall due to (comments): uses rolling walker - - - -  Follow up Falls prevention discussed - - - -    Cognitive Function: MMSE - Mini Mental State Exam 07/08/2016 03/07/2015  Orientation to time 5 5  Orientation to Place 5 5  Registration 3 3  Attention/ Calculation 4 5  Recall 3 3  Language- name 2 objects 2 2  Language- repeat 1 1  Language- follow 3 step command 3 3  Language- read & follow direction 1 1  Write a sentence 1 1  Copy design 0 -  Total score 28 -    Immunizations and Health Maintenance Immunization History  Administered Date(s) Administered  . Influenza,inj,Quad PF,36+ Mos 06/20/2013, 05/30/2014, 06/09/2015, 06/10/2016  . Pneumococcal Conjugate-13 03/07/2015  . Pneumococcal Polysaccharide-23 04/26/2011  . Tdap 04/26/2011  . Zoster 04/16/2013   There are no preventive care reminders to display for this patient.  Patient Care Team: SWardell Honour MD as PCP - General (Family Medicine) MKathie Rhodes MD as Consulting Physician (Urology) CSteffanie Rainwater DPM as Consulting Physician (Podiatry) Truc LManus Gunning OD as Consulting Physician (Optometry) ESinda Du MD as Consulting Physician (Pulmonary Disease) BJacelyn Pi MD as Consulting Physician (Endocrinology)  Indicate any recent Medical Services you may have received from other than [facility] providers in the past year (date may be approximate).    Assessment:    Annual Wellness Visit  Osteoporosis Edema  occ heart burn - controlled with as needed Tums Smoking History - 50 pack years, quite  9 years ago  Screening Tests Health Maintenance  Topic Date Due  .  HEMOGLOBIN A1C  12/09/2016  . OPHTHALMOLOGY EXAM  03/18/2017  . FOOT EXAM  07/08/2017  . URINE MICROALBUMIN  07/08/2017  . MAMMOGRAM  09/29/2017  . TETANUS/TDAP  04/25/2021  . INFLUENZA VACCINE  Completed  . DEXA SCAN  Completed  . ZOSTAVAX  Completed  . PNA vac Low Risk Adult  Completed        Plan:   During the course of the visit Sonya Oliver was educated and counseled about the following appropriate screening and preventive services:   Vaccines to include Pneumoccal, Influenza, Td, Zostavax - UTD  Colorectal cancer screening - UTD  Cardiovascular disease screening - EKG is UTD  BP at goal today  Last LDL was elevated - due recheck at next visit with PCP  Diabetes - well controlled per last A1c of 5.9%  Bone Denisty / Osteoporosis Screening - DEXA done 03/07/2015.  Ostoeporosis but patient states has not been reviewed with her.  Reviewed results and alendronate '70mg'$  - take 1 tablet weekly started.  Repeat DEXA in 1 year.  Calcium '1200mg'$  recommended from diet and/or supplement (MVI + yogurt daily)  Fall prevention discussed  Mammogram - UTD  Foot exam - patient to make appt with Dr Irving Shows for follow up of nail care.   Recommended apply lotion to feet qd to bid for dryness / avoiding areas between toes.    PAP - no longer required.  Glaucoma screening / Diabetic Eye Exam - UTD  Nutrition counseling - continue to limit salt and sugar intake.  Discussed increasing calcium intake for bone health  Continue vitamin D 2000IU daily  Lung Cancer Screeing ordered / Lung CT   Advanced Directives - UTD, copy requested  Physical Activity - chair exercises as able.  Orders Placed This Encounter  Procedures  . CT CHEST LUNG CA SCREEN LOW DOSE W/O CM    Order Specific Question:   Reason for Exam (SYMPTOM  OR DIAGNOSIS REQUIRED)    Answer:   50 pack year smoking history - quit in 2008 or 9 years ago    Order Specific Question:   Preferred Imaging Location?    Answer:   [facility] / creatinine urine ratio       Patient Instructions (the written plan) were given to the patient.   Cherre Robins, PharmD   07/08/2016

## 2016-07-09 LAB — MICROALBUMIN / CREATININE URINE RATIO
CREATININE, UR: 172.3 mg/dL
MICROALBUM., U, RANDOM: 13.6 ug/mL
Microalb/Creat Ratio: 7.9 mg/g creat (ref 0.0–30.0)

## 2016-07-19 ENCOUNTER — Ambulatory Visit (HOSPITAL_COMMUNITY)
Admission: RE | Admit: 2016-07-19 | Discharge: 2016-07-19 | Disposition: A | Discharge status: Home / Self Care | Payer: Medicare Other | Source: Ambulatory Visit | Attending: Pharmacist | Admitting: Pharmacist

## 2016-07-19 DIAGNOSIS — I700 Atherosclerosis of aorta: Secondary | ICD-10-CM | POA: Diagnosis not present

## 2016-07-19 DIAGNOSIS — Z122 Encounter for screening for malignant neoplasm of respiratory organs: Secondary | ICD-10-CM | POA: Insufficient documentation

## 2016-07-19 DIAGNOSIS — I2510 Atherosclerotic heart disease of native coronary artery without angina pectoris: Secondary | ICD-10-CM | POA: Insufficient documentation

## 2016-07-19 DIAGNOSIS — Z87891 Personal history of nicotine dependence: Secondary | ICD-10-CM | POA: Insufficient documentation

## 2016-07-19 DIAGNOSIS — J439 Emphysema, unspecified: Secondary | ICD-10-CM | POA: Diagnosis not present

## 2016-07-20 ENCOUNTER — Encounter: Payer: Self-pay | Admitting: Pharmacist

## 2016-07-20 DIAGNOSIS — I700 Atherosclerosis of aorta: Secondary | ICD-10-CM | POA: Insufficient documentation

## 2016-07-20 DIAGNOSIS — Z87891 Personal history of nicotine dependence: Secondary | ICD-10-CM | POA: Insufficient documentation

## 2016-08-03 ENCOUNTER — Other Ambulatory Visit (HOSPITAL_COMMUNITY): Payer: Self-pay | Admitting: Respiratory Therapy

## 2016-08-03 DIAGNOSIS — J441 Chronic obstructive pulmonary disease with (acute) exacerbation: Secondary | ICD-10-CM

## 2016-08-10 NOTE — Progress Notes (Signed)
Subjective:    Patient ID: Sonya Oliver, female    DOB: 22-Sep-1940, 75 y.o.   MRN: [id number]  HPI 75 year old female with history of type 2 diabetes, congestive heart failure, COPD,. She is really feeling pretty well. She questions whether or not she needs to take Fosamax. She read on the package insert about side effects and is reluctant. She does have a history of osteoporosis. I think with a little reassurance she will take the Fosamax. Ask her to be sure include calcium and vitamin D supplements along with the bisphosphonate.  Patient Active Problem List   Diagnosis Date Noted  . History of smoking 25-50 pack years 07/20/2016  . Aortic atherosclerosis ([place]) 07/20/2016  . Osteoporosis 07/08/2016  . COPD exacerbation ([place]) 02/05/2016  . Hypoxia 12/14/2015  . Diabetes mellitus, type 2 ([place]) 12/14/2015  . Lobar pneumonia due to unspecified organism 12/14/2015  . Acute respiratory failure with hypoxia ([place]) 12/14/2015  . Acute respiratory failure ([place]) 12/14/2015  . Hypokalemia 08/22/2013  . Acute on chronic diastolic heart failure ([place]) 06/20/2013  . Acute-on-chronic respiratory failure ([place]) 06/20/2013  . CHF (congestive heart failure) ([place]) 12/19/2012  . COPD (chronic obstructive pulmonary disease) ([place]) 12/19/2012  . Unspecified vitamin D deficiency 12/19/2012  . HTN (hypertension) 12/19/2012  . SOB (shortness of breath) 09/12/2012  . Edema leg 09/12/2012  . Diabetes ([place]) 09/12/2012  . Anemia 03/21/2012  . Syncope 03/21/2012  . Hematuria, gross 02/23/2012  . Anemia associated with acute blood loss 02/23/2012  . Orthostatic hypotension 02/23/2012  . Bladder mass 02/23/2012  . Hyperthyroidism 02/23/2012   Outpatient Encounter Prescriptions as of 08/11/2016  Medication Sig  . albuterol (PROVENTIL HFA;VENTOLIN HFA) 108 (90 Base) MCG/ACT inhaler Inhale 1-2 puffs into the lungs every 6 (six) hours as needed for wheezing or shortness of breath.  [name] alendronate (FOSAMAX) 70  MG tablet Take 1 tablet (70 mg total) by mouth once a week. Take with a full glass of water on an empty stomach.  . carvedilol (COREG) 3.125 MG tablet TAKE 1 TABLET BY MOUTH TWICE DAILY  . Cholecalciferol (VITAMIN D3) 2000 UNITS TABS Take 1 tablet by mouth daily.  . fluticasone furoate-vilanterol (BREO ELLIPTA) 100-25 MCG/INH AEPB Inhale 1 puff into the lungs daily.  . furosemide (LASIX) 40 MG tablet TAKE 1 TABLET BY MOUTH ONCE DAILY  . guaiFENesin (MUCINEX) 600 MG 12 hr tablet Take 1 tablet (600 mg total) by mouth 2 (two) times daily.  . INCRUSE ELLIPTA 62.5 MCG/INH AEPB Take 1 puff by mouth daily.  [name] KLOR-CON M20 20 MEQ tablet TAKE 1 TABLET (20 MEQ TOTAL) BY MOUTH DAILY.  . metFORMIN (GLUCOPHAGE) 500 MG tablet Take 1 tablet (500 mg total) by mouth daily.  . Prenatal Vit-Fe Fumarate-FA (M-VIT) tablet Take 1 tablet by mouth daily.  . RESTASIS MULTIDOSE 0.05 % ophthalmic emulsion Place 1 drop into both eyes 2 (two) times daily.   No facility-administered encounter medications on file as of 08/11/2016.       Review of Systems  Constitutional: Negative.   Respiratory: Negative.   Cardiovascular: Negative.   Neurological: Negative.   Psychiatric/Behavioral: Negative.        Objective:   Physical Exam  Constitutional: She is oriented to person, place, and time. She appears well-developed and well-nourished.  Cardiovascular: Normal rate, regular rhythm and normal heart sounds.   Pulmonary/Chest: Effort normal and breath sounds normal.  Musculoskeletal: She exhibits edema.  Neurological: She is alert and oriented to person, place, and time.  Psychiatric: She has a normal mood and affect. Her behavior is normal.   BP (!) 160/57 (BP Location: Right Arm, Patient Position: Sitting, Cuff Size: Normal)   Pulse 64   Temp (!) 96.7 F (35.9 C) (Oral)   Ht '5\' 6"'$  (1.676 m)   Wt 184 lb (83.5 kg)   BMI 29.70 kg/m         Assessment & Plan:  1. Essential hypertension Blood pressure  elevated today but it has been well controlled on previous visits  2. Acute on chronic diastolic heart failure (HCC) Heart failure appears well compensated there are no symptoms or physical findings today  3  4. Chronic obstructive pulmonary disease with acute exacerbation (HCC) Breathing is doing good on combination steroid long-acting bronchodilator and incruz  Wardell Honour MD

## 2016-08-11 ENCOUNTER — Ambulatory Visit (INDEPENDENT_AMBULATORY_CARE_PROVIDER_SITE_OTHER): Payer: Medicare Other | Admitting: Family Medicine

## 2016-08-11 ENCOUNTER — Encounter: Payer: Self-pay | Admitting: Family Medicine

## 2016-08-11 VITALS — BP 160/57 | HR 64 | Temp 96.7°F | Ht 66.0 in | Wt 184.0 lb

## 2016-08-11 DIAGNOSIS — I5033 Acute on chronic diastolic (congestive) heart failure: Secondary | ICD-10-CM

## 2016-08-11 DIAGNOSIS — J441 Chronic obstructive pulmonary disease with (acute) exacerbation: Secondary | ICD-10-CM | POA: Diagnosis not present

## 2016-08-11 DIAGNOSIS — I10 Essential (primary) hypertension: Secondary | ICD-10-CM

## 2016-08-18 ENCOUNTER — Ambulatory Visit (HOSPITAL_COMMUNITY)
Admission: RE | Admit: 2016-08-18 | Discharge: 2016-08-18 | Disposition: A | Discharge status: Home / Self Care | Payer: Medicare Other | Source: Ambulatory Visit | Attending: Pulmonary Disease | Admitting: Pulmonary Disease

## 2016-08-18 DIAGNOSIS — J441 Chronic obstructive pulmonary disease with (acute) exacerbation: Secondary | ICD-10-CM | POA: Insufficient documentation

## 2016-08-18 LAB — PULMONARY FUNCTION TEST
DL/VA % pred: 60 %
DL/VA: 3 ml/min/mmHg/L
DLCO UNC % PRED: 31 %
DLCO unc: 8.14 ml/min/mmHg
FEF 25-75 PRE: 0.29 L/s
FEF 25-75 Post: 0.36 L/sec
FEF2575-%CHANGE-POST: 23 %
FEF2575-%PRED-POST: 24 %
FEF2575-%Pred-Pre: 19 %
FEV1-%CHANGE-POST: 6 %
FEV1-%PRED-POST: 48 %
FEV1-%PRED-PRE: 45 %
FEV1-POST: 0.84 L
FEV1-PRE: 0.79 L
FEV1FVC-%CHANGE-POST: -5 %
FEV1FVC-%Pred-Pre: 64 %
FEV6-%CHANGE-POST: 9 %
FEV6-%PRED-POST: 77 %
FEV6-%PRED-PRE: 70 %
FEV6-PRE: 1.52 L
FEV6-Post: 1.66 L
FEV6FVC-%Change-Post: -3 %
FEV6FVC-%PRED-PRE: 98 %
FEV6FVC-%Pred-Post: 94 %
FVC-%CHANGE-POST: 13 %
FVC-%PRED-POST: 81 %
FVC-%Pred-Pre: 71 %
FVC-Post: 1.82 L
FVC-Pre: 1.61 L
POST FEV1/FVC RATIO: 46 %
POST FEV6/FVC RATIO: 91 %
PRE FEV6/FVC RATIO: 95 %
Pre FEV1/FVC ratio: 49 %
RV % PRED: 146 %
RV: 3.49 L
TLC % pred: 96 %
TLC: 5.02 L

## 2016-08-18 MED ORDER — ALBUTEROL SULFATE (2.5 MG/3ML) 0.083% IN NEBU
2.5000 mg | INHALATION_SOLUTION | Freq: Once | RESPIRATORY_TRACT | Status: AC
  Administered 2016-08-18: 2.5 mg via RESPIRATORY_TRACT

## 2016-09-28 ENCOUNTER — Emergency Department (HOSPITAL_COMMUNITY): Payer: Medicare Other

## 2016-09-28 ENCOUNTER — Encounter (HOSPITAL_COMMUNITY): Payer: Self-pay | Admitting: *Deleted

## 2016-09-28 ENCOUNTER — Inpatient Hospital Stay (HOSPITAL_COMMUNITY)
Admission: EM | Admit: 2016-09-28 | Discharge: 2016-10-02 | DRG: 190 | Disposition: A | Discharge status: Home Health | Payer: Medicare Other | Attending: Internal Medicine | Admitting: Internal Medicine

## 2016-09-28 DIAGNOSIS — J9620 Acute and chronic respiratory failure, unspecified whether with hypoxia or hypercapnia: Secondary | ICD-10-CM | POA: Diagnosis present

## 2016-09-28 DIAGNOSIS — Z79899 Other long term (current) drug therapy: Secondary | ICD-10-CM

## 2016-09-28 DIAGNOSIS — R351 Nocturia: Secondary | ICD-10-CM | POA: Diagnosis present

## 2016-09-28 DIAGNOSIS — I509 Heart failure, unspecified: Secondary | ICD-10-CM

## 2016-09-28 DIAGNOSIS — J9691 Respiratory failure, unspecified with hypoxia: Secondary | ICD-10-CM | POA: Diagnosis present

## 2016-09-28 DIAGNOSIS — J9601 Acute respiratory failure with hypoxia: Secondary | ICD-10-CM

## 2016-09-28 DIAGNOSIS — J441 Chronic obstructive pulmonary disease with (acute) exacerbation: Secondary | ICD-10-CM | POA: Diagnosis not present

## 2016-09-28 DIAGNOSIS — Z833 Family history of diabetes mellitus: Secondary | ICD-10-CM

## 2016-09-28 DIAGNOSIS — R0602 Shortness of breath: Secondary | ICD-10-CM | POA: Diagnosis present

## 2016-09-28 DIAGNOSIS — I10 Essential (primary) hypertension: Secondary | ICD-10-CM | POA: Diagnosis present

## 2016-09-28 DIAGNOSIS — Z66 Do not resuscitate: Secondary | ICD-10-CM | POA: Diagnosis present

## 2016-09-28 DIAGNOSIS — J9692 Respiratory failure, unspecified with hypercapnia: Secondary | ICD-10-CM

## 2016-09-28 DIAGNOSIS — I110 Hypertensive heart disease with heart failure: Secondary | ICD-10-CM | POA: Diagnosis present

## 2016-09-28 DIAGNOSIS — I5032 Chronic diastolic (congestive) heart failure: Secondary | ICD-10-CM | POA: Diagnosis present

## 2016-09-28 DIAGNOSIS — Z8249 Family history of ischemic heart disease and other diseases of the circulatory system: Secondary | ICD-10-CM

## 2016-09-28 DIAGNOSIS — Z9842 Cataract extraction status, left eye: Secondary | ICD-10-CM

## 2016-09-28 DIAGNOSIS — Z9981 Dependence on supplemental oxygen: Secondary | ICD-10-CM

## 2016-09-28 DIAGNOSIS — M79606 Pain in leg, unspecified: Secondary | ICD-10-CM

## 2016-09-28 DIAGNOSIS — M1990 Unspecified osteoarthritis, unspecified site: Secondary | ICD-10-CM | POA: Diagnosis present

## 2016-09-28 DIAGNOSIS — M810 Age-related osteoporosis without current pathological fracture: Secondary | ICD-10-CM | POA: Diagnosis present

## 2016-09-28 DIAGNOSIS — E119 Type 2 diabetes mellitus without complications: Secondary | ICD-10-CM | POA: Diagnosis present

## 2016-09-28 DIAGNOSIS — Z7983 Long term (current) use of bisphosphonates: Secondary | ICD-10-CM

## 2016-09-28 DIAGNOSIS — Z8673 Personal history of transient ischemic attack (TIA), and cerebral infarction without residual deficits: Secondary | ICD-10-CM

## 2016-09-28 DIAGNOSIS — Z961 Presence of intraocular lens: Secondary | ICD-10-CM | POA: Diagnosis present

## 2016-09-28 DIAGNOSIS — Z8551 Personal history of malignant neoplasm of bladder: Secondary | ICD-10-CM

## 2016-09-28 DIAGNOSIS — E0590 Thyrotoxicosis, unspecified without thyrotoxic crisis or storm: Secondary | ICD-10-CM | POA: Diagnosis present

## 2016-09-28 DIAGNOSIS — J9622 Acute and chronic respiratory failure with hypercapnia: Secondary | ICD-10-CM | POA: Diagnosis present

## 2016-09-28 DIAGNOSIS — J9621 Acute and chronic respiratory failure with hypoxia: Secondary | ICD-10-CM | POA: Diagnosis present

## 2016-09-28 DIAGNOSIS — Z841 Family history of disorders of kidney and ureter: Secondary | ICD-10-CM

## 2016-09-28 DIAGNOSIS — J9602 Acute respiratory failure with hypercapnia: Secondary | ICD-10-CM

## 2016-09-28 DIAGNOSIS — Z7984 Long term (current) use of oral hypoglycemic drugs: Secondary | ICD-10-CM

## 2016-09-28 LAB — CBC WITH DIFFERENTIAL/PLATELET
BASOS ABS: 0 10*3/uL (ref 0.0–0.1)
Basophils Relative: 0 %
EOS PCT: 7 %
Eosinophils Absolute: 0.4 10*3/uL (ref 0.0–0.7)
HEMATOCRIT: 42.2 % (ref 36.0–46.0)
Hemoglobin: 13.3 g/dL (ref 12.0–15.0)
LYMPHS PCT: 29 %
Lymphs Abs: 1.7 10*3/uL (ref 0.7–4.0)
MCH: 27.8 pg (ref 26.0–34.0)
MCHC: 31.5 g/dL (ref 30.0–36.0)
MCV: 88.1 fL (ref 78.0–100.0)
MONO ABS: 0.6 10*3/uL (ref 0.1–1.0)
MONOS PCT: 11 %
NEUTROS ABS: 3.1 10*3/uL (ref 1.7–7.7)
Neutrophils Relative %: 53 %
PLATELETS: 163 10*3/uL (ref 150–400)
RBC: 4.79 MIL/uL (ref 3.87–5.11)
RDW: 14.1 % (ref 11.5–15.5)
WBC: 5.8 10*3/uL (ref 4.0–10.5)

## 2016-09-28 LAB — I-STAT CG4 LACTIC ACID, ED: Lactic Acid, Venous: 0.94 mmol/L (ref 0.5–1.9)

## 2016-09-28 MED ORDER — IPRATROPIUM BROMIDE 0.02 % IN SOLN
0.5000 mg | Freq: Once | RESPIRATORY_TRACT | Status: AC
  Administered 2016-09-28: 0.5 mg via RESPIRATORY_TRACT
  Filled 2016-09-28: qty 2.5

## 2016-09-28 MED ORDER — ALBUTEROL (5 MG/ML) CONTINUOUS INHALATION SOLN
10.0000 mg/h | INHALATION_SOLUTION | Freq: Once | RESPIRATORY_TRACT | Status: AC
  Administered 2016-09-28: 10 mg/h via RESPIRATORY_TRACT
  Filled 2016-09-28: qty 20

## 2016-09-28 NOTE — ED Provider Notes (Signed)
[facility] DEPT Provider Note   CSN: [id number] Arrival date & time: 09/28/16  2313  By signing my name below, I, Hilbert Odor, attest that this documentation has been prepared under the direction and in the presence of Rolland Porter, MD. Electronically Signed: Hilbert Odor, Scribe. 09/29/16. 12:39 AM.  Time seen 23:37 PM  History   Chief Complaint Chief Complaint  Patient presents with  . Respiratory Distress   LEVEL 5 CAVEAT FOR RESPIRATORY DISTRESS  The history is provided by the patient. No language interpreter was used.   HPI Comments: Sonya Oliver is a 76 y.o. female brought in by ambulance, who presents to the Emergency Department with severe respiratory distress that began tonight. She reports SOB for the past 3 days. She also reports chest tightness, rhinorrhea and sneezing. She denies cough, fever, sore throat, chest pain.  She states that she was admitted for a similar problem around 3 or 4 months ago. She currently uses nebulizers and inhalers at home, but reports they are not working today and that she is running low.  She is on 2 lpm [place] of oxygent at home. EMS reports her pulse ox was 80% on her oxygen at home and gave her a nebulizer treatment (total 7.5 mg albuterol and 0.5 mg atrovent) and IV solumedrol. . She currently lives with her son and daughter-in-law. She reports that she has taken the flu shot this year. She denies being around anyone that has been sick.   PCP MILLER, Lillette Boxer, MD   Past Medical History:  Diagnosis Date  . Arthritis   . Bladder tumor   . Cancer ([place])    bladder  . Cataract   . CHF (congestive heart failure) ([place])    NO CARDIOLOGIST---  MONITORED BY PCP DR Jacelyn Grip  . COPD with emphysema ([place])    last Acute Exacerbation 08-21-2014  . Dyspnea on exertion   . H/O pulmonary edema    jan 2014--  acute CHF w/ pulmonary edema  . Hypertension   . Hyperthyroidism    currently no meds -- labs stable  . Nocturia   . Nocturnal oxygen  desaturation    uses O2  HS via [place]--  and PRN daytime  . Osteoporosis   . Stroke ([place])   . Type 2 diabetes mellitus ([place])   . Ureteral tumor     Patient Active Problem List   Diagnosis Date Noted  . Respiratory failure with hypoxia and hypercapnia ([place]) 09/29/2016  . History of smoking 25-50 pack years 07/20/2016  . Aortic atherosclerosis ([place]) 07/20/2016  . Osteoporosis 07/08/2016  . COPD exacerbation ([place]) 02/05/2016  . Hypoxia 12/14/2015  . Diabetes mellitus, type 2 ([place]) 12/14/2015  . Lobar pneumonia due to unspecified organism 12/14/2015  . Acute respiratory failure with hypoxia ([place]) 12/14/2015  . Acute respiratory failure with hypercapnia ([place]) 12/14/2015  . Hypokalemia 08/22/2013  . Acute on chronic diastolic heart failure ([place]) 06/20/2013  . Acute-on-chronic respiratory failure ([place]) 06/20/2013  . CHF (congestive heart failure) ([place]) 12/19/2012  . COPD (chronic obstructive pulmonary disease) ([place]) 12/19/2012  . Unspecified vitamin D deficiency 12/19/2012  . HTN (hypertension) 12/19/2012  . SOB (shortness of breath) 09/12/2012  . Edema leg 09/12/2012  . Diabetes ([place]) 09/12/2012  . Anemia 03/21/2012  . Syncope 03/21/2012  . Hematuria, gross 02/23/2012  . Anemia associated with acute blood loss 02/23/2012  . Orthostatic hypotension 02/23/2012  . Bladder mass 02/23/2012  . Hyperthyroidism 02/23/2012    Past Surgical History:  Procedure Laterality  Date  . ABDOMINAL HYSTERECTOMY  1980'S   W/ BILATERAL SALPINGO-OOPHORECTOMY  . CATARACT EXTRACTION W/PHACO Left 01/31/2014   Procedure: CATARACT EXTRACTION PHACO AND INTRAOCULAR LENS PLACEMENT (IOC);  Surgeon: Tonny Branch, MD;  Location: [facility];  Service: Ophthalmology;  Laterality: Left;  CDE: 23.78  . CATARACT EXTRACTION W/PHACO Right 02/28/2014   Procedure: CATARACT EXTRACTION PHACO AND INTRAOCULAR LENS PLACEMENT (IOC);  Surgeon: Tonny Branch, MD;  Location: [facility];  Service: Ophthalmology;  Laterality: Right;  CDE:  11.34  .  CYSTOSCOPY  03/23/2012   Procedure: CYSTOSCOPY;  Surgeon: Claybon Jabs, MD;  Location: [facility];  Service: Urology;  Laterality: N/A;  . CYSTOSCOPY WITH RETROGRADE PYELOGRAM, URETEROSCOPY AND STENT PLACEMENT Right 10/21/2014   Procedure: CYSTOSCOPY WITH RETROGRADE PYELOGRAM, URETEROSCOPY, BLADDER TUMOR BIOPSY;  Surgeon: Claybon Jabs, MD;  Location: [facility];  Service: Urology;  Laterality: Right;  . TRANSTHORACIC ECHOCARDIOGRAM  06-21-2013   moderate LVH,  grade I diastolic dysfunction, ef 53-97%,  mild MR & TR  . TRANSURETHRAL RESECTION OF BLADDER TUMOR  03/23/2012   Procedure: TRANSURETHRAL RESECTION OF BLADDER TUMOR (TURBT);  Surgeon: Claybon Jabs, MD;  Location: [facility];  Service: Urology;  Laterality: N/A;  . TRANSURETHRAL RESECTION OF BLADDER TUMOR N/A 01/08/2013   Procedure: TRANSURETHRAL RESECTION OF BLADDER TUMOR (TURBT);  Surgeon: Claybon Jabs, MD;  Location: [facility];  Service: Urology;  Laterality: N/A;    OB History    No data available       Home Medications    Prior to Admission medications   Medication Sig Start Date End Date Taking? Authorizing Provider  albuterol (PROVENTIL HFA;VENTOLIN HFA) 108 (90 Base) MCG/ACT inhaler Inhale 1-2 puffs into the lungs every 6 (six) hours as needed for wheezing or shortness of breath. 04/07/16   Wardell Honour, MD  alendronate (FOSAMAX) 70 MG tablet Take 1 tablet (70 mg total) by mouth once a week. Take with a full glass of water on an empty stomach. 07/08/16   Cherre Robins, PharmD  carvedilol (COREG) 3.125 MG tablet TAKE 1 TABLET BY MOUTH TWICE DAILY 05/10/16   Wardell Honour, MD  Cholecalciferol (VITAMIN D3) 2000 UNITS TABS Take 1 tablet by mouth daily.    Historical Provider, MD  fluticasone furoate-vilanterol (BREO ELLIPTA) 100-25 MCG/INH AEPB Inhale 1 puff into the lungs daily. 02/27/16   Wardell Honour, MD  furosemide (LASIX) 40 MG tablet TAKE 1 TABLET BY MOUTH ONCE DAILY 06/28/16   Wardell Honour, MD  guaiFENesin (MUCINEX) 600 MG 12 hr tablet Take 1 tablet (600 mg total) by mouth 2 (two) times daily. 12/18/15   Kathie Dike, MD  INCRUSE ELLIPTA 62.5 MCG/INH AEPB Take 1 puff by mouth daily. 05/06/16   Historical Provider, MD  KLOR-CON M20 20 MEQ tablet TAKE 1 TABLET (20 MEQ TOTAL) BY MOUTH DAILY. 06/07/16   Wardell Honour, MD  metFORMIN (GLUCOPHAGE) 500 MG tablet Take 1 tablet (500 mg total) by mouth daily. 06/28/16   Wardell Honour, MD  Prenatal Vit-Fe Fumarate-FA (M-VIT) tablet Take 1 tablet by mouth daily.    Historical Provider, MD  RESTASIS MULTIDOSE 0.05 % ophthalmic emulsion Place 1 drop into both eyes 2 (two) times daily. 01/19/16   Historical Provider, MD    Family History Family History  Problem Relation Age of Onset  . Diabetes Father   . Congestive Heart Failure Father   . COPD Father   . Kidney disease Father   . Hypertension  Father   . Diabetes Mother     with kidney disease  . Kidney disease Mother   . Heart attack Brother   . Asthma Brother   . Diabetes Daughter   . Diabetes Son   . Cancer Brother     throat  . Hypertension Brother     Social History Social History  Substance Use Topics  . Smoking status: Former Smoker    Packs/day: 1.00    Years: 50.00    Types: Cigarettes    Quit date: 09/24/2006  . Smokeless tobacco: Never Used  . Alcohol use No  lives at home Lives with son and DIL   Allergies   Patient has no known allergies.   Review of Systems Review of Systems  Constitutional: Negative for fever.  HENT: Positive for sneezing. Negative for rhinorrhea and sore throat.   Respiratory: Positive for chest tightness and shortness of breath. Negative for cough.   All other systems reviewed and are negative.    Physical Exam Updated Vital Signs BP (!) 170/139   Pulse 93   Temp 97.6 F (36.4 C)   Resp (!) 34   Ht '5\' 6"'$  (1.676 m)   Wt 184 lb (83.5 kg)   SpO2 98%   BMI 29.70 kg/m   Vital signs normal except for tachypnea  and hypertension   Physical Exam  Constitutional: She is oriented to person, place, and time. She appears well-developed and well-nourished. She appears distressed.  HENT:  Head: Normocephalic and atraumatic.  Right Ear: External ear normal.  Left Ear: External ear normal.  Nose: Nose normal. No mucosal edema or rhinorrhea.  Mouth/Throat: Oropharynx is clear and moist and mucous membranes are normal. No dental abscesses or uvula swelling.  Eyes: Conjunctivae and EOM are normal. Pupils are equal, round, and reactive to light.  Neck: Normal range of motion and full passive range of motion without pain. Neck supple.  Cardiovascular: Normal rate, regular rhythm and normal heart sounds.  Exam reveals no gallop and no friction rub.   No murmur heard. Pulmonary/Chest: Accessory muscle usage present. Tachypnea noted. No respiratory distress. She has decreased breath sounds. She has wheezes (scattered). She has no rhonchi. She has no rales. She exhibits no tenderness and no crepitus.  She can only talk in 1 or 2 word sentences due to shortness of breath.  Abdominal: Soft. Normal appearance and bowel sounds are normal. She exhibits no distension. There is no tenderness. There is no rebound and no guarding.  Musculoskeletal: Normal range of motion. She exhibits no edema or tenderness.  Moves all extremities well.   Neurological: She is alert and oriented to person, place, and time. She has normal strength. No cranial nerve deficit.  Skin: Skin is warm, dry and intact. No rash noted. No erythema. No pallor.  Psychiatric: She has a normal mood and affect. Her speech is normal and behavior is normal. Her mood appears not anxious.  Nursing note and vitals reviewed.    ED Treatments / Results  Labs (all labs ordered are listed, but only abnormal results are displayed) Results for orders placed or performed during the hospital encounter of 09/28/16  CBC with Differential  Result Value Ref Range   WBC  5.8 4.0 - 10.5 K/uL   RBC 4.79 3.87 - 5.11 MIL/uL   Hemoglobin 13.3 12.0 - 15.0 g/dL   HCT 42.2 36.0 - 46.0 %   MCV 88.1 78.0 - 100.0 fL   MCH 27.8 26.0 - 34.0 pg  MCHC 31.5 30.0 - 36.0 g/dL   RDW 14.1 11.5 - 15.5 %   Platelets 163 150 - 400 K/uL   Neutrophils Relative % 53 %   Neutro Abs 3.1 1.7 - 7.7 K/uL   Lymphocytes Relative 29 %   Lymphs Abs 1.7 0.7 - 4.0 K/uL   Monocytes Relative 11 %   Monocytes Absolute 0.6 0.1 - 1.0 K/uL   Eosinophils Relative 7 %   Eosinophils Absolute 0.4 0.0 - 0.7 K/uL   Basophils Relative 0 %   Basophils Absolute 0.0 0.0 - 0.1 K/uL  Brain natriuretic peptide  Result Value Ref Range   B Natriuretic Peptide 56.0 0.0 - 100.0 pg/mL  Troponin I  Result Value Ref Range   Troponin I <0.03 <0.03 ng/mL  Comprehensive metabolic panel  Result Value Ref Range   Sodium 142 135 - 145 mmol/L   Potassium 3.6 3.5 - 5.1 mmol/L   Chloride 99 (L) 101 - 111 mmol/L   CO2 32 22 - 32 mmol/L   Glucose, Bld 112 (H) 65 - 99 mg/dL   BUN 13 6 - 20 mg/dL   Creatinine, Ser 0.73 0.44 - 1.00 mg/dL   Calcium 9.2 8.9 - 10.3 mg/dL   Total Protein 8.7 (H) 6.5 - 8.1 g/dL   Albumin 4.1 3.5 - 5.0 g/dL   AST 18 15 - 41 U/L   ALT 14 14 - 54 U/L   Alkaline Phosphatase 49 38 - 126 U/L   Total Bilirubin 1.0 0.3 - 1.2 mg/dL   GFR calc non Af Amer >60 >60 mL/min   GFR calc Af Amer >60 >60 mL/min   Anion gap 11 5 - 15  Blood gas, arterial ([facility] ONLY)  Result Value Ref Range   O2 Content 3.0 L/min   Delivery systems NASAL CANNULA    pH, Arterial 7.275 (L) 7.350 - 7.450   pCO2 arterial 69.1 (HH) 32.0 - 48.0 mmHg   pO2, Arterial 83.2 83.0 - 108.0 mmHg   Bicarbonate 26.6 20.0 - 28.0 mmol/L   Acid-Base Excess 4.7 (H) 0.0 - 2.0 mmol/L   O2 Saturation 94.7 %   Patient temperature 37.0    Collection site RIGHT RADIAL    Drawn by [id number]    Sample type ARTERIAL    Allens test (pass/fail) PASS PASS  Blood gas, arterial  Result Value Ref Range   FIO2 40.00    Delivery systems  BILEVEL POSITIVE AIRWAY PRESSURE    Inspiratory PAP 14.0    Expiratory PAP 6.0    pH, Arterial 7.343 (L) 7.350 - 7.450   pCO2 arterial 55.4 (H) 32.0 - 48.0 mmHg   pO2, Arterial 69.3 (L) 83.0 - 108.0 mmHg   Bicarbonate 26.9 20.0 - 28.0 mmol/L   Acid-Base Excess 4.0 (H) 0.0 - 2.0 mmol/L   O2 Saturation 92.9 %   Patient temperature 37.0    Collection site RIGHT RADIAL    Drawn by [phone]    Sample type ARTERIAL    Allens test (pass/fail) PASS PASS  I-Stat CG4 Lactic Acid, ED  Result Value Ref Range   Lactic Acid, Venous 0.94 0.5 - 1.9 mmol/L   Laboratory interpretation all normal except ABG #1 acute respiratory acidosis, ABG # 2 improving respiratory acidosis    EKG  EKG Interpretation  Date/Time:  Tuesday September 28 2016 23:25:28 EST Ventricular Rate:  99 PR Interval:    QRS Duration: 103 QT Interval:  368 QTC Calculation: 473 R Axis:   90 Text Interpretation:  Sinus tachycardia Multiple premature complexes, vent & supraven Borderline right axis deviation Electrode noise Baseline wander leads aVR,aVL and aVF Since last tracing rate faster 05 Feb 2016 Confirmed by Nicolena Schurman  MD-I, Marcia Hartwell ([id number]) on 09/29/2016 12:07:26 AM       Radiology Dg Chest Portable 1 View  Result Date: 09/28/2016 CLINICAL DATA:  Worsening dyspnea for 2 or 3 days. EXAM: PORTABLE CHEST 1 VIEW COMPARISON:  02/05/2016 FINDINGS: Hyperinflation. Mild chronic coarsening of the basilar interstitial markings. No confluent consolidation. No effusions. No pneumothorax. Hilar and mediastinal contours are unremarkable and unchanged. IMPRESSION: COPD changes.  No consolidation or effusion. Electronically Signed   By: Andreas Newport M.D.   On: 09/28/2016 23:53    Procedures Procedures (including critical care time)  CRITICAL CARE Performed by: Braidan Ricciardi L Dasani Thurlow Total critical care time: 53 minutes Critical care time was exclusive of separately billable procedures and treating other patients. Critical care was necessary to  treat or prevent imminent or life-threatening deterioration. Critical care was time spent personally by me on the following activities: development of treatment plan with patient and/or surrogate as well as nursing, discussions with consultants, evaluation of patient's response to treatment, examination of patient, obtaining history from patient or surrogate, ordering and performing treatments and interventions, ordering and review of laboratory studies, ordering and review of radiographic studies, pulse oximetry and re-evaluation of patient's condition.   Medications Ordered in ED Medications  ipratropium (ATROVENT) nebulizer solution 0.5 mg (0.5 mg Nebulization Given 09/28/16 2351)  albuterol (PROVENTIL,VENTOLIN) solution continuous neb (10 mg/hr Nebulization Given 09/28/16 2351)  ipratropium-albuterol (DUONEB) 0.5-2.5 (3) MG/3ML nebulizer solution 3 mL (3 mLs Nebulization Given 09/29/16 0113)  albuterol (PROVENTIL) (2.5 MG/3ML) 0.083% nebulizer solution 2.5 mg (2.5 mg Nebulization Given 09/29/16 0113)  albuterol (PROVENTIL,VENTOLIN) solution continuous neb (10 mg/hr Nebulization Given 09/29/16 0406)  ipratropium (ATROVENT) nebulizer solution 0.5 mg (0.5 mg Nebulization Given 09/29/16 0406)     Initial Impression / Assessment and Plan / ED Course  DIAGNOSTIC STUDIES: Oxygen Saturation is 98% on RA, normal by my interpretation.    COORDINATION OF CARE: 11:40 PM Discussed treatment plan with pt at bedside, which includes CXR, EKG, and labs, and pt agreed to plan.  Pt was started on a continuous nebulizer.  12:41 AM  rechecked: she is at the end of her continuous nebulizer. She still has diffused wheezing. She was given a albuterol/atrovent nebulizer. ABG was ordered.  01:47 AM After reviewing her ABG, pt was started on Bipap.  PT indicates she is feeling better on the Bipap. She still has diminished BS and scattered wheezing.   03:10 AM repeat ABG done after being on Bipap for 1 hour and shows  improving respiratory acidosis. At this point patient felt to be stable to be admitted on Bipap.   03:50 AM Dr Shanon Brow, hospitalist, admit to step down.    I have reviewed the triage vital signs and the nursing notes.  Pertinent labs & imaging results that were available during my care of the patient were reviewed by me and considered in my medical decision making (see chart for details).      Final Clinical Impressions(s) / ED Diagnoses   Final diagnoses:  COPD exacerbation ([place])  Acute respiratory failure with hypoxia and hypercapnia ([place])    Plan admission  Rolland Porter, MD, FACEP   I personally performed the services described in this documentation, which was scribed in my presence. The recorded information has been reviewed and considered.  Rolland Porter, MD, Abram Sander  Rolland Porter, MD 09/29/16 [phone]

## 2016-09-28 NOTE — ED Triage Notes (Signed)
Pt c/o sob for the past three days, becoming worse tonight, upon ems arrival to residence, pulse ox was 80% on 2lpm via [place] which pt wears at home, pt received one 5.0 mg abuterol treatment, one duoneb and 125 mg solumedrol enroute with ems with little improvement in symptoms, pt arrived to er, unable to speak but very few words due to resp distress, denies any pain,

## 2016-09-28 NOTE — ED Notes (Signed)
Respiratory paged

## 2016-09-29 ENCOUNTER — Inpatient Hospital Stay (HOSPITAL_COMMUNITY): Payer: Medicare Other

## 2016-09-29 DIAGNOSIS — Z961 Presence of intraocular lens: Secondary | ICD-10-CM | POA: Diagnosis present

## 2016-09-29 DIAGNOSIS — J9602 Acute respiratory failure with hypercapnia: Secondary | ICD-10-CM

## 2016-09-29 DIAGNOSIS — M810 Age-related osteoporosis without current pathological fracture: Secondary | ICD-10-CM | POA: Diagnosis present

## 2016-09-29 DIAGNOSIS — Z7984 Long term (current) use of oral hypoglycemic drugs: Secondary | ICD-10-CM | POA: Diagnosis not present

## 2016-09-29 DIAGNOSIS — R0902 Hypoxemia: Secondary | ICD-10-CM

## 2016-09-29 DIAGNOSIS — Z8551 Personal history of malignant neoplasm of bladder: Secondary | ICD-10-CM | POA: Diagnosis not present

## 2016-09-29 DIAGNOSIS — J9620 Acute and chronic respiratory failure, unspecified whether with hypoxia or hypercapnia: Secondary | ICD-10-CM

## 2016-09-29 DIAGNOSIS — Z8673 Personal history of transient ischemic attack (TIA), and cerebral infarction without residual deficits: Secondary | ICD-10-CM | POA: Diagnosis not present

## 2016-09-29 DIAGNOSIS — J9692 Respiratory failure, unspecified with hypercapnia: Secondary | ICD-10-CM

## 2016-09-29 DIAGNOSIS — R0989 Other specified symptoms and signs involving the circulatory and respiratory systems: Secondary | ICD-10-CM

## 2016-09-29 DIAGNOSIS — Z8249 Family history of ischemic heart disease and other diseases of the circulatory system: Secondary | ICD-10-CM | POA: Diagnosis not present

## 2016-09-29 DIAGNOSIS — Z7983 Long term (current) use of bisphosphonates: Secondary | ICD-10-CM | POA: Diagnosis not present

## 2016-09-29 DIAGNOSIS — I110 Hypertensive heart disease with heart failure: Secondary | ICD-10-CM | POA: Diagnosis present

## 2016-09-29 DIAGNOSIS — Z66 Do not resuscitate: Secondary | ICD-10-CM | POA: Diagnosis present

## 2016-09-29 DIAGNOSIS — E0590 Thyrotoxicosis, unspecified without thyrotoxic crisis or storm: Secondary | ICD-10-CM | POA: Diagnosis present

## 2016-09-29 DIAGNOSIS — M1990 Unspecified osteoarthritis, unspecified site: Secondary | ICD-10-CM | POA: Diagnosis present

## 2016-09-29 DIAGNOSIS — I5032 Chronic diastolic (congestive) heart failure: Secondary | ICD-10-CM

## 2016-09-29 DIAGNOSIS — R0609 Other forms of dyspnea: Secondary | ICD-10-CM

## 2016-09-29 DIAGNOSIS — J441 Chronic obstructive pulmonary disease with (acute) exacerbation: Secondary | ICD-10-CM | POA: Diagnosis present

## 2016-09-29 DIAGNOSIS — I509 Heart failure, unspecified: Secondary | ICD-10-CM

## 2016-09-29 DIAGNOSIS — J9621 Acute and chronic respiratory failure with hypoxia: Secondary | ICD-10-CM

## 2016-09-29 DIAGNOSIS — Z833 Family history of diabetes mellitus: Secondary | ICD-10-CM | POA: Diagnosis not present

## 2016-09-29 DIAGNOSIS — J9622 Acute and chronic respiratory failure with hypercapnia: Secondary | ICD-10-CM

## 2016-09-29 DIAGNOSIS — R0602 Shortness of breath: Secondary | ICD-10-CM | POA: Diagnosis not present

## 2016-09-29 DIAGNOSIS — Z841 Family history of disorders of kidney and ureter: Secondary | ICD-10-CM | POA: Diagnosis not present

## 2016-09-29 DIAGNOSIS — I10 Essential (primary) hypertension: Secondary | ICD-10-CM

## 2016-09-29 DIAGNOSIS — R351 Nocturia: Secondary | ICD-10-CM | POA: Diagnosis present

## 2016-09-29 DIAGNOSIS — E119 Type 2 diabetes mellitus without complications: Secondary | ICD-10-CM | POA: Diagnosis present

## 2016-09-29 DIAGNOSIS — Z79899 Other long term (current) drug therapy: Secondary | ICD-10-CM | POA: Diagnosis not present

## 2016-09-29 DIAGNOSIS — J9691 Respiratory failure, unspecified with hypoxia: Secondary | ICD-10-CM | POA: Diagnosis present

## 2016-09-29 DIAGNOSIS — J9601 Acute respiratory failure with hypoxia: Secondary | ICD-10-CM

## 2016-09-29 DIAGNOSIS — Z9981 Dependence on supplemental oxygen: Secondary | ICD-10-CM | POA: Diagnosis not present

## 2016-09-29 DIAGNOSIS — Z9842 Cataract extraction status, left eye: Secondary | ICD-10-CM | POA: Diagnosis not present

## 2016-09-29 LAB — BLOOD GAS, ARTERIAL
ACID-BASE EXCESS: 4.7 mmol/L — AB (ref 0.0–2.0)
Acid-Base Excess: 4 mmol/L — ABNORMAL HIGH (ref 0.0–2.0)
Acid-Base Excess: 4.5 mmol/L — ABNORMAL HIGH (ref 0.0–2.0)
Allens test (pass/fail): POSITIVE — AB
BICARBONATE: 26.6 mmol/L (ref 20.0–28.0)
Bicarbonate: 26.9 mmol/L (ref 20.0–28.0)
Bicarbonate: 27.5 mmol/L (ref 20.0–28.0)
DELIVERY SYSTEMS: POSITIVE
DELIVERY SYSTEMS: POSITIVE
DRAWN BY: 382351
Drawn by: 213101
Drawn by: 38235
EXPIRATORY PAP: 6
Expiratory PAP: 6
FIO2: 40
FIO2: 40
INSPIRATORY PAP: 14
INSPIRATORY PAP: 14
LHR: 8 {breaths}/min
O2 CONTENT: 3 L/min
O2 SAT: 94.7 %
O2 SAT: 93.7 %
O2 Saturation: 92.9 %
PATIENT TEMPERATURE: 37
PCO2 ART: 55.4 mmHg — AB (ref 32.0–48.0)
PH ART: 7.343 — AB (ref 7.350–7.450)
PH ART: 7.363 (ref 7.350–7.450)
Patient temperature: 37
Patient temperature: 37
pCO2 arterial: 69.1 mmHg (ref 32.0–48.0)
pCO2 arterial: 53.3 mmHg — ABNORMAL HIGH (ref 32.0–48.0)
pH, Arterial: 7.275 — ABNORMAL LOW (ref 7.350–7.450)
pO2, Arterial: 83.2 mmHg (ref 83.0–108.0)
pO2, Arterial: 69.3 mmHg — ABNORMAL LOW (ref 83.0–108.0)
pO2, Arterial: 71.2 mmHg — ABNORMAL LOW (ref 83.0–108.0)

## 2016-09-29 LAB — GLUCOSE, CAPILLARY
GLUCOSE-CAPILLARY: 287 mg/dL — AB (ref 65–99)
Glucose-Capillary: 166 mg/dL — ABNORMAL HIGH (ref 65–99)
Glucose-Capillary: 108 mg/dL — ABNORMAL HIGH (ref 65–99)
Glucose-Capillary: 108 mg/dL — ABNORMAL HIGH (ref 65–99)

## 2016-09-29 LAB — RESPIRATORY PANEL BY PCR
ADENOVIRUS-RVPPCR: NOT DETECTED
Bordetella pertussis: NOT DETECTED
CHLAMYDOPHILA PNEUMONIAE-RVPPCR: NOT DETECTED
CORONAVIRUS NL63-RVPPCR: NOT DETECTED
CORONAVIRUS OC43-RVPPCR: NOT DETECTED
Coronavirus 229E: NOT DETECTED
Coronavirus HKU1: NOT DETECTED
INFLUENZA A-RVPPCR: NOT DETECTED
INFLUENZA B-RVPPCR: NOT DETECTED
MYCOPLASMA PNEUMONIAE-RVPPCR: NOT DETECTED
Metapneumovirus: NOT DETECTED
PARAINFLUENZA VIRUS 3-RVPPCR: NOT DETECTED
PARAINFLUENZA VIRUS 4-RVPPCR: NOT DETECTED
Parainfluenza Virus 1: NOT DETECTED
Parainfluenza Virus 2: NOT DETECTED
RESPIRATORY SYNCYTIAL VIRUS-RVPPCR: NOT DETECTED
RHINOVIRUS / ENTEROVIRUS - RVPPCR: NOT DETECTED

## 2016-09-29 LAB — COMPREHENSIVE METABOLIC PANEL
ALK PHOS: 49 U/L (ref 38–126)
ALT: 14 U/L (ref 14–54)
ANION GAP: 11 (ref 5–15)
AST: 18 U/L (ref 15–41)
Albumin: 4.1 g/dL (ref 3.5–5.0)
BUN: 13 mg/dL (ref 6–20)
CALCIUM: 9.2 mg/dL (ref 8.9–10.3)
CO2: 32 mmol/L (ref 22–32)
Chloride: 99 mmol/L — ABNORMAL LOW (ref 101–111)
Creatinine, Ser: 0.73 mg/dL (ref 0.44–1.00)
GFR calc non Af Amer: >60 mL/min (ref >60)
Glucose, Bld: 112 mg/dL — ABNORMAL HIGH (ref 65–99)
Potassium: 3.6 mmol/L (ref 3.5–5.1)
SODIUM: 142 mmol/L (ref 135–145)
TOTAL PROTEIN: 8.7 g/dL — AB (ref 6.5–8.1)
Total Bilirubin: 1 mg/dL (ref 0.3–1.2)

## 2016-09-29 LAB — BRAIN NATRIURETIC PEPTIDE: B Natriuretic Peptide: 56 pg/mL (ref 0.0–100.0)

## 2016-09-29 LAB — MRSA PCR SCREENING: MRSA BY PCR: NEGATIVE

## 2016-09-29 LAB — TROPONIN I: Troponin I: <0.03 ng/mL (ref <0.03)

## 2016-09-29 MED ORDER — SODIUM CHLORIDE 0.9 % IV SOLN: 250.0000 mL | INTRAVENOUS | Status: DC | PRN

## 2016-09-29 MED ORDER — AZITHROMYCIN 250 MG PO TABS
250.0000 mg | ORAL_TABLET | Freq: Every day | ORAL | Status: DC
  Administered 2016-09-30 – 2016-10-02 (×3): 250 mg via ORAL
  Filled 2016-09-29 (×3): qty 1

## 2016-09-29 MED ORDER — GUAIFENESIN ER 600 MG PO TB12
600.0000 mg | ORAL_TABLET | Freq: Two times a day (BID) | ORAL | Status: DC
  Administered 2016-09-29 – 2016-10-02 (×7): 600 mg via ORAL
  Filled 2016-09-29 (×7): qty 1

## 2016-09-29 MED ORDER — IPRATROPIUM BROMIDE 0.02 % IN SOLN: 0.5000 mg | Freq: Once | RESPIRATORY_TRACT | Status: DC

## 2016-09-29 MED ORDER — INSULIN ASPART 100 UNIT/ML ~~LOC~~ SOLN: 0.0000 [IU] | Freq: Three times a day (TID) | SUBCUTANEOUS | Status: DC

## 2016-09-29 MED ORDER — IPRATROPIUM-ALBUTEROL 0.5-2.5 (3) MG/3ML IN SOLN
3.0000 mL | Freq: Once | RESPIRATORY_TRACT | Status: AC
Start: 2016-09-29 — End: 2016-09-29
  Administered 2016-09-29: 3 mL via RESPIRATORY_TRACT
  Filled 2016-09-29: qty 3

## 2016-09-29 MED ORDER — METHYLPREDNISOLONE SODIUM SUCC 125 MG IJ SOLR
80.0000 mg | Freq: Two times a day (BID) | INTRAMUSCULAR | Status: DC
  Administered 2016-09-29 – 2016-10-01 (×5): 80 mg via INTRAVENOUS
  Filled 2016-09-29 (×5): qty 2

## 2016-09-29 MED ORDER — CHLORHEXIDINE GLUCONATE 0.12 % MT SOLN
15.0000 mL | Freq: Two times a day (BID) | OROMUCOSAL | Status: DC
  Administered 2016-09-29 – 2016-10-02 (×6): 15 mL via OROMUCOSAL
  Filled 2016-09-29 (×5): qty 15

## 2016-09-29 MED ORDER — ORAL CARE MOUTH RINSE
15.0000 mL | Freq: Two times a day (BID) | OROMUCOSAL | Status: DC
  Administered 2016-10-01: 15 mL via OROMUCOSAL

## 2016-09-29 MED ORDER — ENOXAPARIN SODIUM 40 MG/0.4ML ~~LOC~~ SOLN
40.0000 mg | SUBCUTANEOUS | Status: DC
  Administered 2016-09-29 – 2016-10-02 (×4): 40 mg via SUBCUTANEOUS
  Filled 2016-09-29 (×4): qty 0.4

## 2016-09-29 MED ORDER — IPRATROPIUM BROMIDE 0.02 % IN SOLN
0.5000 mg | Freq: Once | RESPIRATORY_TRACT | Status: AC
  Administered 2016-09-29: 0.5 mg via RESPIRATORY_TRACT
  Filled 2016-09-29: qty 2.5

## 2016-09-29 MED ORDER — ALBUTEROL SULFATE (2.5 MG/3ML) 0.083% IN NEBU: 2.5000 mg | INHALATION_SOLUTION | Freq: Four times a day (QID) | RESPIRATORY_TRACT | Status: DC

## 2016-09-29 MED ORDER — SODIUM CHLORIDE 0.9% FLUSH
3.0000 mL | Freq: Two times a day (BID) | INTRAVENOUS | Status: DC
  Administered 2016-09-29 – 2016-10-01 (×5): 3 mL via INTRAVENOUS

## 2016-09-29 MED ORDER — IPRATROPIUM BROMIDE 0.02 % IN SOLN: 0.5000 mg | Freq: Four times a day (QID) | RESPIRATORY_TRACT | Status: DC

## 2016-09-29 MED ORDER — AZITHROMYCIN 250 MG PO TABS
500.0000 mg | ORAL_TABLET | Freq: Every day | ORAL | Status: AC
  Administered 2016-09-29: 500 mg via ORAL
  Filled 2016-09-29: qty 2

## 2016-09-29 MED ORDER — BUDESONIDE 0.25 MG/2ML IN SUSP
0.2500 mg | Freq: Two times a day (BID) | RESPIRATORY_TRACT | Status: DC
  Administered 2016-09-29 (×2): 0.25 mg via RESPIRATORY_TRACT
  Filled 2016-09-29 (×2): qty 2

## 2016-09-29 MED ORDER — ALBUTEROL SULFATE (2.5 MG/3ML) 0.083% IN NEBU
2.5000 mg | INHALATION_SOLUTION | Freq: Once | RESPIRATORY_TRACT | Status: AC
  Administered 2016-09-29: 2.5 mg via RESPIRATORY_TRACT
  Filled 2016-09-29: qty 3

## 2016-09-29 MED ORDER — SODIUM CHLORIDE 0.9% FLUSH: 3.0000 mL | INTRAVENOUS | Status: DC | PRN

## 2016-09-29 MED ORDER — CARVEDILOL 3.125 MG PO TABS
3.1250 mg | ORAL_TABLET | Freq: Two times a day (BID) | ORAL | Status: DC
  Administered 2016-09-29 – 2016-10-02 (×7): 3.125 mg via ORAL
  Filled 2016-09-29 (×8): qty 1

## 2016-09-29 MED ORDER — ALBUTEROL SULFATE (2.5 MG/3ML) 0.083% IN NEBU: 5.0000 mg | INHALATION_SOLUTION | Freq: Once | RESPIRATORY_TRACT | Status: DC

## 2016-09-29 MED ORDER — INSULIN ASPART 100 UNIT/ML ~~LOC~~ SOLN
0.0000 [IU] | Freq: Every day | SUBCUTANEOUS | Status: DC
  Administered 2016-09-29: 3 [IU] via SUBCUTANEOUS
  Administered 2016-10-01: 2 [IU] via SUBCUTANEOUS

## 2016-09-29 MED ORDER — IPRATROPIUM-ALBUTEROL 0.5-2.5 (3) MG/3ML IN SOLN
3.0000 mL | Freq: Four times a day (QID) | RESPIRATORY_TRACT | Status: DC
  Administered 2016-09-29 (×3): 3 mL via RESPIRATORY_TRACT
  Filled 2016-09-29 (×3): qty 3

## 2016-09-29 MED ORDER — INSULIN ASPART 100 UNIT/ML ~~LOC~~ SOLN
0.0000 [IU] | Freq: Three times a day (TID) | SUBCUTANEOUS | Status: DC
  Administered 2016-09-29 – 2016-09-30 (×2): 2 [IU] via SUBCUTANEOUS
  Administered 2016-09-30: 1 [IU] via SUBCUTANEOUS
  Administered 2016-09-30 – 2016-10-01 (×4): 2 [IU] via SUBCUTANEOUS

## 2016-09-29 MED ORDER — ALBUTEROL (5 MG/ML) CONTINUOUS INHALATION SOLN
10.0000 mg/h | INHALATION_SOLUTION | Freq: Once | RESPIRATORY_TRACT | Status: AC
  Administered 2016-09-29: 10 mg/h via RESPIRATORY_TRACT
  Filled 2016-09-29: qty 20

## 2016-09-29 MED ORDER — FUROSEMIDE 10 MG/ML IJ SOLN
20.0000 mg | Freq: Every day | INTRAMUSCULAR | Status: DC
  Administered 2016-09-29: 20 mg via INTRAVENOUS
  Filled 2016-09-29: qty 2

## 2016-09-29 MED ORDER — IPRATROPIUM-ALBUTEROL 0.5-2.5 (3) MG/3ML IN SOLN
3.0000 mL | Freq: Three times a day (TID) | RESPIRATORY_TRACT | Status: DC
  Administered 2016-09-30 – 2016-10-02 (×6): 3 mL via RESPIRATORY_TRACT
  Filled 2016-09-29 (×8): qty 3

## 2016-09-29 MED ORDER — ALBUTEROL SULFATE (2.5 MG/3ML) 0.083% IN NEBU
2.5000 mg | INHALATION_SOLUTION | RESPIRATORY_TRACT | Status: DC | PRN
Start: 2016-09-29 — End: 2016-10-02

## 2016-09-29 NOTE — Plan of Care (Signed)
Problem: Nutrition: Goal: Adequate nutrition will be maintained Outcome: Progressing Tolerated solid food for evening meal  Problem: Bowel/Gastric: Goal: Will not experience complications related to bowel motility Outcome: Progressing Had good bm today

## 2016-09-29 NOTE — Progress Notes (Signed)
**Note De-Identified  Obfuscation** Patient removed from BIPAP and placed on 4 L [place]; tolerating well.  RRT to continue to monitor

## 2016-09-29 NOTE — Plan of Care (Signed)
Problem: ICU Phase Progression Outcomes Goal: O2 sats trending toward baseline Outcome: Progressing Off bipap but still requires 5l o2 via [place] Goal: Dyspnea controlled at rest Outcome: Progressing Still gets oob w/ any exertion Goal: Voiding-avoid urinary catheter unless indicated Outcome: Progressing Voiding amber urine

## 2016-09-29 NOTE — ED Notes (Addendum)
Ph 7.275 pc02-69.1 p02-83.2 Bicarb-26.6 Sat-94.7

## 2016-09-29 NOTE — Progress Notes (Signed)
PROGRESS NOTE  Sonya Oliver LFY:[id number] DOB: 1940-10-17 DOA: 09/28/2016 PCP: Sonya Honour, MD  Brief History76  76 year old female with a history of COPD, chronic respiratory failure on 2 L at nighttime, diastolic CHF, hypertension, diabetes mellitus, stroke, bladder cancer presented with 3 day history of worsening shortness of breath and nonproductive cough. The patient lives with her son, but she states that she has not had any sick contacts. She denies any fevers, chills, nausea, vomiting, diarrhea. She has been using her albuterol inhaler home without improvement. She was found to be in respiratory distress and given albuterol and Solu-Medrol by EMS. She was placed on BiPAP in the emergency department secondary to moderate respiratory distress.  Assessment/Plan: Acute on chronic respiratory failure with hypoxia and hypercarbia -Secondary to COPD exacerbation -09/29/2016--ABG--7.34/55/69/26 on 0.4 -Wean BiPAP as tolerated -Pulmonary hygiene -viral respiratory panel  COPD exacerbation -Continue intravenous IV Medrol -Continue duo nebs -Start Pulmicort -Continue azithromycin  Chronic diastolic CHF -85/27/7824 echo EF 60-65%, grade 1 daily -Continue home dose furosemide--switch to IV while on BiPAP -Previous baseline weight 177-180 according to previous records  -daily weights -Continue carvedilol   Diabetes mellitus type 2 -Continue NovoLog sliding scale -Check hemoglobin A1c -hold metformin  Hypertension -Continue carvedilol -controlled  LE edema and pain -venous duplex   Disposition Plan:   Home in 2-3 days  Family Communication:   No Family at bedside  Consultants:  none  Code Status:  FULL  DVT Prophylaxis:   [name]   Procedures: As Listed in Progress Note Above  Antibiotics: None    Subjective:  patient is breathing better. She states that she quit smoking 5 years ago. Complains of nonproductive cough without hemoptysis.  Denies any fevers, chills, chest pain, nausea, vomiting, diarrhea, abdominal pain. No dysuria or hematuria.  Objective: Vitals:   09/29/16 0409 09/29/16 0430 09/29/16 0510 09/29/16 0600  BP:  128/67  (!) 123/56  Pulse:  82  84  Resp:  21  17  Temp:   98 F (36.7 C)   TempSrc:   Axillary   SpO2: 95% 100%  98%  Weight:   76.8 kg (169 lb 5 oz)   Height:   5' 6.5" (1.689 m)    No intake or output data in the 24 hours ending 09/29/16 0735 Weight change:  Exam:   General:  Pt is alert, follows commands appropriately, not in acute distress  HEENT: No icterus, No thrush, No neck mass, [name]  Cardiovascular: RRR, S1/S2, no rubs, no gallops  Respiratory: Diminished breath sounds bilaterally. Bibasilar crackles.   Abdomen: Soft/+BS, non tender, non distended, no guarding  Extremities: 1 + LE edema, No lymphangitis, No petechiae, No rashes, no synovitis   Data Reviewed: I have personally reviewed following labs and imaging studies Basic Metabolic Panel:  Recent Labs Lab 09/29/16 0015  NA 142  K 3.6  CL 99*  CO2 32  GLUCOSE 112*  BUN 13  CREATININE 0.73  CALCIUM 9.2   Liver Function Tests:  Recent Labs Lab 09/29/16 0015  AST 18  ALT 14  ALKPHOS 49  BILITOT 1.0  PROT 8.7*  ALBUMIN 4.1   No results for input(s): LIPASE, AMYLASE in the last 168 hours. No results for input(s): AMMONIA in the last 168 hours. Coagulation Profile: No results for input(s): INR, PROTIME in the last 168 hours. CBC:  Recent Labs Lab 09/28/16 2335  WBC 5.8  NEUTROABS 3.1  HGB 13.3  HCT 42.2  MCV 88.1  PLT 163   Cardiac Enzymes:  Recent Labs Lab 09/28/16 2335  TROPONINI <0.03   BNP: Invalid input(s): POCBNP CBG: No results for input(s): GLUCAP in the last 168 hours. HbA1C: No results for input(s): HGBA1C in the last 72 hours. Urine analysis:    Component Value Date/Time   COLORURINE RED (A) 03/21/2012 1936   APPEARANCEUR CLOUDY (A) 03/21/2012 1936   LABSPEC 1.018  03/21/2012 1936   PHURINE 6.0 03/21/2012 1936   GLUCOSEU NEGATIVE 03/21/2012 1936   HGBUR LARGE (A) 03/21/2012 1936   BILIRUBINUR MODERATE (A) 03/21/2012 1936   KETONESUR 15 (A) 03/21/2012 1936   PROTEINUR >300 (A) 03/21/2012 1936   UROBILINOGEN 1.0 03/21/2012 1936   NITRITE NEGATIVE 03/21/2012 1936   LEUKOCYTESUR LARGE (A) 03/21/2012 1936   Sepsis Labs: '@LABRCNTIP'$ (procalcitonin:4,lacticidven:4) ) Recent Results (from the past 240 hour(s))  Culture, blood (routine x 2)     Status: None (Preliminary result)   Collection Time: 09/29/16 12:16 AM  Result Value Ref Range Status   Specimen Description BLOOD RIGHT ANTECUBITAL  Final   Special Requests BOTTLES DRAWN AEROBIC AND ANAEROBIC 10 CC EACH  Final   Culture PENDING  Incomplete   Report Status PENDING  Incomplete  Culture, blood (routine x 2)     Status: None (Preliminary result)   Collection Time: 09/29/16 12:20 AM  Result Value Ref Range Status   Specimen Description BLOOD RIGHT HAND  Final   Special Requests BOTTLES DRAWN AEROBIC AND ANAEROBIC [facility] EACH  Final   Culture PENDING  Incomplete   Report Status PENDING  Incomplete     Scheduled Meds: . azithromycin  500 mg Oral Daily   Followed by  . [START ON 09/30/2016] azithromycin  250 mg Oral Daily  . carvedilol  3.125 mg Oral BID  . chlorhexidine  15 mL Mouth Rinse BID  . enoxaparin (LOVENOX) injection  40 mg Subcutaneous Q24H  . guaiFENesin  600 mg Oral BID  . insulin aspart  0-9 Units Subcutaneous TID WC  . ipratropium-albuterol  3 mL Nebulization Q6H  . mouth rinse  15 mL Mouth Rinse q12n4p  . methylPREDNISolone (SOLU-MEDROL) injection  80 mg Intravenous Q12H  . sodium chloride flush  3 mL Intravenous Q12H   Continuous Infusions:  Procedures/Studies: Dg Chest Portable 1 View  Result Date: 09/28/2016 CLINICAL DATA:  Worsening dyspnea for 2 or 3 days. EXAM: PORTABLE CHEST 1 VIEW COMPARISON:  02/05/2016 FINDINGS: Hyperinflation. Mild chronic coarsening of the basilar  interstitial markings. No confluent consolidation. No effusions. No pneumothorax. Hilar and mediastinal contours are unremarkable and unchanged. IMPRESSION: COPD changes.  No consolidation or effusion. Electronically Signed   By: Sonya Oliver M.D.   On: 09/28/2016 23:53    Sonya Malicoat, DO  [facility] Pager [phone]  If 7PM-7AM, please contact night-coverage [email or web address] Password [email or web address] 09/29/2016, 7:35 AM   LOS: 0 days

## 2016-09-29 NOTE — ED Notes (Signed)
Respiratory paged

## 2016-09-29 NOTE — ED Notes (Addendum)
CRITICAL VALUE ALERT  Critical value received:  [id number]  Date of notification:  09/29/2016  Time of notification:  0138  Critical value read back: yes  Nurse who received alert:  Natividad Brood, RN  MD notified (1st page):  Dr. Tomi Bamberger  Time of first page:  0140  MD notified (2nd page):  Time of second page:  Responding MD:  Dr. Tomi Bamberger  Time MD responded:  [phone]

## 2016-09-29 NOTE — ED Notes (Signed)
Pt sitting up in bed resting while getting a continuous neb treatment. Pt has no co,plaints at this time.

## 2016-09-29 NOTE — H&P (Signed)
History and Physical    Sonya Oliver LOV:[id number] DOB: 1941/03/20 DOA: 09/28/2016  PCP: Wardell Honour, MD  Patient coming from: home  Chief Complaint:  Sob, wheezing, cough  HPI: Sonya Oliver is a 76 y.o. female with medical history significant of  COPD, CHF, CRF on 2liters home oxygen, HTN, DM comes in with 3 days of progressive worsening sob, wheezing and cough.  Denies any nasal congestion.  She has albuterol inhaler at home and was about to run out, and her breathing was getting no better so she came to ED after calling 911.  With EMS she got albuterol neb with solumedrol and was in moderate resp distress was placed on bipap soon after arrival to the ED.  Pt is mentating normally at this time has been on bipap for over an hour.  She feels her breathing is better.  She can answer questions.  She denies any fevers at home.  Denies any swelling in her legs.  No sick contacts.  No uri symptoms.  Pt referred for admission for respiratory failure due to copde.  Review of Systems: As per HPI otherwise 10 point review of systems negative.   Past Medical History:  Diagnosis Date  . Arthritis   . Bladder tumor   . Cancer ([place])    bladder  . Cataract   . CHF (congestive heart failure) ([place])    NO CARDIOLOGIST---  MONITORED BY PCP DR Jacelyn Grip  . COPD with emphysema ([place])    last Acute Exacerbation 08-21-2014  . Dyspnea on exertion   . H/O pulmonary edema    jan 2014--  acute CHF w/ pulmonary edema  . Hypertension   . Hyperthyroidism    currently no meds -- labs stable  . Nocturia   . Nocturnal oxygen desaturation    uses O2  HS via [place]--  and PRN daytime  . Osteoporosis   . Stroke ([place])   . Type 2 diabetes mellitus ([place])   . Ureteral tumor     Past Surgical History:  Procedure Laterality Date  . ABDOMINAL HYSTERECTOMY  1980'S   W/ BILATERAL SALPINGO-OOPHORECTOMY  . CATARACT EXTRACTION W/PHACO Left 01/31/2014   Procedure: CATARACT EXTRACTION PHACO AND INTRAOCULAR LENS  PLACEMENT (IOC);  Surgeon: Tonny Branch, MD;  Location: [facility];  Service: Ophthalmology;  Laterality: Left;  CDE: 23.78  . CATARACT EXTRACTION W/PHACO Right 02/28/2014   Procedure: CATARACT EXTRACTION PHACO AND INTRAOCULAR LENS PLACEMENT (IOC);  Surgeon: Tonny Branch, MD;  Location: [facility];  Service: Ophthalmology;  Laterality: Right;  CDE:  11.34  . CYSTOSCOPY  03/23/2012   Procedure: CYSTOSCOPY;  Surgeon: Claybon Jabs, MD;  Location: [facility];  Service: Urology;  Laterality: N/A;  . CYSTOSCOPY WITH RETROGRADE PYELOGRAM, URETEROSCOPY AND STENT PLACEMENT Right 10/21/2014   Procedure: CYSTOSCOPY WITH RETROGRADE PYELOGRAM, URETEROSCOPY, BLADDER TUMOR BIOPSY;  Surgeon: Claybon Jabs, MD;  Location: [facility];  Service: Urology;  Laterality: Right;  . TRANSTHORACIC ECHOCARDIOGRAM  06-21-2013   moderate LVH,  grade I diastolic dysfunction, ef 88-41%,  mild MR & TR  . TRANSURETHRAL RESECTION OF BLADDER TUMOR  03/23/2012   Procedure: TRANSURETHRAL RESECTION OF BLADDER TUMOR (TURBT);  Surgeon: Claybon Jabs, MD;  Location: [facility];  Service: Urology;  Laterality: N/A;  . TRANSURETHRAL RESECTION OF BLADDER TUMOR N/A 01/08/2013   Procedure: TRANSURETHRAL RESECTION OF BLADDER TUMOR (TURBT);  Surgeon: Claybon Jabs, MD;  Location[facility];  Service: Urology;  Laterality: N/A;  reports that she quit smoking about 10 years ago. Her smoking use included Cigarettes. She has a 50.00 pack-year smoking history. She has never used smokeless tobacco. She reports that she does not drink alcohol or use drugs.  No Known Allergies  Family History  Problem Relation Age of Onset  . Diabetes Father   . Congestive Heart Failure Father   . COPD Father   . Kidney disease Father   . Hypertension Father   . Diabetes Mother     with kidney disease  . Kidney disease Mother   . Heart attack Brother   . Asthma Brother   . Diabetes Daughter   . Diabetes Son   . Cancer Brother     throat    . Hypertension Brother     Prior to Admission medications   Medication Sig Start Date End Date Taking? Authorizing Provider  albuterol (PROVENTIL HFA;VENTOLIN HFA) 108 (90 Base) MCG/ACT inhaler Inhale 1-2 puffs into the lungs every 6 (six) hours as needed for wheezing or shortness of breath. 04/07/16   Wardell Honour, MD  alendronate (FOSAMAX) 70 MG tablet Take 1 tablet (70 mg total) by mouth once a week. Take with a full glass of water on an empty stomach. 07/08/16   Cherre Robins, PharmD  carvedilol (COREG) 3.125 MG tablet TAKE 1 TABLET BY MOUTH TWICE DAILY 05/10/16   Wardell Honour, MD  Cholecalciferol (VITAMIN D3) 2000 UNITS TABS Take 1 tablet by mouth daily.    Historical Provider, MD  fluticasone furoate-vilanterol (BREO ELLIPTA) 100-25 MCG/INH AEPB Inhale 1 puff into the lungs daily. 02/27/16   Wardell Honour, MD  furosemide (LASIX) 40 MG tablet TAKE 1 TABLET BY MOUTH ONCE DAILY 06/28/16   Wardell Honour, MD  guaiFENesin (MUCINEX) 600 MG 12 hr tablet Take 1 tablet (600 mg total) by mouth 2 (two) times daily. 12/18/15   Kathie Dike, MD  INCRUSE ELLIPTA 62.5 MCG/INH AEPB Take 1 puff by mouth daily. 05/06/16   Historical Provider, MD  KLOR-CON M20 20 MEQ tablet TAKE 1 TABLET (20 MEQ TOTAL) BY MOUTH DAILY. 06/07/16   Wardell Honour, MD  metFORMIN (GLUCOPHAGE) 500 MG tablet Take 1 tablet (500 mg total) by mouth daily. 06/28/16   Wardell Honour, MD  Prenatal Vit-Fe Fumarate-FA (M-VIT) tablet Take 1 tablet by mouth daily.    Historical Provider, MD  RESTASIS MULTIDOSE 0.05 % ophthalmic emulsion Place 1 drop into both eyes 2 (two) times daily. 01/19/16   Historical Provider, MD    Physical Exam: Vitals:   09/29/16 0200 09/29/16 0300 09/29/16 0311 09/29/16 0330  BP: 117/66 120/61  113/55  Pulse: 80 78 79 80  Resp: '19 20 19 20  '$ Temp:      SpO2: 99% 96% 96% 95%  Weight:      Height:       Constitutional: NAD, calm, comfortable Vitals:   09/29/16 0200 09/29/16 0300 09/29/16 0311  09/29/16 0330  BP: 117/66 120/61  113/55  Pulse: 80 78 79 80  Resp: '19 20 19 20  '$ Temp:      SpO2: 99% 96% 96% 95%  Weight:      Height:       Eyes: PERRL, lids and conjunctivae normal ENMT: Mucous membranes are moist. Posterior pharynx clear of any exudate or lesions.Normal dentition.  Neck: normal, supple, no masses, no thyromegaly Respiratory: clear to auscultation bilaterally, mild basilar wheezing, no crackles. Normal respiratory effort. No accessory muscle use.  Cardiovascular: Regular rate and  rhythm, no murmurs / rubs / gallops. No extremity edema. 2+ pedal pulses. No carotid bruits.  Abdomen: no tenderness, no masses palpated. No hepatosplenomegaly. Bowel sounds positive.  Musculoskeletal: no clubbing / cyanosis. No joint deformity upper and lower extremities. Good ROM, no contractures. Normal muscle tone.  Skin: no rashes, lesions, ulcers. No induration Neurologic: CN 2-12 grossly intact. Sensation intact, DTR normal. Strength 5/5 in all 4.  Psychiatric: Normal judgment and insight. Alert and oriented x 3. Normal mood.    Labs on Admission: I have personally reviewed following labs and imaging studies  CBC:  Recent Labs Lab 09/28/16 2335  WBC 5.8  NEUTROABS 3.1  HGB 13.3  HCT 42.2  MCV 88.1  PLT 094   Basic Metabolic Panel:  Recent Labs Lab 09/29/16 0015  NA 142  K 3.6  CL 99*  CO2 32  GLUCOSE 112*  BUN 13  CREATININE 0.73  CALCIUM 9.2   GFR: Estimated Creatinine Clearance: 64.1 mL/min (by C-G formula based on SCr of 0.73 mg/dL). Liver Function Tests:  Recent Labs Lab 09/29/16 0015  AST 18  ALT 14  ALKPHOS 49  BILITOT 1.0  PROT 8.7*  ALBUMIN 4.1    Cardiac Enzymes:  Recent Labs Lab 09/28/16 2335  TROPONINI <0.03   Radiological Exams on Admission: Dg Chest Portable 1 View  Result Date: 09/28/2016 CLINICAL DATA:  Worsening dyspnea for 2 or 3 days. EXAM: PORTABLE CHEST 1 VIEW COMPARISON:  02/05/2016 FINDINGS: Hyperinflation. Mild chronic  coarsening of the basilar interstitial markings. No confluent consolidation. No effusions. No pneumothorax. Hilar and mediastinal contours are unremarkable and unchanged. IMPRESSION: COPD changes.  No consolidation or effusion. Electronically Signed   By: Andreas Newport M.D.   On: 09/28/2016 23:53    EKG: Independently reviewed.  Sinus tach no acute issues cxr reviewed no edema or infiltrate Old records reviewed Case discussed with dr knapp  Assessment/Plan 76 yo female with acute on chronic hypoxic and hypercapneic respiratory failure due to copde  Principal Problem:   Respiratory failure with hypoxia and hypercapnia ([place])- abg is improving.  Pt mentating normally.  resp status improved clinically with bipap support.  Cont bipap and wean as she tolerates.  Suspect she will be on it for at least several more hours.  Treat copde as below.  Active Problems:   COPD exacerbation ([place])- freq nebs.  Iv solumedrol.  Zpack.  cxr is neg.  Lactic acid nml   SOB (shortness of breath)- due to copd, treat as above   Diabetes (H[facility])-  SSI   CHF (congestive heart failure) ([place])- stable and compensated at this time, hold off on fluids for now   Clarification of home meds pending  DVT prophylaxis:  Lovenox, scds  Code Status:   DNR confirmed with patient and her son Family Communication:  Son present with whom she lives with Disposition Plan:  Per day team Consults called:  none Admission status:  Admission to stepdown   Doyt Castellana A MD [facility]  If 7PM-7AM, please contact night-coverage [email or web address] Password [email or web address]  09/29/2016, 4:05 AM

## 2016-09-30 LAB — CBC
HCT: 38.1 % (ref 36.0–46.0)
HEMOGLOBIN: 11.8 g/dL — AB (ref 12.0–15.0)
MCH: 27.1 pg (ref 26.0–34.0)
MCHC: 31 g/dL (ref 30.0–36.0)
MCV: 87.6 fL (ref 78.0–100.0)
Platelets: 151 10*3/uL (ref 150–400)
RBC: 4.35 MIL/uL (ref 3.87–5.11)
RDW: 14.1 % (ref 11.5–15.5)
WBC: 5.3 10*3/uL (ref 4.0–10.5)

## 2016-09-30 LAB — GLUCOSE, CAPILLARY
Glucose-Capillary: 129 mg/dL — ABNORMAL HIGH (ref 65–99)
Glucose-Capillary: 169 mg/dL — ABNORMAL HIGH (ref 65–99)
Glucose-Capillary: 154 mg/dL — ABNORMAL HIGH (ref 65–99)
Glucose-Capillary: 198 mg/dL — ABNORMAL HIGH (ref 65–99)

## 2016-09-30 LAB — BASIC METABOLIC PANEL
Anion gap: 7 (ref 5–15)
BUN: 25 mg/dL — ABNORMAL HIGH (ref 6–20)
CALCIUM: 8.7 mg/dL — AB (ref 8.9–10.3)
CHLORIDE: 96 mmol/L — AB (ref 101–111)
CO2: 34 mmol/L — AB (ref 22–32)
CREATININE: 0.82 mg/dL (ref 0.44–1.00)
GFR calc Af Amer: >60 mL/min (ref >60)
GFR calc non Af Amer: >60 mL/min (ref >60)
GLUCOSE: 114 mg/dL — AB (ref 65–99)
Potassium: 4 mmol/L (ref 3.5–5.1)
Sodium: 137 mmol/L (ref 135–145)

## 2016-09-30 LAB — MAGNESIUM: MAGNESIUM: 2 mg/dL (ref 1.7–2.4)

## 2016-09-30 MED ORDER — BUDESONIDE 0.5 MG/2ML IN SUSP
0.5000 mg | Freq: Two times a day (BID) | RESPIRATORY_TRACT | Status: DC
  Administered 2016-09-30 – 2016-10-02 (×5): 0.5 mg via RESPIRATORY_TRACT
  Filled 2016-09-30 (×5): qty 2

## 2016-09-30 MED ORDER — FUROSEMIDE 40 MG PO TABS
40.0000 mg | ORAL_TABLET | Freq: Every day | ORAL | Status: DC
  Administered 2016-09-30 – 2016-10-02 (×3): 40 mg via ORAL
  Filled 2016-09-30 (×3): qty 1

## 2016-09-30 NOTE — Progress Notes (Signed)
PROGRESS NOTE  ABERDEEN HAFEN BJS:[id number] DOB: 08-17-1941 DOA: 09/28/2016 PCP: Wardell Honour, MD  Brief History44  76 year old female with a history of COPD, chronic respiratory failure on 2 L at nighttime, diastolic CHF, hypertension, diabetes mellitus, stroke, bladder cancer presented with 3 day history of worsening shortness of breath and nonproductive cough. The patient lives with her son, but she states that she has not had any sick contacts. She denies any fevers, chills, nausea, vomiting, diarrhea. She has been using her albuterol inhaler home without improvement. She was found to be in respiratory distress and given albuterol and Solu-Medrol by EMS. She was placed on BiPAP in the emergency department secondary to moderate respiratory distress.  Assessment/Plan: Acute on chronic respiratory failure with hypoxia and hypercarbia -Secondary to COPD exacerbation -09/29/2016--ABG--7.34/55/69/26 on 0.4 -Wean BiPAP as tolerated-->now stable on 4L [place] -Pulmonary hygiene -viral respiratory panel--negative  COPD exacerbation -Continue intravenous IV Solu-Medrol -Continue duo nebs -Continue Pulmicort-->increase to 0,5 -Continue azithromycin  Chronic diastolic CHF -60/73/7106 echo EF 60-65%, grade 1 daily -Continue home dose furosemide -Previous baseline weight 177-180 according to previous records  -daily weights -Continue carvedilol  -appears euvolemic clinically  Diabetes mellitus type 2 -Continue NovoLog sliding scale -Check hemoglobin A1c--pending -hold metformin  Hypertension -Continue carvedilol -controlled  LE edema and pain -venous duplex--negative   Disposition Plan:   Home likely 2/3 Family Communication:   daughters and daughter in-law updated at bedside 1/31  Consultants:  none  Code Status:  FULL  DVT Prophylaxis:   [place] Lovenox   Procedures: As Listed in Progress Note Above  Antibiotics: Azithromycin  1/31>>>   Subjective: Patient says that she is breathing better but still has dyspnea on exertion. She has nonproductive cough. Denies any fevers, chills, chest pain, hemoptysis, nausea, vomiting, diarrhea. No abdominal pain. No dysuria.  Objective: Vitals:   09/30/16 0300 09/30/16 0400 09/30/16 0500 09/30/16 0600  BP: 109/67 116/69 133/67 (!) 149/69  Pulse: (!) 59 62 (!) 58 71  Resp: '17 16 15 15  '$ Temp:  98.3 F (36.8 C)    TempSrc:  Oral    SpO2: 99% 99% 100% 98%  Weight:   76.8 kg (169 lb 5 oz)   Height:        Intake/Output Summary (Last 24 hours) at 09/30/16 0711 Last data filed at 09/29/16 2200  Gross per 24 hour  Intake              600 ml  Output             1000 ml  Net             -400 ml   Weight change: -6.662 kg (-14 lb 11 oz) Exam:   General:  Pt is alert, follows commands appropriately, not in acute distress  HEENT: No icterus, No thrush, No neck mass, [place]/AT  Cardiovascular: RRR, S1/S2, no rubs, no gallops  Respiratory: Diminished breath sounds at the bases. Bibasilar rales. Bibasilar wheeze.  Abdomen: Soft/+BS, non tender, non distended, no guarding  Extremities: 1 + LE edema, No lymphangitis, No petechiae, No rashes, no synovitis   Data Reviewed: I have personally reviewed following labs and imaging studies Basic Metabolic Panel:  Recent Labs Lab 09/29/16 0015 09/30/16 0501  NA 142 137  K 3.6 4.0  CL 99* 96*  CO2 32 34*  GLUCOSE 112* 114*  BUN 13 25*  CREATININE 0.73 0.82  CALCIUM 9.2 8.7*  MG  --  2.0   Liver Function Tests:  Recent Labs Lab 09/29/16 0015  AST 18  ALT 14  ALKPHOS 49  BILITOT 1.0  PROT 8.7*  ALBUMIN 4.1   No results for input(s): LIPASE, AMYLASE in the last 168 hours. No results for input(s): AMMONIA in the last 168 hours. Coagulation Profile: No results for input(s): INR, PROTIME in the last 168 hours. CBC:  Recent Labs Lab 09/28/16 2335 09/30/16 0501  WBC 5.8 5.3  NEUTROABS 3.1  --   HGB 13.3  11.8*  HCT 42.2 38.1  MCV 88.1 87.6  PLT 163 151   Cardiac Enzymes:  Recent Labs Lab 09/28/16 2335  TROPONINI <0.03   BNP: Invalid input(s): POCBNP CBG:  Recent Labs Lab 09/29/16 0827 09/29/16 1129 09/29/16 1605 09/29/16 2159  GLUCAP 166* 108* 108* 287*   HbA1C: No results for input(s): HGBA1C in the last 72 hours. Urine analysis:    Component Value Date/Time   COLORURINE RED (A) 03/21/2012 1936   APPEARANCEUR CLOUDY (A) 03/21/2012 1936   LABSPEC 1.018 03/21/2012 1936   PHURINE 6.0 03/21/2012 1936   GLUCOSEU NEGATIVE 03/21/2012 1936   HGBUR LARGE (A) 03/21/2012 1936   BILIRUBINUR MODERATE (A) 03/21/2012 1936   KETONESUR 15 (A) 03/21/2012 1936   PROTEINUR >300 (A) 03/21/2012 1936   UROBILINOGEN 1.0 03/21/2012 1936   NITRITE NEGATIVE 03/21/2012 1936   LEUKOCYTESUR LARGE (A) 03/21/2012 1936   Sepsis Labs: '@LABRCNTIP'$ (procalcitonin:4,lacticidven:4) ) Recent Results (from the past 240 hour(s))  Culture, blood (routine x 2)     Status: None (Preliminary result)   Collection Time: 09/29/16 12:16 AM  Result Value Ref Range Status   Specimen Description BLOOD RIGHT ANTECUBITAL  Final   Special Requests BOTTLES DRAWN AEROBIC AND ANAEROBIC 10 CC EACH  Final   Culture NO GROWTH < 12 HOURS  Final   Report Status PENDING  Incomplete  Culture, blood (routine x 2)     Status: None (Preliminary result)   Collection Time: 09/29/16 12:20 AM  Result Value Ref Range Status   Specimen Description BLOOD RIGHT HAND  Final   Special Requests BOTTLES DRAWN AEROBIC AND ANAEROBIC [facility] EACH  Final   Culture PENDING  Incomplete   Report Status PENDING  Incomplete  MRSA PCR Screening     Status: None   Collection Time: 09/29/16  5:14 AM  Result Value Ref Range Status   MRSA by PCR NEGATIVE NEGATIVE Final    Comment:        The GeneXpert MRSA Assay (FDA approved for NASAL specimens only), is one component of a comprehensive MRSA colonization surveillance program. It is  not intended to diagnose MRSA infection nor to guide or monitor treatment for MRSA infections.   Respiratory Panel by PCR     Status: None   Collection Time: 09/29/16  7:48 AM  Result Value Ref Range Status   Adenovirus NOT DETECTED NOT DETECTED Final   Coronavirus 229E NOT DETECTED NOT DETECTED Final   Coronavirus HKU1 NOT DETECTED NOT DETECTED Final   Coronavirus NL63 NOT DETECTED NOT DETECTED Final   Coronavirus OC43 NOT DETECTED NOT DETECTED Final   Metapneumovirus NOT DETECTED NOT DETECTED Final   Rhinovirus / Enterovirus NOT DETECTED NOT DETECTED Final   Influenza A NOT DETECTED NOT DETECTED Final   Influenza B NOT DETECTED NOT DETECTED Final   Parainfluenza Virus 1 NOT DETECTED NOT DETECTED Final   Parainfluenza Virus 2 NOT DETECTED NOT DETECTED Final   Parainfluenza Virus 3 NOT DETECTED NOT DETECTED Final  Parainfluenza Virus 4 NOT DETECTED NOT DETECTED Final   Respiratory Syncytial Virus NOT DETECTED NOT DETECTED Final   Bordetella pertussis NOT DETECTED NOT DETECTED Final   Chlamydophila pneumoniae NOT DETECTED NOT DETECTED Final   Mycoplasma pneumoniae NOT DETECTED NOT DETECTED Final    Comment: Performed at [facility], [place] [address] [place], [place] [address]     Scheduled Meds: . azithromycin  250 mg Oral Daily  . budesonide (PULMICORT) nebulizer solution  0.25 mg Nebulization BID  . carvedilol  3.125 mg Oral BID  . chlorhexidine  15 mL Mouth Rinse BID  . enoxaparin (LOVENOX) injection  40 mg Subcutaneous Q24H  . furosemide  20 mg Intravenous Daily  . guaiFENesin  600 mg Oral BID  . insulin aspart  0-5 Units Subcutaneous QHS  . insulin aspart  0-9 Units Subcutaneous TID WC  . ipratropium-albuterol  3 mL Nebulization TID  . mouth rinse  15 mL Mouth Rinse q12n4p  . methylPREDNISolone (SOLU-MEDROL) injection  80 mg Intravenous Q12H  . sodium chloride flush  3 mL Intravenous Q12H   Continuous Infusions:  Procedures/Studies: US Venous Img Lower  Bilateral  Result Date: 09/29/2016 CLINICAL DATA:  Lower extremity edema. EXAM: BILATERAL LOWER EXTREMITY VENOUS DOPPLER ULTRASOUND TECHNIQUE: Gray-scale sonography with graded compression, as well as color Doppler and duplex ultrasound were performed to evaluate the lower extremity deep venous systems from the level of the common femoral vein and including the common femoral, femoral, profunda femoral, popliteal and calf veins including the posterior tibial, peroneal and gastrocnemius veins when visible. The superficial great saphenous vein was also interrogated. Spectral Doppler was utilized to evaluate flow at rest and with distal augmentation maneuvers in the common femoral, femoral and popliteal veins. COMPARISON:  No recent prior. FINDINGS: RIGHT LOWER EXTREMITY Common Femoral Vein: No evidence of thrombus. Normal compressibility, respiratory phasicity and response to augmentation. Saphenofemoral Junction: No evidence of thrombus. Normal compressibility and flow on color Doppler imaging. Profunda Femoral Vein: No evidence of thrombus. Normal compressibility and flow on color Doppler imaging. Femoral Vein: No evidence of thrombus. Normal compressibility, respiratory phasicity and response to augmentation. Popliteal Vein: No evidence of thrombus. Normal compressibility, respiratory phasicity and response to augmentation. Calf Veins: No evidence of thrombus. Normal compressibility and flow on color Doppler imaging. Superficial Great Saphenous Vein: No evidence of thrombus. Normal compressibility and flow on color Doppler imaging. Other Findings:  None. LEFT LOWER EXTREMITY Common Femoral Vein: No evidence of thrombus. Normal compressibility, respiratory phasicity and response to augmentation. Saphenofemoral Junction: No evidence of thrombus. Normal compressibility and flow on color Doppler imaging. Profunda Femoral Vein: No evidence of thrombus. Normal compressibility and flow on color Doppler imaging. Femoral  Vein: No evidence of thrombus. Normal compressibility, respiratory phasicity and response to augmentation. Popliteal Vein: No evidence of thrombus. Normal compressibility, respiratory phasicity and response to augmentation. Calf Veins: No evidence of thrombus. Normal compressibility and flow on color Doppler imaging. Superficial Great Saphenous Vein: No evidence of thrombus. Normal compressibility and flow on color Doppler imaging. Other Findings:  None. IMPRESSION: No evidence of deep venous thrombosis. Electronically Signed   By: Marcello Moores  Register   On: 09/29/2016 15:16   Dg Chest Portable 1 View  Result Date: 09/28/2016 CLINICAL DATA:  Worsening dyspnea for 2 or 3 days. EXAM: PORTABLE CHEST 1 VIEW COMPARISON:  02/05/2016 FINDINGS: Hyperinflation. Mild chronic coarsening of the basilar interstitial markings. No confluent consolidation. No effusions. No pneumothorax. Hilar and mediastinal contours are unremarkable and unchanged. IMPRESSION: COPD  changes.  No consolidation or effusion. Electronically Signed   By: Andreas Newport M.D.   On: 09/28/2016 23:53    Ercie Eliasen, DO  [facility] Pager [phone]  If 7PM-7AM, please contact night-coverage [email or web address] Password [email or web address] 09/30/2016, 7:11 AM   LOS: 1 day

## 2016-10-01 LAB — HEMOGLOBIN A1C
HEMOGLOBIN A1C: 5.7 % — AB (ref 4.8–5.6)
MEAN PLASMA GLUCOSE: 117 mg/dL

## 2016-10-01 LAB — BASIC METABOLIC PANEL
Anion gap: 6 (ref 5–15)
BUN: 28 mg/dL — AB (ref 6–20)
CALCIUM: 8.6 mg/dL — AB (ref 8.9–10.3)
CHLORIDE: 98 mmol/L — AB (ref 101–111)
CO2: 33 mmol/L — AB (ref 22–32)
CREATININE: 0.67 mg/dL (ref 0.44–1.00)
GFR calc non Af Amer: >60 mL/min (ref >60)
Glucose, Bld: 133 mg/dL — ABNORMAL HIGH (ref 65–99)
Potassium: 4 mmol/L (ref 3.5–5.1)
SODIUM: 137 mmol/L (ref 135–145)

## 2016-10-01 LAB — GLUCOSE, CAPILLARY
Glucose-Capillary: 193 mg/dL — ABNORMAL HIGH (ref 65–99)
Glucose-Capillary: 198 mg/dL — ABNORMAL HIGH (ref 65–99)
Glucose-Capillary: 185 mg/dL — ABNORMAL HIGH (ref 65–99)
Glucose-Capillary: 202 mg/dL — ABNORMAL HIGH (ref 65–99)

## 2016-10-01 MED ORDER — PREDNISONE 20 MG PO TABS
60.0000 mg | ORAL_TABLET | Freq: Every day | ORAL | Status: DC
  Administered 2016-10-02: 60 mg via ORAL
  Filled 2016-10-01: qty 3

## 2016-10-01 MED ORDER — AZITHROMYCIN 250 MG PO TABS: 250.0000 mg | ORAL_TABLET | Freq: Once | ORAL | 0 refills | Status: AC

## 2016-10-01 NOTE — Care Management Note (Signed)
Case Management Note  Patient Details  Name: Sonya Oliver MRN: [id number] Date of Birth: 12-Mar-1941  Expected Discharge Date:       10/02/2016           Expected Discharge Plan:  Home/Self Care  In-House Referral:  NA  Discharge planning Services  CM Consult  Post Acute Care Choice:  Durable Medical Equipment Choice offered to:  Patient  DME Arranged:  Oxygen DME Agency:  [facility].  Status of Service:  Completed, signed off    Additional Comments: Pt will DC home tomorrow. She will requires continuous oxygen at DC. Pt has HS oxygen PTA. Romualdo Bolk, of [facility], is aware of oxygen needs and will deliver port tanks to pt room prior to DC.   Sherald Barge, RN 10/01/2016, 12:49 PM

## 2016-10-01 NOTE — Progress Notes (Signed)
PROGRESS NOTE  Sonya Oliver KDT:[id number] DOB: 03-Sep-1940 DOA: 09/28/2016 PCP: Wardell Honour, MD  Brief History43 76 year old female with a history of COPD, chronic respiratory failure on 2 L at nighttime, diastolic CHF, hypertension, diabetes mellitus, stroke, bladder cancer presented with 3 day history of worsening shortness of breath and nonproductive cough. The patient lives with her son, but she states that she has not had any sick contacts. She denies any fevers, chills, nausea, vomiting, diarrhea. She has been using her albuterol inhaler home without improvement. She was found to be in respiratory distress and given albuterol and Solu-Medrol by EMS. She was placed on BiPAP in the emergency department secondary to moderate respiratory distress.  Assessment/Plan: Acute on chronic respiratory failure with hypoxia and hypercarbia -Secondary to COPD exacerbation -09/29/2016--ABG--7.34/55/69/26 on 0.4 -Wean BiPAP as tolerated-->now stable on 4L-->3L -Pulmonary hygiene -viral respiratory panel--negative -will need oxygen '@3L'$ , 24hr daily -desaturated with ambulation to 86%  COPD exacerbation -Continue intravenous IV Solu-Medrol-->wean to prednisone -Continue duo nebs -Continue Pulmicort-->increase to 0.5 mg -Continue azithromycin  Chronic diastolic CHF -99/83/3825 echo EF 60-65%, grade 1 daily -Continue home dose furosemide -Previous baseline weight 177-180 according to previous records  -daily weights -Continue carvedilol  -appears euvolemic clinically  Diabetes mellitus type 2 -Continue NovoLog sliding scale -09/28/16 hemoglobin A1c--5.7 -hold metformin  Hypertension -Continue carvedilol -controlled  LE edema and pain -venous duplex--negative   Disposition Plan: Home likely 2/3 Family Communication: No family at bedside  Consultants: none  Code Status: FULL  DVT Prophylaxis: [place] Lovenox   Procedures: As Listed in Progress  Note Above  Antibiotics: Azithromycin 1/31>>>    Subjective: Overall, she is feeling better but still having some dyspnea on exertion. Denies any fevers, chills, chest pain, nausea, vomiting, diarrhea, abdominal pain. No dysuria or hematuria. Overall breathing better.  Objective: Vitals:   09/30/16 2046 09/30/16 2119 10/01/16 0500 10/01/16 0830  BP:  140/67 136/75   Pulse:  65 68   Resp:  20 20   Temp:  98.4 F (36.9 C) 98.2 F (36.8 C)   TempSrc:  Oral Oral   SpO2: 94% 94% 98% 97%  Weight:   77.5 kg (170 lb 13.7 oz)   Height:        Intake/Output Summary (Last 24 hours) at 10/01/16 1028 Last data filed at 10/01/16 0526  Gross per 24 hour  Intake              480 ml  Output              200 ml  Net              280 ml   Weight change: 1.037 kg (2 lb 4.6 oz) Exam:   General:  Pt is alert, follows commands appropriately, not in acute distress  HEENT: No icterus, No thrush, No neck mass, [place]/AT  Cardiovascular: RRR, S1/S2, no rubs, no gallops  Respiratory: Diminished breath sounds at the bases. Bibasilar rales. No wheezing. Good air movement.  Abdomen: Soft/+BS, non tender, non distended, no guarding  Extremities: trace edema, No lymphangitis, No petechiae, No rashes, no synovitis   Data Reviewed: I have personally reviewed following labs and imaging studies Basic Metabolic Panel:  Recent Labs Lab 09/29/16 0015 09/30/16 0501 10/01/16 0459  NA 142 137 137  K 3.6 4.0 4.0  CL 99* 96* 98*  CO2 32 34* 33*  GLUCOSE 112* 114* 133*  BUN 13 25* 28*  CREATININE 0.73 0.82 0.67  CALCIUM 9.2 8.7* 8.6*  MG  --  2.0  --    Liver Function Tests:  Recent Labs Lab 09/29/16 0015  AST 18  ALT 14  ALKPHOS 49  BILITOT 1.0  PROT 8.7*  ALBUMIN 4.1   No results for input(s): LIPASE, AMYLASE in the last 168 hours. No results for input(s): AMMONIA in the last 168 hours. Coagulation Profile: No results for input(s): INR, PROTIME in the last 168  hours. CBC:  Recent Labs Lab 09/28/16 2335 09/30/16 0501  WBC 5.8 5.3  NEUTROABS 3.1  --   HGB 13.3 11.8*  HCT 42.2 38.1  MCV 88.1 87.6  PLT 163 151   Cardiac Enzymes:  Recent Labs Lab 09/28/16 2335  TROPONINI <0.03   BNP: Invalid input(s): POCBNP CBG:  Recent Labs Lab 09/30/16 0828 09/30/16 1126 09/30/16 1616 09/30/16 2117 10/01/16 0729  GLUCAP 129* 169* 154* 198* 193*   HbA1C:  Recent Labs  09/28/16 2335  HGBA1C 5.7*   Urine analysis:    Component Value Date/Time   COLORURINE RED (A) 03/21/2012 1936   APPEARANCEUR CLOUDY (A) 03/21/2012 1936   LABSPEC 1.018 03/21/2012 1936   PHURINE 6.0 03/21/2012 1936   GLUCOSEU NEGATIVE 03/21/2012 1936   HGBUR LARGE (A) 03/21/2012 1936   BILIRUBINUR MODERATE (A) 03/21/2012 1936   KETONESUR 15 (A) 03/21/2012 1936   PROTEINUR >300 (A) 03/21/2012 1936   UROBILINOGEN 1.0 03/21/2012 1936   NITRITE NEGATIVE 03/21/2012 1936   LEUKOCYTESUR LARGE (A) 03/21/2012 1936   Sepsis Labs: '@LABRCNTIP'$ (procalcitonin:4,lacticidven:4) ) Recent Results (from the past 240 hour(s))  Culture, blood (routine x 2)     Status: None (Preliminary result)   Collection Time: 09/29/16 12:16 AM  Result Value Ref Range Status   Specimen Description BLOOD RIGHT ANTECUBITAL  Final   Special Requests BOTTLES DRAWN AEROBIC AND ANAEROBIC 10 CC EACH  Final   Culture NO GROWTH 2 DAYS  Final   Report Status PENDING  Incomplete  Culture, blood (routine x 2)     Status: None (Preliminary result)   Collection Time: 09/29/16 12:20 AM  Result Value Ref Range Status   Specimen Description BLOOD RIGHT HAND  Final   Special Requests BOTTLES DRAWN AEROBIC AND ANAEROBIC [facility] EACH  Final   Culture PENDING  Incomplete   Report Status PENDING  Incomplete  MRSA PCR Screening     Status: None   Collection Time: 09/29/16  5:14 AM  Result Value Ref Range Status   MRSA by PCR NEGATIVE NEGATIVE Final    Comment:        The GeneXpert MRSA Assay (FDA approved for  NASAL specimens only), is one component of a comprehensive MRSA colonization surveillance program. It is not intended to diagnose MRSA infection nor to guide or monitor treatment for MRSA infections.   Respiratory Panel by PCR     Status: None   Collection Time: 09/29/16  7:48 AM  Result Value Ref Range Status   Adenovirus NOT DETECTED NOT DETECTED Final   Coronavirus 229E NOT DETECTED NOT DETECTED Final   Coronavirus HKU1 NOT DETECTED NOT DETECTED Final   Coronavirus NL63 NOT DETECTED NOT DETECTED Final   Coronavirus OC43 NOT DETECTED NOT DETECTED Final   Metapneumovirus NOT DETECTED NOT DETECTED Final   Rhinovirus / Enterovirus NOT DETECTED NOT DETECTED Final   Influenza A NOT DETECTED NOT DETECTED Final   Influenza B NOT DETECTED NOT DETECTED Final   Parainfluenza Virus 1 NOT DETECTED NOT DETECTED Final  Parainfluenza Virus 2 NOT DETECTED NOT DETECTED Final   Parainfluenza Virus 3 NOT DETECTED NOT DETECTED Final   Parainfluenza Virus 4 NOT DETECTED NOT DETECTED Final   Respiratory Syncytial Virus NOT DETECTED NOT DETECTED Final   Bordetella pertussis NOT DETECTED NOT DETECTED Final   Chlamydophila pneumoniae NOT DETECTED NOT DETECTED Final   Mycoplasma pneumoniae NOT DETECTED NOT DETECTED Final    Comment: Performed at [facility], [place] [address] [place], [place] [address]     Scheduled Meds: . azithromycin  250 mg Oral Daily  . budesonide (PULMICORT) nebulizer solution  0.5 mg Nebulization BID  . carvedilol  3.125 mg Oral BID  . chlorhexidine  15 mL Mouth Rinse BID  . enoxaparin (LOVENOX) injection  40 mg Subcutaneous Q24H  . furosemide  40 mg Oral Daily  . guaiFENesin  600 mg Oral BID  . insulin aspart  0-5 Units Subcutaneous QHS  . insulin aspart  0-9 Units Subcutaneous TID WC  . ipratropium-albuterol  3 mL Nebulization TID  . mouth rinse  15 mL Mouth Rinse q12n4p  . [START ON 10/02/2016] predniSONE  60 mg Oral Q breakfast  . sodium chloride flush  3 mL  Intravenous Q12H   Continuous Infusions:  Procedures/Studies: US Venous Img Lower Bilateral  Result Date: 09/29/2016 CLINICAL DATA:  Lower extremity edema. EXAM: BILATERAL LOWER EXTREMITY VENOUS DOPPLER ULTRASOUND TECHNIQUE: Gray-scale sonography with graded compression, as well as color Doppler and duplex ultrasound were performed to evaluate the lower extremity deep venous systems from the level of the common femoral vein and including the common femoral, femoral, profunda femoral, popliteal and calf veins including the posterior tibial, peroneal and gastrocnemius veins when visible. The superficial great saphenous vein was also interrogated. Spectral Doppler was utilized to evaluate flow at rest and with distal augmentation maneuvers in the common femoral, femoral and popliteal veins. COMPARISON:  No recent prior. FINDINGS: RIGHT LOWER EXTREMITY Common Femoral Vein: No evidence of thrombus. Normal compressibility, respiratory phasicity and response to augmentation. Saphenofemoral Junction: No evidence of thrombus. Normal compressibility and flow on color Doppler imaging. Profunda Femoral Vein: No evidence of thrombus. Normal compressibility and flow on color Doppler imaging. Femoral Vein: No evidence of thrombus. Normal compressibility, respiratory phasicity and response to augmentation. Popliteal Vein: No evidence of thrombus. Normal compressibility, respiratory phasicity and response to augmentation. Calf Veins: No evidence of thrombus. Normal compressibility and flow on color Doppler imaging. Superficial Great Saphenous Vein: No evidence of thrombus. Normal compressibility and flow on color Doppler imaging. Other Findings:  None. LEFT LOWER EXTREMITY Common Femoral Vein: No evidence of thrombus. Normal compressibility, respiratory phasicity and response to augmentation. Saphenofemoral Junction: No evidence of thrombus. Normal compressibility and flow on color Doppler imaging. Profunda Femoral Vein: No  evidence of thrombus. Normal compressibility and flow on color Doppler imaging. Femoral Vein: No evidence of thrombus. Normal compressibility, respiratory phasicity and response to augmentation. Popliteal Vein: No evidence of thrombus. Normal compressibility, respiratory phasicity and response to augmentation. Calf Veins: No evidence of thrombus. Normal compressibility and flow on color Doppler imaging. Superficial Great Saphenous Vein: No evidence of thrombus. Normal compressibility and flow on color Doppler imaging. Other Findings:  None. IMPRESSION: No evidence of deep venous thrombosis. Electronically Signed   By: Marcello Moores  Register   On: 09/29/2016 15:16   Dg Chest Portable 1 View  Result Date: 09/28/2016 CLINICAL DATA:  Worsening dyspnea for 2 or 3 days. EXAM: PORTABLE CHEST 1 VIEW COMPARISON:  02/05/2016 FINDINGS: Hyperinflation. Mild chronic coarsening  of the basilar interstitial markings. No confluent consolidation. No effusions. No pneumothorax. Hilar and mediastinal contours are unremarkable and unchanged. IMPRESSION: COPD changes.  No consolidation or effusion. Electronically Signed   By: Andreas Newport M.D.   On: 09/28/2016 23:53    Taggart Prasad, DO  [facility] Pager [phone]  If 7PM-7AM, please contact night-coverage [email or web address] Password [email or web address] 10/01/2016, 10:28 AM   LOS: 2 days

## 2016-10-01 NOTE — Care Management Note (Signed)
Case Management Note  Patient Details  Name: Sonya Oliver MRN: [id number] Date of Birth: 1941/06/02  Subjective/Objective:            Pt admitted with Respiratory failure with hypoxia. Pt is from home, lives with her son and DIL. No  HH PTA. Family assists with needs. Pt has cane, walker and BSC. Pt has home O2 through [facility]. Pt plans to return home with self care.         Action/Plan:Anticipate DC home 10/02/2016. No CM needs known.    Expected Discharge Date:      10/02/2016            Expected Discharge Plan:     In-House Referral:     Discharge planning Services     Post Acute Care Choice:    Choice offered to:     DME Arranged:    DME Agency:     HH Arranged:    HH Agency:     Status of Service:     If discussed at [employer], dates discussed:    Additional Comments:  Taiyana Kissler, [name], RN 10/01/2016, 7:49 AM

## 2016-10-01 NOTE — Discharge Summary (Signed)
Physician Discharge Summary  Sonya Oliver:[id number] DOB: 11/15/1940 DOA: 09/28/2016  PCP: Wardell Honour, MD  Admit date: 09/28/2016 Discharge date: 10/02/16  Admitted From: Home Disposition:  Home   Recommendations for Outpatient Follow-up:  1. Follow up with PCP in 1-2 weeks 2. Please obtain BMP/CBC in one week   Home Health: yes Equipment/Devices: 3 L oxygen [place]  Discharge Condition: Stable CODE STATUS: FULL Diet recommendation: Heart Healthy  Brief/Interim Summary: 76 year old female with a history of COPD, chronic respiratory failure on 2 L at nighttime, diastolic CHF, hypertension, diabetes mellitus, stroke, bladder cancer presented with 3 day history of worsening shortness of breath and nonproductive cough. The patient lives with her son, but she states that she has not had any sick contacts. She denies any fevers, chills, nausea, vomiting, diarrhea. She has been using her albuterol inhaler home without improvement. She was found to be in respiratory distress and given albuterol and Solu-Medrol by EMS. She was placed on BiPAP in the emergency department secondary to moderate respiratory distress.  Over 24 hours, the patient was able to be weaned off of BiPAP was continued bronchodilators and intravenous steroids.  Discharge Diagnoses:  Acute on chronic respiratory failure with hypoxia and hypercarbia -Secondary to COPD exacerbation -09/29/2016--ABG--7.34/55/69/26 on 0.4 -Wean BiPAP as tolerated-->now stable on 4L-->3L -Pulmonary hygiene -viral respiratory panel--negative -will need oxygen '@3L'$ , 24hr daily -desaturated with ambulation to 86%  COPD exacerbation -Continue intravenous IV Solu-Medrol-->home with prednisone taper over 6 days -Continue duo nebs -ContinuePulmicort-->increase to 0.5 mg during the hospitalization -Continue azithromycin-->one final dose on 01/31/00  Chronic diastolic CHF -60/05/9322 echo EF 60-65%, grade 1 daily -Continue home dose  furosemide -Previous baseline weight 177-180 according to previous records  -daily weights -Continue carvedilol  -appears euvolemic clinically  Diabetes mellitus type 2 -Continue NovoLog sliding scale -09/28/16 hemoglobin A1c--5.7 -hold metformin--restart after d/c  Hypertension -Continue carvedilol -controlled  LE edema and pain -venous duplex--negative    Discharge Instructions  Discharge Instructions    Diet - low sodium heart healthy    Complete by:  As directed    Increase activity slowly    Complete by:  As directed      Allergies as of 10/02/2016   No Known Allergies     Medication List    STOP taking these medications   INCRUSE ELLIPTA 62.5 MCG/INH Aepb Generic drug:  umeclidinium bromide   KLOR-CON M20 20 MEQ tablet Generic drug:  potassium chloride SA     TAKE these medications   albuterol 108 (90 Base) MCG/ACT inhaler Commonly known as:  PROVENTIL HFA;VENTOLIN HFA Inhale 1-2 puffs into the lungs every 6 (six) hours as needed for wheezing or shortness of breath.   alendronate 70 MG tablet Commonly known as:  FOSAMAX Take 1 tablet (70 mg total) by mouth once a week. Take with a full glass of water on an empty stomach.   azithromycin 250 MG tablet Commonly known as:  ZITHROMAX Take 1 tablet (250 mg total) by mouth once. On 10/03/16 Start taking on:  10/03/2016   carvedilol 3.125 MG tablet Commonly known as:  COREG TAKE 1 TABLET BY MOUTH TWICE DAILY   fluticasone furoate-vilanterol 100-25 MCG/INH Aepb Commonly known as:  BREO ELLIPTA Inhale 1 puff into the lungs daily.   furosemide 40 MG tablet Commonly known as:  LASIX TAKE 1 TABLET BY MOUTH ONCE DAILY   guaiFENesin 600 MG 12 hr tablet Commonly known as:  MUCINEX Take 1 tablet (600 mg total) by mouth 2 (  two) times daily.   M-VIT tablet Take 1 tablet by mouth daily.   metFORMIN 500 MG tablet Commonly known as:  GLUCOPHAGE Take 1 tablet (500 mg total) by mouth daily.   predniSONE 10  MG tablet Commonly known as:  DELTASONE Take 6 tablets (60 mg total) by mouth daily with breakfast. And decrease by one tablet daily Start taking on:  10/03/2016   RESTASIS MULTIDOSE 0.05 % ophthalmic emulsion Generic drug:  cycloSPORINE Place 1 drop into both eyes 2 (two) times daily.   Vitamin D3 2000 units Tabs Take 1 tablet by mouth daily.            Durable Medical Equipment        Start     Ordered   10/01/16 1036  For home use only DME oxygen  Once    Question Answer Comment  Mode or (Route) Nasal cannula   Liters per Minute 3   Frequency Continuous (stationary and portable oxygen unit needed)   Oxygen conserving device Yes   Oxygen delivery system Gas      10/01/16 1035      No Known Allergies  Consultations:  None   Procedures/Studies: US Venous Img Lower Bilateral  Result Date: 09/29/2016 CLINICAL DATA:  Lower extremity edema. EXAM: BILATERAL LOWER EXTREMITY VENOUS DOPPLER ULTRASOUND TECHNIQUE: Gray-scale sonography with graded compression, as well as color Doppler and duplex ultrasound were performed to evaluate the lower extremity deep venous systems from the level of the common femoral vein and including the common femoral, femoral, profunda femoral, popliteal and calf veins including the posterior tibial, peroneal and gastrocnemius veins when visible. The superficial great saphenous vein was also interrogated. Spectral Doppler was utilized to evaluate flow at rest and with distal augmentation maneuvers in the common femoral, femoral and popliteal veins. COMPARISON:  No recent prior. FINDINGS: RIGHT LOWER EXTREMITY Common Femoral Vein: No evidence of thrombus. Normal compressibility, respiratory phasicity and response to augmentation. Saphenofemoral Junction: No evidence of thrombus. Normal compressibility and flow on color Doppler imaging. Profunda Femoral Vein: No evidence of thrombus. Normal compressibility and flow on color Doppler imaging. Femoral Vein: No  evidence of thrombus. Normal compressibility, respiratory phasicity and response to augmentation. Popliteal Vein: No evidence of thrombus. Normal compressibility, respiratory phasicity and response to augmentation. Calf Veins: No evidence of thrombus. Normal compressibility and flow on color Doppler imaging. Superficial Great Saphenous Vein: No evidence of thrombus. Normal compressibility and flow on color Doppler imaging. Other Findings:  None. LEFT LOWER EXTREMITY Common Femoral Vein: No evidence of thrombus. Normal compressibility, respiratory phasicity and response to augmentation. Saphenofemoral Junction: No evidence of thrombus. Normal compressibility and flow on color Doppler imaging. Profunda Femoral Vein: No evidence of thrombus. Normal compressibility and flow on color Doppler imaging. Femoral Vein: No evidence of thrombus. Normal compressibility, respiratory phasicity and response to augmentation. Popliteal Vein: No evidence of thrombus. Normal compressibility, respiratory phasicity and response to augmentation. Calf Veins: No evidence of thrombus. Normal compressibility and flow on color Doppler imaging. Superficial Great Saphenous Vein: No evidence of thrombus. Normal compressibility and flow on color Doppler imaging. Other Findings:  None. IMPRESSION: No evidence of deep venous thrombosis. Electronically Signed   By: Marcello Moores  Register   On: 09/29/2016 15:16   Dg Chest Portable 1 View  Result Date: 09/28/2016 CLINICAL DATA:  Worsening dyspnea for 2 or 3 days. EXAM: PORTABLE CHEST 1 VIEW COMPARISON:  02/05/2016 FINDINGS: Hyperinflation. Mild chronic coarsening of the basilar interstitial markings. No confluent consolidation. No effusions. No  pneumothorax. Hilar and mediastinal contours are unremarkable and unchanged. IMPRESSION: COPD changes.  No consolidation or effusion. Electronically Signed   By: Andreas Newport M.D.   On: 09/28/2016 23:53        Discharge Exam: Vitals:   10/01/16 2015  10/02/16 0600  BP: (!) 133/53 (!) 121/52  Pulse: 66 64  Resp: 16 16  Temp: 98.6 F (37 C) 98.4 F (36.9 C)   Vitals:   10/01/16 2016 10/02/16 0600 10/02/16 0738 10/02/16 0812  BP:  (!) 121/52    Pulse:  64    Resp:  16    Temp:  98.4 F (36.9 C)    TempSrc:  Oral    SpO2: 95% 96%  94%  Weight:   78.6 kg (173 lb 4.8 oz)   Height:        General: Pt is alert, awake, not in acute distress Cardiovascular: RRR, S1/S2 +, no rubs, no gallops Respiratory: CTA bilaterally, no wheezing, no rhonchi Abdominal: Soft, NT, ND, bowel sounds + Extremities: no edema, no cyanosis   The results of significant diagnostics from this hospitalization (including imaging, microbiology, ancillary and laboratory) are listed below for reference.    Significant Diagnostic Studies: US Venous Img Lower Bilateral  Result Date: 09/29/2016 CLINICAL DATA:  Lower extremity edema. EXAM: BILATERAL LOWER EXTREMITY VENOUS DOPPLER ULTRASOUND TECHNIQUE: Gray-scale sonography with graded compression, as well as color Doppler and duplex ultrasound were performed to evaluate the lower extremity deep venous systems from the level of the common femoral vein and including the common femoral, femoral, profunda femoral, popliteal and calf veins including the posterior tibial, peroneal and gastrocnemius veins when visible. The superficial great saphenous vein was also interrogated. Spectral Doppler was utilized to evaluate flow at rest and with distal augmentation maneuvers in the common femoral, femoral and popliteal veins. COMPARISON:  No recent prior. FINDINGS: RIGHT LOWER EXTREMITY Common Femoral Vein: No evidence of thrombus. Normal compressibility, respiratory phasicity and response to augmentation. Saphenofemoral Junction: No evidence of thrombus. Normal compressibility and flow on color Doppler imaging. Profunda Femoral Vein: No evidence of thrombus. Normal compressibility and flow on color Doppler imaging. Femoral Vein: No  evidence of thrombus. Normal compressibility, respiratory phasicity and response to augmentation. Popliteal Vein: No evidence of thrombus. Normal compressibility, respiratory phasicity and response to augmentation. Calf Veins: No evidence of thrombus. Normal compressibility and flow on color Doppler imaging. Superficial Great Saphenous Vein: No evidence of thrombus. Normal compressibility and flow on color Doppler imaging. Other Findings:  None. LEFT LOWER EXTREMITY Common Femoral Vein: No evidence of thrombus. Normal compressibility, respiratory phasicity and response to augmentation. Saphenofemoral Junction: No evidence of thrombus. Normal compressibility and flow on color Doppler imaging. Profunda Femoral Vein: No evidence of thrombus. Normal compressibility and flow on color Doppler imaging. Femoral Vein: No evidence of thrombus. Normal compressibility, respiratory phasicity and response to augmentation. Popliteal Vein: No evidence of thrombus. Normal compressibility, respiratory phasicity and response to augmentation. Calf Veins: No evidence of thrombus. Normal compressibility and flow on color Doppler imaging. Superficial Great Saphenous Vein: No evidence of thrombus. Normal compressibility and flow on color Doppler imaging. Other Findings:  None. IMPRESSION: No evidence of deep venous thrombosis. Electronically Signed   By: Marcello Moores  Register   On: 09/29/2016 15:16   Dg Chest Portable 1 View  Result Date: 09/28/2016 CLINICAL DATA:  Worsening dyspnea for 2 or 3 days. EXAM: PORTABLE CHEST 1 VIEW COMPARISON:  02/05/2016 FINDINGS: Hyperinflation. Mild chronic coarsening of the basilar interstitial markings. No confluent  consolidation. No effusions. No pneumothorax. Hilar and mediastinal contours are unremarkable and unchanged. IMPRESSION: COPD changes.  No consolidation or effusion. Electronically Signed   By: Andreas Newport M.D.   On: 09/28/2016 23:53     Microbiology: Recent Results (from the past 240  hour(s))  Culture, blood (routine x 2)     Status: None (Preliminary result)   Collection Time: 09/29/16 12:16 AM  Result Value Ref Range Status   Specimen Description BLOOD RIGHT ANTECUBITAL  Final   Special Requests BOTTLES DRAWN AEROBIC AND ANAEROBIC 10 CC EACH  Final   Culture NO GROWTH 2 DAYS  Final   Report Status PENDING  Incomplete  Culture, blood (routine x 2)     Status: None (Preliminary result)   Collection Time: 09/29/16 12:20 AM  Result Value Ref Range Status   Specimen Description BLOOD RIGHT HAND  Final   Special Requests BOTTLES DRAWN AEROBIC AND ANAEROBIC [facility] EACH  Final   Culture PENDING  Incomplete   Report Status PENDING  Incomplete  MRSA PCR Screening     Status: None   Collection Time: 09/29/16  5:14 AM  Result Value Ref Range Status   MRSA by PCR NEGATIVE NEGATIVE Final    Comment:        The GeneXpert MRSA Assay (FDA approved for NASAL specimens only), is one component of a comprehensive MRSA colonization surveillance program. It is not intended to diagnose MRSA infection nor to guide or monitor treatment for MRSA infections.   Respiratory Panel by PCR     Status: None   Collection Time: 09/29/16  7:48 AM  Result Value Ref Range Status   Adenovirus NOT DETECTED NOT DETECTED Final   Coronavirus 229E NOT DETECTED NOT DETECTED Final   Coronavirus HKU1 NOT DETECTED NOT DETECTED Final   Coronavirus NL63 NOT DETECTED NOT DETECTED Final   Coronavirus OC43 NOT DETECTED NOT DETECTED Final   Metapneumovirus NOT DETECTED NOT DETECTED Final   Rhinovirus / Enterovirus NOT DETECTED NOT DETECTED Final   Influenza A NOT DETECTED NOT DETECTED Final   Influenza B NOT DETECTED NOT DETECTED Final   Parainfluenza Virus 1 NOT DETECTED NOT DETECTED Final   Parainfluenza Virus 2 NOT DETECTED NOT DETECTED Final   Parainfluenza Virus 3 NOT DETECTED NOT DETECTED Final   Parainfluenza Virus 4 NOT DETECTED NOT DETECTED Final   Respiratory Syncytial Virus NOT DETECTED NOT  DETECTED Final   Bordetella pertussis NOT DETECTED NOT DETECTED Final   Chlamydophila pneumoniae NOT DETECTED NOT DETECTED Final   Mycoplasma pneumoniae NOT DETECTED NOT DETECTED Final    Comment: Performed at [facility], [place] [address], [place], [place] [address]     Labs: Basic Metabolic Panel:  Recent Labs Lab 09/29/16 0015 09/30/16 0501 10/01/16 0459  NA 142 137 137  K 3.6 4.0 4.0  CL 99* 96* 98*  CO2 32 34* 33*  GLUCOSE 112* 114* 133*  BUN 13 25* 28*  CREATININE 0.73 0.82 0.67  CALCIUM 9.2 8.7* 8.6*  MG  --  2.0  --    Liver Function Tests:  Recent Labs Lab 09/29/16 0015  AST 18  ALT 14  ALKPHOS 49  BILITOT 1.0  PROT 8.7*  ALBUMIN 4.1   No results for input(s): LIPASE, AMYLASE in the last 168 hours. No results for input(s): AMMONIA in the last 168 hours. CBC:  Recent Labs Lab 09/28/16 2335 09/30/16 0501  WBC 5.8 5.3  NEUTROABS 3.1  --   HGB 13.3 11.8*  HCT 42.2 38.1  MCV 88.1 87.6  PLT 163 151   Cardiac Enzymes:  Recent Labs Lab 09/28/16 2335  TROPONINI <0.03   BNP: Invalid input(s): POCBNP CBG:  Recent Labs Lab 10/01/16 0729 10/01/16 1114 10/01/16 1630 10/01/16 2134 10/02/16 0728  GLUCAP 193* 198* 185* 202* 94    Time coordinating discharge:  Greater than 30 minutes  Signed:  Sai Moura, DO [facility] Pager: [phone] 10/02/2016, 9:42 AM

## 2016-10-01 NOTE — Care Management Important Message (Signed)
Important Message  Patient Details  Name: Sonya Oliver MRN: [id number] Date of Birth: 1940/12/24   Medicare Important Message Given:  Yes    Sherald Barge, RN 10/01/2016, 12:48 PM

## 2016-10-01 NOTE — Progress Notes (Signed)
OXYGEN QUALIFICATION NOTE  OXYGEN SATURATION ON ROOM AIR BEFORE AMBULATION--90%  OXYGEN SATURATION ON ROOM AIR WITH AMBULATION--86%  OXYGEN SATURATION ON ROOM AIR AFTER AMBULATION--88%  OXYGEN SATURATION ON 3L AFTER AMBULATION AT REST--95 %  DTAT

## 2016-10-02 LAB — GLUCOSE, CAPILLARY
Glucose-Capillary: 94 mg/dL (ref 65–99)
Glucose-Capillary: 131 mg/dL — ABNORMAL HIGH (ref 65–99)

## 2016-10-02 MED ORDER — PREDNISONE 10 MG PO TABS: 60.0000 mg | ORAL_TABLET | Freq: Every day | ORAL | 0 refills | Status: DC

## 2016-10-02 MED ORDER — IPRATROPIUM-ALBUTEROL 0.5-2.5 (3) MG/3ML IN SOLN: 3.0000 mL | Freq: Two times a day (BID) | RESPIRATORY_TRACT | Status: DC

## 2016-10-02 NOTE — Progress Notes (Addendum)
Upon assessment, pt IVs are not present  18- Spoke with Dr Tat, no need for new IV at this time.  Plan is to discharge today. Will continue to monitor.

## 2016-10-02 NOTE — Progress Notes (Signed)
Pt telemetry removed.  Reviewed discharge instructions with pt and family at bedside.  Answered questions at this time.

## 2016-10-04 LAB — CULTURE, BLOOD (ROUTINE X 2): Culture: NO GROWTH

## 2016-10-12 LAB — CULTURE, BLOOD (ROUTINE X 2): Culture: NO GROWTH

## 2016-11-11 ENCOUNTER — Ambulatory Visit: Payer: Medicare Other | Admitting: Family Medicine

## 2016-12-02 ENCOUNTER — Emergency Department (HOSPITAL_COMMUNITY): Payer: Medicare Other

## 2016-12-02 ENCOUNTER — Inpatient Hospital Stay (HOSPITAL_COMMUNITY)
Admission: EM | Admit: 2016-12-02 | Discharge: 2016-12-05 | DRG: 190 | Disposition: A | Discharge status: Home / Self Care | Payer: Medicare Other | Attending: Internal Medicine | Admitting: Internal Medicine

## 2016-12-02 ENCOUNTER — Encounter (HOSPITAL_COMMUNITY): Payer: Self-pay

## 2016-12-02 DIAGNOSIS — I5033 Acute on chronic diastolic (congestive) heart failure: Secondary | ICD-10-CM | POA: Diagnosis present

## 2016-12-02 DIAGNOSIS — J9601 Acute respiratory failure with hypoxia: Secondary | ICD-10-CM

## 2016-12-02 DIAGNOSIS — R0602 Shortness of breath: Secondary | ICD-10-CM

## 2016-12-02 DIAGNOSIS — Z833 Family history of diabetes mellitus: Secondary | ICD-10-CM

## 2016-12-02 DIAGNOSIS — E119 Type 2 diabetes mellitus without complications: Secondary | ICD-10-CM

## 2016-12-02 DIAGNOSIS — Z79899 Other long term (current) drug therapy: Secondary | ICD-10-CM

## 2016-12-02 DIAGNOSIS — Z8673 Personal history of transient ischemic attack (TIA), and cerebral infarction without residual deficits: Secondary | ICD-10-CM

## 2016-12-02 DIAGNOSIS — Z87891 Personal history of nicotine dependence: Secondary | ICD-10-CM

## 2016-12-02 DIAGNOSIS — J441 Chronic obstructive pulmonary disease with (acute) exacerbation: Principal | ICD-10-CM | POA: Diagnosis present

## 2016-12-02 DIAGNOSIS — J9602 Acute respiratory failure with hypercapnia: Secondary | ICD-10-CM | POA: Diagnosis present

## 2016-12-02 DIAGNOSIS — J810 Acute pulmonary edema: Secondary | ICD-10-CM

## 2016-12-02 DIAGNOSIS — M810 Age-related osteoporosis without current pathological fracture: Secondary | ICD-10-CM | POA: Diagnosis present

## 2016-12-02 DIAGNOSIS — Z90710 Acquired absence of both cervix and uterus: Secondary | ICD-10-CM

## 2016-12-02 DIAGNOSIS — Z841 Family history of disorders of kidney and ureter: Secondary | ICD-10-CM

## 2016-12-02 DIAGNOSIS — Z7984 Long term (current) use of oral hypoglycemic drugs: Secondary | ICD-10-CM

## 2016-12-02 DIAGNOSIS — J9621 Acute and chronic respiratory failure with hypoxia: Secondary | ICD-10-CM | POA: Diagnosis present

## 2016-12-02 DIAGNOSIS — J9691 Respiratory failure, unspecified with hypoxia: Secondary | ICD-10-CM | POA: Diagnosis present

## 2016-12-02 DIAGNOSIS — J189 Pneumonia, unspecified organism: Secondary | ICD-10-CM | POA: Diagnosis present

## 2016-12-02 DIAGNOSIS — Y95 Nosocomial condition: Secondary | ICD-10-CM | POA: Diagnosis present

## 2016-12-02 DIAGNOSIS — Z8249 Family history of ischemic heart disease and other diseases of the circulatory system: Secondary | ICD-10-CM

## 2016-12-02 DIAGNOSIS — J440 Chronic obstructive pulmonary disease with acute lower respiratory infection: Secondary | ICD-10-CM | POA: Diagnosis present

## 2016-12-02 DIAGNOSIS — J9622 Acute and chronic respiratory failure with hypercapnia: Secondary | ICD-10-CM | POA: Diagnosis present

## 2016-12-02 DIAGNOSIS — G9341 Metabolic encephalopathy: Secondary | ICD-10-CM | POA: Diagnosis present

## 2016-12-02 DIAGNOSIS — Z66 Do not resuscitate: Secondary | ICD-10-CM | POA: Diagnosis present

## 2016-12-02 DIAGNOSIS — J9692 Respiratory failure, unspecified with hypercapnia: Secondary | ICD-10-CM

## 2016-12-02 DIAGNOSIS — Z825 Family history of asthma and other chronic lower respiratory diseases: Secondary | ICD-10-CM

## 2016-12-02 DIAGNOSIS — I110 Hypertensive heart disease with heart failure: Secondary | ICD-10-CM | POA: Diagnosis present

## 2016-12-02 DIAGNOSIS — Z9981 Dependence on supplemental oxygen: Secondary | ICD-10-CM

## 2016-12-02 LAB — CBC WITH DIFFERENTIAL/PLATELET
BASOS PCT: 0 %
Basophils Absolute: 0 10*3/uL (ref 0.0–0.1)
Eosinophils Absolute: 0.5 10*3/uL (ref 0.0–0.7)
Eosinophils Relative: 10 %
HCT: 44 % (ref 36.0–46.0)
HEMOGLOBIN: 14 g/dL (ref 12.0–15.0)
Lymphocytes Relative: 35 %
Lymphs Abs: 1.8 10*3/uL (ref 0.7–4.0)
MCH: 28.2 pg (ref 26.0–34.0)
MCHC: 31.8 g/dL (ref 30.0–36.0)
MCV: 88.5 fL (ref 78.0–100.0)
Monocytes Absolute: 0.5 10*3/uL (ref 0.1–1.0)
Monocytes Relative: 10 %
NEUTROS PCT: 45 %
Neutro Abs: 2.4 10*3/uL (ref 1.7–7.7)
Platelets: 191 10*3/uL (ref 150–400)
RBC: 4.97 MIL/uL (ref 3.87–5.11)
RDW: 14.6 % (ref 11.5–15.5)
WBC: 5.2 10*3/uL (ref 4.0–10.5)

## 2016-12-02 LAB — BLOOD GAS, ARTERIAL
Acid-Base Excess: 4.8 mmol/L — ABNORMAL HIGH (ref 0.0–2.0)
BICARBONATE: 26.7 mmol/L (ref 20.0–28.0)
DRAWN BY: 21310
Delivery systems: POSITIVE
Expiratory PAP: 6
FIO2: 50
INSPIRATORY PAP: 14
O2 Saturation: 98.6 %
Patient temperature: 37
pCO2 arterial: 71.3 mmHg (ref 32.0–48.0)
pH, Arterial: 7.266 — ABNORMAL LOW (ref 7.350–7.450)
pO2, Arterial: 157 mmHg — ABNORMAL HIGH (ref 83.0–108.0)

## 2016-12-02 LAB — TROPONIN I: Troponin I: <0.03 ng/mL (ref <0.03)

## 2016-12-02 LAB — COMPREHENSIVE METABOLIC PANEL
ALBUMIN: 4.4 g/dL (ref 3.5–5.0)
ALT: 10 U/L — AB (ref 14–54)
AST: 20 U/L (ref 15–41)
Alkaline Phosphatase: 56 U/L (ref 38–126)
Anion gap: 11 (ref 5–15)
BUN: 12 mg/dL (ref 6–20)
CALCIUM: 9.5 mg/dL (ref 8.9–10.3)
CO2: 31 mmol/L (ref 22–32)
CREATININE: 0.65 mg/dL (ref 0.44–1.00)
Chloride: 96 mmol/L — ABNORMAL LOW (ref 101–111)
GFR calc Af Amer: >60 mL/min (ref >60)
GFR calc non Af Amer: >60 mL/min (ref >60)
GLUCOSE: 131 mg/dL — AB (ref 65–99)
Potassium: 4 mmol/L (ref 3.5–5.1)
SODIUM: 138 mmol/L (ref 135–145)
Total Bilirubin: 0.9 mg/dL (ref 0.3–1.2)
Total Protein: 9.1 g/dL — ABNORMAL HIGH (ref 6.5–8.1)

## 2016-12-02 LAB — BRAIN NATRIURETIC PEPTIDE: B Natriuretic Peptide: 92 pg/mL (ref 0.0–100.0)

## 2016-12-02 MED ORDER — ALBUTEROL SULFATE (2.5 MG/3ML) 0.083% IN NEBU
2.5000 mg | INHALATION_SOLUTION | Freq: Once | RESPIRATORY_TRACT | Status: AC
  Administered 2016-12-02: 2.5 mg via RESPIRATORY_TRACT
  Filled 2016-12-02: qty 3

## 2016-12-02 MED ORDER — VANCOMYCIN HCL IN DEXTROSE 1-5 GM/200ML-% IV SOLN
1000.0000 mg | Freq: Once | INTRAVENOUS | Status: AC
  Administered 2016-12-02: 1000 mg via INTRAVENOUS
  Filled 2016-12-02: qty 200

## 2016-12-02 MED ORDER — FUROSEMIDE 10 MG/ML IJ SOLN
80.0000 mg | Freq: Once | INTRAMUSCULAR | Status: AC
  Administered 2016-12-02: 80 mg via INTRAVENOUS
  Filled 2016-12-02: qty 8

## 2016-12-02 MED ORDER — IPRATROPIUM-ALBUTEROL 0.5-2.5 (3) MG/3ML IN SOLN
3.0000 mL | Freq: Once | RESPIRATORY_TRACT | Status: AC
  Administered 2016-12-02: 3 mL via RESPIRATORY_TRACT
  Filled 2016-12-02: qty 3

## 2016-12-02 MED ORDER — PIPERACILLIN-TAZOBACTAM 3.375 G IVPB 30 MIN
3.3750 g | Freq: Once | INTRAVENOUS | Status: AC
  Administered 2016-12-02: 3.375 g via INTRAVENOUS
  Filled 2016-12-02: qty 50

## 2016-12-02 NOTE — ED Triage Notes (Signed)
Hx of copd, sob for several days at home.  Pt was given 5 albuterol and 0.5 atrovent nebs and 125 solumedrol

## 2016-12-02 NOTE — ED Notes (Signed)
ED Provider at bedside. 

## 2016-12-02 NOTE — ED Provider Notes (Signed)
[facility] DEPT Provider Note   CSN: [id number] Arrival date & time: 12/02/16  2223  By signing my name below, I, Sonya Oliver, attest that this documentation has been prepared under the direction and in the presence of Forde Dandy, MD. Electronically Signed: Collene Oliver, Scribe. 12/02/16. 10:54 PM.  History   Chief Complaint Chief Complaint  Patient presents with  . Shortness of Breath    LEVEL 5 CAVEAT DUE TO MENTAL STATUS  HPI Comments: Sonya Oliver is a 76 y.o. female with a history of COPD, asthma, pulmonary edema, DM, and CHF, who presents to the Emergency Department complaining of sudden-onset shortness of breath that began a few days ago. Per daughter-in-law bedside the patient is currently unresponsive and in respiratory distress, this episode began at 10PM. Per daughter-in-law the patient has never had an episode this bad. Patient has been coughing in the mornings recently. While en route the patient was given '5mg'$  albuterol, 0.'5mg'$  Atrovent, and '125mg'$  of solumedrol. Per daughter-in-law the patient is on chronic 3L of oxygen at home.   HPI  Past Medical History:  Diagnosis Date  . Arthritis   . Bladder tumor   . Cancer ([place])    bladder  . Cataract   . CHF (congestive heart failure) ([place])    NO CARDIOLOGIST---  MONITORED BY PCP DR Jacelyn Grip  . COPD with emphysema ([place])    last Acute Exacerbation 08-21-2014  . Dyspnea on exertion   . H/O pulmonary edema    jan 2014--  acute CHF w/ pulmonary edema  . Hypertension   . Hyperthyroidism    currently no meds -- labs stable  . Nocturia   . Nocturnal oxygen desaturation    uses O2  HS via [place]--  and PRN daytime  . Osteoporosis   . Stroke ([place])   . Type 2 diabetes mellitus ([place])   . Ureteral tumor     Patient Active Problem List   Diagnosis Date Noted  . Respiratory failure with hypoxia and hypercapnia ([place]) 09/29/2016  . Chronic diastolic CHF (congestive heart failure) ([place]) 09/29/2016  . History of smoking  25-50 pack years 07/20/2016  . Aortic atherosclerosis ([place]) 07/20/2016  . Osteoporosis 07/08/2016  . COPD exacerbation ([place]) 02/05/2016  . Hypoxia 12/14/2015  . Diabetes mellitus, type 2 ([place]) 12/14/2015  . Lobar pneumonia due to unspecified organism 12/14/2015  . Acute respiratory failure with hypoxia ([place]) 12/14/2015  . Acute respiratory failure with hypercapnia ([place]) 12/14/2015  . Hypokalemia 08/22/2013  . Acute on chronic diastolic heart failure ([place]) 06/20/2013  . Acute-on-chronic respiratory failure ([place]) 06/20/2013  . CHF (congestive heart failure) ([place]) 12/19/2012  . COPD (chronic obstructive pulmonary disease) ([place]) 12/19/2012  . Unspecified vitamin D deficiency 12/19/2012  . HTN (hypertension) 12/19/2012  . SOB (shortness of breath) 09/12/2012  . Edema leg 09/12/2012  . Diabetes ([place]) 09/12/2012  . Anemia 03/21/2012  . Syncope 03/21/2012  . Hematuria, gross 02/23/2012  . Anemia associated with acute blood loss 02/23/2012  . Orthostatic hypotension 02/23/2012  . Bladder mass 02/23/2012  . Hyperthyroidism 02/23/2012    Past Surgical History:  Procedure Laterality Date  . ABDOMINAL HYSTERECTOMY  1980'S   W/ BILATERAL SALPINGO-OOPHORECTOMY  . CATARACT EXTRACTION W/PHACO Left 01/31/2014   Procedure: CATARACT EXTRACTION PHACO AND INTRAOCULAR LENS PLACEMENT (IOC);  Surgeon: Tonny Branch, MD;  Location: [facility];  Service: Ophthalmology;  Laterality: Left;  CDE: 23.78  . CATARACT EXTRACTION W/PHACO Right 02/28/2014   Procedure: CATARACT EXTRACTION PHACO AND INTRAOCULAR LENS PLACEMENT (IOC);  Surgeon: Tonny Branch, MD;  Location: [facility];  Service: Ophthalmology;  Laterality: Right;  CDE:  11.34  . CYSTOSCOPY  03/23/2012   Procedure: CYSTOSCOPY;  Surgeon: Claybon Jabs, MD;  Location: [facility];  Service: Urology;  Laterality: N/A;  . CYSTOSCOPY WITH RETROGRADE PYELOGRAM, URETEROSCOPY AND STENT PLACEMENT Right 10/21/2014   Procedure: CYSTOSCOPY WITH RETROGRADE PYELOGRAM, URETEROSCOPY,  BLADDER TUMOR BIOPSY;  Surgeon: Claybon Jabs, MD;  Location: [facility];  Service: Urology;  Laterality: Right;  . TRANSTHORACIC ECHOCARDIOGRAM  06-21-2013   moderate LVH,  grade I diastolic dysfunction, ef 26-37%,  mild MR & TR  . TRANSURETHRAL RESECTION OF BLADDER TUMOR  03/23/2012   Procedure: TRANSURETHRAL RESECTION OF BLADDER TUMOR (TURBT);  Surgeon: Claybon Jabs, MD;  Location: [facility];  Service: Urology;  Laterality: N/A;  . TRANSURETHRAL RESECTION OF BLADDER TUMOR N/A 01/08/2013   Procedure: TRANSURETHRAL RESECTION OF BLADDER TUMOR (TURBT);  Surgeon: Claybon Jabs, MD;  Location: [facility];  Service: Urology;  Laterality: N/A;    OB History    No data available       Home Medications    Prior to Admission medications   Medication Sig Start Date End Date Taking? Authorizing Provider  albuterol (PROVENTIL HFA;VENTOLIN HFA) 108 (90 Base) MCG/ACT inhaler Inhale 1-2 puffs into the lungs every 6 (six) hours as needed for wheezing or shortness of breath. 04/07/16   Wardell Honour, MD  alendronate (FOSAMAX) 70 MG tablet Take 1 tablet (70 mg total) by mouth once a week. Take with a full glass of water on an empty stomach. 07/08/16   Cherre Robins, PharmD  carvedilol (COREG) 3.125 MG tablet TAKE 1 TABLET BY MOUTH TWICE DAILY 05/10/16   Wardell Honour, MD  Cholecalciferol (VITAMIN D3) 2000 UNITS TABS Take 1 tablet by mouth daily.    Historical Provider, MD  fluticasone furoate-vilanterol (BREO ELLIPTA) 100-25 MCG/INH AEPB Inhale 1 puff into the lungs daily. 02/27/16   Wardell Honour, MD  furosemide (LASIX) 40 MG tablet TAKE 1 TABLET BY MOUTH ONCE DAILY 06/28/16   Wardell Honour, MD  guaiFENesin (MUCINEX) 600 MG 12 hr tablet Take 1 tablet (600 mg total) by mouth 2 (two) times daily. 12/18/15   Kathie Dike, MD  metFORMIN (GLUCOPHAGE) 500 MG tablet Take 1 tablet (500 mg total) by mouth daily. 06/28/16   Wardell Honour, MD  predniSONE (DELTASONE) 10 MG  tablet Take 6 tablets (60 mg total) by mouth daily with breakfast. And decrease by one tablet daily 10/03/16   Orson Eva, MD  Prenatal Vit-Fe Fumarate-FA (M-VIT) tablet Take 1 tablet by mouth daily.    Historical Provider, MD  RESTASIS MULTIDOSE 0.05 % ophthalmic emulsion Place 1 drop into both eyes 2 (two) times daily. 01/19/16   Historical Provider, MD    Family History Family History  Problem Relation Age of Onset  . Diabetes Father   . Congestive Heart Failure Father   . COPD Father   . Kidney disease Father   . Hypertension Father   . Diabetes Mother     with kidney disease  . Kidney disease Mother   . Heart attack Brother   . Asthma Brother   . Diabetes Daughter   . Diabetes Son   . Cancer Brother     throat  . Hypertension Brother     Social History Social History  Substance Use Topics  . Smoking status: Former Smoker    Packs/day: 1.00  Years: 50.00    Types: Cigarettes    Quit date: 09/24/2006  . Smokeless tobacco: Never Used  . Alcohol use No     Allergies   Patient has no known allergies.   Review of Systems Review of Systems  Unable to perform ROS due to altered mental status.   Physical Exam Updated Vital Signs BP 126/73   Pulse 96   Temp 98.2 F (36.8 C) (Oral)   Resp 18   SpO2 99%   Physical Exam  Physical Exam  Nursing note and vitals reviewed. Constitutional: Acutely ill appearing in respiratory distress minimally responsive Head: Normocephalic and atraumatic.  Mouth/Throat: Oropharynx is clear and moist.  Neck: Normal range of motion. Neck supple.  Cardiovascular: Normal rate and regular rhythm.   Pulmonary/Chest: Poor air movement throughout with faint expiratory wheezing. Respiratory distress Abdominal: Soft. There is no tenderness. There is no rebound and no guarding.  Musculoskeletal: Normal range of motion. +1 pitting edema of the lower extremities.  Neurological: Minimally responsive (but will open eyes) to voice or sternal  rub Skin: Skin is warm and dry.  Psychiatric: Cooperative.  ED Treatments / Results  DIAGNOSTIC STUDIES: Oxygen Saturation is 99% on [place], normal by my interpretation.    COORDINATION OF CARE: 10:53 PM Discussed treatment plan with pt at bedside and pt agreed to plan, which includes imaging and a nasal canula.   Labs (all labs ordered are listed, but only abnormal results are displayed) Labs Reviewed  BLOOD GAS, ARTERIAL - Abnormal; Notable for the following:       Result Value   pH, Arterial 7.266 (*)    pCO2 arterial 71.3 (*)    pO2, Arterial 157.0 (*)    Acid-Base Excess 4.8 (*)    All other components within normal limits  COMPREHENSIVE METABOLIC PANEL - Abnormal; Notable for the following:    Chloride 96 (*)    Glucose, Bld 131 (*)    Total Protein 9.1 (*)    ALT 10 (*)    All other components within normal limits  URINALYSIS, ROUTINE W REFLEX MICROSCOPIC - Abnormal; Notable for the following:    Color, Urine STRAW (*)    All other components within normal limits  BLOOD GAS, ARTERIAL - Abnormal; Notable for the following:    pH, Arterial 7.316 (*)    pCO2 arterial 61.6 (*)    pO2, Arterial 73.3 (*)    Acid-Base Excess 4.8 (*)    All other components within normal limits  CBC WITH DIFFERENTIAL/PLATELET  TROPONIN I  BRAIN NATRIURETIC PEPTIDE    EKG  EKG Interpretation  Date/Time:  Thursday December 02 2016 22:30:49 EDT Ventricular Rate:  101 PR Interval:    QRS Duration: 111 QT Interval:  344 QTC Calculation: 446 R Axis:   98 Text Interpretation:  Sinus tachycardia Multiform ventricular premature complexes Right axis deviation Probable anteroseptal infarct, old Baseline wander in lead(s) V3 no acute changes  Confirmed by Atticus Lemberger MD, Kaleah Hagemeister [phone]) on 12/02/2016 11:00:25 PM       Radiology Dg Chest Portable 1 View  Result Date: 12/02/2016 CLINICAL DATA:  Dyspnea for several days at home. EXAM: PORTABLE CHEST 1 VIEW COMPARISON:  09/28/2016 FINDINGS: Hyperinflation and  cardiomegaly. Interstitial fluid or edema. Patchy opacities in the central and basilar regions, right greater than left. This may represent asymmetric alveolar edema. Cannot exclude pneumonia. No large effusions. Upper lobe emphysematous changes. IMPRESSION: Central and basilar opacities, right greater than left, alveolar edema versus infectious infiltrate. There also  is mild interstitial prominence and this could all represent CHF superimposed on COPD. Electronically Signed   By: Andreas Newport M.D.   On: 12/02/2016 23:15    Procedures Procedures (including critical care time) CRITICAL CARE Performed by: Forde Dandy   Total critical care time: 45 minutes  Critical care time was exclusive of separately billable procedures and treating other patients.  Critical care was necessary to treat or prevent imminent or life-threatening deterioration.  Critical care was time spent personally by me on the following activities: development of treatment plan with patient and/or surrogate as well as nursing, discussions with consultants, evaluation of patient's response to treatment, examination of patient, obtaining history from patient or surrogate, ordering and performing treatments and interventions, ordering and review of laboratory studies, ordering and review of radiographic studies, pulse oximetry and re-evaluation of patient's condition.  Medications Ordered in ED Medications  vancomycin (VANCOCIN) IVPB 1000 mg/200 mL premix (1,000 mg Intravenous New Bag/Given 12/02/16 2353)  ipratropium-albuterol (DUONEB) 0.5-2.5 (3) MG/3ML nebulizer solution 3 mL (3 mLs Nebulization Given 12/02/16 2254)  albuterol (PROVENTIL) (2.5 MG/3ML) 0.083% nebulizer solution 2.5 mg (2.5 mg Nebulization Given 12/02/16 2254)  piperacillin-tazobactam (ZOSYN) IVPB 3.375 g (0 g Intravenous Stopped 12/03/16 0034)  furosemide (LASIX) injection 80 mg (80 mg Intravenous Given 12/02/16 2353)     Initial Impression / Assessment and Plan  / ED Course  I have reviewed the triage vital signs and the nursing notes.  Pertinent labs & imaging results that were available during my care of the patient were reviewed by me and considered in my medical decision making (see chart for details).     76 year old female who presents with respiratory distress. Significant respiratory distress on presentation and minimally responsive. Placed on BiPAP.   Poor air movement throughout with some expiratory wheeze. Does have some edema lower extremities. Very hypertensive initially with systolic blood pressures of 200s over 100s, and concern for pulmonary edema. However with BiPAP blood pressure improves to 120s to 130s. Chest x-ray visualized and reviewed with radiology. Evidence of pulmonary edema versus infiltrate that could be pneumonia. A higher suspicion for pulmonary edema, but given questionable infiltrate started on vancomycin and Zosyn for healthcare associated pneumonia she was recently hospitalized at the end of January. 80 mg of Lasix also given. Given blood pressures in the 120s, unlikely to benefit from nitroglycerin drip.  Her VBG is concerning for hypercapnia with a PCO2 of 71. This is likely why she was minimally responsive on arrival. However on BiPAP, she has become more and more responsive, and I do feel that she is improving and likely not requiring intubation.  Discussed with Dr. Darrick Meigs will admitted to stepdown    Final Clinical Impressions(s) / ED Diagnoses   Final diagnoses:  Acute respiratory failure with hypoxia and hypercapnia ([place])  Acute pulmonary edema ([place])  HCAP (healthcare-associated pneumonia)    New Prescriptions New Prescriptions   No medications on file   I personally performed the services described in this documentation, which was scribed in my presence. The recorded information has been reviewed and is accurate.'     Forde Dandy, MD 12/03/16 [phone]

## 2016-12-02 NOTE — ED Notes (Signed)
CRITICAL VALUE ALERT  Critical value received:  Ph 7.266, CO2 71.3, O2 157.0, Sat 98.6, Bicarb 26.7  Date of notification:  12/02/2016  Time of notification:  13:29  Critical value read back: yes  Nurse who received alert:  Rip Harbour RN   MD notified (1st page):  Dr Viviana Simpler  Time of first page:  23:30 MD notified (2nd page):  Time of second page:  Responding MD:  Dr Viviana Simpler  Time MD responded:  23:30

## 2016-12-03 ENCOUNTER — Observation Stay (HOSPITAL_COMMUNITY): Payer: Medicare Other

## 2016-12-03 ENCOUNTER — Observation Stay (HOSPITAL_BASED_OUTPATIENT_CLINIC_OR_DEPARTMENT_OTHER): Payer: Medicare Other

## 2016-12-03 DIAGNOSIS — J9602 Acute respiratory failure with hypercapnia: Secondary | ICD-10-CM | POA: Diagnosis not present

## 2016-12-03 DIAGNOSIS — I509 Heart failure, unspecified: Secondary | ICD-10-CM | POA: Diagnosis not present

## 2016-12-03 DIAGNOSIS — J189 Pneumonia, unspecified organism: Secondary | ICD-10-CM | POA: Diagnosis not present

## 2016-12-03 DIAGNOSIS — J9601 Acute respiratory failure with hypoxia: Secondary | ICD-10-CM

## 2016-12-03 DIAGNOSIS — J810 Acute pulmonary edema: Secondary | ICD-10-CM | POA: Diagnosis not present

## 2016-12-03 LAB — ECHOCARDIOGRAM COMPLETE
AOVTI: 48.7 cm
AV Area VTI: 1.4 cm2
AV Area mean vel: 1.25 cm2
AV VEL mean LVOT/AV: 0.49
AV area mean vel ind: 0.67 cm2/m2
AV peak Index: 0.75
AV vel: 1.33
AVAREAVTIIND: 0.71 cm2/m2
AVG: 9 mmHg
AVLVOTPG: 5 mmHg
AVPG: 17 mmHg
AVPKVEL: 205 cm/s
Ao pk vel: 0.55 m/s
CHL CUP AV VALUE AREA INDEX: 0.71
CHL CUP MV DEC (S): 203
CHL CUP RV SYS PRESS: 31 mmHg
CHL CUP STROKE VOLUME: 37 mL
DOP CAL AO MEAN VELOCITY: 143 cm/s
E/e' ratio: 12.81
EWDT: 203 ms
FS: 38 % (ref 28–44)
Height: 63 in
IV/PV OW: 1.1
LA diam end sys: 32 mm
LA vol: 61.7 mL
LADIAMINDEX: 1.71 cm/m2
LASIZE: 32 mm
LAVOLA4C: 78.8 mL
LAVOLIN: 32.9 mL/m2
LV E/e' medial: 12.81
LV E/e'average: 12.81
LV TDI E'LATERAL: 5.33
LV TDI E'MEDIAL: 5.55
LV sys vol index: 12 mL/m2
LV sys vol: 22 mL
LVDIAVOL: 60 mL (ref 46–106)
LVDIAVOLIN: 32 mL/m2
LVELAT: 5.33 cm/s
LVOT VTI: 25.5 cm
LVOT area: 2.54 cm2
LVOT diameter: 18 mm
LVOT peak VTI: 0.52 cm
LVOT peak vel: 113 cm/s
LVOTSV: 65 mL
Lateral S' vel: 15.3 cm/s
MV pk A vel: 106 m/s
MVPKEVEL: 68.3 m/s
PW: 11.9 mm — AB (ref 0.6–1.1)
Reg peak vel: 266 cm/s
Simpson's disk: 63
TAPSE: 22.3 mm
TR max vel: 266 cm/s
Valve area: 1.33 cm2
Weight: 2712.54 oz

## 2016-12-03 LAB — BLOOD GAS, ARTERIAL
Acid-Base Excess: 4.8 mmol/L — ABNORMAL HIGH (ref 0.0–2.0)
Bicarbonate: 27.2 mmol/L (ref 20.0–28.0)
DELIVERY SYSTEMS: POSITIVE
Drawn by: 21310
Expiratory PAP: 6
FIO2: 35
INSPIRATORY PAP: 14
O2 Saturation: 93.3 %
PCO2 ART: 61.6 mmHg — AB (ref 32.0–48.0)
PH ART: 7.316 — AB (ref 7.350–7.450)
Patient temperature: 37
pO2, Arterial: 73.3 mmHg — ABNORMAL LOW (ref 83.0–108.0)

## 2016-12-03 LAB — URINALYSIS, ROUTINE W REFLEX MICROSCOPIC
BILIRUBIN URINE: NEGATIVE
Glucose, UA: NEGATIVE mg/dL
HGB URINE DIPSTICK: NEGATIVE
Ketones, ur: NEGATIVE mg/dL
Leukocytes, UA: NEGATIVE
Nitrite: NEGATIVE
PROTEIN: NEGATIVE mg/dL
Specific Gravity, Urine: 1.005 (ref 1.005–1.030)
pH: 6 (ref 5.0–8.0)

## 2016-12-03 LAB — GLUCOSE, CAPILLARY
GLUCOSE-CAPILLARY: 128 mg/dL — AB (ref 65–99)
GLUCOSE-CAPILLARY: 147 mg/dL — AB (ref 65–99)
Glucose-Capillary: 137 mg/dL — ABNORMAL HIGH (ref 65–99)
Glucose-Capillary: 157 mg/dL — ABNORMAL HIGH (ref 65–99)

## 2016-12-03 LAB — MRSA PCR SCREENING: MRSA by PCR: NEGATIVE

## 2016-12-03 MED ORDER — FLUTICASONE FUROATE-VILANTEROL 100-25 MCG/INH IN AEPB
1.0000 | INHALATION_SPRAY | Freq: Every day | RESPIRATORY_TRACT | Status: DC
  Administered 2016-12-03 – 2016-12-05 (×3): 1 via RESPIRATORY_TRACT
  Filled 2016-12-03: qty 28

## 2016-12-03 MED ORDER — ORAL CARE MOUTH RINSE
15.0000 mL | Freq: Two times a day (BID) | OROMUCOSAL | Status: DC
  Administered 2016-12-03 (×2): 15 mL via OROMUCOSAL

## 2016-12-03 MED ORDER — IPRATROPIUM-ALBUTEROL 0.5-2.5 (3) MG/3ML IN SOLN
3.0000 mL | Freq: Four times a day (QID) | RESPIRATORY_TRACT | Status: DC
Start: 2016-12-03 — End: 2016-12-04
  Administered 2016-12-03 – 2016-12-04 (×7): 3 mL via RESPIRATORY_TRACT
  Filled 2016-12-03 (×7): qty 3

## 2016-12-03 MED ORDER — INSULIN ASPART 100 UNIT/ML ~~LOC~~ SOLN
0.0000 [IU] | Freq: Three times a day (TID) | SUBCUTANEOUS | Status: DC
  Administered 2016-12-03 (×3): 1 [IU] via SUBCUTANEOUS

## 2016-12-03 MED ORDER — METHYLPREDNISOLONE SODIUM SUCC 125 MG IJ SOLR
80.0000 mg | Freq: Three times a day (TID) | INTRAMUSCULAR | Status: DC
Start: 2016-12-03 — End: 2016-12-04
  Administered 2016-12-03 (×3): 80 mg via INTRAVENOUS
  Filled 2016-12-03 (×4): qty 2

## 2016-12-03 MED ORDER — ALBUTEROL SULFATE (2.5 MG/3ML) 0.083% IN NEBU: 2.5000 mg | INHALATION_SOLUTION | RESPIRATORY_TRACT | Status: DC | PRN

## 2016-12-03 MED ORDER — ENOXAPARIN SODIUM 40 MG/0.4ML ~~LOC~~ SOLN
40.0000 mg | SUBCUTANEOUS | Status: DC
  Administered 2016-12-03 – 2016-12-04 (×2): 40 mg via SUBCUTANEOUS
  Filled 2016-12-03 (×2): qty 0.4

## 2016-12-03 MED ORDER — CARVEDILOL 3.125 MG PO TABS
3.1250 mg | ORAL_TABLET | Freq: Two times a day (BID) | ORAL | Status: DC
  Administered 2016-12-03 – 2016-12-05 (×4): 3.125 mg via ORAL
  Filled 2016-12-03 (×5): qty 1

## 2016-12-03 MED ORDER — METHYLPREDNISOLONE SODIUM SUCC 125 MG IJ SOLR
60.0000 mg | Freq: Four times a day (QID) | INTRAMUSCULAR | Status: DC
  Filled 2016-12-03: qty 2

## 2016-12-03 MED ORDER — CYCLOSPORINE 0.05 % OP EMUL
1.0000 [drp] | Freq: Two times a day (BID) | OPHTHALMIC | Status: DC
  Administered 2016-12-03 – 2016-12-04 (×4): 1 [drp] via OPHTHALMIC
  Filled 2016-12-03 (×7): qty 1

## 2016-12-03 MED ORDER — GUAIFENESIN ER 600 MG PO TB12
600.0000 mg | ORAL_TABLET | Freq: Two times a day (BID) | ORAL | Status: DC
  Administered 2016-12-03 – 2016-12-05 (×5): 600 mg via ORAL
  Filled 2016-12-03 (×5): qty 1

## 2016-12-03 MED ORDER — FUROSEMIDE 10 MG/ML IJ SOLN
40.0000 mg | Freq: Two times a day (BID) | INTRAMUSCULAR | Status: DC
  Administered 2016-12-03 – 2016-12-05 (×5): 40 mg via INTRAVENOUS
  Filled 2016-12-03 (×5): qty 4

## 2016-12-03 MED ORDER — CHLORHEXIDINE GLUCONATE 0.12 % MT SOLN
15.0000 mL | Freq: Two times a day (BID) | OROMUCOSAL | Status: DC
  Administered 2016-12-03 – 2016-12-04 (×4): 15 mL via OROMUCOSAL
  Filled 2016-12-03 (×3): qty 15

## 2016-12-03 MED ORDER — METHYLPREDNISOLONE SODIUM SUCC 125 MG IJ SOLR
60.0000 mg | Freq: Four times a day (QID) | INTRAMUSCULAR | Status: DC
  Administered 2016-12-03: 60 mg via INTRAVENOUS

## 2016-12-03 NOTE — Progress Notes (Signed)
PROGRESS NOTE                                                                                                                                                                                                             Patient Demographics:    Sonya Oliver, is a 76 y.o. female, DOB - 12/08/1940, WGN:[id number]  Admit date - 12/02/2016   Admitting Physician Oswald Hillock, MD  Outpatient Primary MD for the patient is MILLER, Lillette Boxer, MD  LOS - 0  Chief Complaint  Patient presents with  . Shortness of Breath       Brief Narrative   76 year old African-American female with history of unspecified chronic CHF no echo in file, COPD on 3 L nasal cannula oxygen at home, hypertension, hypothyroidism,  past history of stroke who was admitted to the hospital after she got unresponsive at home and was found to be in acute on chronic hypoxic hypercarbic respiratory failure due to combination of COPD and CHF exacerbation.   Subjective:    Sonya Oliver today has, No headache, No chest pain, No abdominal pain - No Nausea, No new weakness tingling or numbness, No Cough - Improved SOB.     Assessment  & Plan :     1. Metabolic encephalopathy cause due to acute on chronic hypoxic respiratory failure due to combination of acute and chronic COPD exacerbation along with acute on chronic Diastolic CHF ef 78%.  Patient much improved after IV Lasix, IV Solu-Medrol and BiPAP, ABGs and mentation have improved, she appears to be relatively symptom-free at this time, we'll try to titrate her down to nasal cannula oxygen note she uses 3 L nasal Oxygen at Home. Continue with Coreg, Diuresis and Steroids, Add Scheduled and As Needed Nebulizer Treatments. Restrict Fluid Intake, Check Echocardiogram.   2. Hypertension. Currently on Coreg and diuretics monitor.  3. DM type II. On Glucophage at home currently on sliding scale and monitor.  CBG (last  3)   Recent Labs  12/03/16 0740  GLUCAP 137*       Diet : Diet clear liquid Room service appropriate? Yes; Fluid consistency: Thin; Fluid restriction: 1500 mL Fluid    Family Communication  :  None  Code Status :  DNR  Disposition Plan  :  TBD  Consults  :  None  Procedures  :  DVT Prophylaxis  :  Lovenox   Lab Results  Component Value Date   PLT 191 12/02/2016    Inpatient Medications  Scheduled Meds: . carvedilol  3.125 mg Oral BID WC  . chlorhexidine  15 mL Mouth Rinse BID  . cycloSPORINE  1 drop Both Eyes BID  . enoxaparin (LOVENOX) injection  40 mg Subcutaneous Q24H  . fluticasone furoate-vilanterol  1 puff Inhalation Daily  . furosemide  40 mg Intravenous Q12H  . guaiFENesin  600 mg Oral BID  . insulin aspart  0-9 Units Subcutaneous TID WC  . ipratropium-albuterol  3 mL Nebulization Q6H  . mouth rinse  15 mL Mouth Rinse q12n4p  . methylPREDNISolone (SOLU-MEDROL) injection  80 mg Intravenous TID   Continuous Infusions: PRN Meds:.  Antibiotics  :    Anti-infectives    Start     Dose/Rate Route Frequency Ordered Stop   12/02/16 2330  vancomycin (VANCOCIN) IVPB 1000 mg/200 mL premix     1,000 mg 200 mL/hr over 60 Minutes Intravenous  Once 12/02/16 2321 12/03/16 0102   12/02/16 2330  piperacillin-tazobactam (ZOSYN) IVPB 3.375 g     3.375 g 100 mL/hr over 30 Minutes Intravenous  Once 12/02/16 2321 12/03/16 0034         Objective:   Vitals:   12/03/16 0400 12/03/16 0500 12/03/16 0722 12/03/16 0749  BP: 125/68 (!) 108/58    Pulse: 87 78 82   Resp: 18 16    Temp:    98.2 F (36.8 C)  TempSrc:    Axillary  SpO2: 99% 97% 97%   Weight:      Height:        Wt Readings from Last 3 Encounters:  12/03/16 76.9 kg (169 lb 8.5 oz)  10/02/16 78.6 kg (173 lb 4.8 oz)  08/11/16 83.5 kg (184 lb)     Intake/Output Summary (Last 24 hours) at 12/03/16 0928 Last data filed at 12/03/16 0500  Gross per 24 hour  Intake                0 ml  Output              1550 ml  Net            -1550 ml     Physical Exam  Awake Alert, Oriented X 3, No new F.N deficits, Normal affect [place].AT,PERRAL Supple Neck,No JVD, No cervical lymphadenopathy appriciated.  Symmetrical Chest wall movement, Good air movement bilaterally, few wheezes and rales RRR,No Gallops,Rubs or new Murmurs, No Parasternal Heave +ve B.Sounds, Abd Soft, No tenderness, No organomegaly appriciated, No rebound - guarding or rigidity. No Cyanosis, Clubbing or edema, No new Rash or bruise     Data Review:    CBC  Recent Labs Lab 12/02/16 2255  WBC 5.2  HGB 14.0  HCT 44.0  PLT 191  MCV 88.5  MCH 28.2  MCHC 31.8  RDW 14.6  LYMPHSABS 1.8  MONOABS 0.5  EOSABS 0.5  BASOSABS 0.0    Chemistries   Recent Labs Lab 12/02/16 2255  NA 138  K 4.0  CL 96*  CO2 31  GLUCOSE 131*  BUN 12  CREATININE 0.65  CALCIUM 9.5  AST 20  ALT 10*  ALKPHOS 56  BILITOT 0.9   ------------------------------------------------------------------------------------------------------------------ No results for input(s): CHOL, HDL, LDLCALC, TRIG, CHOLHDL, LDLDIRECT in the last 72 hours.  Lab Results  Component Value Date   HGBA1C 5.7 (H) 09/28/2016   ------------------------------------------------------------------------------------------------------------------ No results for input(s): TSH,  T4TOTAL, T3FREE, THYROIDAB in the last 72 hours.  Invalid input(s): FREET3 ------------------------------------------------------------------------------------------------------------------ No results for input(s): VITAMINB12, FOLATE, FERRITIN, TIBC, IRON, RETICCTPCT in the last 72 hours.  Coagulation profile No results for input(s): INR, PROTIME in the last 168 hours.  No results for input(s): DDIMER in the last 72 hours.  Cardiac Enzymes  Recent Labs Lab 12/02/16 2255  TROPONINI <0.03    ------------------------------------------------------------------------------------------------------------------    Component Value Date/Time   BNP 92.0 12/02/2016 2255   BNP 112.8 (H) 03/22/2013 1707    Micro Results No results found for this or any previous visit (from the past 240 hour(s)).  Radiology Reports Dg Chest Portable 1 View  Result Date: 12/02/2016 CLINICAL DATA:  Dyspnea for several days at home. EXAM: PORTABLE CHEST 1 VIEW COMPARISON:  09/28/2016 FINDINGS: Hyperinflation and cardiomegaly. Interstitial fluid or edema. Patchy opacities in the central and basilar regions, right greater than left. This may represent asymmetric alveolar edema. Cannot exclude pneumonia. No large effusions. Upper lobe emphysematous changes. IMPRESSION: Central and basilar opacities, right greater than left, alveolar edema versus infectious infiltrate. There also is mild interstitial prominence and this could all represent CHF superimposed on COPD. Electronically Signed   By: Andreas Newport M.D.   On: 12/02/2016 23:15    Time Spent in minutes  30   Lala Lund M.D on 12/03/2016 at 9:28 AM  Between 7am to 7pm - Pager - [phone] ( page via [facility], text pages only, please mention full 10 digit call back number).  After 7pm go to [email or web address] - password [facility]  [facility] -  Office  [phone]

## 2016-12-03 NOTE — ED Notes (Signed)
Pt more alert, able to answer simple questions, update given,

## 2016-12-03 NOTE — ED Notes (Signed)
Pt repositioned, family at bedside updated,

## 2016-12-03 NOTE — Evaluation (Signed)
Physical Therapy Evaluation Patient Details Name: LIANETTE BROUSSARD MRN: [id number] DOB: 1941-03-27 Today's Date: 12/03/2016   History of Present Illness  76 year old African-American female with history of unspecified chronic CHF no echo in file, COPD on 3 L nasal cannula oxygen at home, hypertension, hypothyroidism,  past history of stroke who was admitted to the hospital after she got unresponsive at home and was found to be in acute on chronic hypoxic hypercarbic respiratory failure due to combination of COPD and CHF exacerbation.    Clinical Impression  Pt received in bed, and was agreeable to PT evaluation.  Pt normally ambulates with rollator walker, and is independent with dressing and bathing.  During PT evaluation, pt transferred sit<>stand Mod (I) with RW, and ambulated 184f with RW and Supervision/Mod (I) with cues to keep feet inside the base of the RW.  Pt expressed that her mobility is at baseline.  Therefore, no further skilled PT needs at this time, PT will sign off.      Follow Up Recommendations No PT follow up    Equipment Recommendations  None recommended by PT    Recommendations for Other Services       Precautions / Restrictions Restrictions Weight Bearing Restrictions: No      Mobility  Bed Mobility Overal bed mobility: Modified Independent                Transfers Overall transfer level: Modified independent Equipment used: Rolling walker (2 wheeled)                Ambulation/Gait Ambulation/Gait assistance: Supervision;Modified independent (Device/Increase time) Ambulation Distance (Feet): 100 Feet Assistive device: Rolling walker (2 wheeled) Gait Pattern/deviations: Step-through pattern;Trunk flexed     General Gait Details: vc's to keep feet inside the base of the RW.    Stairs            Wheelchair Mobility    Modified Rankin (Stroke Patients Only)       Balance Overall balance assessment: Needs  assistance Sitting-balance support: Bilateral upper extremity supported;Feet supported Sitting balance-Leahy Scale: Good     Standing balance support: Bilateral upper extremity supported Standing balance-Leahy Scale: Fair                               Pertinent Vitals/Pain Pain Assessment: No/denies pain    Home Living   Living Arrangements: Children (son and dtr in law) Available Help at Discharge: Available 24 hours/day Type of Home: House Home Access: Stairs to enter   [employer] Steps: 3 Home Layout: One level Home Equipment: Bedside commode;Cane - single point;Walker - 2 wheels;Walker - 4 wheels;Other (comment) (O2 3L prn)      Prior Function     Gait / Transfers Assistance Needed: Pt ambulates with Rollator.  Dtr drives, and pt is able to ambulate in community with Rollator.   ADL's / Homemaking Assistance Needed: independent.         Hand Dominance        Extremity/Trunk Assessment   Upper Extremity Assessment Upper Extremity Assessment: Overall WFL for tasks assessed    Lower Extremity Assessment Lower Extremity Assessment: Overall WFL for tasks assessed       Communication   Communication: No difficulties  Cognition Arousal/Alertness: (P) Awake/alert Behavior During Therapy: (P) WFL for tasks assessed/performed Overall Cognitive Status: (P) Within Functional Limits for tasks assessed  General Comments      Exercises     Assessment/Plan    PT Assessment Patent does not need any further PT services  PT Problem List         PT Treatment Interventions      PT Goals (Current goals can be found in the Care Plan section)  Acute Rehab PT Goals Patient Stated Goal: To go home.  PT Goal Formulation: All assessment and education complete, DC therapy    Frequency     Barriers to discharge        Co-evaluation               End of Session Equipment  Utilized During Treatment: Gait belt;Oxygen Activity Tolerance: Patient tolerated treatment well Patient left: in chair;with call bell/phone within reach Nurse Communication: Mobility status (mobility sheet left in the room. ) PT Visit Diagnosis: Other abnormalities of gait and mobility (R26.89)    Time: 1444-1510 PT Time Calculation (min) (ACUTE ONLY): 26 min   Charges:   PT Evaluation $PT Eval Low Complexity: 1 Procedure PT Treatments $Gait Training: 8-22 mins   PT G Codes:   PT G-Codes **NOT FOR INPATIENT CLASS** Functional Assessment Tool Used: AM-PAC 6 Clicks Basic Mobility;Clinical judgement Functional Limitation: Mobility: Walking and moving around Mobility: Walking and Moving Around Current Status (L9379): At least 20 percent but less than 40 percent impaired, limited or restricted Mobility: Walking and Moving Around Goal Status [phone]): At least 20 percent but less than 40 percent impaired, limited or restricted Mobility: Walking and Moving Around Discharge Status [phone]): At least 20 percent but less than 40 percent impaired, limited or restricted    Beth Elisabella Hacker, PT, DPT X: [phone]

## 2016-12-03 NOTE — ED Notes (Signed)
Dr Lama at bedside,  

## 2016-12-03 NOTE — H&P (Addendum)
TRH H&P    Patient Demographics:    Sonya Oliver, is a 76 y.o. female  MRN: [id number]  DOB - 07-24-1941  Admit Date - 12/02/2016  Referring MD/NP/PA: Dr. Oleta Mouse  Outpatient Primary MD for the patient is Wardell Honour, MD  Patient coming from: home   Chief Complaint  Patient presents with  . Shortness of Breath      HPI:    Sonya Oliver  is a 76 y.o. female, With history of COPD, diabetes mellitus, CHF was brought to the ED for shortness of breath for past few days. Patient became unresponsive at home and wasn't respiratory distress. So family brought patient to ED. Patient has chronic respiratory failure and is on 3 L of oxygen at home.  In the ED ABG was concerning for hypercapnia with PCO2 of 71. Patient was put on BiPAP, also chest x-ray raise possibility of pneumonia so she was started on vancomycin and Zosyn for possible HCAP.  Patient is unable to provide any history. As per her son she has been short of breath. There is no history of nausea vomiting or diarrhea. No fever or dysuria.   Review of systems:    Unobtainable as patient is unresponsive    With Past History of the following :    Past Medical History:  Diagnosis Date  . Arthritis   . Bladder tumor   . Cancer ([place])    bladder  . Cataract   . CHF (congestive heart failure) ([place])    NO CARDIOLOGIST---  MONITORED BY PCP DR Jacelyn Grip  . COPD with emphysema ([place])    last Acute Exacerbation 08-21-2014  . Dyspnea on exertion   . H/O pulmonary edema    jan 2014--  acute CHF w/ pulmonary edema  . Hypertension   . Hyperthyroidism    currently no meds -- labs stable  . Nocturia   . Nocturnal oxygen desaturation    uses O2  HS via [place]--  and PRN daytime  . Osteoporosis   . Stroke ([place])   . Type 2 diabetes mellitus ([place])   . Ureteral tumor       Past Surgical History:  Procedure Laterality Date  . ABDOMINAL HYSTERECTOMY  1980'S    W/ BILATERAL SALPINGO-OOPHORECTOMY  . CATARACT EXTRACTION W/PHACO Left 01/31/2014   Procedure: CATARACT EXTRACTION PHACO AND INTRAOCULAR LENS PLACEMENT (IOC);  Surgeon: Tonny Branch, MD;  Location: [facility];  Service: Ophthalmology;  Laterality: Left;  CDE: 23.78  . CATARACT EXTRACTION W/PHACO Right 02/28/2014   Procedure: CATARACT EXTRACTION PHACO AND INTRAOCULAR LENS PLACEMENT (IOC);  Surgeon: Tonny Branch, MD;  Location: [facility];  Service: Ophthalmology;  Laterality: Right;  CDE:  11.34  . CYSTOSCOPY  03/23/2012   Procedure: CYSTOSCOPY;  Surgeon: Claybon Jabs, MD;  Location: [facility];  Service: Urology;  Laterality: N/A;  . CYSTOSCOPY WITH RETROGRADE PYELOGRAM, URETEROSCOPY AND STENT PLACEMENT Right 10/21/2014   Procedure: CYSTOSCOPY WITH RETROGRADE PYELOGRAM, URETEROSCOPY, BLADDER TUMOR BIOPSY;  Surgeon: Claybon Jabs, MD;  Location: [facility];  Service: Urology;  Laterality: Right;  . TRANSTHORACIC ECHOCARDIOGRAM  06-21-2013   moderate LVH,  grade I diastolic dysfunction, ef 01-02%,  mild MR & TR  . TRANSURETHRAL RESECTION OF BLADDER TUMOR  03/23/2012   Procedure: TRANSURETHRAL RESECTION OF BLADDER TUMOR (TURBT);  Surgeon: Claybon Jabs, MD;  Location: [facility];  Service: Urology;  Laterality: N/A;  . TRANSURETHRAL RESECTION OF BLADDER TUMOR N/A 01/08/2013   Procedure: TRANSURETHRAL RESECTION OF BLADDER TUMOR (TURBT);  Surgeon: Claybon Jabs, MD;  Location: [facility];  Service: Urology;  Laterality: N/A;      Social History:      Social History  Substance Use Topics  . Smoking status: Former Smoker    Packs/day: 1.00    Years: 50.00    Types: Cigarettes    Quit date: 09/24/2006  . Smokeless tobacco: Never Used  . Alcohol use No       Family History :     Family History  Problem Relation Age of Onset  . Diabetes Father   . Congestive Heart Failure Father   . COPD Father   . Kidney disease Father   . Hypertension Father   . Diabetes Mother      with kidney disease  . Kidney disease Mother   . Heart attack Brother   . Asthma Brother   . Diabetes Daughter   . Diabetes Son   . Cancer Brother     throat  . Hypertension Brother       Home Medications:   Prior to Admission medications   Medication Sig Start Date End Date Taking? Authorizing Provider  albuterol (PROVENTIL HFA;VENTOLIN HFA) 108 (90 Base) MCG/ACT inhaler Inhale 1-2 puffs into the lungs every 6 (six) hours as needed for wheezing or shortness of breath. 04/07/16   Wardell Honour, MD  alendronate (FOSAMAX) 70 MG tablet Take 1 tablet (70 mg total) by mouth once a week. Take with a full glass of water on an empty stomach. 07/08/16   Cherre Robins, PharmD  carvedilol (COREG) 3.125 MG tablet TAKE 1 TABLET BY MOUTH TWICE DAILY 05/10/16   Wardell Honour, MD  Cholecalciferol (VITAMIN D3) 2000 UNITS TABS Take 1 tablet by mouth daily.    Historical Provider, MD  fluticasone furoate-vilanterol (BREO ELLIPTA) 100-25 MCG/INH AEPB Inhale 1 puff into the lungs daily. 02/27/16   Wardell Honour, MD  furosemide (LASIX) 40 MG tablet TAKE 1 TABLET BY MOUTH ONCE DAILY 06/28/16   Wardell Honour, MD  guaiFENesin (MUCINEX) 600 MG 12 hr tablet Take 1 tablet (600 mg total) by mouth 2 (two) times daily. 12/18/15   Kathie Dike, MD  metFORMIN (GLUCOPHAGE) 500 MG tablet Take 1 tablet (500 mg total) by mouth daily. 06/28/16   Wardell Honour, MD  predniSONE (DELTASONE) 10 MG tablet Take 6 tablets (60 mg total) by mouth daily with breakfast. And decrease by one tablet daily 10/03/16   Orson Eva, MD  Prenatal Vit-Fe Fumarate-FA (M-VIT) tablet Take 1 tablet by mouth daily.    Historical Provider, MD  RESTASIS MULTIDOSE 0.05 % ophthalmic emulsion Place 1 drop into both eyes 2 (two) times daily. 01/19/16   Historical Provider, MD     Allergies:    No Known Allergies   Physical Exam:   Vitals  Blood pressure 116/64, pulse 75, temperature 98.2 F (36.8 C), temperature source Oral, resp. rate 16,  SpO2 97 %.  1.  General: Patient is currently on BiPAP, response to painful stimuli   2. Psychiat  Not tested.   3.Neurologic: Not tested   4. Eyes :  anicteric sclerae,   5. ENMT:  Oropharynx clear with moist mucous membranes   6. Neck:  supple, no cervical lymphadenopathy appriciated, No thyromegaly  7. Respiratory : Decreased breath sound bilaterally, on BiPAP   8. Cardiovascular : RRR, no gallops, rubs or murmurs, no leg edema  9. Gastrointestinal:  Positive bowel sounds, abdomen soft, non-tender to palpation,no hepatosplenomegaly, no rigidity or guarding       10. Skin:  No cyanosis, normal texture and turgor, no rash, lesions or ulcers  11.Musculoskeletal:  Good muscle tone,  joints appear normal , no effusions,  normal range of motion    Data Review:    CBC  Recent Labs Lab 12/02/16 2255  WBC 5.2  HGB 14.0  HCT 44.0  PLT 191  MCV 88.5  MCH 28.2  MCHC 31.8  RDW 14.6  LYMPHSABS 1.8  MONOABS 0.5  EOSABS 0.5  BASOSABS 0.0   ------------------------------------------------------------------------------------------------------------------  Chemistries   Recent Labs Lab 12/02/16 2255  NA 138  K 4.0  CL 96*  CO2 31  GLUCOSE 131*  BUN 12  CREATININE 0.65  CALCIUM 9.5  AST 20  ALT 10*  ALKPHOS 56  BILITOT 0.9   ------------------------------------------------------------------------------------------------------------------  ------------------------------------------------------------------------------------------------------------------ GFR: CrCl cannot be calculated (Unknown ideal weight.). Liver Function Tests:  Recent Labs Lab 12/02/16 2255  AST 20  ALT 10*  ALKPHOS 56  BILITOT 0.9  PROT 9.1*  ALBUMIN 4.4   Cardiac Enzymes:  Recent Labs Lab 12/02/16 2255  TROPONINI <0.03    --------------------------------------------------------------------------------------------------------------- Urine analysis:    Component  Value Date/Time   COLORURINE STRAW (A) 12/02/2016 2322   APPEARANCEUR CLEAR 12/02/2016 2322   LABSPEC 1.005 12/02/2016 2322   PHURINE 6.0 12/02/2016 2322   GLUCOSEU NEGATIVE 12/02/2016 2322   HGBUR NEGATIVE 12/02/2016 2322   BILIRUBINUR NEGATIVE 12/02/2016 2322   KETONESUR NEGATIVE 12/02/2016 2322   PROTEINUR NEGATIVE 12/02/2016 2322   UROBILINOGEN 1.0 03/21/2012 1936   NITRITE NEGATIVE 12/02/2016 2322   LEUKOCYTESUR NEGATIVE 12/02/2016 2322      Imaging Results:    Dg Chest Portable 1 View  Result Date: 12/02/2016 CLINICAL DATA:  Dyspnea for several days at home. EXAM: PORTABLE CHEST 1 VIEW COMPARISON:  09/28/2016 FINDINGS: Hyperinflation and cardiomegaly. Interstitial fluid or edema. Patchy opacities in the central and basilar regions, right greater than left. This may represent asymmetric alveolar edema. Cannot exclude pneumonia. No large effusions. Upper lobe emphysematous changes. IMPRESSION: Central and basilar opacities, right greater than left, alveolar edema versus infectious infiltrate. There also is mild interstitial prominence and this could all represent CHF superimposed on COPD. Electronically Signed   By: Andreas Newport M.D.   On: 12/02/2016 23:15    My personal review of EKG: Rhythm - Sinus tachycardia   Assessment & Plan:    Active Problems:   Respiratory failure with hypoxia and hypercapnia (HCC)   1. Acute on chronic hypoxic and hypercarbic respiratory failure- multifactorial from underlying pulmonary edema, ? Healthcare associated pneumonia. Continue BiPAP. ABG showed pH 7.316, PCO2 61.6, PO2 73.3. Continue vancomycin and Zosyn per pharmacy consultation. Patient received Lasix 80 mg IV in the ED, with good urine output. 2.  COPD- patient also has underlying COPD, will start Solu-Medrol 60 mg IV every 6 hours, DuoNeb nebulizers every 6 hours. 3. Pulmonary edema- chest official pulmonary edema, patient received Lasix in the ED, will continue with Lasix 40 mg IV  every 12 hours. 4. Diabetes mellitus-hold  metformin, we'll start sliding scale insulin with NovoLog. 5. Hypertension - blood pressure improved after patient was put on BiPAP, continue Coreg.   DVT Prophylaxis-   Lovenox   AM Labs Ordered, also please review Full Orders  Family Communication: Admission, patients condition and plan of care including tests being ordered have been discussed with the patient's s at bedside   who indicate understanding and agree with the plan and Code Status.  Code Status:  DNR  Admission status:  observation   Time spent in minutes : 60 minutes    Miu Chiong S M.D on 12/03/2016 at 3:20 AM  Between 7am to 7pm - Pager - [phone]. After 7pm go to [email or web address] - password [facility]  [facility] - Office  [phone]

## 2016-12-03 NOTE — ED Notes (Signed)
Family at bedside, updated on plan of care and admission to hospital,

## 2016-12-03 NOTE — Care Management Note (Addendum)
Case Management Note  Patient Details  Name: Sonya Oliver MRN: [id number] Date of Birth: 09-25-1940  Subjective/Objective: Adm with respiratory failure with hypoxia and hypercapnia. She lives with son and DIL. Patient sleeping, DIL offering information. Patient has cane, RW, BSC and oxygen PTA. She has PCP (Dr. Sabra Heck), DIL drives her to appointments. She has prescription coverage and reports no issues affording medications. No home health currently. DIL states patient would be agreeable to [facility] if needed at time of discharge.  DIL states that they are in the process of changing oxygen providers from [facility] to [facility] so patient can have small portable tanks.               Action/Plan:  Anticipate DC home. CM will follow for needs. ? [facility]   Expected Discharge Date:     12/06/2016           Expected Discharge Plan:  [place]  In-House Referral:     Discharge planning Services  CM Consult  Post Acute Care Choice:    Choice offered to:     DME Arranged:    DME Agency:     HH Arranged:    HH Agency:     Status of Service:  In process, will continue to follow  If discussed at Long Length of Stay Meetings, dates discussed:    Additional Comments:  [name], [name], RN 12/03/2016, 2:22 PM

## 2016-12-03 NOTE — Progress Notes (Signed)
*  PRELIMINARY RESULTS* Echocardiogram 2D Echocardiogram has been performed.  Sonya Oliver 12/03/2016, 11:34 AM

## 2016-12-03 NOTE — ED Notes (Signed)
Pt and family updated at bedside,

## 2016-12-03 NOTE — ED Notes (Signed)
ED Provider at bedside. 

## 2016-12-03 NOTE — Care Management Obs Status (Signed)
[facility] NOTIFICATION   Patient Details  Name: Sonya Oliver MRN: [id number] Date of Birth: 1941-08-18   Medicare Observation Status Notification Given:  Yes    Airiel Oblinger, [name], RN 12/03/2016, 2:31 PM

## 2016-12-04 DIAGNOSIS — Z9981 Dependence on supplemental oxygen: Secondary | ICD-10-CM | POA: Diagnosis not present

## 2016-12-04 DIAGNOSIS — J441 Chronic obstructive pulmonary disease with (acute) exacerbation: Secondary | ICD-10-CM | POA: Diagnosis present

## 2016-12-04 DIAGNOSIS — G9341 Metabolic encephalopathy: Secondary | ICD-10-CM | POA: Diagnosis present

## 2016-12-04 DIAGNOSIS — J9622 Acute and chronic respiratory failure with hypercapnia: Secondary | ICD-10-CM | POA: Diagnosis present

## 2016-12-04 DIAGNOSIS — J9601 Acute respiratory failure with hypoxia: Secondary | ICD-10-CM | POA: Diagnosis not present

## 2016-12-04 DIAGNOSIS — J440 Chronic obstructive pulmonary disease with acute lower respiratory infection: Secondary | ICD-10-CM | POA: Diagnosis present

## 2016-12-04 DIAGNOSIS — Z833 Family history of diabetes mellitus: Secondary | ICD-10-CM | POA: Diagnosis not present

## 2016-12-04 DIAGNOSIS — Z825 Family history of asthma and other chronic lower respiratory diseases: Secondary | ICD-10-CM | POA: Diagnosis not present

## 2016-12-04 DIAGNOSIS — J9602 Acute respiratory failure with hypercapnia: Secondary | ICD-10-CM | POA: Diagnosis not present

## 2016-12-04 DIAGNOSIS — J189 Pneumonia, unspecified organism: Secondary | ICD-10-CM | POA: Diagnosis present

## 2016-12-04 DIAGNOSIS — I5033 Acute on chronic diastolic (congestive) heart failure: Secondary | ICD-10-CM | POA: Diagnosis present

## 2016-12-04 DIAGNOSIS — Z841 Family history of disorders of kidney and ureter: Secondary | ICD-10-CM | POA: Diagnosis not present

## 2016-12-04 DIAGNOSIS — Z8673 Personal history of transient ischemic attack (TIA), and cerebral infarction without residual deficits: Secondary | ICD-10-CM | POA: Diagnosis not present

## 2016-12-04 DIAGNOSIS — Z8249 Family history of ischemic heart disease and other diseases of the circulatory system: Secondary | ICD-10-CM | POA: Diagnosis not present

## 2016-12-04 DIAGNOSIS — J810 Acute pulmonary edema: Secondary | ICD-10-CM | POA: Diagnosis present

## 2016-12-04 DIAGNOSIS — Z66 Do not resuscitate: Secondary | ICD-10-CM | POA: Diagnosis present

## 2016-12-04 DIAGNOSIS — Z7984 Long term (current) use of oral hypoglycemic drugs: Secondary | ICD-10-CM | POA: Diagnosis not present

## 2016-12-04 DIAGNOSIS — I110 Hypertensive heart disease with heart failure: Secondary | ICD-10-CM | POA: Diagnosis present

## 2016-12-04 DIAGNOSIS — Z79899 Other long term (current) drug therapy: Secondary | ICD-10-CM | POA: Diagnosis not present

## 2016-12-04 DIAGNOSIS — E119 Type 2 diabetes mellitus without complications: Secondary | ICD-10-CM | POA: Diagnosis present

## 2016-12-04 DIAGNOSIS — Z87891 Personal history of nicotine dependence: Secondary | ICD-10-CM | POA: Diagnosis not present

## 2016-12-04 DIAGNOSIS — M810 Age-related osteoporosis without current pathological fracture: Secondary | ICD-10-CM | POA: Diagnosis present

## 2016-12-04 DIAGNOSIS — Z90710 Acquired absence of both cervix and uterus: Secondary | ICD-10-CM | POA: Diagnosis not present

## 2016-12-04 DIAGNOSIS — J9621 Acute and chronic respiratory failure with hypoxia: Secondary | ICD-10-CM | POA: Diagnosis present

## 2016-12-04 DIAGNOSIS — Y95 Nosocomial condition: Secondary | ICD-10-CM | POA: Diagnosis present

## 2016-12-04 LAB — GLUCOSE, CAPILLARY
GLUCOSE-CAPILLARY: 165 mg/dL — AB (ref 65–99)
GLUCOSE-CAPILLARY: 138 mg/dL — AB (ref 65–99)
GLUCOSE-CAPILLARY: 163 mg/dL — AB (ref 65–99)
Glucose-Capillary: 144 mg/dL — ABNORMAL HIGH (ref 65–99)

## 2016-12-04 LAB — BASIC METABOLIC PANEL
Anion gap: 9 (ref 5–15)
BUN: 24 mg/dL — AB (ref 6–20)
CHLORIDE: 94 mmol/L — AB (ref 101–111)
CO2: 34 mmol/L — ABNORMAL HIGH (ref 22–32)
CREATININE: 0.78 mg/dL (ref 0.44–1.00)
Calcium: 8.5 mg/dL — ABNORMAL LOW (ref 8.9–10.3)
GFR calc Af Amer: >60 mL/min (ref >60)
GFR calc non Af Amer: >60 mL/min (ref >60)
GLUCOSE: 172 mg/dL — AB (ref 65–99)
POTASSIUM: 3.7 mmol/L (ref 3.5–5.1)
Sodium: 137 mmol/L (ref 135–145)

## 2016-12-04 LAB — HEMOGLOBIN A1C
HEMOGLOBIN A1C: 6.1 % — AB (ref 4.8–5.6)
MEAN PLASMA GLUCOSE: 128 mg/dL

## 2016-12-04 MED ORDER — METHYLPREDNISOLONE SODIUM SUCC 125 MG IJ SOLR
80.0000 mg | Freq: Two times a day (BID) | INTRAMUSCULAR | Status: DC
  Administered 2016-12-04 – 2016-12-05 (×3): 80 mg via INTRAVENOUS
  Filled 2016-12-04 (×2): qty 2

## 2016-12-04 MED ORDER — IPRATROPIUM-ALBUTEROL 0.5-2.5 (3) MG/3ML IN SOLN
3.0000 mL | Freq: Three times a day (TID) | RESPIRATORY_TRACT | Status: DC
  Administered 2016-12-05: 3 mL via RESPIRATORY_TRACT
  Filled 2016-12-04: qty 3

## 2016-12-04 MED ORDER — INSULIN ASPART 100 UNIT/ML ~~LOC~~ SOLN: 0.0000 [IU] | Freq: Every day | SUBCUTANEOUS | Status: DC

## 2016-12-04 MED ORDER — INSULIN ASPART 100 UNIT/ML ~~LOC~~ SOLN
0.0000 [IU] | Freq: Three times a day (TID) | SUBCUTANEOUS | Status: DC
  Administered 2016-12-04: 3 [IU] via SUBCUTANEOUS
  Administered 2016-12-04: 2 [IU] via SUBCUTANEOUS
  Administered 2016-12-05: 3 [IU] via SUBCUTANEOUS

## 2016-12-04 MED ORDER — POTASSIUM CHLORIDE CRYS ER 20 MEQ PO TBCR
40.0000 meq | EXTENDED_RELEASE_TABLET | Freq: Once | ORAL | Status: AC
  Administered 2016-12-04: 40 meq via ORAL
  Filled 2016-12-04: qty 2

## 2016-12-04 NOTE — Progress Notes (Signed)
PROGRESS NOTE                                                                                                                                                                                                             Patient Demographics:    Sonya Oliver, is a 76 y.o. female, DOB - [birth year], EXN:[id number]  Admit date - 12/02/2016   Admitting Physician Oswald Hillock, MD  Outpatient Primary MD for the patient is MILLER, Lillette Boxer, MD  LOS - 0  Chief Complaint  Patient presents with  . Shortness of Breath       Brief Narrative   76 year old African-American female with history of unspecified chronic CHF no echo in file, COPD on 3 L nasal cannula oxygen at home, hypertension, hypothyroidism,  past history of stroke who was admitted to the hospital after she got unresponsive at home and was found to be in acute on chronic hypoxic hypercarbic respiratory failure due to combination of COPD and CHF exacerbation.   Subjective:    Sonya Oliver today has, No headache, No chest pain, No abdominal pain - No Nausea, No new weakness tingling or numbness, No Cough - Improved SOB.     Assessment  & Plan :     1. Metabolic encephalopathy cause due to acute on chronic hypoxic respiratory failure due to combination of acute and chronic COPD exacerbation along with acute on chronic Diastolic CHF ef 49%.  Patient much improved after IV Lasix (-2.6lits) , IV Solu-Medrol and BiPAP, ABGs and mentation have improved, she appears to be relatively symptom-free at this time, now down to nasal cannula oxygen note she uses 3 L nasal Oxygen at home. Continue with Coreg, Diuresis and Steroids, added scheduled and As Needed Nebulizer Treatments, restrict Fluid Intake, Stable recent Echocardiogram. Transfer to tele, advance activity.   2. Hypertension. Currently on Coreg and diuretics monitor.  3. DM type II. On Glucophage at home currently on  sliding scale and monitor.  CBG (last 3)   Recent Labs  12/03/16 1723 12/03/16 2216 12/04/16 0736  GLUCAP 147* 157* 144*       Diet : Diet heart healthy/carb modified Room service appropriate? Yes; Fluid consistency: Thin; Fluid restriction: 1500 mL Fluid    Family Communication  :  None  Code Status :  DNR  Disposition Plan  :  Home 1-2 days  Consults  :  None  Procedures  :    DVT Prophylaxis  :  Lovenox   Lab Results  Component Value Date   PLT 191 12/02/2016    Inpatient Medications  Scheduled Meds: . carvedilol  3.125 mg Oral BID WC  . chlorhexidine  15 mL Mouth Rinse BID  . cycloSPORINE  1 drop Both Eyes BID  . enoxaparin (LOVENOX) injection  40 mg Subcutaneous Q24H  . fluticasone furoate-vilanterol  1 puff Inhalation Daily  . furosemide  40 mg Intravenous Q12H  . guaiFENesin  600 mg Oral BID  . insulin aspart  0-15 Units Subcutaneous TID WC  . insulin aspart  0-5 Units Subcutaneous QHS  . ipratropium-albuterol  3 mL Nebulization Q6H  . mouth rinse  15 mL Mouth Rinse q12n4p  . methylPREDNISolone (SOLU-MEDROL) injection  80 mg Intravenous TID  . potassium chloride  40 mEq Oral Once   Continuous Infusions: PRN Meds:.  Antibiotics  :    Anti-infectives    Start     Dose/Rate Route Frequency Ordered Stop   12/02/16 2330  vancomycin (VANCOCIN) IVPB 1000 mg/200 mL premix     1,000 mg 200 mL/hr over 60 Minutes Intravenous  Once 12/02/16 2321 12/03/16 0102   12/02/16 2330  piperacillin-tazobactam (ZOSYN) IVPB 3.375 g     3.375 g 100 mL/hr over 30 Minutes Intravenous  Once 12/02/16 2321 12/03/16 0034         Objective:   Vitals:   12/04/16 0500 12/04/16 0600 12/04/16 0731 12/04/16 0734  BP: (!) 114/50 (!) 107/48    Pulse: 65 63    Resp: 16 18    Temp:    98 F (36.7 C)  TempSrc:    Oral  SpO2: 99% 97% 99%   Weight:      Height:        Wt Readings from Last 3 Encounters:  12/04/16 76.1 kg (167 lb 12.3 oz)  10/02/16 78.6 kg (173 lb 4.8  oz)  08/11/16 83.5 kg (184 lb)     Intake/Output Summary (Last 24 hours) at 12/04/16 0815 Last data filed at 12/04/16 0500  Gross per 24 hour  Intake              960 ml  Output             1750 ml  Net             -790 ml     Physical Exam  Awake Alert, Oriented X 3, No new F.N deficits, Normal affect [place].AT,PERRAL Supple Neck,No JVD, No cervical lymphadenopathy appriciated.  Symmetrical Chest wall movement, Good air movement bilaterally, few rales RRR,No Gallops,Rubs or new Murmurs, No Parasternal Heave +ve B.Sounds, Abd Soft, No tenderness, No organomegaly appriciated, No rebound - guarding or rigidity. No Cyanosis, Clubbing or edema, No new Rash or bruise     Data Review:    CBC  Recent Labs Lab 12/02/16 2255  WBC 5.2  HGB 14.0  HCT 44.0  PLT 191  MCV 88.5  MCH 28.2  MCHC 31.8  RDW 14.6  LYMPHSABS 1.8  MONOABS 0.5  EOSABS 0.5  BASOSABS 0.0    Chemistries   Recent Labs Lab 12/02/16 2255 12/04/16 0439  NA 138 137  K 4.0 3.7  CL 96* 94*  CO2 31 34*  GLUCOSE 131* 172*  BUN 12 24*  CREATININE 0.65 0.78  CALCIUM 9.5 8.5*  AST 20  --   ALT 10*  --  ALKPHOS 56  --   BILITOT 0.9  --    ------------------------------------------------------------------------------------------------------------------ No results for input(s): CHOL, HDL, LDLCALC, TRIG, CHOLHDL, LDLDIRECT in the last 72 hours.  Lab Results  Component Value Date   HGBA1C 6.1 (H) 12/02/2016   ------------------------------------------------------------------------------------------------------------------ No results for input(s): TSH, T4TOTAL, T3FREE, THYROIDAB in the last 72 hours.  Invalid input(s): FREET3 ------------------------------------------------------------------------------------------------------------------ No results for input(s): VITAMINB12, FOLATE, FERRITIN, TIBC, IRON, RETICCTPCT in the last 72 hours.  Coagulation profile No results for input(s): INR, PROTIME in  the last 168 hours.  No results for input(s): DDIMER in the last 72 hours.  Cardiac Enzymes  Recent Labs Lab 12/02/16 2255  TROPONINI <0.03   ------------------------------------------------------------------------------------------------------------------    Component Value Date/Time   BNP 92.0 12/02/2016 2255   BNP 112.8 (H) 03/22/2013 1707    Micro Results Recent Results (from the past 240 hour(s))  MRSA PCR Screening     Status: None   Collection Time: 12/03/16  3:42 AM  Result Value Ref Range Status   MRSA by PCR NEGATIVE NEGATIVE Final    Comment:        The GeneXpert MRSA Assay (FDA approved for NASAL specimens only), is one component of a comprehensive MRSA colonization surveillance program. It is not intended to diagnose MRSA infection nor to guide or monitor treatment for MRSA infections.     Radiology Reports Dg Chest 2 View  Result Date: 12/03/2016 CLINICAL DATA:  Shortness of breath. EXAM: CHEST  2 VIEW COMPARISON:  12/02/2016. 09/28/2016. CT 07/19/2016, 12/14/2015, 03/22/2012. FINDINGS: Mediastinum and hilar structures normal. Stable cardiomegaly. Interim improvement of pulmonary venous congestion suggesting improving CHF. Mild persistent interstitial prominence noted bilaterally no pleural effusion or pneumothorax. COPD cannot be excluded. Persistent thyroid enlargement with calcification again noted. No acute bony abnormality. IMPRESSION: 1. Stable cardiomegaly. Interim improvement of pulmonary venous congestion. Mild residual interstitial prominence. 2. COPD cannot be excluded. Electronically Signed   By: Marcello Moores  Register   On: 12/03/2016 14:44   Dg Chest Portable 1 View  Result Date: 12/02/2016 CLINICAL DATA:  Dyspnea for several days at home. EXAM: PORTABLE CHEST 1 VIEW COMPARISON:  09/28/2016 FINDINGS: Hyperinflation and cardiomegaly. Interstitial fluid or edema. Patchy opacities in the central and basilar regions, right greater than left. This may  represent asymmetric alveolar edema. Cannot exclude pneumonia. No large effusions. Upper lobe emphysematous changes. IMPRESSION: Central and basilar opacities, right greater than left, alveolar edema versus infectious infiltrate. There also is mild interstitial prominence and this could all represent CHF superimposed on COPD. Electronically Signed   By: Andreas Newport M.D.   On: 12/02/2016 23:15    Time Spent in minutes  30   Lala Lund M.D on 12/04/2016 at 8:15 AM  Between 7am to 7pm - Pager - [phone] ( page via [facility], text pages only, please mention full 10 digit call back number).  After 7pm go to [email or web address] - password [facility]  [facility] -  Office  [phone]

## 2016-12-05 LAB — BASIC METABOLIC PANEL
Anion gap: 10 (ref 5–15)
BUN: 29 mg/dL — ABNORMAL HIGH (ref 6–20)
CO2: 35 mmol/L — AB (ref 22–32)
CREATININE: 0.78 mg/dL (ref 0.44–1.00)
Calcium: 8.5 mg/dL — ABNORMAL LOW (ref 8.9–10.3)
Chloride: 92 mmol/L — ABNORMAL LOW (ref 101–111)
GLUCOSE: 171 mg/dL — AB (ref 65–99)
Potassium: 4.2 mmol/L (ref 3.5–5.1)
Sodium: 137 mmol/L (ref 135–145)

## 2016-12-05 LAB — GLUCOSE, CAPILLARY
Glucose-Capillary: 153 mg/dL — ABNORMAL HIGH (ref 65–99)
Glucose-Capillary: 94 mg/dL (ref 65–99)

## 2016-12-05 MED ORDER — SPIRONOLACTONE 25 MG PO TABS: 25.0000 mg | ORAL_TABLET | Freq: Every day | ORAL | 0 refills | Status: DC

## 2016-12-05 MED ORDER — PREDNISONE 5 MG PO TABS: ORAL_TABLET | ORAL | 0 refills | Status: DC

## 2016-12-05 MED ORDER — FUROSEMIDE 40 MG PO TABS: 40.0000 mg | ORAL_TABLET | Freq: Two times a day (BID) | ORAL | 0 refills | Status: DC

## 2016-12-05 NOTE — Discharge Instructions (Signed)
Follow with Primary MD MILLER, Lillette Boxer, MD in 3 days, use 4 L nasal cannula oxygen to you follow with Dr. Luan Pulling  Get CBC, CMP, 2 view Chest X ray checked  by Primary MD in 3 days ( we routinely change or add medications that can affect your baseline labs and fluid status, therefore we recommend that you get the mentioned basic workup next visit with your PCP, your PCP may decide not to get them or add new tests based on their clinical decision)  Activity: As tolerated with Full fall precautions use walker/cane & assistance as needed  Disposition Home    Diet:   Diet heart healthy/carb modified Room, Fluid restriction: 1500 mL Fluid  For Heart failure patients - Check your Weight same time everyday, if you gain over 2 pounds, or you develop in leg swelling, experience more shortness of breath or chest pain, call your Primary MD immediately. Follow Cardiac Low Salt Diet and 1.5 lit/day fluid restriction.  On your next visit with your primary care physician please Get Medicines reviewed and adjusted.  Please request your Prim.MD to go over all Hospital Tests and Procedure/Radiological results at the follow up, please get all Hospital records sent to your Prim MD by signing hospital release before you go home.  If you experience worsening of your admission symptoms, develop shortness of breath, life threatening emergency, suicidal or homicidal thoughts you must seek medical attention immediately by calling 911 or calling your MD immediately  if symptoms less severe.  You Must read complete instructions/literature along with all the possible adverse reactions/side effects for all the Medicines you take and that have been prescribed to you. Take any new Medicines after you have completely understood and accpet all the possible adverse reactions/side effects.   Do not drive, operate heavy machinery, perform activities at heights, swimming or participation in water activities or provide baby sitting  services if your were admitted for syncope or siezures until you have seen by Primary MD or a Neurologist and advised to do so again.  Do not drive when taking Pain medications.    Do not take more than prescribed Pain, Sleep and Anxiety Medications  Special Instructions: If you have smoked or chewed Tobacco  in the last 2 yrs please stop smoking, stop any regular Alcohol  and or any Recreational drug use.  Wear Seat belts while driving.   Please note  You were cared for by a hospitalist during your hospital stay. If you have any questions about your discharge medications or the care you received while you were in the hospital after you are discharged, you can call the unit and asked to speak with the hospitalist on call if the hospitalist that took care of you is not available. Once you are discharged, your primary care physician will handle any further medical issues. Please note that NO REFILLS for any discharge medications will be authorized once you are discharged, as it is imperative that you return to your primary care physician (or establish a relationship with a primary care physician if you do not have one) for your aftercare needs so that they can reassess your need for medications and monitor your lab values.

## 2016-12-05 NOTE — Discharge Summary (Signed)
Sonya Oliver XKG:[id number] DOB: November 06, 1940 DOA: 12/02/2016  PCP: Wardell Honour, MD  Admit date: 12/02/2016  Discharge date: 12/05/2016  Admitted From:  Home   Disposition:  Home   Recommendations for Outpatient Follow-up:   Follow up with PCP in 1-2 weeks  PCP Please obtain BMP/CBC, 2 view CXR in 1week,  (see Discharge instructions)   PCP Please follow up on the following pending results: Monitor BMP in diuretic dose closely   Home Health: None   Equipment/Devices: None  Consultations: None Discharge Condition: Fair   CODE STATUS: Full   Diet Recommendation: Diet heart healthy/carb modified Fluid restriction: 1500 mL/ day     Chief Complaint  Patient presents with  . Shortness of Breath     Brief history of present illness from the day of admission and additional interim summary     76 year old African-American female with history of unspecified chronic CHF no echo in file, COPD on 3 L nasal cannula oxygen at home, hypertension, hypothyroidism,  past history of stroke who was admitted to the hospital after she got unresponsive at home and was found to be in acute on chronic hypoxic hypercarbic respiratory failure due to combination of COPD and CHF exacerbation.                                                                 Hospital Course      1. Metabolic encephalopathy cause due to acute on chronic hypoxic respiratory failure due to combination of acute and chronic COPD exacerbation along with acute on chronic Diastolic CHF ef 70%.  Patient much improved after IV Lasix (-3lits) , IV Solu-Medrol and BiPAP for 12 hours after admission, she is now at her baseline, ambulating in the hallway without any distress and eager to go home, now down to nasal cannula oxygen,  note she uses 3-4 L nasal Oxygen  at home. Continue with Coreg, will increase her Lasix from 40 mg daily to 40 mg twice a day upon discharge along with gentle oral steroid taper, continue fluid restriction at home - written instructions provided. Noted stable recent Echocardiogram. We'll discharge with outpatient follow-up with PCP and primary pulmonologist post discharge in 1-2 weeks.  2. Hypertension. Currently on Coreg and diuretics Continue.  3. DM type II. Continue home regimen PCP to monitor A1c and glycemic control.  Lab Results  Component Value Date   HGBA1C 6.1 (H) 12/02/2016   CBG (last 3)   Recent Labs  12/04/16 1644 12/04/16 2124 12/05/16 0752  GLUCAP 138* 163* 153*     Discharge diagnosis     Principal Problem:   Acute respiratory failure with hypercapnia (HCC) Active Problems:   Acute on chronic diastolic heart failure (HCC)   Diabetes mellitus, type 2 (HCC)   History of smoking 25-50  pack years   Respiratory failure with hypoxia and hypercapnia (HCC)    Discharge instructions    Discharge Instructions    Diet - low sodium heart healthy    Complete by:  As directed    Discharge instructions    Complete by:  As directed    Follow with Primary MD Wardell Honour, MD in 3 days, use 4 L nasal cannula oxygen to you follow with Dr. Luan Pulling   Get CBC, CMP, 2 view Chest X ray checked  by Primary MD  in 3 days ( we routinely change or add medications that can affect your baseline labs and fluid status, therefore we recommend that you get the mentioned basic workup next visit with your PCP, your PCP may decide not to get them or add new tests based on their clinical decision)  Activity: As tolerated with Full fall precautions use walker/cane & assistance as needed  Disposition Home    Diet:   Diet heart healthy/carb modified Room, Fluid restriction: 1500 mL Fluid  For Heart failure patients - Check your Weight same time everyday, if you gain over 2 pounds, or you develop in leg swelling,  experience more shortness of breath or chest pain, call your Primary MD immediately. Follow Cardiac Low Salt Diet and 1.5 lit/day fluid restriction.  On your next visit with your primary care physician please Get Medicines reviewed and adjusted.  Please request your Prim.MD to go over all Hospital Tests and Procedure/Radiological results at the follow up, please get all Hospital records sent to your Prim MD by signing hospital release before you go home.  If you experience worsening of your admission symptoms, develop shortness of breath, life threatening emergency, suicidal or homicidal thoughts you must seek medical attention immediately by calling 911 or calling your MD immediately  if symptoms less severe.  You Must read complete instructions/literature along with all the possible adverse reactions/side effects for all the Medicines you take and that have been prescribed to you. Take any new Medicines after you have completely understood and accpet all the possible adverse reactions/side effects.   Do not drive, operate heavy machinery, perform activities at heights, swimming or participation in water activities or provide baby sitting services if your were admitted for syncope or siezures until you have seen by Primary MD or a Neurologist and advised to do so again.  Do not drive when taking Pain medications.    Do not take more than prescribed Pain, Sleep and Anxiety Medications  Special Instructions: If you have smoked or chewed Tobacco  in the last 2 yrs please stop smoking, stop any regular Alcohol  and or any Recreational drug use.  Wear Seat belts while driving.   Please note  You were cared for by a hospitalist during your hospital stay. If you have any questions about your discharge medications or the care you received while you were in the hospital after you are discharged, you can call the unit and asked to speak with the hospitalist on call if the hospitalist that took care of  you is not available. Once you are discharged, your primary care physician will handle any further medical issues. Please note that NO REFILLS for any discharge medications will be authorized once you are discharged, as it is imperative that you return to your primary care physician (or establish a relationship with a primary care physician if you do not have one) for your aftercare needs so that they can reassess your need for  medications and monitor your lab values.   Increase activity slowly    Complete by:  As directed       Discharge Medications   Allergies as of 12/05/2016   No Known Allergies     Medication List    STOP taking these medications   predniSONE 10 MG tablet Commonly known as:  DELTASONE Replaced by:  predniSONE 5 MG tablet     TAKE these medications   albuterol 108 (90 Base) MCG/ACT inhaler Commonly known as:  PROVENTIL HFA;VENTOLIN HFA Inhale 1-2 puffs into the lungs every 6 (six) hours as needed for wheezing or shortness of breath.   albuterol (2.5 MG/3ML) 0.083% nebulizer solution Commonly known as:  PROVENTIL Inhale 3 mLs into the lungs 4 (four) times daily as needed for shortness of breath.   alendronate 70 MG tablet Commonly known as:  FOSAMAX Take 1 tablet (70 mg total) by mouth once a week. Take with a full glass of water on an empty stomach.   carvedilol 3.125 MG tablet Commonly known as:  COREG TAKE 1 TABLET BY MOUTH TWICE DAILY   fluticasone furoate-vilanterol 100-25 MCG/INH Aepb Commonly known as:  BREO ELLIPTA Inhale 1 puff into the lungs daily.   furosemide 40 MG tablet Commonly known as:  LASIX Take 1 tablet (40 mg total) by mouth 2 (two) times daily. TAKE 1 TABLET BY MOUTH ONCE DAILY What changed:  how much to take  how to take this  when to take this   guaiFENesin 600 MG 12 hr tablet Commonly known as:  MUCINEX Take 1 tablet (600 mg total) by mouth 2 (two) times daily.   metFORMIN 500 MG tablet Commonly known as:   GLUCOPHAGE Take 1 tablet (500 mg total) by mouth daily.   predniSONE 5 MG tablet Commonly known as:  DELTASONE Label  & dispense according to the schedule below. Take 8 Pills PO for 3 days, 6 Pills PO for 3 days, 4 Pills PO for 3 days, 2 Pills PO for 3 days, 1 Pills PO for 3 days, 1/2 Pill  PO for 3 days then STOP. Total 65 pills. Replaces:  predniSONE 10 MG tablet   RESTASIS MULTIDOSE 0.05 % ophthalmic emulsion Generic drug:  cycloSPORINE Place 1 drop into both eyes 2 (two) times daily.   spironolactone 25 MG tablet Commonly known as:  ALDACTONE Take 1 tablet (25 mg total) by mouth daily.   Vitamin D3 2000 units Tabs Take 1 tablet by mouth daily.       Follow-up Information    MILLER, Lillette Boxer, MD. Schedule an appointment as soon as possible for a visit in 1 week(s).   Specialty:  Family Medicine Contact information: [place] [address] [phone]        Alonza Bogus, MD Follow up.   Specialty:  Pulmonary Disease Contact information: [place] [address] [phone]           Major procedures and Radiology Reports - PLEASE review detailed and final reports thoroughly  -         Dg Chest 2 View  Result Date: 12/03/2016 CLINICAL DATA:  Shortness of breath. EXAM: CHEST  2 VIEW COMPARISON:  12/02/2016. 09/28/2016. CT 07/19/2016, 12/14/2015, 03/22/2012. FINDINGS: Mediastinum and hilar structures normal. Stable cardiomegaly. Interim improvement of pulmonary venous congestion suggesting improving CHF. Mild persistent interstitial prominence noted bilaterally no pleural effusion or pneumothorax. COPD cannot be excluded. Persistent thyroid enlargement with calcification again noted. No  acute bony abnormality. IMPRESSION: 1. Stable cardiomegaly. Interim improvement of pulmonary venous congestion. Mild residual interstitial prominence. 2. COPD cannot be excluded. Electronically Signed   By: Marcello Moores  Register   On:  12/03/2016 14:44   Dg Chest Portable 1 View  Result Date: 12/02/2016 CLINICAL DATA:  Dyspnea for several days at home. EXAM: PORTABLE CHEST 1 VIEW COMPARISON:  09/28/2016 FINDINGS: Hyperinflation and cardiomegaly. Interstitial fluid or edema. Patchy opacities in the central and basilar regions, right greater than left. This may represent asymmetric alveolar edema. Cannot exclude pneumonia. No large effusions. Upper lobe emphysematous changes. IMPRESSION: Central and basilar opacities, right greater than left, alveolar edema versus infectious infiltrate. There also is mild interstitial prominence and this could all represent CHF superimposed on COPD. Electronically Signed   By: Andreas Newport M.D.   On: 12/02/2016 23:15    Micro Results     Recent Results (from the past 240 hour(s))  MRSA PCR Screening     Status: None   Collection Time: 12/03/16  3:42 AM  Result Value Ref Range Status   MRSA by PCR NEGATIVE NEGATIVE Final    Comment:        The GeneXpert MRSA Assay (FDA approved for NASAL specimens only), is one component of a comprehensive MRSA colonization surveillance program. It is not intended to diagnose MRSA infection nor to guide or monitor treatment for MRSA infections.     Today   Subjective    Sonya Oliver today has no headache,no chest abdominal pain,no new weakness tingling or numbness, feels much better wants to go home today.     Objective   Blood pressure (!) 131/54, pulse 65, temperature 98.5 F (36.9 C), temperature source Oral, resp. rate 18, height '5\' 3"'$  (1.6 m), weight 75.8 kg (167 lb 1.7 oz), SpO2 97 %.   Intake/Output Summary (Last 24 hours) at 12/05/16 1020 Last data filed at 12/05/16 0918  Gross per 24 hour  Intake              480 ml  Output              650 ml  Net             -170 ml    Exam Awake Alert, Oriented x 3, No new F.N deficits, Normal affect [place].AT,PERRAL Supple Neck,No JVD, No cervical lymphadenopathy appriciated.   Symmetrical Chest wall movement, Good air movement bilaterally, CTAB RRR,No Gallops,Rubs or new Murmurs, No Parasternal Heave +ve B.Sounds, Abd Soft, Non tender, No organomegaly appriciated, No rebound -guarding or rigidity. No Cyanosis, Clubbing or edema, No new Rash or bruise   Data Review   CBC w Diff:  Lab Results  Component Value Date   WBC 5.2 12/02/2016   HGB 14.0 12/02/2016   HCT 44.0 12/02/2016   PLT 191 12/02/2016   LYMPHOPCT 35 12/02/2016   MONOPCT 10 12/02/2016   EOSPCT 10 12/02/2016   BASOPCT 0 12/02/2016    CMP:  Lab Results  Component Value Date   NA 137 12/05/2016   NA 142 06/10/2016   K 4.2 12/05/2016   CL 92 (L) 12/05/2016   CO2 35 (H) 12/05/2016   BUN 29 (H) 12/05/2016   BUN 17 06/10/2016   CREATININE 0.78 12/05/2016   CREATININE 0.79 03/22/2013   PROT 9.1 (H) 12/02/2016   PROT 7.7 11/13/2015   ALBUMIN 4.4 12/02/2016   ALBUMIN 3.9 11/13/2015   BILITOT 0.9 12/02/2016   BILITOT 0.5 11/13/2015   ALKPHOS 56 12/02/2016  AST 20 12/02/2016   ALT 10 (L) 12/02/2016  .   Total Time in preparing paper work, data evaluation and todays exam - 85 minutes  Lala Lund M.D on 12/05/2016 at 10:20 AM  [facility]   Office  [phone]

## 2016-12-07 ENCOUNTER — Telehealth: Payer: Self-pay | Admitting: *Deleted

## 2016-12-10 ENCOUNTER — Ambulatory Visit: Payer: Medicare Other | Admitting: Family Medicine

## 2016-12-13 ENCOUNTER — Encounter: Payer: Self-pay | Admitting: Family Medicine

## 2016-12-22 ENCOUNTER — Ambulatory Visit (INDEPENDENT_AMBULATORY_CARE_PROVIDER_SITE_OTHER): Payer: Medicare Other | Admitting: Physician Assistant

## 2016-12-22 ENCOUNTER — Encounter: Payer: Self-pay | Admitting: Physician Assistant

## 2016-12-22 ENCOUNTER — Ambulatory Visit (INDEPENDENT_AMBULATORY_CARE_PROVIDER_SITE_OTHER): Payer: Medicare Other

## 2016-12-22 VITALS — BP 114/60 | HR 76 | Temp 96.8°F | Ht 63.0 in | Wt 171.8 lb

## 2016-12-22 DIAGNOSIS — E876 Hypokalemia: Secondary | ICD-10-CM

## 2016-12-22 DIAGNOSIS — M810 Age-related osteoporosis without current pathological fracture: Secondary | ICD-10-CM

## 2016-12-22 DIAGNOSIS — J441 Chronic obstructive pulmonary disease with (acute) exacerbation: Secondary | ICD-10-CM

## 2016-12-22 MED ORDER — ALBUTEROL SULFATE HFA 108 (90 BASE) MCG/ACT IN AERS: 1.0000 | INHALATION_SPRAY | Freq: Four times a day (QID) | RESPIRATORY_TRACT | 11 refills | Status: DC | PRN

## 2016-12-22 MED ORDER — ALENDRONATE SODIUM 70 MG PO TABS: 70.0000 mg | ORAL_TABLET | ORAL | 11 refills | Status: DC

## 2016-12-22 NOTE — Patient Instructions (Signed)
Chronic Obstructive Pulmonary Disease Chronic obstructive pulmonary disease (COPD) is a common lung condition in which airflow from the lungs is limited. COPD is a general term that can be used to describe many different lung problems that limit airflow, including both chronic bronchitis and emphysema. If you have COPD, your lung function will probably never return to normal, but there are measures you can take to improve lung function and make yourself feel better. What are the causes?  Smoking (common).  Exposure to secondhand smoke.  Genetic problems.  Chronic inflammatory lung diseases or recurrent infections. What are the signs or symptoms?  Shortness of breath, especially with physical activity.  Deep, persistent (chronic) cough with a large amount of thick mucus.  Wheezing.  Rapid breaths (tachypnea).  Gray or bluish discoloration (cyanosis) of the skin, especially in your fingers, toes, or lips.  Fatigue.  Weight loss.  Frequent infections or episodes when breathing symptoms become much worse (exacerbations).  Chest tightness. How is this diagnosed? Your health care provider will take a medical history and perform a physical examination to diagnose COPD. Additional tests for COPD may include:  Lung (pulmonary) function tests.  Chest X-ray.  CT scan.  Blood tests. How is this treated? Treatment for COPD may include:  Inhaler and nebulizer medicines. These help manage the symptoms of COPD and make your breathing more comfortable.  Supplemental oxygen. Supplemental oxygen is only helpful if you have a low oxygen level in your blood.  Exercise and physical activity. These are beneficial for nearly all people with COPD.  Lung surgery or transplant.  Nutrition therapy to gain weight, if you are underweight.  Pulmonary rehabilitation. This may involve working with a team of health care providers and specialists, such as respiratory, occupational, and physical  therapists. Follow these instructions at home:  Take all medicines (inhaled or pills) as directed by your health care provider.  Avoid over-the-counter medicines or cough syrups that dry up your airway (such as antihistamines) and slow down the elimination of secretions unless instructed otherwise by your health care provider.  If you are a smoker, the most important thing that you can do is stop smoking. Continuing to smoke will cause further lung damage and breathing trouble. Ask your health care provider for help with quitting smoking. He or she can direct you to community resources or hospitals that provide support.  Avoid exposure to irritants such as smoke, chemicals, and fumes that aggravate your breathing.  Use oxygen therapy and pulmonary rehabilitation if directed by your health care provider. If you require home oxygen therapy, ask your health care provider whether you should purchase a pulse oximeter to measure your oxygen level at home.  Avoid contact with individuals who have a contagious illness.  Avoid extreme temperature and humidity changes.  Eat healthy foods. Eating smaller, more frequent meals and resting before meals may help you maintain your strength.  Stay active, but balance activity with periods of rest. Exercise and physical activity will help you maintain your ability to do things you want to do.  Preventing infection and hospitalization is very important when you have COPD. Make sure to receive all the vaccines your health care provider recommends, especially the pneumococcal and influenza vaccines. Ask your health care provider whether you need a pneumonia vaccine.  Learn and use relaxation techniques to manage stress.  Learn and use controlled breathing techniques as directed by your health care provider. Controlled breathing techniques include: 1. Pursed lip breathing. Start by breathing in (inhaling)   through your nose for 1 second. Then, purse your lips as  if you were going to whistle and breathe out (exhale) through the pursed lips for 2 seconds. 2. Diaphragmatic breathing. Start by putting one hand on your abdomen just above your waist. Inhale slowly through your nose. The hand on your abdomen should move out. Then purse your lips and exhale slowly. You should be able to feel the hand on your abdomen moving in as you exhale.  Learn and use controlled coughing to clear mucus from your lungs. Controlled coughing is a series of short, progressive coughs. The steps of controlled coughing are: 1. Lean your head slightly forward. 2. Breathe in deeply using diaphragmatic breathing. 3. Try to hold your breath for 3 seconds. 4. Keep your mouth slightly open while coughing twice. 5. Spit any mucus out into a tissue. 6. Rest and repeat the steps once or twice as needed. Contact a health care provider if:  You are coughing up more mucus than usual.  There is a change in the color or thickness of your mucus.  Your breathing is more labored than usual.  Your breathing is faster than usual. Get help right away if:  You have shortness of breath while you are resting.  You have shortness of breath that prevents you from:  Being able to talk.  Performing your usual physical activities.  You have chest pain lasting longer than 5 minutes.  Your skin color is more cyanotic than usual.  You measure low oxygen saturations for longer than 5 minutes with a pulse oximeter. This information is not intended to replace advice given to you by your health care provider. Make sure you discuss any questions you have with your health care provider. Document Released: 05/26/2005 Document Revised: 01/22/2016 Document Reviewed: 04/12/2013 Elsevier Interactive Patient Education  2017 Elsevier Inc.  

## 2016-12-23 LAB — CMP14+EGFR
A/G RATIO: 1.3 (ref 1.2–2.2)
ALT: 8 IU/L (ref 0–32)
AST: 14 IU/L (ref 0–40)
Albumin: 4.2 g/dL (ref 3.5–4.8)
Alkaline Phosphatase: 51 IU/L (ref 39–117)
BUN/Creatinine Ratio: 18 (ref 12–28)
BUN: 16 mg/dL (ref 8–27)
Bilirubin Total: 0.8 mg/dL (ref 0.0–1.2)
CHLORIDE: 92 mmol/L — AB (ref 96–106)
CO2: 30 mmol/L — ABNORMAL HIGH (ref 18–29)
Calcium: 9.4 mg/dL (ref 8.7–10.3)
Creatinine, Ser: 0.89 mg/dL (ref 0.57–1.00)
GFR calc Af Amer: 72 mL/min/{1.73_m2} (ref >59)
GFR calc non Af Amer: 63 mL/min/{1.73_m2} (ref >59)
Globulin, Total: 3.3 g/dL (ref 1.5–4.5)
Glucose: 104 mg/dL — ABNORMAL HIGH (ref 65–99)
POTASSIUM: 3.8 mmol/L (ref 3.5–5.2)
Sodium: 139 mmol/L (ref 134–144)
Total Protein: 7.5 g/dL (ref 6.0–8.5)

## 2016-12-23 NOTE — Progress Notes (Signed)
BP 114/60   Pulse 76   Temp (!) 96.8 F (36 C) (Oral)   Ht 5' 3"  (1.6 m)   Wt 171 lb 12.8 oz (77.9 kg)   BMI 30.43 kg/m    Subjective:    Patient ID: Sonya Oliver, female    DOB: [birth year], 76 y.o.   MRN: [id number]  HPI: Sonya Oliver is a 76 y.o. female presenting on 12/22/2016 for Hospitalization Follow-up  This patient comes in for periodic recheck on medications and conditions including post hospital follow for COPD and respiratory failure. She is feeling much better and is still taking her medications.  She is feeling better now. We need to update her CXR and labs.   All medications are reviewed today. There are no reports of any problems with the medications. All of the medical conditions are reviewed and updated.  Lab work is reviewed and will be ordered as medically necessary. There are no new problems reported with today's visit.   Relevant past medical, surgical, family and social history reviewed and updated as indicated. Allergies and medications reviewed and updated.  Past Medical History:  Diagnosis Date  . Arthritis   . Bladder tumor   . Cancer ([place])    bladder  . Cataract   . CHF (congestive heart failure) ([place])    NO CARDIOLOGIST---  MONITORED BY PCP DR Jacelyn Grip  . COPD with emphysema ([place])    last Acute Exacerbation 08-21-2014  . Dyspnea on exertion   . H/O pulmonary edema    jan 2014--  acute CHF w/ pulmonary edema  . Hypertension   . Hyperthyroidism    currently no meds -- labs stable  . Nocturia   . Nocturnal oxygen desaturation    uses O2  HS via [place]--  and PRN daytime  . Osteoporosis   . Stroke ([place])   . Type 2 diabetes mellitus ([place])   . Ureteral tumor     Past Surgical History:  Procedure Laterality Date  . ABDOMINAL HYSTERECTOMY  1980'S   W/ BILATERAL SALPINGO-OOPHORECTOMY  . CATARACT EXTRACTION W/PHACO Left 01/31/2014   Procedure: CATARACT EXTRACTION PHACO AND INTRAOCULAR LENS PLACEMENT (IOC);  Surgeon: Tonny Branch, MD;  Location: [facility];  Service: Ophthalmology;  Laterality: Left;  CDE: 23.78  . CATARACT EXTRACTION W/PHACO Right 02/28/2014   Procedure: CATARACT EXTRACTION PHACO AND INTRAOCULAR LENS PLACEMENT (IOC);  Surgeon: Tonny Branch, MD;  Location: [facility];  Service: Ophthalmology;  Laterality: Right;  CDE:  11.34  . CYSTOSCOPY  03/23/2012   Procedure: CYSTOSCOPY;  Surgeon: Claybon Jabs, MD;  Location: [facility];  Service: Urology;  Laterality: N/A;  . CYSTOSCOPY WITH RETROGRADE PYELOGRAM, URETEROSCOPY AND STENT PLACEMENT Right 10/21/2014   Procedure: CYSTOSCOPY WITH RETROGRADE PYELOGRAM, URETEROSCOPY, BLADDER TUMOR BIOPSY;  Surgeon: Claybon Jabs, MD;  Location: [facility];  Service: Urology;  Laterality: Right;  . TRANSTHORACIC ECHOCARDIOGRAM  06-21-2013   moderate LVH,  grade I diastolic dysfunction, ef 77-93%,  mild MR & TR  . TRANSURETHRAL RESECTION OF BLADDER TUMOR  03/23/2012   Procedure: TRANSURETHRAL RESECTION OF BLADDER TUMOR (TURBT);  Surgeon: Claybon Jabs, MD;  Location: [facility];  Service: Urology;  Laterality: N/A;  . TRANSURETHRAL RESECTION OF BLADDER TUMOR N/A 01/08/2013   Procedure: TRANSURETHRAL RESECTION OF BLADDER TUMOR (TURBT);  Surgeon: Claybon Jabs, MD;  Location: [facility];  Service: Urology;  Laterality: N/A;    Review of Systems  Constitutional: Negative.  Negative for activity change, fatigue  and fever.  HENT: Negative.   Eyes: Negative.   Respiratory: Negative.  Negative for cough.   Cardiovascular: Negative.  Negative for chest pain.  Gastrointestinal: Negative.  Negative for abdominal pain.  Endocrine: Negative.   Genitourinary: Negative.  Negative for dysuria.  Musculoskeletal: Negative.   Skin: Negative.   Neurological: Negative.     Allergies as of 12/22/2016   No Known Allergies     Medication List       Accurate as of 12/22/16 11:59 PM. Always use your most recent med list.          albuterol (2.5 MG/3ML) 0.083% nebulizer solution Commonly  known as:  PROVENTIL Inhale 3 mLs into the lungs 4 (four) times daily as needed for shortness of breath.   albuterol 108 (90 Base) MCG/ACT inhaler Commonly known as:  PROVENTIL HFA;VENTOLIN HFA Inhale 1-2 puffs into the lungs every 6 (six) hours as needed for wheezing or shortness of breath.   alendronate 70 MG tablet Commonly known as:  FOSAMAX Take 1 tablet (70 mg total) by mouth once a week. Take with a full glass of water on an empty stomach.   carvedilol 3.125 MG tablet Commonly known as:  COREG TAKE 1 TABLET BY MOUTH TWICE DAILY   fluticasone furoate-vilanterol 100-25 MCG/INH Aepb Commonly known as:  BREO ELLIPTA Inhale 1 puff into the lungs daily.   furosemide 40 MG tablet Commonly known as:  LASIX Take 1 tablet (40 mg total) by mouth 2 (two) times daily. TAKE 1 TABLET BY MOUTH ONCE DAILY   guaiFENesin 600 MG 12 hr tablet Commonly known as:  MUCINEX Take 1 tablet (600 mg total) by mouth 2 (two) times daily.   metFORMIN 500 MG tablet Commonly known as:  GLUCOPHAGE Take 1 tablet (500 mg total) by mouth daily.   potassium chloride SA 20 MEQ tablet Commonly known as:  K-DUR,KLOR-CON Take 20 mEq by mouth daily.   predniSONE 5 MG tablet Commonly known as:  DELTASONE Label  & dispense according to the schedule below. Take 8 Pills PO for 3 days, 6 Pills PO for 3 days, 4 Pills PO for 3 days, 2 Pills PO for 3 days, 1 Pills PO for 3 days, 1/2 Pill  PO for 3 days then STOP. Total 65 pills.   RESTASIS MULTIDOSE 0.05 % ophthalmic emulsion Generic drug:  cycloSPORINE Place 1 drop into both eyes 2 (two) times daily.   spironolactone 25 MG tablet Commonly known as:  ALDACTONE Take 1 tablet (25 mg total) by mouth daily.   Vitamin D3 2000 units Tabs Take 1 tablet by mouth daily.          Objective:    BP 114/60   Pulse 76   Temp (!) 96.8 F (36 C) (Oral)   Ht 5' 3"  (1.6 m)   Wt 171 lb 12.8 oz (77.9 kg)   BMI 30.43 kg/m   No Known Allergies  Physical Exam    Constitutional: She is oriented to person, place, and time. She appears well-developed and well-nourished.  HENT:  Head: Normocephalic and atraumatic.  Right Ear: Tympanic membrane, external ear and ear canal normal.  Left Ear: Tympanic membrane, external ear and ear canal normal.  Nose: Nose normal. No rhinorrhea.  Mouth/Throat: Oropharynx is clear and moist and mucous membranes are normal. No oropharyngeal exudate or posterior oropharyngeal erythema.  Eyes: Conjunctivae and EOM are normal. Pupils are equal, round, and reactive to light.  Neck: Normal range of motion. Neck supple.  Cardiovascular:  Normal rate, regular rhythm, normal heart sounds and intact distal pulses.   Pulmonary/Chest: Effort normal and breath sounds normal.  Abdominal: Soft. Bowel sounds are normal.  Neurological: She is alert and oriented to person, place, and time. She has normal reflexes.  Skin: Skin is warm and dry. No rash noted.  Psychiatric: She has a normal mood and affect. Her behavior is normal. Judgment and thought content normal.    Results for orders placed or performed in visit on 12/22/16  CMP14+EGFR  Result Value Ref Range   Glucose 104 (H) 65 - 99 mg/dL   BUN 16 8 - 27 mg/dL   Creatinine, Ser 0.89 0.57 - 1.00 mg/dL   GFR calc non Af Amer 63 >59 mL/min/1.73   GFR calc Af Amer 72 >59 mL/min/1.73   BUN/Creatinine Ratio 18 12 - 28   Sodium 139 134 - 144 mmol/L   Potassium 3.8 3.5 - 5.2 mmol/L   Chloride 92 (L) 96 - 106 mmol/L   CO2 30 (H) 18 - 29 mmol/L   Calcium 9.4 8.7 - 10.3 mg/dL   Total Protein 7.5 6.0 - 8.5 g/dL   Albumin 4.2 3.5 - 4.8 g/dL   Globulin, Total 3.3 1.5 - 4.5 g/dL   Albumin/Globulin Ratio 1.3 1.2 - 2.2   Bilirubin Total 0.8 0.0 - 1.2 mg/dL   Alkaline Phosphatase 51 39 - 117 IU/L   AST 14 0 - 40 IU/L   ALT 8 0 - 32 IU/L      Assessment & Plan:   1. Chronic obstructive pulmonary disease with acute exacerbation ([place]) - DG Chest 2 View; Future - albuterol (PROVENTIL  HFA;VENTOLIN HFA) 108 (90 Base) MCG/ACT inhaler; Inhale 1-2 puffs into the lungs every 6 (six) hours as needed for wheezing or shortness of breath.  Dispense: 1 Inhaler; Refill: 11  2. Hypokalemia - potassium chloride SA (K-DUR,KLOR-CON) 20 MEQ tablet; Take 20 mEq by mouth daily. - CMP14+EGFR - DG Chest 2 View; Future  3. Age related osteoporosis, unspecified pathological fracture presence - alendronate (FOSAMAX) 70 MG tablet; Take 1 tablet (70 mg total) by mouth once a week. Take with a full glass of water on an empty stomach.  Dispense: 4 tablet; Refill: 11   Continue all other maintenance medications as listed above.  Follow up plan: Return in about 2 months (around 02/21/2017) for recheck.  Educational handout given for COPD  Terald Sleeper PA-C [facility] [address]  [place], [place] [address] [phone]   12/23/2016, 8:46 PM

## 2017-02-21 ENCOUNTER — Encounter: Payer: Self-pay | Admitting: Physician Assistant

## 2017-02-21 ENCOUNTER — Ambulatory Visit (INDEPENDENT_AMBULATORY_CARE_PROVIDER_SITE_OTHER): Payer: Medicare Other | Admitting: Physician Assistant

## 2017-02-21 VITALS — BP 117/68 | HR 69 | Temp 96.7°F | Ht 66.0 in | Wt 175.2 lb

## 2017-02-21 DIAGNOSIS — J441 Chronic obstructive pulmonary disease with (acute) exacerbation: Secondary | ICD-10-CM

## 2017-02-21 DIAGNOSIS — E118 Type 2 diabetes mellitus with unspecified complications: Secondary | ICD-10-CM

## 2017-02-21 DIAGNOSIS — M810 Age-related osteoporosis without current pathological fracture: Secondary | ICD-10-CM | POA: Diagnosis not present

## 2017-02-21 DIAGNOSIS — E0590 Thyrotoxicosis, unspecified without thyrotoxic crisis or storm: Secondary | ICD-10-CM | POA: Diagnosis not present

## 2017-02-21 NOTE — Patient Instructions (Signed)
DASH Eating Plan DASH stands for "Dietary Approaches to Stop Hypertension." The DASH eating plan is a healthy eating plan that has been shown to reduce high blood pressure (hypertension). It may also reduce your risk for type 2 diabetes, heart disease, and stroke. The DASH eating plan may also help with weight loss. What are tips for following this plan? General guidelines  Avoid eating more than 2,300 mg (milligrams) of salt (sodium) a day. If you have hypertension, you may need to reduce your sodium intake to 1,500 mg a day.  Limit alcohol intake to no more than 1 drink a day for nonpregnant women and 2 drinks a day for men. One drink equals 12 oz of beer, 5 oz of wine, or 1 oz of hard liquor.  Work with your health care provider to maintain a healthy body weight or to lose weight. Ask what an ideal weight is for you.  Get at least 30 minutes of exercise that causes your heart to beat faster (aerobic exercise) most days of the week. Activities may include walking, swimming, or biking.  Work with your health care provider or diet and nutrition specialist (dietitian) to adjust your eating plan to your individual calorie needs. Reading food labels  Check food labels for the amount of sodium per serving. Choose foods with less than 5 percent of the Daily Value of sodium. Generally, foods with less than 300 mg of sodium per serving fit into this eating plan.  To find whole grains, look for the word "whole" as the first word in the ingredient list. Shopping  Buy products labeled as "low-sodium" or "no salt added."  Buy fresh foods. Avoid canned foods and premade or frozen meals. Cooking  Avoid adding salt when cooking. Use salt-free seasonings or herbs instead of table salt or sea salt. Check with your health care provider or pharmacist before using salt substitutes.  Do not fry foods. Cook foods using healthy methods such as baking, boiling, grilling, and broiling instead.  Cook with  heart-healthy oils, such as olive, canola, soybean, or sunflower oil. Meal planning   Eat a balanced diet that includes: ? 5 or more servings of fruits and vegetables each day. At each meal, try to fill half of your plate with fruits and vegetables. ? Up to 6-8 servings of whole grains each day. ? Less than 6 oz of lean meat, poultry, or fish each day. A 3-oz serving of meat is about the same size as a deck of cards. One egg equals 1 oz. ? 2 servings of low-fat dairy each day. ? A serving of nuts, seeds, or beans 5 times each week. ? Heart-healthy fats. Healthy fats called Omega-3 fatty acids are found in foods such as flaxseeds and coldwater fish, like sardines, salmon, and mackerel.  Limit how much you eat of the following: ? Canned or prepackaged foods. ? Food that is high in trans fat, such as fried foods. ? Food that is high in saturated fat, such as fatty meat. ? Sweets, desserts, sugary drinks, and other foods with added sugar. ? Full-fat dairy products.  Do not salt foods before eating.  Try to eat at least 2 vegetarian meals each week.  Eat more home-cooked food and less restaurant, buffet, and fast food.  When eating at a restaurant, ask that your food be prepared with less salt or no salt, if possible. What foods are recommended? The items listed may not be a complete list. Talk with your dietitian about what   dietary choices are best for you. Grains Whole-grain or whole-wheat bread. Whole-grain or whole-wheat pasta. Brown rice. Oatmeal. Quinoa. Bulgur. Whole-grain and low-sodium cereals. Pita bread. Low-fat, low-sodium crackers. Whole-wheat flour tortillas. Vegetables Fresh or frozen vegetables (raw, steamed, roasted, or grilled). Low-sodium or reduced-sodium tomato and vegetable juice. Low-sodium or reduced-sodium tomato sauce and tomato paste. Low-sodium or reduced-sodium canned vegetables. Fruits All fresh, dried, or frozen fruit. Canned fruit in natural juice (without  added sugar). Meat and other protein foods Skinless chicken or turkey. Ground chicken or turkey. Pork with fat trimmed off. Fish and seafood. Egg whites. Dried beans, peas, or lentils. Unsalted nuts, nut butters, and seeds. Unsalted canned beans. Lean cuts of beef with fat trimmed off. Low-sodium, lean deli meat. Dairy Low-fat (1%) or fat-free (skim) milk. Fat-free, low-fat, or reduced-fat cheeses. Nonfat, low-sodium ricotta or cottage cheese. Low-fat or nonfat yogurt. Low-fat, low-sodium cheese. Fats and oils Soft margarine without trans fats. Vegetable oil. Low-fat, reduced-fat, or light mayonnaise and salad dressings (reduced-sodium). Canola, safflower, olive, soybean, and sunflower oils. Avocado. Seasoning and other foods Herbs. Spices. Seasoning mixes without salt. Unsalted popcorn and pretzels. Fat-free sweets. What foods are not recommended? The items listed may not be a complete list. Talk with your dietitian about what dietary choices are best for you. Grains Baked goods made with fat, such as croissants, muffins, or some breads. Dry pasta or rice meal packs. Vegetables Creamed or fried vegetables. Vegetables in a cheese sauce. Regular canned vegetables (not low-sodium or reduced-sodium). Regular canned tomato sauce and paste (not low-sodium or reduced-sodium). Regular tomato and vegetable juice (not low-sodium or reduced-sodium). Pickles. Olives. Fruits Canned fruit in a light or heavy syrup. Fried fruit. Fruit in cream or butter sauce. Meat and other protein foods Fatty cuts of meat. Ribs. Fried meat. Bacon. Sausage. Bologna and other processed lunch meats. Salami. Fatback. Hotdogs. Bratwurst. Salted nuts and seeds. Canned beans with added salt. Canned or smoked fish. Whole eggs or egg yolks. Chicken or turkey with skin. Dairy Whole or 2% milk, cream, and half-and-half. Whole or full-fat cream cheese. Whole-fat or sweetened yogurt. Full-fat cheese. Nondairy creamers. Whipped toppings.  Processed cheese and cheese spreads. Fats and oils Butter. Stick margarine. Lard. Shortening. Ghee. Bacon fat. Tropical oils, such as coconut, palm kernel, or palm oil. Seasoning and other foods Salted popcorn and pretzels. Onion salt, garlic salt, seasoned salt, table salt, and sea salt. Worcestershire sauce. Tartar sauce. Barbecue sauce. Teriyaki sauce. Soy sauce, including reduced-sodium. Steak sauce. Canned and packaged gravies. Fish sauce. Oyster sauce. Cocktail sauce. Horseradish that you find on the shelf. Ketchup. Mustard. Meat flavorings and tenderizers. Bouillon cubes. Hot sauce and Tabasco sauce. Premade or packaged marinades. Premade or packaged taco seasonings. Relishes. Regular salad dressings. Where to find more information:  National Heart, Lung, and Blood Institute: www.nhlbi.nih.gov  American Heart Association: www.heart.org Summary  The DASH eating plan is a healthy eating plan that has been shown to reduce high blood pressure (hypertension). It may also reduce your risk for type 2 diabetes, heart disease, and stroke.  With the DASH eating plan, you should limit salt (sodium) intake to 2,300 mg a day. If you have hypertension, you may need to reduce your sodium intake to 1,500 mg a day.  When on the DASH eating plan, aim to eat more fresh fruits and vegetables, whole grains, lean proteins, low-fat dairy, and heart-healthy fats.  Work with your health care provider or diet and nutrition specialist (dietitian) to adjust your eating plan to your individual   calorie needs. This information is not intended to replace advice given to you by your health care provider. Make sure you discuss any questions you have with your health care provider. Document Released: 08/05/2011 Document Revised: 08/09/2016 Document Reviewed: 08/09/2016 Elsevier Interactive Patient Education  2017 [employer] Inc.  

## 2017-02-22 LAB — LIPID PANEL
CHOL/HDL RATIO: 2.1 ratio (ref 0.0–4.4)
Cholesterol, Total: 218 mg/dL — ABNORMAL HIGH (ref 100–199)
HDL: 105 mg/dL (ref >39)
LDL CALC: 104 mg/dL — AB (ref 0–99)
Triglycerides: 45 mg/dL (ref 0–149)
VLDL CHOLESTEROL CAL: 9 mg/dL (ref 5–40)

## 2017-02-22 LAB — CMP14+EGFR
ALBUMIN: 4.3 g/dL (ref 3.5–4.8)
ALK PHOS: 55 IU/L (ref 39–117)
ALT: 9 IU/L (ref 0–32)
AST: 16 IU/L (ref 0–40)
Albumin/Globulin Ratio: 1.3 (ref 1.2–2.2)
BILIRUBIN TOTAL: 0.6 mg/dL (ref 0.0–1.2)
BUN / CREAT RATIO: 15 (ref 12–28)
BUN: 13 mg/dL (ref 8–27)
CO2: 26 mmol/L (ref 20–29)
CREATININE: 0.84 mg/dL (ref 0.57–1.00)
Calcium: 9.7 mg/dL (ref 8.7–10.3)
Chloride: 101 mmol/L (ref 96–106)
GFR calc Af Amer: 78 mL/min/{1.73_m2} (ref >59)
GFR calc non Af Amer: 67 mL/min/{1.73_m2} (ref >59)
GLUCOSE: 85 mg/dL (ref 65–99)
Globulin, Total: 3.2 g/dL (ref 1.5–4.5)
Potassium: 4.5 mmol/L (ref 3.5–5.2)
Sodium: 140 mmol/L (ref 134–144)
Total Protein: 7.5 g/dL (ref 6.0–8.5)

## 2017-02-22 LAB — TSH: TSH: 0.472 u[IU]/mL (ref 0.450–4.500)

## 2017-02-22 NOTE — Progress Notes (Signed)
BP 117/68   Pulse 69   Temp (!) 96.7 F (35.9 C) (Oral)   Ht 5' 6"  (1.676 m)   Wt 175 lb 3.2 oz (79.5 kg)   BMI 28.28 kg/m    Subjective:    Patient ID: Sonya Oliver, female    DOB: February 13, 1941, 75 y.o.   MRN: [id number]  HPI: Sonya Oliver is a 76 y.o. female presenting on 02/21/2017 for Follow-up (2 month )  This patient comes in for periodic recheck on medications and conditions including COPD, type 2 diabetes, hyperthyroidism history, osteoporosis. Reports overall stable with medications and breathing. No complaints..   All medications are reviewed today. There are no reports of any problems with the medications. All of the medical conditions are reviewed and updated.  Lab work is reviewed and will be ordered as medically necessary. There are no new problems reported with today's visit.   Relevant past medical, surgical, family and social history reviewed and updated as indicated. Allergies and medications reviewed and updated.  Past Medical History:  Diagnosis Date  . Arthritis   . Bladder tumor   . Cancer ([place])    bladder  . Cataract   . CHF (congestive heart failure) ([facility])    NO CARDIOLOGIST---  MONITORED BY PCP DR Jacelyn Grip  . COPD with emphysema ([place])    last Acute Exacerbation 08-21-2014  . Dyspnea on exertion   . H/O pulmonary edema    jan 2014--  acute CHF w/ pulmonary edema  . Hypertension   . Hyperthyroidism    currently no meds -- labs stable  . Nocturia   . Nocturnal oxygen desaturation    uses O2  HS via [place]--  and PRN daytime  . Osteoporosis   . Stroke ([place])   . Type 2 diabetes mellitus ([place])   . Ureteral tumor     Past Surgical History:  Procedure Laterality Date  . ABDOMINAL HYSTERECTOMY  1980'S   W/ BILATERAL SALPINGO-OOPHORECTOMY  . CATARACT EXTRACTION W/PHACO Left 01/31/2014   Procedure: CATARACT EXTRACTION PHACO AND INTRAOCULAR LENS PLACEMENT (IOC);  Surgeon: Tonny Branch, MD;  Location: [facility];  Service: Ophthalmology;  Laterality: Left;   CDE: 23.78  . CATARACT EXTRACTION W/PHACO Right 02/28/2014   Procedure: CATARACT EXTRACTION PHACO AND INTRAOCULAR LENS PLACEMENT (IOC);  Surgeon: Tonny Branch, MD;  Location: [facility];  Service: Ophthalmology;  Laterality: Right;  CDE:  11.34  . CYSTOSCOPY  03/23/2012   Procedure: CYSTOSCOPY;  Surgeon: Claybon Jabs, MD;  [facility];  Service: Urology;  Laterality: N/A;  . CYSTOSCOPY WITH RETROGRADE PYELOGRAM, URETEROSCOPY AND STENT PLACEMENT Right 10/21/2014   Procedure: CYSTOSCOPY WITH RETROGRADE PYELOGRAM, URETEROSCOPY, BLADDER TUMOR BIOPSY;  Surgeon: Claybon Jabs, MD;  Location: [place];  Service: Urology;  Laterality: Right;  . TRANSTHORACIC ECHOCARDIOGRAM  06-21-2013   moderate LVH,  grade I diastolic dysfunction, ef 51-88%,  mild MR & TR  . TRANSURETHRAL RESECTION OF BLADDER TUMOR  03/23/2012   Procedure: TRANSURETHRAL RESECTION OF BLADDER TUMOR (TURBT);  Surgeon: Claybon Jabs, MD;  [facility];  Service: Urology;  Laterality: N/A;  . TRANSURETHRAL RESECTION OF BLADDER TUMOR N/A 01/08/2013   Procedure: TRANSURETHRAL RESECTION OF BLADDER TUMOR (TURBT);  Surgeon: Claybon Jabs, MD;  Location: [facility];  Service: Urology;  Laterality: N/A;    Review of Systems  Constitutional: Negative.  Negative for activity change, fatigue and fever.  HENT: Negative.   Eyes: Negative.   Respiratory: Positive for shortness  of breath. Negative for cough and wheezing.   Cardiovascular: Negative.  Negative for chest pain, palpitations and leg swelling.  Gastrointestinal: Negative.  Negative for abdominal pain.  Endocrine: Negative.   Genitourinary: Negative.  Negative for dysuria.  Musculoskeletal: Negative.   Skin: Negative.   Neurological: Negative.     Allergies as of 02/21/2017   No Known Allergies     Medication List       Accurate as of 02/21/17 11:59 PM. Always use your most recent med list.          albuterol (2.5 MG/3ML) 0.083% nebulizer  solution Commonly known as:  PROVENTIL Inhale 3 mLs into the lungs 4 (four) times daily as needed for shortness of breath.   albuterol 108 (90 Base) MCG/ACT inhaler Commonly known as:  PROVENTIL HFA;VENTOLIN HFA Inhale 1-2 puffs into the lungs every 6 (six) hours as needed for wheezing or shortness of breath.   alendronate 70 MG tablet Commonly known as:  FOSAMAX Take 1 tablet (70 mg total) by mouth once a week. Take with a full glass of water on an empty stomach.   carvedilol 3.125 MG tablet Commonly known as:  COREG TAKE 1 TABLET BY MOUTH TWICE DAILY   fluticasone furoate-vilanterol 100-25 MCG/INH Aepb Commonly known as:  BREO ELLIPTA Inhale 1 puff into the lungs daily.   furosemide 40 MG tablet Commonly known as:  LASIX Take 1 tablet (40 mg total) by mouth 2 (two) times daily. TAKE 1 TABLET BY MOUTH ONCE DAILY   guaiFENesin 600 MG 12 hr tablet Commonly known as:  MUCINEX Take 1 tablet (600 mg total) by mouth 2 (two) times daily.   metFORMIN 500 MG tablet Commonly known as:  GLUCOPHAGE Take 1 tablet (500 mg total) by mouth daily.   potassium chloride SA 20 MEQ tablet Commonly known as:  K-DUR,KLOR-CON Take 20 mEq by mouth daily.   RESTASIS MULTIDOSE 0.05 % ophthalmic emulsion Generic drug:  cycloSPORINE Place 1 drop into both eyes 2 (two) times daily.   spironolactone 25 MG tablet Commonly known as:  ALDACTONE Take 1 tablet (25 mg total) by mouth daily.   Vitamin D3 2000 units Tabs Take 1 tablet by mouth daily.          Objective:    BP 117/68   Pulse 69   Temp (!) 96.7 F (35.9 C) (Oral)   Ht 5' 6"  (1.676 m)   Wt 175 lb 3.2 oz (79.5 kg)   BMI 28.28 kg/m   No Known Allergies  Physical Exam  Constitutional: She is oriented to person, place, and time. She appears well-developed and well-nourished.  HENT:  Head: Normocephalic and atraumatic.  Eyes: Conjunctivae and EOM are normal. Pupils are equal, round, and reactive to light.  Cardiovascular:  Normal rate, regular rhythm, normal heart sounds and intact distal pulses.   Pulmonary/Chest: Effort normal and breath sounds normal.  Abdominal: Soft. Bowel sounds are normal.  Neurological: She is alert and oriented to person, place, and time. She has normal reflexes.  Skin: Skin is warm and dry. No rash noted.  Psychiatric: She has a normal mood and affect. Her behavior is normal. Judgment and thought content normal.  Nursing note and vitals reviewed.   Results for orders placed or performed in visit on 02/21/17  Lipid panel  Result Value Ref Range   Cholesterol, Total 218 (H) 100 - 199 mg/dL   Triglycerides 45 0 - 149 mg/dL   HDL 105 >39 mg/dL   VLDL Cholesterol  Cal 9 5 - 40 mg/dL   LDL Calculated 104 (H) 0 - 99 mg/dL   Chol/HDL Ratio 2.1 0.0 - 4.4 ratio  CMP14+EGFR  Result Value Ref Range   Glucose 85 65 - 99 mg/dL   BUN 13 8 - 27 mg/dL   Creatinine, Ser 0.84 0.57 - 1.00 mg/dL   GFR calc non Af Amer 67 >59 mL/min/1.73   GFR calc Af Amer 78 >59 mL/min/1.73   BUN/Creatinine Ratio 15 12 - 28   Sodium 140 134 - 144 mmol/L   Potassium 4.5 3.5 - 5.2 mmol/L   Chloride 101 96 - 106 mmol/L   CO2 26 20 - 29 mmol/L   Calcium 9.7 8.7 - 10.3 mg/dL   Total Protein 7.5 6.0 - 8.5 g/dL   Albumin 4.3 3.5 - 4.8 g/dL   Globulin, Total 3.2 1.5 - 4.5 g/dL   Albumin/Globulin Ratio 1.3 1.2 - 2.2   Bilirubin Total 0.6 0.0 - 1.2 mg/dL   Alkaline Phosphatase 55 39 - 117 IU/L   AST 16 0 - 40 IU/L   ALT 9 0 - 32 IU/L  TSH  Result Value Ref Range   TSH 0.472 0.450 - 4.500 uIU/mL      Assessment & Plan:   1. Chronic obstructive pulmonary disease with acute exacerbation (HCC) - CMP14+EGFR  2. Type 2 diabetes mellitus with complication, without long-term current use of insulin (HCC) - Lipid panel - CMP14+EGFR  3. Hyperthyroidism - Lipid panel - CMP14+EGFR - TSH  4. Age-related osteoporosis without current pathological fracture   Current Outpatient Prescriptions:  .  albuterol  (PROVENTIL HFA;VENTOLIN HFA) 108 (90 Base) MCG/ACT inhaler, Inhale 1-2 puffs into the lungs every 6 (six) hours as needed for wheezing or shortness of breath., Disp: 1 Inhaler, Rfl: 11 .  albuterol (PROVENTIL) (2.5 MG/3ML) 0.083% nebulizer solution, Inhale 3 mLs into the lungs 4 (four) times daily as needed for shortness of breath., Disp: , Rfl:  .  alendronate (FOSAMAX) 70 MG tablet, Take 1 tablet (70 mg total) by mouth once a week. Take with a full glass of water on an empty stomach., Disp: 4 tablet, Rfl: 11 .  carvedilol (COREG) 3.125 MG tablet, TAKE 1 TABLET BY MOUTH TWICE DAILY, Disp: 180 tablet, Rfl: 1 .  Cholecalciferol (VITAMIN D3) 2000 UNITS TABS, Take 1 tablet by mouth daily., Disp: , Rfl:  .  fluticasone furoate-vilanterol (BREO ELLIPTA) 100-25 MCG/INH AEPB, Inhale 1 puff into the lungs daily., Disp: 1 each, Rfl: 2 .  furosemide (LASIX) 40 MG tablet, Take 1 tablet (40 mg total) by mouth 2 (two) times daily. TAKE 1 TABLET BY MOUTH ONCE DAILY, Disp: 60 tablet, Rfl: 0 .  guaiFENesin (MUCINEX) 600 MG 12 hr tablet, Take 1 tablet (600 mg total) by mouth 2 (two) times daily., Disp: 20 tablet, Rfl: 0 .  metFORMIN (GLUCOPHAGE) 500 MG tablet, Take 1 tablet (500 mg total) by mouth daily., Disp: 90 tablet, Rfl: 1 .  potassium chloride SA (K-DUR,KLOR-CON) 20 MEQ tablet, Take 20 mEq by mouth daily., Disp: , Rfl:  .  RESTASIS MULTIDOSE 0.05 % ophthalmic emulsion, Place 1 drop into both eyes 2 (two) times daily., Disp: , Rfl:  .  spironolactone (ALDACTONE) 25 MG tablet, Take 1 tablet (25 mg total) by mouth daily., Disp: 30 tablet, Rfl: 0  Continue all other maintenance medications as listed above.  Follow up plan: Return in about 3 months (around 05/24/2017) for recheck and labs.  Educational handout given for [employer]  Adah Salvage PA-C [facility] [address]  [place], [place] [address] [phone]   02/22/2017, 8:49 PM

## 2017-03-16 NOTE — Telephone Encounter (Signed)
na

## 2017-05-20 ENCOUNTER — Encounter: Payer: Self-pay | Admitting: *Deleted

## 2017-05-25 ENCOUNTER — Ambulatory Visit (INDEPENDENT_AMBULATORY_CARE_PROVIDER_SITE_OTHER): Payer: Medicare Other | Admitting: Physician Assistant

## 2017-05-25 ENCOUNTER — Encounter: Payer: Self-pay | Admitting: Physician Assistant

## 2017-05-25 VITALS — BP 130/75 | HR 88 | Temp 97.0°F | Ht 66.0 in | Wt 177.8 lb

## 2017-05-25 DIAGNOSIS — Z794 Long term (current) use of insulin: Secondary | ICD-10-CM

## 2017-05-25 DIAGNOSIS — E876 Hypokalemia: Secondary | ICD-10-CM

## 2017-05-25 DIAGNOSIS — J441 Chronic obstructive pulmonary disease with (acute) exacerbation: Secondary | ICD-10-CM | POA: Diagnosis not present

## 2017-05-25 DIAGNOSIS — Z23 Encounter for immunization: Secondary | ICD-10-CM | POA: Diagnosis not present

## 2017-05-25 DIAGNOSIS — E118 Type 2 diabetes mellitus with unspecified complications: Secondary | ICD-10-CM | POA: Diagnosis not present

## 2017-05-25 LAB — BAYER DCA HB A1C WAIVED: HB A1C (BAYER DCA - WAIVED): 5.9 % (ref <7.0)

## 2017-05-25 MED ORDER — ALBUTEROL SULFATE (2.5 MG/3ML) 0.083% IN NEBU: 3.0000 mL | INHALATION_SOLUTION | Freq: Four times a day (QID) | RESPIRATORY_TRACT | 11 refills | Status: DC | PRN

## 2017-05-25 MED ORDER — FUROSEMIDE 40 MG PO TABS: 40.0000 mg | ORAL_TABLET | Freq: Two times a day (BID) | ORAL | 11 refills | Status: DC

## 2017-05-25 MED ORDER — CARVEDILOL 3.125 MG PO TABS: 3.1250 mg | ORAL_TABLET | Freq: Two times a day (BID) | ORAL | 3 refills | Status: DC

## 2017-05-25 MED ORDER — SPIRONOLACTONE 25 MG PO TABS: 25.0000 mg | ORAL_TABLET | Freq: Every day | ORAL | 3 refills | Status: DC

## 2017-05-25 MED ORDER — METFORMIN HCL 500 MG PO TABS: 500.0000 mg | ORAL_TABLET | Freq: Every day | ORAL | 1 refills | Status: DC

## 2017-05-25 MED ORDER — POTASSIUM CHLORIDE CRYS ER 20 MEQ PO TBCR: 20.0000 meq | EXTENDED_RELEASE_TABLET | Freq: Every day | ORAL | 11 refills | Status: DC

## 2017-05-25 MED ORDER — FLUTICASONE FUROATE-VILANTEROL 100-25 MCG/INH IN AEPB
1.0000 | INHALATION_SPRAY | Freq: Every day | RESPIRATORY_TRACT | 11 refills | Status: DC
Start: 2017-05-25 — End: 2017-11-23

## 2017-05-25 NOTE — Progress Notes (Signed)
BP 130/75   Pulse 88   Temp (!) 97 F (36.1 C) (Oral)   Ht _0  (1.676 m)   Wt 177 lb 12.8 oz (80.6 kg)   BMI 28.70 kg/m    Subjective:    Patient ID: Sonya Oliver, female    DOB: 1941-03-10, 76 y.o.   MRN: [id number]  HPI: Sonya Oliver is a 76 y.o. female presenting on 05/25/2017 for Follow-up (3 month)  This patient comes in for periodic recheck on medications and conditions including Type 2 diabetes controlled, COPD, CHF history with hyperkalemia. Overall she has been feeling well. She has her breathing under control as is good as it has ever been. She does see Dr. Luan Pulling. She will see him again in January. We have reviewed all of her medications and there are some refills needed. We will also plan to do blood work at her next visit..   All medications are reviewed today. There are no reports of any problems with the medications. All of the medical conditions are reviewed and updated.  Lab work is reviewed and will be ordered as medically necessary. There are no new problems reported with today's visit.   Relevant past medical, surgical, family and social history reviewed and updated as indicated. Allergies and medications reviewed and updated.  Past Medical History:  Diagnosis Date  . Arthritis   . Bladder tumor   . Cancer ([place])    bladder  . Cataract   . CHF (congestive heart failure) ([place])    NO CARDIOLOGIST---  MONITORED BY PCP DR Jacelyn Grip  . COPD with emphysema ([place])    last Acute Exacerbation 08-21-2014  . Dyspnea on exertion   . H/O pulmonary edema    jan 2014--  acute CHF w/ pulmonary edema  . Hypertension   . Hyperthyroidism    currently no meds -- labs stable  . Nocturia   . Nocturnal oxygen desaturation    uses O2  HS via --  and PRN daytime  . Osteoporosis   . Stroke ([place])   . Type 2 diabetes mellitus ([place])   . Ureteral tumor     Past Surgical History:  Procedure Laterality Date  . ABDOMINAL HYSTERECTOMY  1980'S   W/ BILATERAL  SALPINGO-OOPHORECTOMY  . CATARACT EXTRACTION W/PHACO Left 01/31/2014   Procedure: CATARACT EXTRACTION PHACO AND INTRAOCULAR LENS PLACEMENT (IOC);  Surgeon: Tonny Branch, MD;  Location: [facility];  Service: Ophthalmology;  Laterality: Left;  CDE: 23.78  . CATARACT EXTRACTION W/PHACO Right 02/28/2014   Procedure: CATARACT EXTRACTION PHACO AND INTRAOCULAR LENS PLACEMENT (IOC);  Surgeon: Tonny Branch, MD;  Location: [facility];  Service: Ophthalmology;  Laterality: Right;  CDE:  11.34  . CYSTOSCOPY  03/23/2012   Procedure: CYSTOSCOPY;  Surgeon: Claybon Jabs, MD;  [facility];  Service: Urology;  Laterality: N/A;  . CYSTOSCOPY WITH RETROGRADE PYELOGRAM, URETEROSCOPY AND STENT PLACEMENT Right 10/21/2014   Procedure: CYSTOSCOPY WITH RETROGRADE PYELOGRAM, URETEROSCOPY, BLADDER TUMOR BIOPSY;  Surgeon: Claybon Jabs, MD;  Location: [place] [facility];  Service: Urology;  Laterality: Right;  . TRANSTHORACIC ECHOCARDIOGRAM  06-21-2013   moderate LVH,  grade I diastolic dysfunction, ef 81-10%,  mild MR & TR  . TRANSURETHRAL RESECTION OF BLADDER TUMOR  03/23/2012   Procedure: TRANSURETHRAL RESECTION OF BLADDER TUMOR (TURBT);  Surgeon: Claybon Jabs, MD;  [facility];  Service: Urology;  Laterality: N/A;  . TRANSURETHRAL RESECTION OF BLADDER TUMOR N/A 01/08/2013   Procedure: TRANSURETHRAL RESECTION OF BLADDER TUMOR (  TURBT);  Surgeon: Claybon Jabs, MD;  Location[facility];  Service: Urology;  Laterality: N/A;    Review of Systems  Constitutional: Negative.  Negative for activity change, fatigue and fever.  HENT: Negative.   Eyes: Negative.   Respiratory: Negative.  Negative for cough.   Cardiovascular: Negative.  Negative for chest pain.  Gastrointestinal: Negative.  Negative for abdominal pain.  Endocrine: Negative.   Genitourinary: Negative.  Negative for dysuria.  Musculoskeletal: Positive for gait problem.  Skin: Negative.     Allergies as of 05/25/2017   No Known Allergies      Medication List       Accurate as of 05/25/17  1:28 PM. Always use your most recent med list.          albuterol 108 (90 Base) MCG/ACT inhaler Commonly known as:  PROVENTIL HFA;VENTOLIN HFA Inhale 1-2 puffs into the lungs every 6 (six) hours as needed for wheezing or shortness of breath.   albuterol (2.5 MG/3ML) 0.083% nebulizer solution Commonly known as:  PROVENTIL Inhale 3 mLs into the lungs 4 (four) times daily as needed for shortness of breath.   alendronate 70 MG tablet Commonly known as:  FOSAMAX Take 1 tablet (70 mg total) by mouth once a week. Take with a full glass of water on an empty stomach.   carvedilol 3.125 MG tablet Commonly known as:  COREG Take 1 tablet (3.125 mg total) by mouth 2 (two) times daily.   fluticasone furoate-vilanterol 100-25 MCG/INH Aepb Commonly known as:  BREO ELLIPTA Inhale 1 puff into the lungs daily.   furosemide 40 MG tablet Commonly known as:  LASIX Take 1 tablet (40 mg total) by mouth 2 (two) times daily. TAKE 1 TABLET BY MOUTH ONCE DAILY   metFORMIN 500 MG tablet Commonly known as:  GLUCOPHAGE Take 1 tablet (500 mg total) by mouth daily.   potassium chloride SA 20 MEQ tablet Commonly known as:  K-DUR,KLOR-CON Take 1 tablet (20 mEq total) by mouth daily.   RESTASIS MULTIDOSE 0.05 % ophthalmic emulsion Generic drug:  cycloSPORINE Place 1 drop into both eyes 2 (two) times daily.   spironolactone 25 MG tablet Commonly known as:  ALDACTONE Take 1 tablet (25 mg total) by mouth daily.   Vitamin D3 2000 units Tabs Take 1 tablet by mouth daily.            Discharge Care Instructions        Start     Ordered   05/25/17 0000  carvedilol (COREG) 3.125 MG tablet  2 times daily    Question:  Supervising Provider  Answer:  Timmothy Euler   05/25/17 1202   05/25/17 0000  furosemide (LASIX) 40 MG tablet  2 times daily    Question:  Supervising Provider  Answer:  Timmothy Euler   05/25/17 1202   05/25/17 0000   potassium chloride SA (K-DUR,KLOR-CON) 20 MEQ tablet  Daily    Question:  Supervising Provider  Answer:  Timmothy Euler   05/25/17 1202   05/25/17 0000  metFORMIN (GLUCOPHAGE) 500 MG tablet  Daily    Question:  Supervising Provider  Answer:  Timmothy Euler   05/25/17 1202   05/25/17 0000  fluticasone furoate-vilanterol (BREO ELLIPTA) 100-25 MCG/INH AEPB  Daily    Question:  Supervising Provider  Answer:  Timmothy Euler   05/25/17 1202   05/25/17 0000  albuterol (PROVENTIL) (2.5 MG/3ML) 0.083% nebulizer solution  4 times daily PRN  Question:  Supervising Provider  Answer:  Timmothy Euler   05/25/17 1202   05/25/17 0000  Bayer DCA Hb A1c Waived     05/25/17 1207   05/25/17 0000  spironolactone (ALDACTONE) 25 MG tablet  Daily    Question:  Supervising Provider  Answer:  Kenn File L   05/25/17 1210   05/25/17 0000  Flu vaccine HIGH DOSE PF     05/25/17 1215         Objective:    BP 130/75   Pulse 88   Temp (!) 97 F (36.1 C) (Oral)   Ht _0  (1.676 m)   Wt 177 lb 12.8 oz (80.6 kg)   BMI 28.70 kg/m   No Known Allergies  Physical Exam  Constitutional: She is oriented to person, place, and time. She appears well-developed and well-nourished.  HENT:  Head: Normocephalic and atraumatic.  Right Ear: Tympanic membrane, external ear and ear canal normal.  Left Ear: Tympanic membrane, external ear and ear canal normal.  Nose: Nose normal. No rhinorrhea.  Mouth/Throat: Oropharynx is clear and moist and mucous membranes are normal. No oropharyngeal exudate or posterior oropharyngeal erythema.  Eyes: Pupils are equal, round, and reactive to light. Conjunctivae and EOM are normal.  Neck: Normal range of motion. Neck supple.  Cardiovascular: Normal rate, regular rhythm, normal heart sounds and intact distal pulses.   Pulmonary/Chest: Effort normal and breath sounds normal.  Abdominal: Soft. Bowel sounds are normal.  Neurological: She is alert and oriented to  person, place, and time. She has normal reflexes.  Skin: Skin is warm and dry. No rash noted.  Psychiatric: She has a normal mood and affect. Her behavior is normal. Judgment and thought content normal.  Nursing note and vitals reviewed.   Results for orders placed or performed in visit on 02/21/17  Lipid panel  Result Value Ref Range   Cholesterol, Total 218 (H) 100 - 199 mg/dL   Triglycerides 45 0 - 149 mg/dL   HDL 105 >39 mg/dL   VLDL Cholesterol Cal 9 5 - 40 mg/dL   LDL Calculated 104 (H) 0 - 99 mg/dL   Chol/HDL Ratio 2.1 0.0 - 4.4 ratio  CMP14+EGFR  Result Value Ref Range   Glucose 85 65 - 99 mg/dL   BUN 13 8 - 27 mg/dL   Creatinine, Ser 0.84 0.57 - 1.00 mg/dL   GFR calc non Af Amer 67 >59 mL/min/1.73   GFR calc Af Amer 78 >59 mL/min/1.73   BUN/Creatinine Ratio 15 12 - 28   Sodium 140 134 - 144 mmol/L   Potassium 4.5 3.5 - 5.2 mmol/L   Chloride 101 96 - 106 mmol/L   CO2 26 20 - 29 mmol/L   Calcium 9.7 8.7 - 10.3 mg/dL   Total Protein 7.5 6.0 - 8.5 g/dL   Albumin 4.3 3.5 - 4.8 g/dL   Globulin, Total 3.2 1.5 - 4.5 g/dL   Albumin/Globulin Ratio 1.3 1.2 - 2.2   Bilirubin Total 0.6 0.0 - 1.2 mg/dL   Alkaline Phosphatase 55 39 - 117 IU/L   AST 16 0 - 40 IU/L   ALT 9 0 - 32 IU/L  TSH  Result Value Ref Range   TSH 0.472 0.450 - 4.500 uIU/mL      Assessment & Plan:   1. Hypokalemia - potassium chloride SA (K-DUR,KLOR-CON) 20 MEQ tablet; Take 1 tablet (20 mEq total) by mouth daily.  Dispense: 30 tablet; Refill: 11  2. Type 2 diabetes mellitus with  complication, with long-term current use of insulin (HCC) - Bayer DCA Hb A1c Waived  3. Chronic obstructive pulmonary disease with acute exacerbation (HCC) - fluticasone furoate-vilanterol (BREO ELLIPTA) 100-25 MCG/INH AEPB; Inhale 1 puff into the lungs daily.  Dispense: 1 each; Refill: 11 - albuterol (PROVENTIL) (2.5 MG/3ML) 0.083% nebulizer solution; Inhale 3 mLs into the lungs 4 (four) times daily as needed for shortness of  breath.  Dispense: 75 mL; Refill: 11  4. Need for immunization against influenza - Flu vaccine HIGH DOSE PF  5. Encounter for immunization - carvedilol (COREG) 3.125 MG tablet; Take 1 tablet (3.125 mg total) by mouth 2 (two) times daily.  Dispense: 180 tablet; Refill: 3 - furosemide (LASIX) 40 MG tablet; Take 1 tablet (40 mg total) by mouth 2 (two) times daily. TAKE 1 TABLET BY MOUTH ONCE DAILY  Dispense: 60 tablet; Refill: 11 - potassium chloride SA (K-DUR,KLOR-CON) 20 MEQ tablet; Take 1 tablet (20 mEq total) by mouth daily.  Dispense: 30 tablet; Refill: 11 - metFORMIN (GLUCOPHAGE) 500 MG tablet; Take 1 tablet (500 mg total) by mouth daily.  Dispense: 90 tablet; Refill: 1 - fluticasone furoate-vilanterol (BREO ELLIPTA) 100-25 MCG/INH AEPB; Inhale 1 puff into the lungs daily.  Dispense: 1 each; Refill: 11 - albuterol (PROVENTIL) (2.5 MG/3ML) 0.083% nebulizer solution; Inhale 3 mLs into the lungs 4 (four) times daily as needed for shortness of breath.  Dispense: 75 mL; Refill: 11 - Bayer DCA Hb A1c Waived - spironolactone (ALDACTONE) 25 MG tablet; Take 1 tablet (25 mg total) by mouth daily.  Dispense: 90 tablet; Refill: 3 - Flu vaccine HIGH DOSE PF    Current Outpatient Prescriptions:  .  albuterol (PROVENTIL HFA;VENTOLIN HFA) 108 (90 Base) MCG/ACT inhaler, Inhale 1-2 puffs into the lungs every 6 (six) hours as needed for wheezing or shortness of breath., Disp: 1 Inhaler, Rfl: 11 .  albuterol (PROVENTIL) (2.5 MG/3ML) 0.083% nebulizer solution, Inhale 3 mLs into the lungs 4 (four) times daily as needed for shortness of breath., Disp: 75 mL, Rfl: 11 .  alendronate (FOSAMAX) 70 MG tablet, Take 1 tablet (70 mg total) by mouth once a week. Take with a full glass of water on an empty stomach., Disp: 4 tablet, Rfl: 11 .  carvedilol (COREG) 3.125 MG tablet, Take 1 tablet (3.125 mg total) by mouth 2 (two) times daily., Disp: 180 tablet, Rfl: 3 .  Cholecalciferol (VITAMIN D3) 2000 UNITS TABS, Take 1  tablet by mouth daily., Disp: , Rfl:  .  fluticasone furoate-vilanterol (BREO ELLIPTA) 100-25 MCG/INH AEPB, Inhale 1 puff into the lungs daily., Disp: 1 each, Rfl: 11 .  furosemide (LASIX) 40 MG tablet, Take 1 tablet (40 mg total) by mouth 2 (two) times daily. TAKE 1 TABLET BY MOUTH ONCE DAILY, Disp: 60 tablet, Rfl: 11 .  metFORMIN (GLUCOPHAGE) 500 MG tablet, Take 1 tablet (500 mg total) by mouth daily., Disp: 90 tablet, Rfl: 1 .  potassium chloride SA (K-DUR,KLOR-CON) 20 MEQ tablet, Take 1 tablet (20 mEq total) by mouth daily., Disp: 30 tablet, Rfl: 11 .  RESTASIS MULTIDOSE 0.05 % ophthalmic emulsion, Place 1 drop into both eyes 2 (two) times daily., Disp: , Rfl:  .  spironolactone (ALDACTONE) 25 MG tablet, Take 1 tablet (25 mg total) by mouth daily., Disp: 90 tablet, Rfl: 3 Continue all other maintenance medications as listed above.  Follow up plan: Return in about 6 months (around 11/22/2017) for recheck.  Educational handout given for [place] PA-C [place]  [address]  [place], [place] [address] [phone]   05/25/2017, 1:28 PM

## 2017-05-25 NOTE — Patient Instructions (Signed)
Metamucil or citrucel  miralax 1/2 cap up to 2 caps daily

## 2017-07-13 ENCOUNTER — Ambulatory Visit: Payer: Self-pay | Admitting: *Deleted

## 2017-07-18 ENCOUNTER — Encounter: Payer: Self-pay | Admitting: Physician Assistant

## 2017-08-25 ENCOUNTER — Encounter: Payer: Self-pay | Admitting: *Deleted

## 2017-09-01 ENCOUNTER — Other Ambulatory Visit (HOSPITAL_COMMUNITY): Payer: Self-pay | Admitting: Respiratory Therapy

## 2017-09-01 DIAGNOSIS — J441 Chronic obstructive pulmonary disease with (acute) exacerbation: Secondary | ICD-10-CM

## 2017-09-15 ENCOUNTER — Ambulatory Visit (HOSPITAL_COMMUNITY)
Admission: RE | Admit: 2017-09-15 | Discharge: 2017-09-15 | Disposition: A | Discharge status: Home / Self Care | Payer: Medicare Other | Source: Ambulatory Visit | Attending: Pulmonary Disease | Admitting: Pulmonary Disease

## 2017-09-15 DIAGNOSIS — J441 Chronic obstructive pulmonary disease with (acute) exacerbation: Secondary | ICD-10-CM

## 2017-09-15 LAB — PULMONARY FUNCTION TEST
FEF 25-75 POST: 0.16 L/s
FEF 25-75 PRE: 0.15 L/s
FEF2575-%Change-Post: 4 %
FEF2575-%PRED-POST: 11 %
FEF2575-%Pred-Pre: 11 %
FEV1-%Change-Post: 4 %
FEV1-%PRED-PRE: 25 %
FEV1-%Pred-Post: 26 %
FEV1-POST: 0.42 L
FEV1-Pre: 0.41 L
FEV1FVC-%Change-Post: 2 %
FEV1FVC-%PRED-PRE: 48 %
FEV6-%CHANGE-POST: 4 %
FEV6-%PRED-POST: 50 %
FEV6-%Pred-Pre: 48 %
FEV6-POST: 0.99 L
FEV6-Pre: 0.95 L
FEV6FVC-%CHANGE-POST: 1 %
FEV6FVC-%PRED-POST: 91 %
FEV6FVC-%Pred-Pre: 90 %
FVC-%CHANGE-POST: 2 %
FVC-%PRED-POST: 55 %
FVC-%Pred-Pre: 53 %
FVC-Post: 1.12 L
FVC-Pre: 1.09 L
POST FEV1/FVC RATIO: 38 %
PRE FEV1/FVC RATIO: 37 %
Post FEV6/FVC ratio: 88 %
Pre FEV6/FVC Ratio: 86 %
RV % pred: 316 %
RV: 7.38 L
TLC % PRED: 167 %
TLC: 8.36 L

## 2017-09-15 MED ORDER — ALBUTEROL SULFATE (2.5 MG/3ML) 0.083% IN NEBU
2.5000 mg | INHALATION_SOLUTION | Freq: Once | RESPIRATORY_TRACT | Status: AC
  Administered 2017-09-15: 2.5 mg via RESPIRATORY_TRACT

## 2017-10-24 IMAGING — DX DG CHEST 2V
2 series · 2 of 2 positions shown · non-contrast
Comparison: Chest x-ray of 01/09/2016

CLINICAL DATA: COPD with exacerbation

EXAM:
CHEST  2 VIEW

[chest pa]
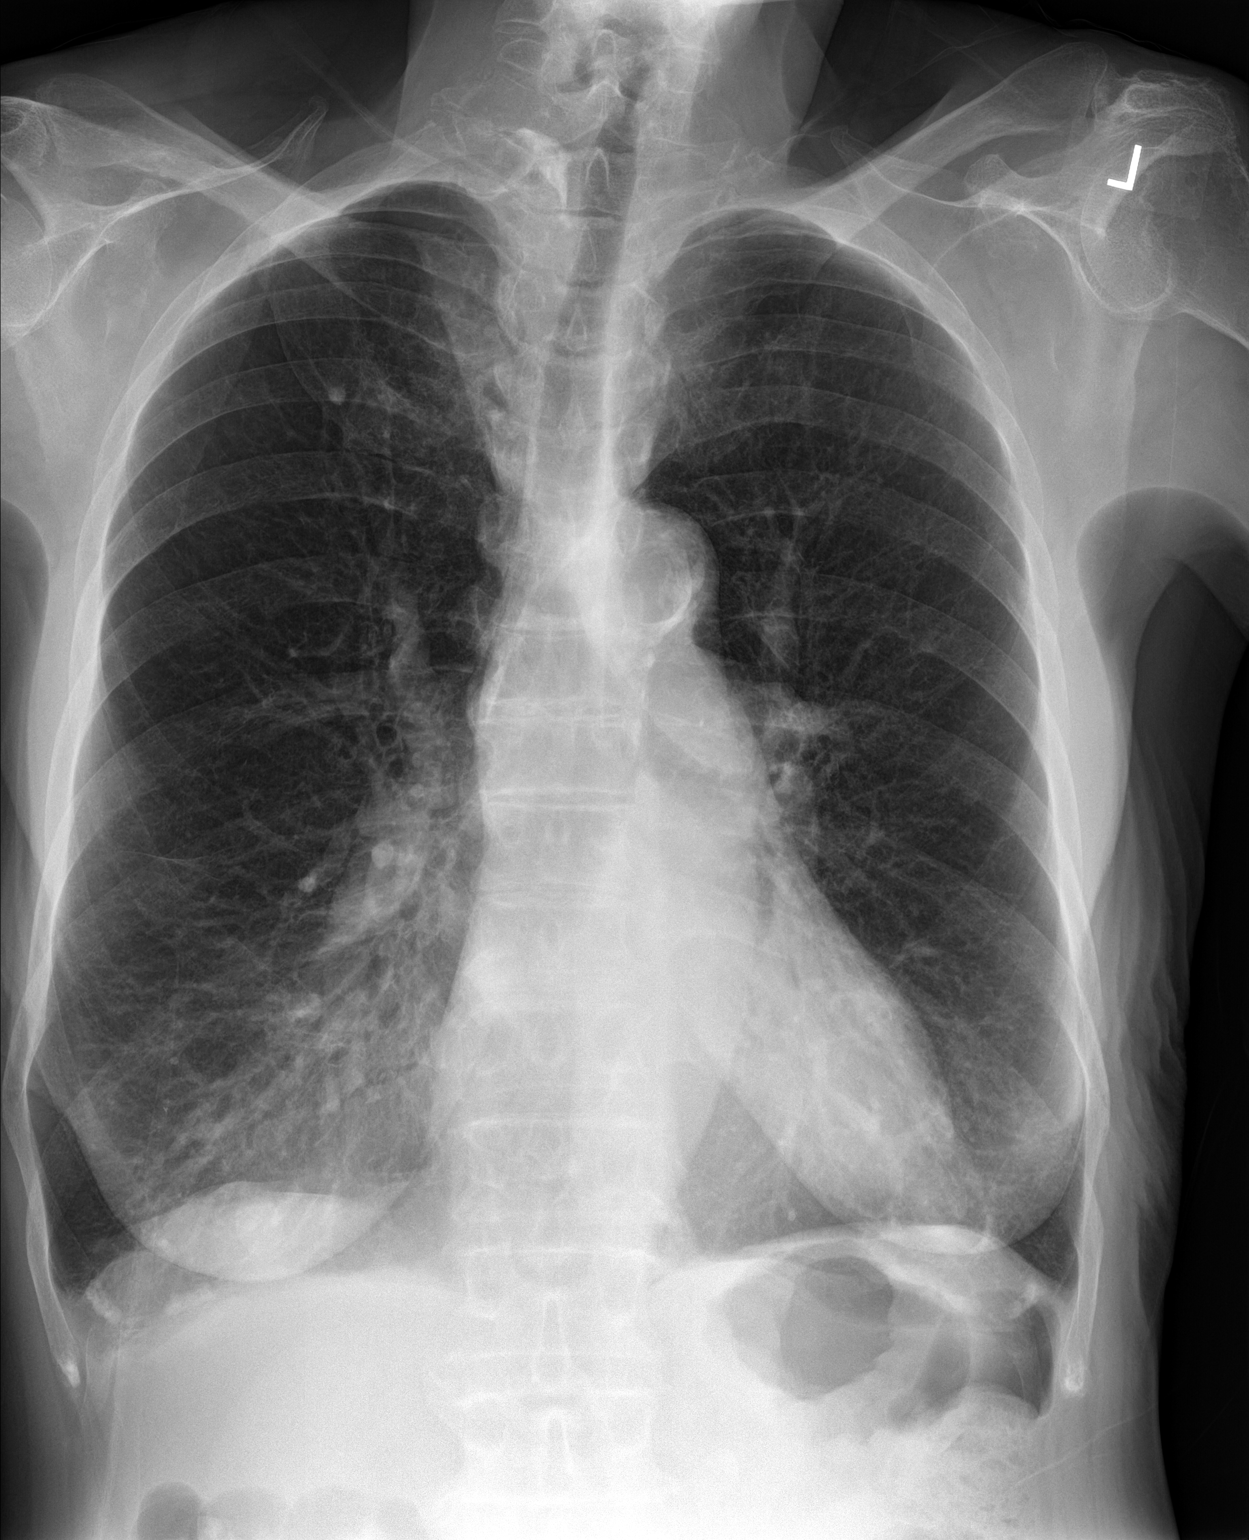

[chest lat]
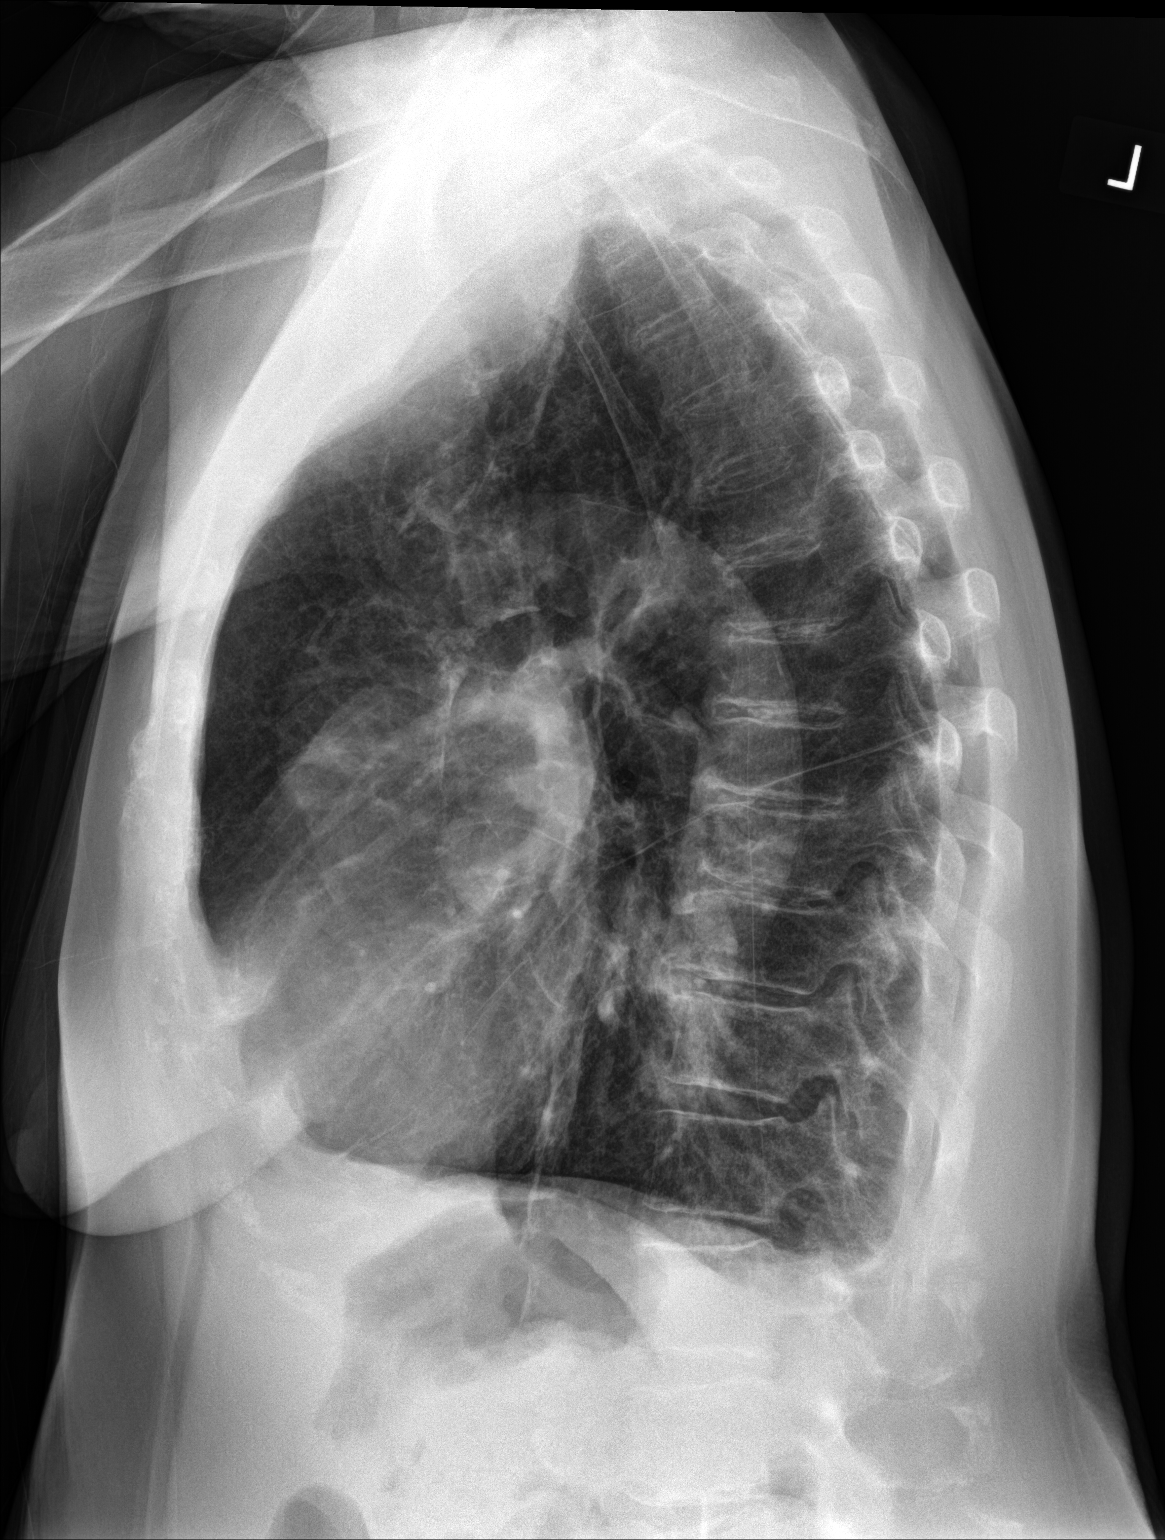

[2 of 2 positions shown; findings below may reference images not displayed]

FINDINGS: The lungs are markedly hyperaerated with flattened hemidiaphragms
consistent with emphysema. No pneumonia or effusion is seen.
Mediastinal and hilar contours are stable and the heart is
borderline enlarged. No acute bony abnormality is seen. There is
thoracic aortic atherosclerosis present. The bones are osteopenic
and there are degenerative changes throughout the thoracic spine.
IMPRESSION: 1. Marked hyperaeration consistent with emphysema. No active lung
disease.
2. Moderate thoracic aortic atherosclerosis.

## 2017-11-22 ENCOUNTER — Ambulatory Visit: Payer: Medicare Other | Admitting: Physician Assistant

## 2017-11-23 ENCOUNTER — Encounter: Payer: Self-pay | Admitting: Physician Assistant

## 2017-11-23 ENCOUNTER — Ambulatory Visit (INDEPENDENT_AMBULATORY_CARE_PROVIDER_SITE_OTHER): Payer: Medicare Other | Admitting: Physician Assistant

## 2017-11-23 VITALS — BP 142/64 | HR 74 | Temp 96.6°F | Ht 66.0 in | Wt 180.6 lb

## 2017-11-23 DIAGNOSIS — J449 Chronic obstructive pulmonary disease, unspecified: Secondary | ICD-10-CM | POA: Diagnosis not present

## 2017-11-23 DIAGNOSIS — J441 Chronic obstructive pulmonary disease with (acute) exacerbation: Secondary | ICD-10-CM

## 2017-11-23 DIAGNOSIS — M810 Age-related osteoporosis without current pathological fracture: Secondary | ICD-10-CM

## 2017-11-23 DIAGNOSIS — E119 Type 2 diabetes mellitus without complications: Secondary | ICD-10-CM | POA: Diagnosis not present

## 2017-11-23 DIAGNOSIS — E118 Type 2 diabetes mellitus with unspecified complications: Secondary | ICD-10-CM | POA: Diagnosis not present

## 2017-11-23 DIAGNOSIS — I509 Heart failure, unspecified: Secondary | ICD-10-CM

## 2017-11-23 LAB — BAYER DCA HB A1C WAIVED: HB A1C: 5.8 % (ref <7.0)

## 2017-11-23 MED ORDER — METFORMIN HCL 500 MG PO TABS: 500.0000 mg | ORAL_TABLET | Freq: Every day | ORAL | 1 refills | Status: DC

## 2017-11-23 MED ORDER — ALENDRONATE SODIUM 70 MG PO TABS: 70.0000 mg | ORAL_TABLET | ORAL | 11 refills | Status: DC

## 2017-11-23 MED ORDER — ALBUTEROL SULFATE HFA 108 (90 BASE) MCG/ACT IN AERS: 1.0000 | INHALATION_SPRAY | Freq: Four times a day (QID) | RESPIRATORY_TRACT | 11 refills | Status: DC | PRN

## 2017-11-23 MED ORDER — ALBUTEROL SULFATE (2.5 MG/3ML) 0.083% IN NEBU: 3.0000 mL | INHALATION_SOLUTION | Freq: Four times a day (QID) | RESPIRATORY_TRACT | 11 refills | Status: DC | PRN

## 2017-11-23 MED ORDER — FLUTICASONE-SALMETEROL 100-50 MCG/DOSE IN AEPB
1.0000 | INHALATION_SPRAY | Freq: Two times a day (BID) | RESPIRATORY_TRACT | 11 refills | Status: DC
Start: 2017-11-23 — End: 2018-05-24

## 2017-11-23 NOTE — Progress Notes (Signed)
BP (!) 142/64   Pulse 74   Temp (!) 96.6 F (35.9 C) (Oral)   Ht 5' 6"  (1.676 m)   Wt 180 lb 9.6 oz (81.9 kg)   SpO2 91%   BMI 29.15 kg/m    Subjective:    Patient ID: Sonya Oliver, female    DOB: 10/10/1940, 77 y.o.   MRN: [id number]  HPI: Sonya Oliver is a 77 y.o. female presenting on 11/23/2017 for Follow-up (6 month )  This patient comes in for periodic recheck on medications and conditions including COPD, osteoporosis, diabetes, CHF. She is feeling very good these days.  There is no new issue at this time.  She does need some refills.   All medications are reviewed today. There are no reports of any problems with the medications. All of the medical conditions are reviewed and updated.  Lab work is reviewed and will be ordered as medically necessary. There are no new problems reported with today's visit.   Past Medical History:  Diagnosis Date  . Arthritis   . Bladder tumor   . Cancer ([place])    bladder  . Cataract   . CHF (congestive heart failure) ([place])    NO CARDIOLOGIST---  MONITORED BY PCP DR Jacelyn Grip  . COPD with emphysema ([place])    last Acute Exacerbation 08-21-2014  . Dyspnea on exertion   . H/O pulmonary edema    jan 2014--  acute CHF w/ pulmonary edema  . Hypertension   . Hyperthyroidism    currently no meds -- labs stable  . Nocturia   . Nocturnal oxygen desaturation    uses O2  HS via [place]--  and PRN daytime  . Osteoporosis   . Stroke ([place])   . Type 2 diabetes mellitus ([place])   . Ureteral tumor    Relevant past medical, surgical, family and social history reviewed and updated as indicated. Interim medical history since our last visit reviewed. Allergies and medications reviewed and updated. DATA REVIEWED: CHART IN EPIC  Family History reviewed for pertinent findings.  Review of Systems  Constitutional: Negative.   HENT: Negative.   Eyes: Negative.   Respiratory: Negative.   Gastrointestinal: Negative.   Genitourinary: Negative.      Allergies as of 11/23/2017   No Known Allergies     Medication List        Accurate as of 11/23/17  4:06 PM. Always use your most recent med list.          albuterol 108 (90 Base) MCG/ACT inhaler Commonly known as:  PROVENTIL HFA;VENTOLIN HFA Inhale 1-2 puffs into the lungs every 6 (six) hours as needed for wheezing or shortness of breath.   albuterol (2.5 MG/3ML) 0.083% nebulizer solution Commonly known as:  PROVENTIL Inhale 3 mLs into the lungs 4 (four) times daily as needed for shortness of breath.   alendronate 70 MG tablet Commonly known as:  FOSAMAX Take 1 tablet (70 mg total) by mouth once a week. Take with a full glass of water on an empty stomach.   carvedilol 3.125 MG tablet Commonly known as:  COREG Take 1 tablet (3.125 mg total) by mouth 2 (two) times daily.   Fluticasone-Salmeterol 100-50 MCG/DOSE Aepb Commonly known as:  ADVAIR DISKUS Inhale 1 puff into the lungs 2 (two) times daily.   furosemide 40 MG tablet Commonly known as:  LASIX Take 1 tablet (40 mg total) by mouth 2 (two) times daily. TAKE 1 TABLET BY MOUTH ONCE DAILY  metFORMIN 500 MG tablet Commonly known as:  GLUCOPHAGE Take 1 tablet (500 mg total) by mouth daily.   potassium chloride SA 20 MEQ tablet Commonly known as:  K-DUR,KLOR-CON Take 1 tablet (20 mEq total) by mouth daily.   RESTASIS MULTIDOSE 0.05 % ophthalmic emulsion Generic drug:  cycloSPORINE Place 1 drop into both eyes 2 (two) times daily.   spironolactone 25 MG tablet Commonly known as:  ALDACTONE Take 1 tablet (25 mg total) by mouth daily.   Vitamin D3 2000 units Tabs Take 1 tablet by mouth daily.          Objective:    BP (!) 142/64   Pulse 74   Temp (!) 96.6 F (35.9 C) (Oral)   Ht 5' 6"  (1.676 m)   Wt 180 lb 9.6 oz (81.9 kg)   SpO2 91%   BMI 29.15 kg/m   No Known Allergies  Wt Readings from Last 3 Encounters:  11/23/17 180 lb 9.6 oz (81.9 kg)  05/25/17 177 lb 12.8 oz (80.6 kg)  02/21/17 175 lb  3.2 oz (79.5 kg)    Physical Exam  Constitutional: She is oriented to person, place, and time. She appears well-developed and well-nourished.  HENT:  Head: Normocephalic and atraumatic.  Eyes: Pupils are equal, round, and reactive to light. Conjunctivae and EOM are normal.  Cardiovascular: Normal rate, regular rhythm, normal heart sounds and intact distal pulses.  Pulmonary/Chest: Effort normal and breath sounds normal.  Abdominal: Soft. Bowel sounds are normal.  Neurological: She is alert and oriented to person, place, and time. She has normal reflexes.  Skin: Skin is warm and dry. No rash noted.  Psychiatric: She has a normal mood and affect. Her behavior is normal. Judgment and thought content normal.  Nursing note and vitals reviewed.   Results for orders placed or performed in visit on 11/23/17  Bayer DCA Hb A1c Waived  Result Value Ref Range   Bayer DCA Hb A1c Waived 5.8 <7.0 %      Assessment & Plan:   1. Chronic obstructive pulmonary disease with acute exacerbation (HCC) - albuterol (PROVENTIL HFA;VENTOLIN HFA) 108 (90 Base) MCG/ACT inhaler; Inhale 1-2 puffs into the lungs every 6 (six) hours as needed for wheezing or shortness of breath.  Dispense: 1 Inhaler; Refill: 11 - albuterol (PROVENTIL) (2.5 MG/3ML) 0.083% nebulizer solution; Inhale 3 mLs into the lungs 4 (four) times daily as needed for shortness of breath.  Dispense: 75 mL; Refill: 11 - Fluticasone-Salmeterol (ADVAIR DISKUS) 100-50 MCG/DOSE AEPB; Inhale 1 puff into the lungs 2 (two) times daily.  Dispense: 60 each; Refill: 11  2. Chronic obstructive pulmonary disease, unspecified COPD type (HCC) - albuterol (PROVENTIL) (2.5 MG/3ML) 0.083% nebulizer solution; Inhale 3 mLs into the lungs 4 (four) times daily as needed for shortness of breath.  Dispense: 75 mL; Refill: 11  3. Age related osteoporosis, unspecified pathological fracture presence - alendronate (FOSAMAX) 70 MG tablet; Take 1 tablet (70 mg total) by mouth  once a week. Take with a full glass of water on an empty stomach.  Dispense: 4 tablet; Refill: 11  4. Type 2 diabetes mellitus with complication, without long-term current use of insulin ([place])  5. Diabetes mellitus without complication (HCC) - metFORMIN (GLUCOPHAGE) 500 MG tablet; Take 1 tablet (500 mg total) by mouth daily.  Dispense: 90 tablet; Refill: 1 - CBC with Differential/Platelet - CMP14+EGFR - Lipid panel - Bayer DCA Hb A1c Waived  6. Congestive heart failure, unspecified HF chronicity, unspecified heart failure type ([place]) -  CBC with Differential/Platelet - CMP14+EGFR - Lipid panel - Bayer DCA Hb A1c Waived   Continue all other maintenance medications as listed above.  Follow up plan: Return in about 6 months (around 05/26/2018) for recheck.  Educational handout given for McEwen PA-C [facility] [address]  [place], [place] [address] [phone]   11/23/2017, 4:06 PM

## 2017-11-24 LAB — CMP14+EGFR
ALBUMIN: 4.2 g/dL (ref 3.5–4.8)
ALK PHOS: 62 IU/L (ref 39–117)
ALT: 12 IU/L (ref 0–32)
AST: 19 IU/L (ref 0–40)
Albumin/Globulin Ratio: 1.4 (ref 1.2–2.2)
BILIRUBIN TOTAL: 0.6 mg/dL (ref 0.0–1.2)
BUN/Creatinine Ratio: 21 (ref 12–28)
BUN: 17 mg/dL (ref 8–27)
CHLORIDE: 104 mmol/L (ref 96–106)
CO2: 25 mmol/L (ref 20–29)
Calcium: 9.4 mg/dL (ref 8.7–10.3)
Creatinine, Ser: 0.82 mg/dL (ref 0.57–1.00)
GFR calc Af Amer: 79 mL/min/{1.73_m2} (ref >59)
GFR calc non Af Amer: 69 mL/min/{1.73_m2} (ref >59)
GLUCOSE: 71 mg/dL (ref 65–99)
Globulin, Total: 3 g/dL (ref 1.5–4.5)
Potassium: 5 mmol/L (ref 3.5–5.2)
Sodium: 143 mmol/L (ref 134–144)
Total Protein: 7.2 g/dL (ref 6.0–8.5)

## 2017-11-24 LAB — CBC WITH DIFFERENTIAL/PLATELET
Basophils Absolute: 0 10*3/uL (ref 0.0–0.2)
Basos: 0 %
EOS (ABSOLUTE): 0.2 10*3/uL (ref 0.0–0.4)
Eos: 5 %
Hematocrit: 36.7 % (ref 34.0–46.6)
Hemoglobin: 12 g/dL (ref 11.1–15.9)
Immature Grans (Abs): 0 10*3/uL (ref 0.0–0.1)
Immature Granulocytes: 0 %
LYMPHS ABS: 1.2 10*3/uL (ref 0.7–3.1)
Lymphs: 30 %
MCH: 27 pg (ref 26.6–33.0)
MCHC: 32.7 g/dL (ref 31.5–35.7)
MCV: 83 fL (ref 79–97)
MONOS ABS: 0.4 10*3/uL (ref 0.1–0.9)
Monocytes: 10 %
NEUTROS ABS: 2.2 10*3/uL (ref 1.4–7.0)
Neutrophils: 55 %
PLATELETS: 159 10*3/uL (ref 150–379)
RBC: 4.44 x10E6/uL (ref 3.77–5.28)
RDW: 14.4 % (ref 12.3–15.4)
WBC: 4 10*3/uL (ref 3.4–10.8)

## 2017-11-24 LAB — LIPID PANEL
CHOLESTEROL TOTAL: 230 mg/dL — AB (ref 100–199)
Chol/HDL Ratio: 2.3 ratio (ref 0.0–4.4)
HDL: 102 mg/dL (ref >39)
LDL Calculated: 121 mg/dL — ABNORMAL HIGH (ref 0–99)
Triglycerides: 37 mg/dL (ref 0–149)
VLDL CHOLESTEROL CAL: 7 mg/dL (ref 5–40)

## 2017-12-07 LAB — HM DIABETES EYE EXAM

## 2018-01-16 ENCOUNTER — Encounter: Payer: Self-pay | Admitting: *Deleted

## 2018-02-22 ENCOUNTER — Ambulatory Visit (INDEPENDENT_AMBULATORY_CARE_PROVIDER_SITE_OTHER): Payer: Medicare Other

## 2018-02-22 ENCOUNTER — Ambulatory Visit (INDEPENDENT_AMBULATORY_CARE_PROVIDER_SITE_OTHER): Payer: Medicare Other | Admitting: *Deleted

## 2018-02-22 ENCOUNTER — Encounter: Payer: Self-pay | Admitting: *Deleted

## 2018-02-22 VITALS — BP 136/61 | HR 65 | Ht 64.5 in | Wt 188.0 lb

## 2018-02-22 DIAGNOSIS — Z0000 Encounter for general adult medical examination without abnormal findings: Secondary | ICD-10-CM | POA: Diagnosis not present

## 2018-02-22 DIAGNOSIS — Z1239 Encounter for other screening for malignant neoplasm of breast: Secondary | ICD-10-CM

## 2018-02-22 DIAGNOSIS — M810 Age-related osteoporosis without current pathological fracture: Secondary | ICD-10-CM

## 2018-02-22 NOTE — Progress Notes (Addendum)
Subjective:   Sonya Oliver is a 77 y.o. female who presents for a Medicare Annual Wellness Visit. Sonya Oliver lives at home with her son and daughter-in-law. She has 3 daughters and 1 son. Her husband passed away in 2002-11-17. She enjoys doing puzzle books. She does not drive but has no trouble with transportation.   Review of Systems    Patient reports that her overall health is better compared to last year.  Cardiac Risk Factors include: advanced age (>81men, >59 women);diabetes mellitus;hypertension;dyslipidemia;family history of premature cardiovascular disease;sedentary lifestyle;smoking/ tobacco exposure    All other systems negative       Current Medications (verified) Outpatient Encounter Medications as of 02/22/2018  Medication Sig  . albuterol (PROVENTIL HFA;VENTOLIN HFA) 108 (90 Base) MCG/ACT inhaler Inhale 1-2 puffs into the lungs every 6 (six) hours as needed for wheezing or shortness of breath.  [name] albuterol (PROVENTIL) (2.5 MG/3ML) 0.083% nebulizer solution Inhale 3 mLs into the lungs 4 (four) times daily as needed for shortness of breath.  [name] alendronate (FOSAMAX) 70 MG tablet Take 1 tablet (70 mg total) by mouth once a week. Take with a full glass of water on an empty stomach.  . carvedilol (COREG) 3.125 MG tablet Take 1 tablet (3.125 mg total) by mouth 2 (two) times daily.  . Cholecalciferol (VITAMIN D3) 2000 UNITS TABS Take 1 tablet by mouth daily.  . Fluticasone-Salmeterol (ADVAIR DISKUS) 100-50 MCG/DOSE AEPB Inhale 1 puff into the lungs 2 (two) times daily.  . furosemide (LASIX) 40 MG tablet Take 1 tablet (40 mg total) by mouth 2 (two) times daily. TAKE 1 TABLET BY MOUTH ONCE DAILY  . metFORMIN (GLUCOPHAGE) 500 MG tablet Take 1 tablet (500 mg total) by mouth daily.  . potassium chloride SA (K-DUR,KLOR-CON) 20 MEQ tablet Take 1 tablet (20 mEq total) by mouth daily.  . RESTASIS MULTIDOSE 0.05 % ophthalmic emulsion Place 1 drop into both eyes 2 (two) times daily.  [name]  spironolactone (ALDACTONE) 25 MG tablet Take 1 tablet (25 mg total) by mouth daily.   No facility-administered encounter medications on file as of 02/22/2018.     Allergies (verified) Patient has no known allergies.   History: Past Medical History:  Diagnosis Date  . Arthritis   . Bladder tumor   . Cancer ([place]) 11-16-13   bladder  . Cataract   . CHF (congestive heart failure) ([place])    NO CARDIOLOGIST---  MONITORED BY PCP DR Jacelyn Grip  . COPD with emphysema ([place])    last Acute Exacerbation 08-21-2014  . Dyspnea on exertion   . H/O pulmonary edema    Sonya Oliver 16-Nov-2012--  acute CHF w/ pulmonary edema  . Hypertension   . Hyperthyroidism    currently no meds -- labs stable  . Nocturia   . Nocturnal oxygen desaturation    uses O2  HS via [place]--  and PRN daytime  . Osteoporosis   . Stroke ([place])   . Type 2 diabetes mellitus ([place])   . Ureteral tumor    Past Surgical History:  Procedure Laterality Date  . ABDOMINAL HYSTERECTOMY  1980'S   W/ BILATERAL SALPINGO-OOPHORECTOMY  . CATARACT EXTRACTION W/PHACO Left 01/31/2014   Procedure: CATARACT EXTRACTION PHACO AND INTRAOCULAR LENS PLACEMENT (IOC);  Surgeon: Tonny Branch, MD;  Location: [facility];  Service: Ophthalmology;  Laterality: Left;  CDE: 23.78  . CATARACT EXTRACTION W/PHACO Right 02/28/2014   Procedure: CATARACT EXTRACTION PHACO AND INTRAOCULAR LENS PLACEMENT (IOC);  Surgeon: Tonny Branch, MD;  Location: [facility];  Service: Ophthalmology;  Laterality: Right;  CDE:  11.34  . CYSTOSCOPY  03/23/2012   Procedure: CYSTOSCOPY;  Surgeon: Claybon Jabs, MD;  Location: [facility];  Service: Urology;  Laterality: N/A;  . CYSTOSCOPY WITH RETROGRADE PYELOGRAM, URETEROSCOPY AND STENT PLACEMENT Right 10/21/2014   Procedure: CYSTOSCOPY WITH RETROGRADE PYELOGRAM, URETEROSCOPY, BLADDER TUMOR BIOPSY;  Surgeon: Claybon Jabs, MD;  Location: [facility];  Service: Urology;  Laterality: Right;  . TRANSTHORACIC ECHOCARDIOGRAM  06-21-2013   moderate LVH,  grade I  diastolic dysfunction, ef 65-78%,  mild MR & TR  . TRANSURETHRAL RESECTION OF BLADDER TUMOR  03/23/2012   Procedure: TRANSURETHRAL RESECTION OF BLADDER TUMOR (TURBT);  Surgeon: Claybon Jabs, MD;  Location: [facility];  Service: Urology;  Laterality: N/A;  . TRANSURETHRAL RESECTION OF BLADDER TUMOR N/A 01/08/2013   Procedure: TRANSURETHRAL RESECTION OF BLADDER TUMOR (TURBT);  Surgeon: Claybon Jabs, MD;  Location: [facility];  Service: Urology;  Laterality: N/A;   Family History  Problem Relation Age of Onset  . Diabetes Father   . Congestive Heart Failure Father   . COPD Father   . Kidney disease Father   . Hypertension Father   . Diabetes Mother        with kidney disease  . Kidney disease Mother   . Heart attack Brother   . Asthma Brother   . Diabetes Daughter   . Diabetes Son   . Cancer Brother        throat  . Hypertension Brother    Social History   Socioeconomic History  . Marital status: Widowed    Spouse name: Not on file  . Number of children: 4  . Years of education: 10  . Highest education level: 10th grade  Occupational History  . Occupation: Retired Scientist, clinical (histocompatibility and immunogenetics)  . Financial resource strain: Not hard at all  . Food insecurity:    Worry: Never true    Inability: Never true  . Transportation needs:    Medical: No    Non-medical: No  Tobacco Use  . Smoking status: Former Smoker    Packs/day: 0.25    Years: 50.00    Pack years: 12.50    Types: Cigarettes    Last attempt to quit: 09/24/2006    Years since quitting: 11.4  . Smokeless tobacco: Never Used  Substance and Sexual Activity  . Alcohol use: No  . Drug use: No  . Sexual activity: Not Currently  Lifestyle  . Physical activity:    Days per week: 7 days    Minutes per session: 10 min  . Stress: Not at all  Relationships  . Social connections:    Talks on phone: More than three times a week    Gets together: More than three times a week    Attends religious  service: More than 4 times per year    Active member of club or organization: Yes    Attends meetings of clubs or organizations: More than 4 times per year    Relationship status: Widowed  Other Topics Concern  . Not on file  Social History Narrative   Occupation: worked in [place] yrs    Tobacco Use No.  Clinical Intake:     Pain : 0-10 Pain Score: 4  Pain Type: Chronic pain Pain Location: Knee Pain Orientation: Left Pain Descriptors / Indicators: Aching Pain Onset: More than a month ago Pain Frequency: Intermittent Effect of Pain on Daily  Activities: minimal     Nutritional Status: BMI 25 -29 Overweight Diabetes: Yes CBG done?: No Did pt. bring in CBG monitor from home?: No  How often do you need to have someone help you when you read instructions, pamphlets, or other written materials from your doctor or pharmacy?: 1 - Never What is the last grade level you completed in school?: 10th  Interpreter Needed?: No  Information entered by :: Chong Sicilian, RN   Activities of Daily Living In your present state of health, do you have any difficulty performing the following activities: 02/22/2018  Hearing? N  Vision? N  Comment has yearly exam  Difficulty concentrating or making decisions? N  Walking or climbing stairs? Y  Comment uses walker outside of home  Dressing or bathing? N  Doing errands, shopping? Y  Comment has help with transportation from family for errands  Preparing Food and eating ? N  Using the Toilet? N  In the past six months, have you accidently leaked urine? Y  Comment uses adult diapers for some leakage  Do you have problems with loss of bowel control? N  Managing your Medications? N  Managing your Finances? N  Housekeeping or managing your Housekeeping? N  Some recent data might be hidden     Diet Family prepares meals and she eats regularly. Her son wants her to eat more but sometimes she only eats 2 meals a day. She eats  when she is hungry.  Drinks water and diet soda.   Exercise Current Exercise Habits: The patient does not participate in regular exercise at present, Exercise limited by: orthopedic condition(s)   Depression Screen PHQ 2/9 Scores 02/22/2018 11/23/2017 05/25/2017 02/21/2017 12/22/2016 08/11/2016 07/08/2016  PHQ - 2 Score 3 1 0 0 0 0 0  PHQ- 9 Score 4 - - - - - -     Fall Risk Fall Risk  02/22/2018 05/25/2017 02/21/2017 12/22/2016 07/08/2016  Falls in the past year? No No No No Yes  Comment - - - - -  Number falls in past yr: - - - - 1  Injury with Fall? - - - - No  Risk for fall due to : - - - - Impaired mobility  Risk for fall due to: Comment - - - - uses rolling walker  Follow up - - - - Falls prevention discussed    Safety Is the patient's home free of loose throw rugs in walkways, pet beds, electrical cords, etc?   yes      Grab bars in the bathroom? yes      Walkin shower? yes      Shower Seat? yes      Handrails on the stairs?   yes Doe not go up and down stairs though      Adequate lighting?   yes  Patient Care Team: Theodoro Clock as PCP - General (Physician Assistant) Kathie Rhodes, MD as Consulting Physician (Urology) Steffanie Rain[place], DPM as Consulting Physician (Podiatry) Melina Schools, OD as Consulting Physician (Optometry) Sinda Du, MD as Consulting Physician (Pulmonary Disease) Jacelyn Pi, MD as Consulting Physician (Endocrinology)   No hospitalizations, ER visits, or surgeries this past year.   Objective:    Today's Vitals   02/22/18 1114 02/22/18 1119  BP: 136/61   Pulse: 65   Weight: 188 lb (85.3 kg)   Height: 5' 4.5" (1.638 m)   PainSc:  4    Body mass index is 31.77 kg/m.  Advanced Directives 02/22/2018  12/03/2016 09/29/2016 09/28/2016 07/08/2016 02/07/2016 02/06/2016  Does Patient Have a Medical Advance Directive? Yes No Yes No Yes No No  Type of Advance Directive Healthcare Power of [place]  Does patient want to make changes to medical advance directive? No - Patient declined - No - Patient declined - No - Patient declined - -  Copy of [place] in Chart? No - copy requested - - - No - copy requested No - copy requested No - copy requested  Would patient like information on creating a medical advance directive? - No - Patient declined - - - - -  Pre-existing out of facility DNR order (yellow form or pink MOST form) - - - - - - -    Hearing/Vision  normal or No deficits noted during visit.  Cognitive Function: MMSE - Mini Mental State Exam 02/22/2018 02/22/2018 07/08/2016 03/07/2015  Orientation to time 5 5 5 5   Orientation to Place 5 5 5 5   Registration 3 3 3 3   Attention/ Calculation 5 5 4 5   Recall 2 1 3 3   Language- name 2 objects 2 - 2 2  Language- repeat 1 - 1 1  Language- follow 3 step command 3 - 3 3  Language- read & follow direction 1 - 1 1  Write a sentence 1 - 1 1  Copy design 1 - 0 -  Total score 29 - 28 -       Normal Cognitive Function Screening: Yes    Immunizations and Health Maintenance Immunization History  Administered Date(s) Administered  . Influenza, High Dose Seasonal PF 05/25/2017  . Influenza,inj,Quad PF,6+ Mos 06/20/2013, 05/30/2014, 06/09/2015, 06/10/2016  . Pneumococcal Conjugate-13 03/07/2015  . Pneumococcal Polysaccharide-23 04/26/2011  . Tdap 04/26/2011  . Zoster 04/16/2013   Health Maintenance Due  Topic Date Due  . DEXA SCAN  03/06/2017  . FOOT EXAM  07/08/2017  . URINE MICROALBUMIN  07/08/2017  . MAMMOGRAM  09/29/2017   Health Maintenance  Topic Date Due  . DEXA SCAN  03/06/2017  . FOOT EXAM  07/08/2017  . URINE MICROALBUMIN  07/08/2017  . MAMMOGRAM  09/29/2017  . INFLUENZA VACCINE  03/30/2018  . HEMOGLOBIN A1C  05/26/2018  . OPHTHALMOLOGY EXAM  12/08/2018  . TETANUS/TDAP  04/25/2021  . PNA vac Low Risk Adult  Completed         Assessment:   This is a routine wellness examination for [place].    Plan:    Goals    . Exercise 3x per week (30 min per time)     Low impact seated exercises        Health Maintenance Recommendations: Bone densitometry screening Diabetic Foot Exam  Urine Microalbumin  Additional Screening Recommendations: Lung: Low Dose CT Chest recommended if Age 10-80 years, 30 pack-year currently smoking OR have quit w/in 15years. Patient does not qualify. Hepatitis C Screening recommended: not applicable  Today's Orders Orders Placed This Encounter  Procedures  . DG [facility] DEXA    Order Specific Question:   Reason for Exam (SYMPTOM  OR DIAGNOSIS REQUIRED)    Answer:   osteoporosis  . MM DIGITAL SCREENING BILATERAL    Standing Status:   Future    Standing Expiration Date:   04/25/2019    Scheduling Instructions:     Has had prior films at [facility] mobile [facility] but [facility] is closer to home  Order Specific Question:   Reason for Exam (SYMPTOM  OR DIAGNOSIS REQUIRED)    Answer:   screening mammogram    Order Specific Question:   Preferred imaging location?    Answer:   [facility]    Keep f/u with Terald Sleeper, PA-C and any other specialty appointments you may have Continue current medications Move carefully to avoid falls. Use assistive devices like a cane or walker if needed. Aim for at least 150 minutes of moderate activity a week. This can be done with chair exercises if necessary. Read or work puzzles daily Stay connected with friends and family  I have personally reviewed and noted the following in the patient's chart:   . Medical and social history . Use of alcohol, tobacco or illicit drugs  . Current medications and supplements . Functional ability and status . Nutritional status . Physical activity . Advanced directives . List of other physicians . Hospitalizations, surgeries, and ER visits in previous 12 months . Vitals . Screenings to include  cognitive, depression, and falls . Referrals and appointments  In addition, I have reviewed and discussed with patient certain preventive protocols, quality metrics, and best practice recommendations. A written personalized care plan for preventive services as well as general preventive health recommendations were provided to patient.     Chong Sicilian, RN   02/22/2018   I have reviewed and agree with the above AWV documentation.   Terald Sleeper PA-C [facility] [address]  [place], [place] [address] [phone]

## 2018-02-22 NOTE — Patient Instructions (Signed)
  Ms. Wigle , Thank you for taking time to come for your Medicare Wellness Visit. I appreciate your ongoing commitment to your health goals. Please review the following plan we discussed and let me know if I can assist you in the future.   These are the goals we discussed: Goals    . Exercise 3x per week (30 min per time)     Low impact seated exercises       This is a list of the screening recommended for you and due dates:  Health Maintenance  Topic Date Due  . DEXA scan (bone density measurement)  03/06/2017  . Complete foot exam   07/08/2017  . Urine Protein Check  07/08/2017  . Mammogram  09/29/2017  . Flu Shot  03/30/2018  . Hemoglobin A1C  05/26/2018  . Eye exam for diabetics  12/08/2018  . Tetanus Vaccine  04/25/2021  . Pneumonia vaccines  Completed   I am ordering a mammogram at [facility]. They will call you with the appointment information.

## 2018-03-10 ENCOUNTER — Ambulatory Visit (INDEPENDENT_AMBULATORY_CARE_PROVIDER_SITE_OTHER): Payer: Medicare Other | Admitting: Pharmacist Clinician (PhC)/ Clinical Pharmacy Specialist

## 2018-03-10 ENCOUNTER — Telehealth: Payer: Self-pay | Admitting: Pharmacist Clinician (PhC)/ Clinical Pharmacy Specialist

## 2018-03-10 ENCOUNTER — Encounter: Payer: Self-pay | Admitting: Pharmacist Clinician (PhC)/ Clinical Pharmacy Specialist

## 2018-03-10 DIAGNOSIS — M810 Age-related osteoporosis without current pathological fracture: Secondary | ICD-10-CM

## 2018-03-10 NOTE — Progress Notes (Signed)
Sonya Oliver is a 77 year old female who has a history of osteoporosis and treatment for approxiamately a 1 1/2 years with fosamax per OV notes and records.  She has a recent dexascan that showed worsening of her right hip from -2.9 to -3.2 with her spinal remaining stable in the osteopenia range -1.2.  Patient is taking calcium with vitamin D once daily but does not know how much or which type she is taking.  She does take precautions against falling and uses a walker.  I covered all fall prevention guidelines with her.  She is opposed to any type of injection.  I discussed Prolia in detail with her and gave her written patient information. A/P:  1.  Osteoporosis:  Patient has not been taking fosamax since March 2019 based on pharmacy fill dates.  She states that she forgot to pick it up but will start back on it today.  She refuses to take Prolia at this time.    2.  Repeat dexascan in 1 year due to decline in BMD and patient' medication non-adherence.  3.  Patient will check calcium when she gets home and ensure she is taking calcium citrate 1200mg  a day with her current vitamin D.  Total time with patient:  45 minutes  Memory Argue, PharmD, CPP, FNLA

## 2018-05-23 ENCOUNTER — Ambulatory Visit: Payer: Medicare Other | Admitting: Physician Assistant

## 2018-05-24 ENCOUNTER — Encounter: Payer: Self-pay | Admitting: Physician Assistant

## 2018-05-24 ENCOUNTER — Ambulatory Visit: Payer: Medicare Other | Admitting: Physician Assistant

## 2018-05-24 DIAGNOSIS — J449 Chronic obstructive pulmonary disease, unspecified: Secondary | ICD-10-CM | POA: Diagnosis not present

## 2018-05-24 DIAGNOSIS — E876 Hypokalemia: Secondary | ICD-10-CM

## 2018-05-24 DIAGNOSIS — I10 Essential (primary) hypertension: Secondary | ICD-10-CM

## 2018-05-24 DIAGNOSIS — J441 Chronic obstructive pulmonary disease with (acute) exacerbation: Secondary | ICD-10-CM

## 2018-05-24 DIAGNOSIS — Z23 Encounter for immunization: Secondary | ICD-10-CM | POA: Diagnosis not present

## 2018-05-24 DIAGNOSIS — E119 Type 2 diabetes mellitus without complications: Secondary | ICD-10-CM

## 2018-05-24 LAB — BAYER DCA HB A1C WAIVED: HB A1C (BAYER DCA - WAIVED): 6 % (ref <7.0)

## 2018-05-24 MED ORDER — CYCLOSPORINE 0.05 % OP EMUL: 1.0000 [drp] | Freq: Two times a day (BID) | OPHTHALMIC | 11 refills | Status: DC

## 2018-05-24 MED ORDER — ALBUTEROL SULFATE (2.5 MG/3ML) 0.083% IN NEBU: 3.0000 mL | INHALATION_SOLUTION | Freq: Four times a day (QID) | RESPIRATORY_TRACT | 11 refills | Status: DC | PRN

## 2018-05-24 MED ORDER — FLUTICASONE-SALMETEROL 100-50 MCG/DOSE IN AEPB: 1.0000 | INHALATION_SPRAY | Freq: Two times a day (BID) | RESPIRATORY_TRACT | 11 refills | Status: DC

## 2018-05-24 MED ORDER — METFORMIN HCL 500 MG PO TABS: 500.0000 mg | ORAL_TABLET | Freq: Every day | ORAL | 1 refills | Status: DC

## 2018-05-24 MED ORDER — FUROSEMIDE 40 MG PO TABS: 40.0000 mg | ORAL_TABLET | Freq: Every day | ORAL | 11 refills | Status: DC

## 2018-05-24 MED ORDER — CARVEDILOL 3.125 MG PO TABS: 3.1250 mg | ORAL_TABLET | Freq: Two times a day (BID) | ORAL | 3 refills | Status: DC

## 2018-05-24 MED ORDER — POTASSIUM CHLORIDE CRYS ER 20 MEQ PO TBCR: 20.0000 meq | EXTENDED_RELEASE_TABLET | Freq: Every day | ORAL | 11 refills | Status: DC

## 2018-05-24 NOTE — Patient Instructions (Signed)
In a few days you may receive a survey in the mail or online from [employer] regarding your visit with us today. Please take a moment to fill this out. Your feedback is very important to our whole office. It can help us better understand your needs as well as improve your experience and satisfaction. Thank you for taking your time to complete it. We care about you.  Travonte Byard, PA-C  

## 2018-05-25 LAB — CMP14+EGFR
ALBUMIN: 4.4 g/dL (ref 3.5–4.8)
ALT: 9 IU/L (ref 0–32)
AST: 19 IU/L (ref 0–40)
Albumin/Globulin Ratio: 1.3 (ref 1.2–2.2)
Alkaline Phosphatase: 51 IU/L (ref 39–117)
BUN / CREAT RATIO: 20 (ref 12–28)
BUN: 14 mg/dL (ref 8–27)
Bilirubin Total: 0.4 mg/dL (ref 0.0–1.2)
CALCIUM: 9.7 mg/dL (ref 8.7–10.3)
CO2: 28 mmol/L (ref 20–29)
CREATININE: 0.69 mg/dL (ref 0.57–1.00)
Chloride: 99 mmol/L (ref 96–106)
GFR, EST AFRICAN AMERICAN: 96 mL/min/{1.73_m2} (ref >59)
GFR, EST NON AFRICAN AMERICAN: 84 mL/min/{1.73_m2} (ref >59)
GLOBULIN, TOTAL: 3.3 g/dL (ref 1.5–4.5)
GLUCOSE: 82 mg/dL (ref 65–99)
Potassium: 4.8 mmol/L (ref 3.5–5.2)
SODIUM: 140 mmol/L (ref 134–144)
TOTAL PROTEIN: 7.7 g/dL (ref 6.0–8.5)

## 2018-05-25 LAB — LIPID PANEL
CHOL/HDL RATIO: 2 ratio (ref 0.0–4.4)
Cholesterol, Total: 218 mg/dL — ABNORMAL HIGH (ref 100–199)
HDL: 108 mg/dL (ref >39)
LDL CALC: 98 mg/dL (ref 0–99)
Triglycerides: 59 mg/dL (ref 0–149)
VLDL CHOLESTEROL CAL: 12 mg/dL (ref 5–40)

## 2018-05-25 LAB — CBC WITH DIFFERENTIAL/PLATELET
Basophils Absolute: 0 10*3/uL (ref 0.0–0.2)
Basos: 0 %
EOS (ABSOLUTE): 0.3 10*3/uL (ref 0.0–0.4)
Eos: 6 %
HEMOGLOBIN: 12.4 g/dL (ref 11.1–15.9)
Hematocrit: 38.9 % (ref 34.0–46.6)
IMMATURE GRANS (ABS): 0 10*3/uL (ref 0.0–0.1)
Immature Granulocytes: 0 %
LYMPHS ABS: 1.6 10*3/uL (ref 0.7–3.1)
LYMPHS: 31 %
MCH: 26.5 pg — ABNORMAL LOW (ref 26.6–33.0)
MCHC: 31.9 g/dL (ref 31.5–35.7)
MCV: 83 fL (ref 79–97)
MONOCYTES: 10 %
Monocytes Absolute: 0.6 10*3/uL (ref 0.1–0.9)
Neutrophils Absolute: 2.8 10*3/uL (ref 1.4–7.0)
Neutrophils: 53 %
Platelets: 190 10*3/uL (ref 150–450)
RBC: 4.68 x10E6/uL (ref 3.77–5.28)
RDW: 12.9 % (ref 12.3–15.4)
WBC: 5.3 10*3/uL (ref 3.4–10.8)

## 2018-05-29 NOTE — Progress Notes (Signed)
BP (!) 157/66   Pulse 66   Temp (!) 97.2 F (36.2 C)   Ht _0  (1.676 m)   Wt 190 lb (86.2 kg)   BMI 30.67 kg/m    Subjective:    Patient ID: Sonya Oliver, female    DOB: [birth year], 77 y.o.   MRN: [id number]  HPI: Sonya Oliver is a 77 y.o. female presenting on 05/24/2018 for Follow-up This patient comes in for periodic recheck on her chronic medical conditions which do include COPD, asthma, hypertension, history of heart failure, diabetes.  She states that she is doing very well overall and not having any difficulties.  Her numbers have been very good.  We will have labs performed in the near future.  She also does need her Restasis refill.  She has used this for her chronic dry eye.  It was given by ophthalmology in the past.  We will go ahead and refill it.  There are no other complaints at this time.    Past Medical History:  Diagnosis Date  . Arthritis   . Bladder tumor   . Cancer ([place]onya Oliver) 2015   bladder  . Cataract   . CHF (congestive heart failure) ([place])    NO CARDIOLOGIST---  MONITORED BY PCP DR Jacelyn Grip  . COPD with emphysema ([place])    last Acute Exacerbation 08-21-2014  . Dyspnea on exertion   . H/O pulmonary edema    jan 2014--  acute CHF w/ pulmonary edema  . Hypertension   . Hyperthyroidism    currently no meds -- labs stable  . Nocturia   . Nocturnal oxygen desaturation    uses O2  HS via [place]--  and PRN daytime  . Osteoporosis   . Stroke ([place])   . Type 2 diabetes mellitus ([place])   . Ureteral tumor    Relevant past medical, surgical, family and social history reviewed and updated as indicated. Interim medical history since our last visit reviewed. Allergies and medications reviewed and updated. DATA REVIEWED: CHART IN EPIC  Family History reviewed for pertinent findings.  Review of Systems  Constitutional: Negative.   HENT: Negative.   Eyes: Negative.   Respiratory: Positive for shortness of breath and wheezing.   Cardiovascular: Negative.     Gastrointestinal: Negative.   Genitourinary: Negative.   Musculoskeletal: Positive for arthralgias and gait problem.  Neurological: Positive for weakness.    Allergies as of 05/24/2018   No Known Allergies     Medication List        Accurate as of 05/24/18 11:59 PM. Always use your most recent med list.          albuterol 108 (90 Base) MCG/ACT inhaler Commonly known as:  PROVENTIL HFA;VENTOLIN HFA Inhale 1-2 puffs into the lungs every 6 (six) hours as needed for wheezing or shortness of breath.   albuterol (2.5 MG/3ML) 0.083% nebulizer solution Commonly known as:  PROVENTIL Inhale 3 mLs into the lungs 4 (four) times daily as needed for shortness of breath.   alendronate 70 MG tablet Commonly known as:  FOSAMAX Take 1 tablet (70 mg total) by mouth once a week. Take with a full glass of water on an empty stomach.   carvedilol 3.125 MG tablet Commonly known as:  COREG Take 1 tablet (3.125 mg total) by mouth 2 (two) times daily.   cycloSPORINE 0.05 % ophthalmic emulsion Commonly known as:  RESTASIS Place 1 drop into both eyes 2 (two) times daily.   Fluticasone-Salmeterol 100-50  MCG/DOSE Aepb Commonly known as:  ADVAIR Inhale 1 puff into the lungs 2 (two) times daily.   furosemide 40 MG tablet Commonly known as:  LASIX Take 1 tablet (40 mg total) by mouth daily. TAKE 1 TABLET BY MOUTH ONCE DAILY   metFORMIN 500 MG tablet Commonly known as:  GLUCOPHAGE Take 1 tablet (500 mg total) by mouth daily.   potassium chloride SA 20 MEQ tablet Commonly known as:  K-DUR,KLOR-CON Take 1 tablet (20 mEq total) by mouth daily.   Vitamin D3 2000 units Tabs Take 1 tablet by mouth daily.          Objective:    BP (!) 157/66   Pulse 66   Temp (!) 97.2 F (36.2 C)   Ht _0  (1.676 m)   Wt 190 lb (86.2 kg)   BMI 30.67 kg/m   No Known Allergies  Wt Readings from Last 3 Encounters:  05/24/18 190 lb (86.2 kg)  02/22/18 188 lb (85.3 kg)  11/23/17 180 lb 9.6 oz (81.9 kg)     Physical Exam  Constitutional: She is oriented to person, place, and time. She appears well-developed and well-nourished.  HENT:  Head: Normocephalic and atraumatic.  Right Ear: Tympanic membrane, external ear and ear canal normal.  Left Ear: Tympanic membrane, external ear and ear canal normal.  Nose: Nose normal. No rhinorrhea.  Mouth/Throat: Oropharynx is clear and moist and mucous membranes are normal. No oropharyngeal exudate or posterior oropharyngeal erythema.  Eyes: Pupils are equal, round, and reactive to light. Conjunctivae and EOM are normal.  Neck: Normal range of motion. Neck supple.  Cardiovascular: Normal rate, regular rhythm, normal heart sounds and intact distal pulses.  Pulmonary/Chest: Effort normal and breath sounds normal.  Abdominal: Soft. Bowel sounds are normal.  Neurological: She is alert and oriented to person, place, and time. She has normal reflexes.  Skin: Skin is warm and dry. No rash noted.  Psychiatric: She has a normal mood and affect. Her behavior is normal. Judgment and thought content normal.    Results for orders placed or performed in visit on 05/24/18  CBC with Differential/Platelet  Result Value Ref Range   WBC 5.3 3.4 - 10.8 x10E3/uL   RBC 4.68 3.77 - 5.28 x10E6/uL   Hemoglobin 12.4 11.1 - 15.9 g/dL   Hematocrit 38.9 34.0 - 46.6 %   MCV 83 79 - 97 fL   MCH 26.5 (L) 26.6 - 33.0 pg   MCHC 31.9 31.5 - 35.7 g/dL   RDW 12.9 12.3 - 15.4 %   Platelets 190 150 - 450 x10E3/uL   Neutrophils 53 Not Estab. %   Lymphs 31 Not Estab. %   Monocytes 10 Not Estab. %   Eos 6 Not Estab. %   Basos 0 Not Estab. %   Neutrophils Absolute 2.8 1.4 - 7.0 x10E3/uL   Lymphocytes Absolute 1.6 0.7 - 3.1 x10E3/uL   Monocytes Absolute 0.6 0.1 - 0.9 x10E3/uL   EOS (ABSOLUTE) 0.3 0.0 - 0.4 x10E3/uL   Basophils Absolute 0.0 0.0 - 0.2 x10E3/uL   Immature Granulocytes 0 Not Estab. %   Immature Grans (Abs) 0.0 0.0 - 0.1 x10E3/uL  CMP14+EGFR  Result Value Ref Range    Glucose 82 65 - 99 mg/dL   BUN 14 8 - 27 mg/dL   Creatinine, Ser 0.69 0.57 - 1.00 mg/dL   GFR calc non Af Amer 84 >59 mL/min/1.73   GFR calc Af Amer 96 >59 mL/min/1.73   BUN/Creatinine Ratio 20 12 -  28   Sodium 140 134 - 144 mmol/L   Potassium 4.8 3.5 - 5.2 mmol/L   Chloride 99 96 - 106 mmol/L   CO2 28 20 - 29 mmol/L   Calcium 9.7 8.7 - 10.3 mg/dL   Total Protein 7.7 6.0 - 8.5 g/dL   Albumin 4.4 3.5 - 4.8 g/dL   Globulin, Total 3.3 1.5 - 4.5 g/dL   Albumin/Globulin Ratio 1.3 1.2 - 2.2   Bilirubin Total 0.4 0.0 - 1.2 mg/dL   Alkaline Phosphatase 51 39 - 117 IU/L   AST 19 0 - 40 IU/L   ALT 9 0 - 32 IU/L  Lipid panel  Result Value Ref Range   Cholesterol, Total 218 (H) 100 - 199 mg/dL   Triglycerides 59 0 - 149 mg/dL   HDL 108 >39 mg/dL   VLDL Cholesterol Cal 12 5 - 40 mg/dL   LDL Calculated 98 0 - 99 mg/dL   Chol/HDL Ratio 2.0 0.0 - 4.4 ratio  Bayer DCA Hb A1c Waived  Result Value Ref Range   HB A1C (BAYER DCA - WAIVED) 6.0 <7.0 %      Assessment & Plan:   1. Chronic obstructive pulmonary disease with acute exacerbation (HCC) - Fluticasone-Salmeterol (ADVAIR DISKUS) 100-50 MCG/DOSE AEPB; Inhale 1 puff into the lungs 2 (two) times daily.  Dispense: 60 each; Refill: 11 - albuterol (PROVENTIL) (2.5 MG/3ML) 0.083% nebulizer solution; Inhale 3 mLs into the lungs 4 (four) times daily as needed for shortness of breath.  Dispense: 240 mL; Refill: 11  2. Chronic obstructive pulmonary disease, unspecified COPD type (HCC) - albuterol (PROVENTIL) (2.5 MG/3ML) 0.083% nebulizer solution; Inhale 3 mLs into the lungs 4 (four) times daily as needed for shortness of breath.  Dispense: 240 mL; Refill: 11  3. Essential hypertension - carvedilol (COREG) 3.125 MG tablet; Take 1 tablet (3.125 mg total) by mouth 2 (two) times daily.  Dispense: 180 tablet; Refill: 3 - furosemide (LASIX) 40 MG tablet; Take 1 tablet (40 mg total) by mouth daily. TAKE 1 TABLET BY MOUTH ONCE DAILY  Dispense: 30 tablet;  Refill: 11 - CBC with Differential/Platelet - CMP14+EGFR - Lipid panel - Bayer DCA Hb A1c Waived  4. Diabetes mellitus without complication (HCC) - metFORMIN (GLUCOPHAGE) 500 MG tablet; Take 1 tablet (500 mg total) by mouth daily.  Dispense: 90 tablet; Refill: 1 - CBC with Differential/Platelet - CMP14+EGFR - Lipid panel - Bayer DCA Hb A1c Waived  5. Hypokalemia - potassium chloride SA (K-DUR,KLOR-CON) 20 MEQ tablet; Take 1 tablet (20 mEq total) by mouth daily.  Dispense: 30 tablet; Refill: 11  6. Encounter for immunization - Flu vaccine HIGH DOSE PF   Continue all other maintenance medications as listed above.  Follow up plan: Return in about 6 months (around 11/22/2018).  Educational handout given for [place] PA-C [place] [address]  [place], [place] [address] [phone]   05/29/2018, 1:17 PM

## 2018-09-23 ENCOUNTER — Other Ambulatory Visit: Payer: Self-pay | Admitting: Physician Assistant

## 2018-09-23 DIAGNOSIS — E119 Type 2 diabetes mellitus without complications: Secondary | ICD-10-CM

## 2018-11-22 ENCOUNTER — Ambulatory Visit (INDEPENDENT_AMBULATORY_CARE_PROVIDER_SITE_OTHER): Payer: Medicare Other | Admitting: Physician Assistant

## 2018-11-22 ENCOUNTER — Other Ambulatory Visit: Payer: Self-pay

## 2018-11-22 ENCOUNTER — Encounter: Payer: Self-pay | Admitting: Physician Assistant

## 2018-11-22 ENCOUNTER — Ambulatory Visit (INDEPENDENT_AMBULATORY_CARE_PROVIDER_SITE_OTHER): Payer: Medicare Other

## 2018-11-22 VITALS — BP 159/87 | HR 80 | Temp 97.3°F | Ht 66.0 in | Wt 175.4 lb

## 2018-11-22 DIAGNOSIS — M25562 Pain in left knee: Secondary | ICD-10-CM

## 2018-11-22 DIAGNOSIS — I10 Essential (primary) hypertension: Secondary | ICD-10-CM | POA: Diagnosis not present

## 2018-11-22 DIAGNOSIS — E119 Type 2 diabetes mellitus without complications: Secondary | ICD-10-CM

## 2018-11-22 DIAGNOSIS — J441 Chronic obstructive pulmonary disease with (acute) exacerbation: Secondary | ICD-10-CM

## 2018-11-22 DIAGNOSIS — M810 Age-related osteoporosis without current pathological fracture: Secondary | ICD-10-CM | POA: Diagnosis not present

## 2018-11-22 MED ORDER — ALENDRONATE SODIUM 70 MG PO TABS: 70.0000 mg | ORAL_TABLET | ORAL | 11 refills | Status: DC

## 2018-11-22 MED ORDER — ALBUTEROL SULFATE HFA 108 (90 BASE) MCG/ACT IN AERS: 1.0000 | INHALATION_SPRAY | Freq: Four times a day (QID) | RESPIRATORY_TRACT | 11 refills | Status: DC | PRN

## 2018-11-22 MED ORDER — PREDNISONE 10 MG (48) PO TBPK: ORAL_TABLET | ORAL | 0 refills | Status: DC

## 2018-11-22 NOTE — Progress Notes (Signed)
BP (!) 159/87   Pulse 80   Temp (!) 97.3 F (36.3 C) (Oral)   Ht 5' 6"  (1.676 m)   Wt 175 lb 6.4 oz (79.6 kg)   SpO2 94%   BMI 28.31 kg/m    Subjective:    Patient ID: Sonya Oliver, female    DOB: 03-Feb-1941, 78 y.o.   MRN: [id number]  HPI: Sonya Oliver is a 78 y.o. female presenting on 11/22/2018 for COPD (3 month ); Diabetes; and Medical Management of Chronic Issues  This patient comes in for periodic recheck on her chronic medical conditions.  They do include COPD, diabetes, osteoporosis, hypertension and she is having left knee pain.  She reports that about 4 weeks ago she fell and injured her knee.  She just tried to take care of it very slowly and carefully but it has increased in pain.  She has not fallen again.  And the leg has not fallen out from under her.  The remainder of her symptoms have been quite good.  She reports that her breathing has been fairly stable she does get hypoxic upon walking.  And there was evidence of that here today.  After she walked and got into the room she was 83% on 4 L of oxygen.  After she recovered she came up to 94%.  Blood pressure was quite elevated when she first got here and as she sat and rested it did come down.  She denies any chest pain, edema.  Past Medical History:  Diagnosis Date  . Arthritis   . Bladder tumor   . Cancer ([place]) 2015   bladder  . Cataract   . CHF (congestive heart failure) ([place])    NO CARDIOLOGIST---  MONITORED BY PCP DR Jacelyn Grip  . COPD with emphysema ([place])    last Acute Exacerbation 08-21-2014  . Dyspnea on exertion   . H/O pulmonary edema    jan 2014--  acute CHF w/ pulmonary edema  . Hypertension   . Hyperthyroidism    currently no meds -- labs stable  . Nocturia   . Nocturnal oxygen desaturation    uses O2  HS via [place]--  and PRN daytime  . Osteoporosis   . Stroke ([place])   . Type 2 diabetes mellitus ([place])   . Ureteral tumor    Relevant past medical, surgical, family and social history reviewed  and updated as indicated. Interim medical history since our last visit reviewed. Allergies and medications reviewed and updated. DATA REVIEWED: CHART IN EPIC  Family History reviewed for pertinent findings.  Review of Systems  Constitutional: Positive for fatigue. Negative for activity change and fever.  HENT: Negative.   Eyes: Negative.   Respiratory: Positive for shortness of breath and wheezing. Negative for cough.   Cardiovascular: Negative.  Negative for chest pain.  Gastrointestinal: Negative.  Negative for abdominal pain.  Endocrine: Negative.   Genitourinary: Negative.  Negative for dysuria.  Musculoskeletal: Positive for arthralgias and joint swelling.  Skin: Negative.   Neurological: Negative.     Allergies as of 11/22/2018   No Known Allergies     Medication List       Accurate as of November 22, 2018 11:59 PM. Always use your most recent med list.        albuterol (2.5 MG/3ML) 0.083% nebulizer solution Commonly known as:  PROVENTIL Inhale 3 mLs into the lungs 4 (four) times daily as needed for shortness of breath.   albuterol 108 (90 Base) MCG/ACT  inhaler Commonly known as:  PROVENTIL HFA;VENTOLIN HFA Inhale 1-2 puffs into the lungs every 6 (six) hours as needed for wheezing or shortness of breath.   alendronate 70 MG tablet Commonly known as:  Fosamax Take 1 tablet (70 mg total) by mouth once a week. Take with a full glass of water on an empty stomach.   carvedilol 3.125 MG tablet Commonly known as:  COREG Take 1 tablet (3.125 mg total) by mouth 2 (two) times daily.   cycloSPORINE 0.05 % ophthalmic emulsion Commonly known as:  Restasis MultiDose Place 1 drop into both eyes 2 (two) times daily.   Fluticasone-Salmeterol 100-50 MCG/DOSE Aepb Commonly known as:  Advair Diskus Inhale 1 puff into the lungs 2 (two) times daily.   furosemide 40 MG tablet Commonly known as:  LASIX Take 1 tablet (40 mg total) by mouth daily. TAKE 1 TABLET BY MOUTH ONCE DAILY    metFORMIN 500 MG tablet Commonly known as:  GLUCOPHAGE Take 1 tablet (500 mg total) by mouth daily.   potassium chloride SA 20 MEQ tablet Commonly known as:  K-DUR,KLOR-CON Take 1 tablet (20 mEq total) by mouth daily.   predniSONE 10 MG (48) Tbpk tablet Commonly known as:  STERAPRED UNI-PAK 48 TAB Take as directed for 12 days   Vitamin D3 50 MCG (2000 UT) Tabs Take 1 tablet by mouth daily.          Objective:    BP (!) 159/87   Pulse 80   Temp (!) 97.3 F (36.3 C) (Oral)   Ht 5' 6"  (1.676 m)   Wt 175 lb 6.4 oz (79.6 kg)   SpO2 94%   BMI 28.31 kg/m   No Known Allergies  Wt Readings from Last 3 Encounters:  11/22/18 175 lb 6.4 oz (79.6 kg)  05/24/18 190 lb (86.2 kg)  02/22/18 188 lb (85.3 kg)    Physical Exam Constitutional:      Appearance: She is well-developed.  HENT:     Head: Normocephalic and atraumatic.  Eyes:     Conjunctiva/sclera: Conjunctivae normal.     Pupils: Pupils are equal, round, and reactive to light.  Cardiovascular:     Rate and Rhythm: Normal rate and regular rhythm.     Heart sounds: Normal heart sounds.  Pulmonary:     Effort: Pulmonary effort is normal.     Breath sounds: Normal breath sounds.  Abdominal:     General: Bowel sounds are normal.     Palpations: Abdomen is soft.  Musculoskeletal:     Left knee: She exhibits decreased range of motion, swelling and bony tenderness.  Skin:    General: Skin is warm and dry.     Findings: No rash.  Neurological:     Mental Status: She is alert and oriented to person, place, and time.     Deep Tendon Reflexes: Reflexes are normal and symmetric.  Psychiatric:        Behavior: Behavior normal.        Thought Content: Thought content normal.        Judgment: Judgment normal.     Results for orders placed or performed in visit on 11/22/18  CBC with Differential/Platelet  Result Value Ref Range   WBC 3.8 3.4 - 10.8 x10E3/uL   RBC 4.76 3.77 - 5.28 x10E6/uL   Hemoglobin 12.6 11.1 - 15.9  g/dL   Hematocrit 39.8 34.0 - 46.6 %   MCV 84 79 - 97 fL   MCH 26.5 (L) 26.6 -  33.0 pg   MCHC 31.7 31.5 - 35.7 g/dL   RDW 13.0 11.7 - 15.4 %   Platelets 158 150 - 450 x10E3/uL   Neutrophils 59 Not Estab. %   Lymphs 30 Not Estab. %   Monocytes 9 Not Estab. %   Eos 2 Not Estab. %   Basos 0 Not Estab. %   Neutrophils Absolute 2.3 1.4 - 7.0 x10E3/uL   Lymphocytes Absolute 1.1 0.7 - 3.1 x10E3/uL   Monocytes Absolute 0.4 0.1 - 0.9 x10E3/uL   EOS (ABSOLUTE) 0.1 0.0 - 0.4 x10E3/uL   Basophils Absolute 0.0 0.0 - 0.2 x10E3/uL   Immature Granulocytes 0 Not Estab. %   Immature Grans (Abs) 0.0 0.0 - 0.1 x10E3/uL  CMP14+EGFR  Result Value Ref Range   Glucose 71 65 - 99 mg/dL   BUN 15 8 - 27 mg/dL   Creatinine, Ser 0.89 0.57 - 1.00 mg/dL   GFR calc non Af Amer 62 >59 mL/min/1.73   GFR calc Af Amer 71 >59 mL/min/1.73   BUN/Creatinine Ratio 17 12 - 28   Sodium 141 134 - 144 mmol/L   Potassium 3.8 3.5 - 5.2 mmol/L   Chloride 98 96 - 106 mmol/L   CO2 27 20 - 29 mmol/L   Calcium 9.3 8.7 - 10.3 mg/dL   Total Protein 7.8 6.0 - 8.5 g/dL   Albumin 4.0 3.7 - 4.7 g/dL   Globulin, Total 3.8 1.5 - 4.5 g/dL   Albumin/Globulin Ratio 1.1 (L) 1.2 - 2.2   Bilirubin Total 0.7 0.0 - 1.2 mg/dL   Alkaline Phosphatase 48 39 - 117 IU/L   AST 17 0 - 40 IU/L   ALT 8 0 - 32 IU/L  Lipid panel  Result Value Ref Range   Cholesterol, Total 200 (H) 100 - 199 mg/dL   Triglycerides 60 0 - 149 mg/dL   HDL 78 >39 mg/dL   VLDL Cholesterol Cal 12 5 - 40 mg/dL   LDL Calculated 110 (H) 0 - 99 mg/dL   Chol/HDL Ratio 2.6 0.0 - 4.4 ratio  Microalbumin / creatinine urine ratio  Result Value Ref Range   Creatinine, Urine 218.3 Not Estab. mg/dL   Microalbumin, Urine 12.4 Not Estab. ug/mL   Microalb/Creat Ratio 6 0 - 29 mg/g creat      Assessment & Plan:   1. Chronic obstructive pulmonary disease with acute exacerbation (HCC) - albuterol (PROVENTIL HFA;VENTOLIN HFA) 108 (90 Base) MCG/ACT inhaler; Inhale 1-2 puffs into  the lungs every 6 (six) hours as needed for wheezing or shortness of breath.  Dispense: 1 Inhaler; Refill: 11 - predniSONE (STERAPRED UNI-PAK 48 TAB) 10 MG (48) TBPK tablet; Take as directed for 12 days  Dispense: 48 tablet; Refill: 0  2. Age related osteoporosis, unspecified pathological fracture presence - alendronate (FOSAMAX) 70 MG tablet; Take 1 tablet (70 mg total) by mouth once a week. Take with a full glass of water on an empty stomach.  Dispense: 4 tablet; Refill: 11  3. Diabetes mellitus without complication ([place]) - CBC with Differential/Platelet - CMP14+EGFR - Lipid panel - Microalbumin / creatinine urine ratio  4. Essential hypertension - CBC with Differential/Platelet - CMP14+EGFR - Lipid panel - Microalbumin / creatinine urine ratio  5. Acute pain of left knee - DG Knee 1-2 Views Left; Future  Daughter-in-law Ileana Roup is the point of contact that we can call and give results for her x-ray and labs.  Her son's name is Iona Beard. She is supposed to get a phone number  Continue all other maintenance medications as listed above.  Follow up plan: Return in about 6 months (around 05/25/2019).  Educational handout given for [place] PA-C [place] [address]  [place], [place] [address] [phone]   11/23/2018, 8:42 AM

## 2018-11-23 LAB — CMP14+EGFR
ALBUMIN: 4 g/dL (ref 3.7–4.7)
ALK PHOS: 48 IU/L (ref 39–117)
ALT: 8 IU/L (ref 0–32)
AST: 17 IU/L (ref 0–40)
Albumin/Globulin Ratio: 1.1 — ABNORMAL LOW (ref 1.2–2.2)
BILIRUBIN TOTAL: 0.7 mg/dL (ref 0.0–1.2)
BUN / CREAT RATIO: 17 (ref 12–28)
BUN: 15 mg/dL (ref 8–27)
CHLORIDE: 98 mmol/L (ref 96–106)
CO2: 27 mmol/L (ref 20–29)
CREATININE: 0.89 mg/dL (ref 0.57–1.00)
Calcium: 9.3 mg/dL (ref 8.7–10.3)
GFR calc Af Amer: 71 mL/min/{1.73_m2} (ref >59)
GFR calc non Af Amer: 62 mL/min/{1.73_m2} (ref >59)
GLUCOSE: 71 mg/dL (ref 65–99)
Globulin, Total: 3.8 g/dL (ref 1.5–4.5)
Potassium: 3.8 mmol/L (ref 3.5–5.2)
SODIUM: 141 mmol/L (ref 134–144)
Total Protein: 7.8 g/dL (ref 6.0–8.5)

## 2018-11-23 LAB — MICROALBUMIN / CREATININE URINE RATIO
Creatinine, Urine: 218.3 mg/dL
Microalb/Creat Ratio: 6 mg/g creat (ref 0–29)
Microalbumin, Urine: 12.4 ug/mL

## 2018-11-23 LAB — LIPID PANEL
CHOLESTEROL TOTAL: 200 mg/dL — AB (ref 100–199)
Chol/HDL Ratio: 2.6 ratio (ref 0.0–4.4)
HDL: 78 mg/dL (ref >39)
LDL CALC: 110 mg/dL — AB (ref 0–99)
TRIGLYCERIDES: 60 mg/dL (ref 0–149)
VLDL Cholesterol Cal: 12 mg/dL (ref 5–40)

## 2018-11-23 LAB — CBC WITH DIFFERENTIAL/PLATELET
Basophils Absolute: 0 10*3/uL (ref 0.0–0.2)
Basos: 0 %
EOS (ABSOLUTE): 0.1 10*3/uL (ref 0.0–0.4)
EOS: 2 %
HEMATOCRIT: 39.8 % (ref 34.0–46.6)
Hemoglobin: 12.6 g/dL (ref 11.1–15.9)
IMMATURE GRANS (ABS): 0 10*3/uL (ref 0.0–0.1)
IMMATURE GRANULOCYTES: 0 %
Lymphocytes Absolute: 1.1 10*3/uL (ref 0.7–3.1)
Lymphs: 30 %
MCH: 26.5 pg — AB (ref 26.6–33.0)
MCHC: 31.7 g/dL (ref 31.5–35.7)
MCV: 84 fL (ref 79–97)
MONOCYTES: 9 %
Monocytes Absolute: 0.4 10*3/uL (ref 0.1–0.9)
Neutrophils Absolute: 2.3 10*3/uL (ref 1.4–7.0)
Neutrophils: 59 %
Platelets: 158 10*3/uL (ref 150–450)
RBC: 4.76 x10E6/uL (ref 3.77–5.28)
RDW: 13 % (ref 11.7–15.4)
WBC: 3.8 10*3/uL (ref 3.4–10.8)

## 2018-11-27 ENCOUNTER — Telehealth: Payer: Self-pay | Admitting: Physician Assistant

## 2018-11-27 NOTE — Telephone Encounter (Signed)
Spoke with Glenard Haring and she is aware and states to make sure patient has her oxygen on all the time and checks her pulse ox.  If patient gets worse she needs to go to ER.  Daughter in law aware and verbalizes understanding.

## 2019-01-29 ENCOUNTER — Ambulatory Visit: Payer: Medicare Other | Admitting: Physician Assistant

## 2019-02-09 ENCOUNTER — Telehealth: Payer: Self-pay | Admitting: Physician Assistant

## 2019-02-16 ENCOUNTER — Telehealth: Payer: Self-pay | Admitting: Physician Assistant

## 2019-02-16 ENCOUNTER — Other Ambulatory Visit: Payer: Self-pay

## 2019-02-16 NOTE — Telephone Encounter (Signed)
PT has apt with Glenard Haring on 6/22 pt reports cough when she eats states that it is acid reflux. Please advise pt if she is able to come in the office to be seen

## 2019-02-16 NOTE — Telephone Encounter (Signed)
Spoke to pt and she states she only coughs when she has something stuck in her throat like when she is eating. Ok to come in office for appt. Pt aware.

## 2019-02-19 ENCOUNTER — Ambulatory Visit (INDEPENDENT_AMBULATORY_CARE_PROVIDER_SITE_OTHER): Payer: Medicare Other | Admitting: Physician Assistant

## 2019-02-19 ENCOUNTER — Encounter: Payer: Self-pay | Admitting: Physician Assistant

## 2019-02-19 DIAGNOSIS — I10 Essential (primary) hypertension: Secondary | ICD-10-CM | POA: Diagnosis not present

## 2019-02-19 DIAGNOSIS — J441 Chronic obstructive pulmonary disease with (acute) exacerbation: Secondary | ICD-10-CM

## 2019-02-19 DIAGNOSIS — E119 Type 2 diabetes mellitus without complications: Secondary | ICD-10-CM

## 2019-02-19 DIAGNOSIS — J449 Chronic obstructive pulmonary disease, unspecified: Secondary | ICD-10-CM

## 2019-02-19 DIAGNOSIS — E876 Hypokalemia: Secondary | ICD-10-CM

## 2019-02-19 MED ORDER — CARVEDILOL 3.125 MG PO TABS: 3.1250 mg | ORAL_TABLET | Freq: Two times a day (BID) | ORAL | 3 refills | Status: DC

## 2019-02-19 MED ORDER — METFORMIN HCL 500 MG PO TABS: 500.0000 mg | ORAL_TABLET | Freq: Every day | ORAL | 1 refills | Status: DC

## 2019-02-19 MED ORDER — ALBUTEROL SULFATE HFA 108 (90 BASE) MCG/ACT IN AERS: 1.0000 | INHALATION_SPRAY | Freq: Four times a day (QID) | RESPIRATORY_TRACT | 12 refills | Status: DC | PRN

## 2019-02-19 MED ORDER — FUROSEMIDE 40 MG PO TABS: 40.0000 mg | ORAL_TABLET | Freq: Every day | ORAL | 11 refills | Status: DC

## 2019-02-19 MED ORDER — POTASSIUM CHLORIDE CRYS ER 20 MEQ PO TBCR: 20.0000 meq | EXTENDED_RELEASE_TABLET | Freq: Every day | ORAL | 11 refills | Status: DC

## 2019-02-19 MED ORDER — ALBUTEROL SULFATE (2.5 MG/3ML) 0.083% IN NEBU: 3.0000 mL | INHALATION_SOLUTION | Freq: Four times a day (QID) | RESPIRATORY_TRACT | 11 refills | Status: DC | PRN

## 2019-02-19 MED ORDER — FLUTICASONE-SALMETEROL 100-50 MCG/DOSE IN AEPB: 1.0000 | INHALATION_SPRAY | Freq: Two times a day (BID) | RESPIRATORY_TRACT | 11 refills | Status: DC

## 2019-02-19 NOTE — Addendum Note (Signed)
Addended by: Thana Ates on: 02/19/2019 05:07 PM   Modules accepted: Orders

## 2019-02-19 NOTE — Progress Notes (Signed)
   BP (!) 177/81   Pulse (!) 48   Temp (!) 96.9 F (36.1 C) (Oral)   Ht 5' 6" (1.676 m)   Wt 173 lb (78.5 kg)   SpO2 (!) 88%   BMI 27.92 kg/m    Subjective:    Patient ID: Sonya Oliver, female    DOB: 11/10/1940, 77 y.o.   MRN: [id number]  HPI: Sonya Oliver is a 77 y.o. female presenting on 02/19/2019 for Hallucinations  This patient is brought in by her daughter today.  She has been having some hallucinations.  They state that she is seeing things that are not present.  This is been going on and off for the last few weeks.  She has never had behavior like this before.  She does have known COPD with history of hypoxia.  She does have oxygen in the home.  She admits to not always taking it.  She also has had diabetes, but she states she has not had any low sugars, most of the time it is in the 100 range.  We have discussed that it is safer for her to be in the 100-200 range rather than ever going to low. She is getting weaker and having difficulty with walking.  Someone has to be around her most of the time to stay safe. She does have a little bit of swelling in her lower legs.  The left one seems to be a little more swollen than the right.  There is a little more redness there.  She denies any fever or chills.  She also denies having any cough congestion or productive cough.  She has had a history of COPD, but seems to be very clear at this time.  We would like to evaluate for urinary tract infection.  They are going to bring a urine back at their next convenient time.  In the meantime I am going to use some Keflex to help treat a little bit of cellulitis in the leg and hopefully treat any infection that may be going in the bladder.  I do think that Sonya Oliver is a good candidate for home health evaluation, nursing, OT, PT would probably all be quite beneficial.  And even the possibility of a daily aid.  There is a family history of her mother having dementia.  Patient has also had  a stroke in the past.  Past Medical History:  Diagnosis Date  . Arthritis   . Bladder tumor   . Cancer (HCC) 2015   bladder  . Cataract   . CHF (congestive heart failure) (HCC)    NO CARDIOLOGIST---  MONITORED BY PCP DR WONG  . COPD with emphysema (HCC)    last Acute Exacerbation 08-21-2014  . Dyspnea on exertion   . H/O pulmonary edema    jan 2014--  acute CHF w/ pulmonary edema  . Hypertension   . Hyperthyroidism    currently no meds -- labs stable  . Nocturia   . Nocturnal oxygen desaturation    uses O2  HS via [place]--  and PRN daytime  . Osteoporosis   . Stroke (HCC)   . Type 2 diabetes mellitus (HCC)   . Ureteral tumor    Relevant past medical, surgical, family and social history reviewed and updated as indicated. Interim medical history since our last visit reviewed. Allergies and medications reviewed and updated. DATA REVIEWED: CHART IN EPIC  Family History reviewed for pertinent findings.  Review of Systems    Constitutional: Positive for fatigue. Negative for activity change and fever.  HENT: Negative.   Eyes: Negative.   Respiratory: Positive for shortness of breath. Negative for cough and wheezing.   Cardiovascular: Positive for leg swelling. Negative for chest pain and palpitations.  Gastrointestinal: Negative.  Negative for abdominal pain.  Endocrine: Negative.   Genitourinary: Positive for frequency and urgency. Negative for dysuria.  Musculoskeletal: Negative.   Skin: Negative.   Neurological: Negative.  Negative for seizures, syncope, light-headedness and headaches.  Psychiatric/Behavioral: Positive for hallucinations.    Allergies as of 02/19/2019   No Known Allergies     Medication List       Accurate as of February 19, 2019  4:47 PM. If you have any questions, ask your nurse or doctor.        STOP taking these medications   predniSONE 10 MG (48) Tbpk tablet Commonly known as: STERAPRED UNI-PAK 48 TAB Stopped by:  S , PA-C     TAKE  these medications   albuterol (2.5 MG/3ML) 0.083% nebulizer solution Commonly known as: PROVENTIL Inhale 3 mLs into the lungs 4 (four) times daily as needed for shortness of breath.   albuterol 108 (90 Base) MCG/ACT inhaler Commonly known as: VENTOLIN HFA Inhale 1-2 puffs into the lungs every 6 (six) hours as needed for wheezing or shortness of breath.   alendronate 70 MG tablet Commonly known as: Fosamax Take 1 tablet (70 mg total) by mouth once a week. Take with a full glass of water on an empty stomach.   carvedilol 3.125 MG tablet Commonly known as: COREG Take 1 tablet (3.125 mg total) by mouth 2 (two) times daily.   cycloSPORINE 0.05 % ophthalmic emulsion Commonly known as: Restasis MultiDose Place 1 drop into both eyes 2 (two) times daily.   Fluticasone-Salmeterol 100-50 MCG/DOSE Aepb Commonly known as: Advair Diskus Inhale 1 puff into the lungs 2 (two) times daily.   furosemide 40 MG tablet Commonly known as: LASIX Take 1 tablet (40 mg total) by mouth daily. TAKE 1 TABLET BY MOUTH ONCE DAILY   metFORMIN 500 MG tablet Commonly known as: GLUCOPHAGE Take 1 tablet (500 mg total) by mouth daily.   potassium chloride SA 20 MEQ tablet Commonly known as: K-DUR Take 1 tablet (20 mEq total) by mouth daily.   Vitamin D3 50 MCG (2000 UT) Tabs Take 1 tablet by mouth daily.          Objective:    BP (!) 177/81   Pulse (!) 48   Temp (!) 96.9 F (36.1 C) (Oral)   Ht 5' 6" (1.676 m)   Wt 173 lb (78.5 kg)   SpO2 (!) 88%   BMI 27.92 kg/m   No Known Allergies  Wt Readings from Last 3 Encounters:  02/19/19 173 lb (78.5 kg)  11/22/18 175 lb 6.4 oz (79.6 kg)  05/24/18 190 lb (86.2 kg)    Physical Exam Constitutional:      Appearance: She is well-developed.  HENT:     Head: Normocephalic and atraumatic.  Eyes:     Conjunctiva/sclera: Conjunctivae normal.     Pupils: Pupils are equal, round, and reactive to light.  Cardiovascular:     Rate and Rhythm: Normal rate  and regular rhythm.     Heart sounds: Normal heart sounds.  Pulmonary:     Effort: Pulmonary effort is normal.     Breath sounds: Normal breath sounds.  Abdominal:     General: Bowel sounds are normal.       Palpations: Abdomen is soft.  Musculoskeletal:     Lumbar back: She exhibits decreased range of motion and tenderness.  Skin:    General: Skin is warm and dry.     Findings: No rash.  Neurological:     Mental Status: She is alert and oriented to person, place, and time.     Motor: Weakness present.     Deep Tendon Reflexes: Reflexes are normal and symmetric.  Psychiatric:        Mood and Affect: Mood normal.        Behavior: Behavior normal.        Thought Content: Thought content normal.        Cognition and Memory: Memory is impaired.        Judgment: Judgment normal.     Results for orders placed or performed in visit on 11/22/18  CBC with Differential/Platelet  Result Value Ref Range   WBC 3.8 3.4 - 10.8 x10E3/uL   RBC 4.76 3.77 - 5.28 x10E6/uL   Hemoglobin 12.6 11.1 - 15.9 g/dL   Hematocrit 39.8 34.0 - 46.6 %   MCV 84 79 - 97 fL   MCH 26.5 (L) 26.6 - 33.0 pg   MCHC 31.7 31.5 - 35.7 g/dL   RDW 13.0 11.7 - 15.4 %   Platelets 158 150 - 450 x10E3/uL   Neutrophils 59 Not Estab. %   Lymphs 30 Not Estab. %   Monocytes 9 Not Estab. %   Eos 2 Not Estab. %   Basos 0 Not Estab. %   Neutrophils Absolute 2.3 1.4 - 7.0 x10E3/uL   Lymphocytes Absolute 1.1 0.7 - 3.1 x10E3/uL   Monocytes Absolute 0.4 0.1 - 0.9 x10E3/uL   EOS (ABSOLUTE) 0.1 0.0 - 0.4 x10E3/uL   Basophils Absolute 0.0 0.0 - 0.2 x10E3/uL   Immature Granulocytes 0 Not Estab. %   Immature Grans (Abs) 0.0 0.0 - 0.1 x10E3/uL  CMP14+EGFR  Result Value Ref Range   Glucose 71 65 - 99 mg/dL   BUN 15 8 - 27 mg/dL   Creatinine, Ser 0.89 0.57 - 1.00 mg/dL   GFR calc non Af Amer 62 >59 mL/min/1.73   GFR calc Af Amer 71 >59 mL/min/1.73   BUN/Creatinine Ratio 17 12 - 28   Sodium 141 134 - 144 mmol/L   Potassium 3.8  3.5 - 5.2 mmol/L   Chloride 98 96 - 106 mmol/L   CO2 27 20 - 29 mmol/L   Calcium 9.3 8.7 - 10.3 mg/dL   Total Protein 7.8 6.0 - 8.5 g/dL   Albumin 4.0 3.7 - 4.7 g/dL   Globulin, Total 3.8 1.5 - 4.5 g/dL   Albumin/Globulin Ratio 1.1 (L) 1.2 - 2.2   Bilirubin Total 0.7 0.0 - 1.2 mg/dL   Alkaline Phosphatase 48 39 - 117 IU/L   AST 17 0 - 40 IU/L   ALT 8 0 - 32 IU/L  Lipid panel  Result Value Ref Range   Cholesterol, Total 200 (H) 100 - 199 mg/dL   Triglycerides 60 0 - 149 mg/dL   HDL 78 >39 mg/dL   VLDL Cholesterol Cal 12 5 - 40 mg/dL   LDL Calculated 110 (H) 0 - 99 mg/dL   Chol/HDL Ratio 2.6 0.0 - 4.4 ratio  Microalbumin / creatinine urine ratio  Result Value Ref Range   Creatinine, Urine 218.3 Not Estab. mg/dL   Microalbumin, Urine 12.4 Not Estab. ug/mL   Microalb/Creat Ratio 6 0 - 29 mg/g creat        Assessment & Plan:   1. Chronic obstructive pulmonary disease with acute exacerbation (HCC) Home health referral - albuterol (PROVENTIL) (2.5 MG/3ML) 0.083% nebulizer solution; Inhale 3 mLs into the lungs 4 (four) times daily as needed for shortness of breath.  Dispense: 240 mL; Refill: 11 - albuterol (VENTOLIN HFA) 108 (90 Base) MCG/ACT inhaler; Inhale 1-2 puffs into the lungs every 6 (six) hours as needed for wheezing or shortness of breath.  Dispense: 18 g; Refill: 12 - Fluticasone-Salmeterol (ADVAIR DISKUS) 100-50 MCG/DOSE AEPB; Inhale 1 puff into the lungs 2 (two) times daily.  Dispense: 60 each; Refill: 11  2. Chronic obstructive pulmonary disease, unspecified COPD type (HCC) Home health referral - albuterol (PROVENTIL) (2.5 MG/3ML) 0.083% nebulizer solution; Inhale 3 mLs into the lungs 4 (four) times daily as needed for shortness of breath.  Dispense: 240 mL; Refill: 11  3. Essential hypertension Home health referral - carvedilol (COREG) 3.125 MG tablet; Take 1 tablet (3.125 mg total) by mouth 2 (two) times daily.  Dispense: 180 tablet; Refill: 3 - furosemide (LASIX) 40 MG  tablet; Take 1 tablet (40 mg total) by mouth daily. TAKE 1 TABLET BY MOUTH ONCE DAILY  Dispense: 30 tablet; Refill: 11  4. Diabetes mellitus without complication (HCC) Home health referral - metFORMIN (GLUCOPHAGE) 500 MG tablet; Take 1 tablet (500 mg total) by mouth daily.  Dispense: 90 tablet; Refill: 1  5. Hypokalemia Home health referral - potassium chloride SA (K-DUR) 20 MEQ tablet; Take 1 tablet (20 mEq total) by mouth daily.  Dispense: 30 tablet; Refill: 11  This patient is also some high risk of falls, possible medication confusion, not using oxygen at all times.  I do think home health referral would be very beneficial.  Continue all other maintenance medications as listed above.  Follow up plan: Return in about 4 weeks (around 03/19/2019).  Educational handout given for survey   S.  PA-C [facility] [address]  [place], [place] [address] [phone]   02/19/2019, 4:47 PM  

## 2019-02-20 ENCOUNTER — Other Ambulatory Visit: Payer: Self-pay

## 2019-02-20 ENCOUNTER — Other Ambulatory Visit: Payer: Medicare Other

## 2019-02-20 ENCOUNTER — Other Ambulatory Visit: Payer: Self-pay | Admitting: Physician Assistant

## 2019-02-20 DIAGNOSIS — R300 Dysuria: Secondary | ICD-10-CM

## 2019-02-20 DIAGNOSIS — R410 Disorientation, unspecified: Secondary | ICD-10-CM

## 2019-02-20 LAB — URINALYSIS, COMPLETE
Bilirubin, UA: NEGATIVE
Glucose, UA: NEGATIVE
Leukocytes,UA: NEGATIVE
Nitrite, UA: NEGATIVE
Protein,UA: NEGATIVE
RBC, UA: NEGATIVE
Specific Gravity, UA: 1.025 (ref 1.005–1.030)
Urobilinogen, Ur: 1 mg/dL (ref 0.2–1.0)
pH, UA: 5.5 (ref 5.0–7.5)

## 2019-02-20 LAB — MICROSCOPIC EXAMINATION: Renal Epithel, UA: NONE SEEN /HPF

## 2019-02-21 LAB — URINE CULTURE

## 2019-02-22 ENCOUNTER — Telehealth: Payer: Self-pay | Admitting: *Deleted

## 2019-02-22 NOTE — Telephone Encounter (Signed)
  [name] [phone]    Martin,Nancy Daughter [phone]   Address:  Email:     Legal guardian?     Health Care Agent:   [employer] Active?  Hearing impaired?   Visually impaired?  Hearing-Visual needs:   Spoken language:  Written language:   Preferred language:  Interpreter needed?   Special needs:  Notify on admission?    Bernadene Bell   [phone]   Address:  Email:     Legal guardian?     Health Care Agent:   [employer] Active?  Hearing impaired?   Visually impaired?  Hearing-Visual needs:   Spoken language:  Written language:   Preferred language:  Interpreter needed?   Special needs:  Notify on admission?    [facility] Daughter [phone]

## 2019-02-22 NOTE — Telephone Encounter (Signed)
Can you reach out to her daughter that was here?

## 2019-02-22 NOTE — Telephone Encounter (Signed)
Sonya Oliver called and needs a "new start of care date" Please give verbal that it will be ok to start care as of 02/26/19.  They have not been able to reach pt as of 02/22/19. Have LM and tried 2 different numbers.

## 2019-02-23 NOTE — Telephone Encounter (Signed)
Melissa aware.

## 2019-02-27 ENCOUNTER — Telehealth: Payer: Self-pay | Admitting: *Deleted

## 2019-02-27 NOTE — Telephone Encounter (Signed)
Angela from [facility] called - to discuss pt - LM for her to call back on WED or today before 5 pm  - jHB

## 2019-03-01 ENCOUNTER — Telehealth: Payer: Self-pay | Admitting: Physician Assistant

## 2019-03-01 ENCOUNTER — Encounter: Payer: Self-pay | Admitting: *Deleted

## 2019-03-01 ENCOUNTER — Other Ambulatory Visit: Payer: Self-pay

## 2019-03-01 ENCOUNTER — Ambulatory Visit (INDEPENDENT_AMBULATORY_CARE_PROVIDER_SITE_OTHER): Payer: Medicare Other | Admitting: *Deleted

## 2019-03-01 DIAGNOSIS — Z0000 Encounter for general adult medical examination without abnormal findings: Secondary | ICD-10-CM

## 2019-03-01 NOTE — Telephone Encounter (Signed)
Yes you can give the verbal order to change this to the frequency noted above

## 2019-03-01 NOTE — Progress Notes (Signed)
MEDICARE ANNUAL WELLNESS VISIT  03/01/2019  Telephone Visit Disclaimer This Medicare AWV was conducted by telephone due to national recommendations for restrictions regarding the COVID-19 Pandemic (e.g. social distancing).  I verified, using two identifiers, that I am speaking with Sonya Oliver or their authorized healthcare agent. I discussed the limitations, risks, security, and privacy concerns of performing an evaluation and management service by telephone and the potential availability of an in-person appointment in the future. The patient expressed understanding and agreed to proceed.   Subjective:  Sonya Oliver is a 78 y.o. female patient of Sonya Sleeper, PA-C who had a Medicare Annual Wellness Visit today via telephone. Sonya Oliver is Retired and lives with their family. she has 4 children. she reports that she is socially active and does interact with friends/family regularly. she is minimally physically active and enjoys doing word puzzles.  Patient Care Team: Theodoro Clock as PCP - General (Physician Assistant) Kathie Rhodes, MD as Consulting Physician (Urology) Steffanie Rainwater, DPM as Consulting Physician (Podiatry) Melina Schools, OD as Consulting Physician (Optometry) Sinda Du, MD as Consulting Physician (Pulmonary Disease) Jacelyn Pi, MD as Consulting Physician (Endocrinology)  Advanced Directives 03/01/2019 02/22/2018 12/03/2016 09/29/2016 09/28/2016 07/08/2016 02/07/2016  Does Patient Have a Medical Advance Directive? No Yes No Yes No Yes No  Type of Advance Directive - Healthcare Power of [place]  Does patient want to make changes to medical advance directive? - No - Patient declined - No - Patient declined - No - Patient declined -  Copy of [place] in Chart? - No - copy requested - - - No - copy requested No - copy requested  Would patient like  information on creating a medical advance directive? No - Patient declined - No - Patient declined - - - -  Pre-existing out of facility DNR order (yellow form or pink MOST form) - - - - - - -    Hospital Utilization Over the Past 12 Months: # of hospitalizations or ER visits: 0 # of surgeries: 0  Review of Systems    Patient reports that her overall health is unchanged compared to last year.  Patient Reported Readings (BP, Pulse, CBG, Weight, etc) none  Review of Systems: No complaints  All other systems negative.  Pain Assessment Pain : No/denies pain     Current Medications & Allergies (verified) Allergies as of 03/01/2019   No Known Allergies     Medication List       Accurate as of March 01, 2019  2:27 PM. If you have any questions, ask your nurse or doctor.        albuterol (2.5 MG/3ML) 0.083% nebulizer solution Commonly known as: PROVENTIL Inhale 3 mLs into the lungs 4 (four) times daily as needed for shortness of breath.   albuterol 108 (90 Base) MCG/ACT inhaler Commonly known as: VENTOLIN HFA Inhale 1-2 puffs into the lungs every 6 (six) hours as needed for wheezing or shortness of breath.   alendronate 70 MG tablet Commonly known as: Fosamax Take 1 tablet (70 mg total) by mouth once a week. Take with a full glass of water on an empty stomach.   carvedilol 3.125 MG tablet Commonly known as: COREG Take 1 tablet (3.125 mg total) by mouth 2 (two) times daily.   cycloSPORINE 0.05 % ophthalmic emulsion Commonly known as: Restasis MultiDose Place 1 drop into  both eyes 2 (two) times daily.   Fluticasone-Salmeterol 100-50 MCG/DOSE Aepb Commonly known as: Advair Diskus Inhale 1 puff into the lungs 2 (two) times daily.   furosemide 40 MG tablet Commonly known as: LASIX Take 1 tablet (40 mg total) by mouth daily. TAKE 1 TABLET BY MOUTH ONCE DAILY   metFORMIN 500 MG tablet Commonly known as: GLUCOPHAGE Take 1 tablet (500 mg total) by mouth daily.    potassium chloride SA 20 MEQ tablet Commonly known as: K-DUR Take 1 tablet (20 mEq total) by mouth daily.   Vitamin D3 50 MCG (2000 UT) Tabs Take 1 tablet by mouth daily.       History (reviewed): Past Medical History:  Diagnosis Date  . Arthritis   . Bladder tumor   . Cancer ([place]) 2015   bladder  . Cataract   . CHF (congestive heart failure) ([place])    NO CARDIOLOGIST---  MONITORED BY PCP DR Jacelyn Grip  . COPD with emphysema ([place])    last Acute Exacerbation 08-21-2014  . Dyspnea on exertion   . H/O pulmonary edema    jan 2014--  acute CHF w/ pulmonary edema  . Hypertension   . Hyperthyroidism    currently no meds -- labs stable  . Nocturia   . Nocturnal oxygen desaturation    uses O2  HS via [place]--  and PRN daytime  . Osteoporosis   . Stroke ([place])   . Type 2 diabetes mellitus ([place])   . Ureteral tumor    Past Surgical History:  Procedure Laterality Date  . ABDOMINAL HYSTERECTOMY  1980'S   W/ BILATERAL SALPINGO-OOPHORECTOMY  . CATARACT EXTRACTION W/PHACO Left 01/31/2014   Procedure: CATARACT EXTRACTION PHACO AND INTRAOCULAR LENS PLACEMENT (IOC);  Surgeon: Tonny Branch, MD;  Location: [facility];  Service: Ophthalmology;  Laterality: Left;  CDE: 23.78  . CATARACT EXTRACTION W/PHACO Right 02/28/2014   Procedure: CATARACT EXTRACTION PHACO AND INTRAOCULAR LENS PLACEMENT (IOC);  Surgeon: Tonny Branch, MD;  Location: [facility];  Service: Ophthalmology;  Laterality: Right;  CDE:  11.34  . CYSTOSCOPY  03/23/2012   Procedure: CYSTOSCOPY;  Surgeon: Claybon Jabs, MD;  Location: [facility];  Service: Urology;  Laterality: N/A;  . CYSTOSCOPY WITH RETROGRADE PYELOGRAM, URETEROSCOPY AND STENT PLACEMENT Right 10/21/2014   Procedure: CYSTOSCOPY WITH RETROGRADE PYELOGRAM, URETEROSCOPY, BLADDER TUMOR BIOPSY;  Surgeon: Claybon Jabs, MD;  Location: [facility];  Service: Urology;  Laterality: Right;  . TRANSTHORACIC ECHOCARDIOGRAM  06-21-2013   moderate LVH,  grade I diastolic dysfunction, ef 40-08%,   mild MR & TR  . TRANSURETHRAL RESECTION OF BLADDER TUMOR  03/23/2012   Procedure: TRANSURETHRAL RESECTION OF BLADDER TUMOR (TURBT);  Surgeon: Claybon Jabs, MD;  Location: [facility];  Service: Urology;  Laterality: N/A;  . TRANSURETHRAL RESECTION OF BLADDER TUMOR N/A 01/08/2013   Procedure: TRANSURETHRAL RESECTION OF BLADDER TUMOR (TURBT);  Surgeon: Claybon Jabs, MD;  [facility];  Service: Urology;  Laterality: N/A;   Family History  Problem Relation Age of Onset  . Diabetes Father   . Congestive Heart Failure Father   . COPD Father   . Kidney disease Father   . Hypertension Father   . Diabetes Mother        with kidney disease  . Kidney disease Mother   . Heart attack Brother   . Asthma Brother   . Diabetes Daughter   . Diabetes Son   . Cancer Brother        throat  .  Hypertension Brother    Social History   Socioeconomic History  . Marital status: Widowed    Spouse name: Not on file  . Number of children: 4  . Years of education: 10  . Highest education level: 10th grade  Occupational History  . Occupation: Retired Scientist, clinical (histocompatibility and immunogenetics)  . Financial resource strain: Not hard at all  . Food insecurity    Worry: Never true    Inability: Never true  . Transportation needs    Medical: No    Non-medical: No  Tobacco Use  . Smoking status: Former Smoker    Packs/day: 0.25    Years: 50.00    Pack years: 12.50    Types: Cigarettes    Quit date: 09/24/2006    Years since quitting: 12.4  . Smokeless tobacco: Never Used  Substance and Sexual Activity  . Alcohol use: No  . Drug use: No  . Sexual activity: Not Currently  Lifestyle  . Physical activity    Days per week: 7 days    Minutes per session: 10 min  . Stress: Not at all  Relationships  . Social connections    Talks on phone: More than three times a week    Gets together: More than three times a week    Attends religious service: More than 4 times per year    Active member  of club or organization: Yes    Attends meetings of clubs or organizations: More than 4 times per year    Relationship status: Widowed  Other Topics Concern  . Not on file  Social History Narrative   Occupation: worked in [place] yrs    Activities of Daily Living In your present state of health, do you have any difficulty performing the following activities: 03/01/2019  Hearing? Y  Comment pt has to have people repeat themselves  Vision? N  Difficulty concentrating or making decisions? N  Walking or climbing stairs? Y  Comment balance is off a little bit  Dressing or bathing? N  Doing errands, shopping? Y  Comment daughter does all the errands and grocery shopping and brings pt to all appointments  Preparing Food and eating ? N  Using the Toilet? N  In the past six months, have you accidently leaked urine? Y  Comment wears depends all the time  Do you have problems with loss of bowel control? N  Managing your Medications? Y  Comment daughter takes care of her medications  Managing your Finances? N  Housekeeping or managing your Housekeeping? Y  Comment daughter does most of the housework  Some recent data might be hidden    Patient Literacy How often do you need to have someone help you when you read instructions, pamphlets, or other written materials from your doctor or pharmacy?: 1 - Never What is the last grade level you completed in school?: 10th grade  Exercise Current Exercise Habits: Home exercise routine, Type of exercise: Other - see comments(physical therapy), Time (Minutes): 10, Frequency (Times/Week): 7, Weekly Exercise (Minutes/Week): 70, Intensity: Mild, Exercise limited by: cardiac condition(s)  Diet Patient reports consuming 1 meals a day and 2 snack(s) a day Patient reports that her primary diet is: Regular Patient reports that she does have regular access to food.   Depression Screen PHQ 2/9 Scores 03/01/2019 02/19/2019 11/22/2018 05/24/2018  02/22/2018 11/23/2017 05/25/2017  PHQ - 2 Score 1 0 0 3 3 1  0  PHQ- 9 Score - - - 7 4 - -  Fall Risk Fall Risk  03/01/2019 02/19/2019 11/22/2018 05/24/2018 02/22/2018  Falls in the past year? 0 0 0 No No  Comment - - - - -  Number falls in past yr: - - - - -  Injury with Fall? - - - - -  Risk for fall due to : - - - - -  Risk for fall due to: Comment - - - - -  Follow up - - - - -     Objective:  Sonya Oliver seemed alert and oriented and she participated appropriately during our telephone visit.  Blood Pressure Weight BMI  BP Readings from Last 3 Encounters:  02/19/19 (!) 177/81  11/22/18 (!) 159/87  05/24/18 (!) 157/66   Wt Readings from Last 3 Encounters:  02/19/19 173 lb (78.5 kg)  11/22/18 175 lb 6.4 oz (79.6 kg)  05/24/18 190 lb (86.2 kg)   BMI Readings from Last 1 Encounters:  02/19/19 27.92 kg/m    *Unable to obtain current vital signs, weight, and BMI due to telephone visit type  Hearing/Vision  . Sonya Oliver did not seem to have difficulty with hearing/understanding during the telephone conversation . Reports that she has not had a formal eye exam by an eye care professional within the past year . Reports that she has not had a formal hearing evaluation within the past year *Unable to fully assess hearing and vision during telephone visit type  Cognitive Function: 6CIT Screen 03/01/2019  What Year? 0 points  What month? 0 points  What time? 0 points  Count back from 20 0 points  Months in reverse 2 points  Repeat phrase 2 points  Total Score 4   (Normal:0-7, Significant for Dysfunction: >8)  Normal Cognitive Function Screening: Yes   Immunization & Health Maintenance Record Immunization History  Administered Date(s) Administered  . Influenza, High Dose Seasonal PF 05/25/2017, 05/24/2018  . Influenza,inj,Quad PF,6+ Mos 06/20/2013, 05/30/2014, 06/09/2015, 06/10/2016  . Pneumococcal Conjugate-13 03/07/2015  . Pneumococcal Polysaccharide-23 04/26/2011  . Td  04/26/2011  . Tdap 04/26/2011  . Zoster 04/16/2013    Health Maintenance  Topic Date Due  . FOOT EXAM  07/08/2017  . MAMMOGRAM  09/29/2017  . HEMOGLOBIN A1C  11/22/2018  . OPHTHALMOLOGY EXAM  12/08/2018  . INFLUENZA VACCINE  03/31/2019  . URINE MICROALBUMIN  11/22/2019  . DEXA SCAN  02/23/2020  . TETANUS/TDAP  04/25/2021  . PNA vac Low Risk Adult  Completed       Assessment  This is a routine wellness examination for Sonya Oliver.  Health Maintenance: Due or Overdue Health Maintenance Due  Topic Date Due  . FOOT EXAM  07/08/2017  . MAMMOGRAM  09/29/2017  . HEMOGLOBIN A1C  11/22/2018  . OPHTHALMOLOGY EXAM  12/08/2018    Sonya Oliver does not need a referral for Community Assistance: Care Management:   no Social Work:    no Prescription Assistance:  no Nutrition/Diabetes Education:  no   Plan:  Personalized Goals Goals Addressed            This Visit's Progress   . DIET - INCREASE WATER INTAKE       Try to drink 6-8 glasses of water daily.      Personalized Health Maintenance & Screening Recommendations  Screening Mammogram  Lung Cancer Screening Recommended: no (Low Dose CT Chest recommended if Age 28-80 years, 30 pack-year currently smoking OR have quit w/in past 15 years) Hepatitis C Screening recommended: no HIV Screening recommended: no  Advanced Directives: Written information was not prepared per patient's request.  Referrals & Orders No orders of the defined types were placed in this encounter.   Follow-up Plan . Follow-up with Sonya Sleeper, PA-C as planned . Schedule your screening mammogram   I have personally reviewed and noted the following in the patient's chart:   . Medical and social history . Use of alcohol, tobacco or illicit drugs  . Current medications and supplements . Functional ability and status . Nutritional status . Physical activity . Advanced directives . List of other physicians . Hospitalizations,  surgeries, and ER visits in previous 12 months . Vitals . Screenings to include cognitive, depression, and falls . Referrals and appointments  In addition, I have reviewed and discussed with Sonya Oliver certain preventive protocols, quality metrics, and best practice recommendations. A written personalized care plan for preventive services as well as general preventive health recommendations is available and can be mailed to the patient at her request.      Marylin Crosby, LPN  [phone]/11/[phone]

## 2019-03-01 NOTE — Patient Instructions (Signed)
Preventive Care 38 Years and Older, Female Preventive care refers to lifestyle choices and visits with your health care provider that can promote health and wellness. This includes:  A yearly physical exam. This is also called an annual well check.  Regular dental and eye exams.  Immunizations.  Screening for certain conditions.  Healthy lifestyle choices, such as diet and exercise. What can I expect for my preventive care visit? Physical exam Your health care provider will check:  Height and weight. These may be used to calculate body mass index (BMI), which is a measurement that tells if you are at a healthy weight.  Heart rate and blood pressure.  Your skin for abnormal spots. Counseling Your health care provider may ask you questions about:  Alcohol, tobacco, and drug use.  Emotional well-being.  Home and relationship well-being.  Sexual activity.  Eating habits.  History of falls.  Memory and ability to understand (cognition).  Work and work Statistician.  Pregnancy and menstrual history. What immunizations do I need?  Influenza (flu) vaccine  This is recommended every year. Tetanus, diphtheria, and pertussis (Tdap) vaccine  You may need a Td booster every 10 years. Varicella (chickenpox) vaccine  You may need this vaccine if you have not already been vaccinated. Zoster (shingles) vaccine  You may need this after age 33. Pneumococcal conjugate (PCV13) vaccine  One dose is recommended after age 33. Pneumococcal polysaccharide (PPSV23) vaccine  One dose is recommended after age 72. Measles, mumps, and rubella (MMR) vaccine  You may need at least one dose of MMR if you were born in 1957 or later. You may also need a second dose. Meningococcal conjugate (MenACWY) vaccine  You may need this if you have certain conditions. Hepatitis A vaccine  You may need this if you have certain conditions or if you travel or work in places where you may be exposed  to hepatitis A. Hepatitis B vaccine  You may need this if you have certain conditions or if you travel or work in places where you may be exposed to hepatitis B. Haemophilus influenzae type b (Hib) vaccine  You may need this if you have certain conditions. You may receive vaccines as individual doses or as more than one vaccine together in one shot (combination vaccines). Talk with your health care provider about the risks and benefits of combination vaccines. What tests do I need? Blood tests  Lipid and cholesterol levels. These may be checked every 5 years, or more frequently depending on your overall health.  Hepatitis C test.  Hepatitis B test. Screening  Lung cancer screening. You may have this screening every year starting at age 39 if you have a 30-pack-year history of smoking and currently smoke or have quit within the past 15 years.  Colorectal cancer screening. All adults should have this screening starting at age 36 and continuing until age 15. Your health care provider may recommend screening at age 23 if you are at increased risk. You will have tests every 1-10 years, depending on your results and the type of screening test.  Diabetes screening. This is done by checking your blood sugar (glucose) after you have not eaten for a while (fasting). You may have this done every 1-3 years.  Mammogram. This may be done every 1-2 years. Talk with your health care provider about how often you should have regular mammograms.  BRCA-related cancer screening. This may be done if you have a family history of breast, ovarian, tubal, or peritoneal cancers.  Other tests  Sexually transmitted disease (STD) testing.  Bone density scan. This is done to screen for osteoporosis. You may have this done starting at age 76. Follow these instructions at home: Eating and drinking  Eat a diet that includes fresh fruits and vegetables, whole grains, lean protein, and low-fat dairy products. Limit  your intake of foods with high amounts of sugar, saturated fats, and salt.  Take vitamin and mineral supplements as recommended by your health care provider.  Do not drink alcohol if your health care provider tells you not to drink.  If you drink alcohol: ? Limit how much you have to 0-1 drink a day. ? Be aware of how much alcohol is in your drink. In the [place] one drink equals one 12 oz bottle of beer (355 mL), one 5 oz glass of wine (148 mL), or one 1 oz glass of hard liquor (44 mL). Lifestyle  Take daily care of your teeth and gums.  Stay active. Exercise for at least 30 minutes on 5 or more days each week.  Do not use any products that contain nicotine or tobacco, such as cigarettes, e-cigarettes, and chewing tobacco. If you need help quitting, ask your health care provider.  If you are sexually active, practice safe sex. Use a condom or other form of protection in order to prevent STIs (sexually transmitted infections).  Talk with your health care provider about taking a low-dose aspirin or statin. What's next?  Go to your health care provider once a year for a well check visit.  Ask your health care provider how often you should have your eyes and teeth checked.  Stay up to date on all vaccines. This information is not intended to replace advice given to you by your health care provider. Make sure you discuss any questions you have with your health care provider. Document Released: 09/12/2015 Document Revised: 08/10/2018 Document Reviewed: 08/10/2018 Elsevier Patient Education  2020 [employer].

## 2019-03-02 NOTE — Telephone Encounter (Signed)
[facility] aware of verbal orders

## 2019-03-06 ENCOUNTER — Other Ambulatory Visit: Payer: Self-pay

## 2019-03-06 ENCOUNTER — Ambulatory Visit (INDEPENDENT_AMBULATORY_CARE_PROVIDER_SITE_OTHER): Payer: Medicare Other

## 2019-03-06 DIAGNOSIS — E119 Type 2 diabetes mellitus without complications: Secondary | ICD-10-CM | POA: Diagnosis not present

## 2019-03-06 DIAGNOSIS — Z8551 Personal history of malignant neoplasm of bladder: Secondary | ICD-10-CM

## 2019-03-06 DIAGNOSIS — Z7984 Long term (current) use of oral hypoglycemic drugs: Secondary | ICD-10-CM

## 2019-03-06 DIAGNOSIS — M1990 Unspecified osteoarthritis, unspecified site: Secondary | ICD-10-CM

## 2019-03-06 DIAGNOSIS — J441 Chronic obstructive pulmonary disease with (acute) exacerbation: Secondary | ICD-10-CM

## 2019-03-06 DIAGNOSIS — J439 Emphysema, unspecified: Secondary | ICD-10-CM

## 2019-03-06 DIAGNOSIS — Z8673 Personal history of transient ischemic attack (TIA), and cerebral infarction without residual deficits: Secondary | ICD-10-CM

## 2019-03-06 DIAGNOSIS — I509 Heart failure, unspecified: Secondary | ICD-10-CM

## 2019-03-06 DIAGNOSIS — I110 Hypertensive heart disease with heart failure: Secondary | ICD-10-CM | POA: Diagnosis not present

## 2019-03-06 DIAGNOSIS — H269 Unspecified cataract: Secondary | ICD-10-CM

## 2019-03-06 DIAGNOSIS — M810 Age-related osteoporosis without current pathological fracture: Secondary | ICD-10-CM

## 2019-03-06 DIAGNOSIS — Z9981 Dependence on supplemental oxygen: Secondary | ICD-10-CM

## 2019-03-06 DIAGNOSIS — E0590 Thyrotoxicosis, unspecified without thyrotoxic crisis or storm: Secondary | ICD-10-CM

## 2019-03-08 ENCOUNTER — Telehealth: Payer: Self-pay | Admitting: *Deleted

## 2019-03-08 NOTE — Telephone Encounter (Signed)
TC from [place] w/ [facility] Seeing patient, her O2 was down to 30, but once nurse got her oxygen on her it came up to 55, there is still some confusion Gave verbal ok to urinalysis

## 2019-03-09 NOTE — Telephone Encounter (Signed)
Noted  

## 2019-03-09 NOTE — Telephone Encounter (Signed)
Detailed message left for daughter that urine dip is normal and we are waiting on urine culture.

## 2019-03-13 DIAGNOSIS — Z029 Encounter for administrative examinations, unspecified: Secondary | ICD-10-CM

## 2019-03-14 ENCOUNTER — Telehealth: Payer: Self-pay | Admitting: Physician Assistant

## 2019-03-14 ENCOUNTER — Other Ambulatory Visit: Payer: Self-pay | Admitting: Physician Assistant

## 2019-03-14 MED ORDER — CEPHALEXIN 250 MG PO CAPS: 250.0000 mg | ORAL_CAPSULE | Freq: Three times a day (TID) | ORAL | 0 refills | Status: DC

## 2019-03-14 NOTE — Telephone Encounter (Signed)
Patient aware.

## 2019-03-14 NOTE — Progress Notes (Signed)
Patient daughter aware. °

## 2019-03-14 NOTE — Progress Notes (Signed)
Lmtcb/ww 07/15

## 2019-03-14 NOTE — Progress Notes (Signed)
Urine culture was ordered through [place] and has just gotten to my desk today the urine was positive for infection so Keflex has been sent to the pharmacy in [place].  They can plan to follow-up if any symptoms remain.

## 2019-03-14 NOTE — Telephone Encounter (Signed)
Patient daughter aware of results

## 2019-03-16 ENCOUNTER — Other Ambulatory Visit: Payer: Self-pay

## 2019-03-19 ENCOUNTER — Other Ambulatory Visit: Payer: Self-pay

## 2019-03-19 ENCOUNTER — Ambulatory Visit (INDEPENDENT_AMBULATORY_CARE_PROVIDER_SITE_OTHER): Payer: Medicare Other | Admitting: Physician Assistant

## 2019-03-19 ENCOUNTER — Encounter: Payer: Self-pay | Admitting: Physician Assistant

## 2019-03-19 VITALS — BP 153/67 | HR 60 | Temp 98.7°F | Ht 66.0 in | Wt 176.8 lb

## 2019-03-19 DIAGNOSIS — E118 Type 2 diabetes mellitus with unspecified complications: Secondary | ICD-10-CM

## 2019-03-19 DIAGNOSIS — I10 Essential (primary) hypertension: Secondary | ICD-10-CM | POA: Diagnosis not present

## 2019-03-19 LAB — BAYER DCA HB A1C WAIVED: HB A1C (BAYER DCA - WAIVED): 6.2 % (ref <7.0)

## 2019-03-20 LAB — CMP14+EGFR
ALT: 12 IU/L (ref 0–32)
AST: 19 IU/L (ref 0–40)
Albumin/Globulin Ratio: 1.2 (ref 1.2–2.2)
Albumin: 4 g/dL (ref 3.7–4.7)
Alkaline Phosphatase: 71 IU/L (ref 39–117)
BUN/Creatinine Ratio: 21 (ref 12–28)
BUN: 19 mg/dL (ref 8–27)
Bilirubin Total: 0.4 mg/dL (ref 0.0–1.2)
CO2: 24 mmol/L (ref 20–29)
Calcium: 9.2 mg/dL (ref 8.7–10.3)
Chloride: 101 mmol/L (ref 96–106)
Creatinine, Ser: 0.91 mg/dL (ref 0.57–1.00)
GFR calc Af Amer: 69 mL/min/{1.73_m2} (ref >59)
GFR calc non Af Amer: 60 mL/min/{1.73_m2} (ref >59)
Globulin, Total: 3.4 g/dL (ref 1.5–4.5)
Glucose: 116 mg/dL — ABNORMAL HIGH (ref 65–99)
Potassium: 4.7 mmol/L (ref 3.5–5.2)
Sodium: 142 mmol/L (ref 134–144)
Total Protein: 7.4 g/dL (ref 6.0–8.5)

## 2019-03-20 LAB — LIPID PANEL
Chol/HDL Ratio: 1.9 ratio (ref 0.0–4.4)
Cholesterol, Total: 190 mg/dL (ref 100–199)
HDL: 99 mg/dL (ref >39)
LDL Calculated: 78 mg/dL (ref 0–99)
Triglycerides: 64 mg/dL (ref 0–149)
VLDL Cholesterol Cal: 13 mg/dL (ref 5–40)

## 2019-03-20 LAB — CBC WITH DIFFERENTIAL/PLATELET
Basophils Absolute: 0 10*3/uL (ref 0.0–0.2)
Basos: 0 %
EOS (ABSOLUTE): 0.3 10*3/uL (ref 0.0–0.4)
Eos: 6 %
Hematocrit: 37.7 % (ref 34.0–46.6)
Hemoglobin: 11.8 g/dL (ref 11.1–15.9)
Immature Grans (Abs): 0 10*3/uL (ref 0.0–0.1)
Immature Granulocytes: 0 %
Lymphocytes Absolute: 1.4 10*3/uL (ref 0.7–3.1)
Lymphs: 30 %
MCH: 26.5 pg — ABNORMAL LOW (ref 26.6–33.0)
MCHC: 31.3 g/dL — ABNORMAL LOW (ref 31.5–35.7)
MCV: 85 fL (ref 79–97)
Monocytes Absolute: 0.5 10*3/uL (ref 0.1–0.9)
Monocytes: 10 %
Neutrophils Absolute: 2.6 10*3/uL (ref 1.4–7.0)
Neutrophils: 54 %
Platelets: 178 10*3/uL (ref 150–450)
RBC: 4.45 x10E6/uL (ref 3.77–5.28)
RDW: 13.3 % (ref 11.7–15.4)
WBC: 4.7 10*3/uL (ref 3.4–10.8)

## 2019-03-20 NOTE — Progress Notes (Signed)
BP (!) 153/67   Pulse 60   Temp 98.7 F (37.1 C) (Oral)   Ht 5' 6"  (1.676 m)   Wt 176 lb 12.8 oz (80.2 kg)   SpO2 (!) 86% Comment: Room air  BMI 28.54 kg/m    Subjective:    Patient ID: Sonya Oliver, female    DOB: August 15, 1941, 78 y.o.   MRN: [id number]  HPI: Sonya Oliver is a 78 y.o. female presenting on 03/19/2019 for COPD and Altered Mental Status  This patient comes in for recheck on her recent urinary tract infection, COPD and altered mental status.  She is able to come in by self today.  Overall she states she is doing much better.  She is nearly complete of the antibiotic that was given to her for a urinary tract infection.  She is still living with her daughter and they are doing a very good job taking care of her.  She denies any other issues at this time.  She will be coming back soon for continued follow-up.  Past Medical History:  Diagnosis Date  . Arthritis   . Bladder tumor   . Cancer ([place]) 2015   bladder  . Cataract   . CHF (congestive heart failure) ([place])    NO CARDIOLOGIST---  MONITORED BY PCP DR Jacelyn Grip  . COPD with emphysema ([place])    last Acute Exacerbation 08-21-2014  . Dyspnea on exertion   . H/O pulmonary edema    jan 2014--  acute CHF w/ pulmonary edema  . Hypertension   . Hyperthyroidism    currently no meds -- labs stable  . Nocturia   . Nocturnal oxygen desaturation    uses O2  HS via [place]--  and PRN daytime  . Osteoporosis   . Stroke ([place])   . Type 2 diabetes mellitus ([place])   . Ureteral tumor    Relevant past medical, surgical, family and social history reviewed and updated as indicated. Interim medical history since our last visit reviewed. Allergies and medications reviewed and updated. DATA REVIEWED: CHART IN EPIC  Family History reviewed for pertinent findings.  Review of Systems  Constitutional: Positive for fatigue.  HENT: Negative.   Eyes: Negative.   Respiratory: Positive for cough, shortness of breath and wheezing.    Cardiovascular: Negative for chest pain.  Gastrointestinal: Negative.   Genitourinary: Negative.     Allergies as of 03/19/2019   No Known Allergies     Medication List       Accurate as of March 19, 2019 11:59 PM. If you have any questions, ask your nurse or doctor.        albuterol (2.5 MG/3ML) 0.083% nebulizer solution Commonly known as: PROVENTIL Inhale 3 mLs into the lungs 4 (four) times daily as needed for shortness of breath.   albuterol 108 (90 Base) MCG/ACT inhaler Commonly known as: VENTOLIN HFA Inhale 1-2 puffs into the lungs every 6 (six) hours as needed for wheezing or shortness of breath.   alendronate 70 MG tablet Commonly known as: Fosamax Take 1 tablet (70 mg total) by mouth once a week. Take with a full glass of water on an empty stomach.   carvedilol 3.125 MG tablet Commonly known as: COREG Take 1 tablet (3.125 mg total) by mouth 2 (two) times daily.   cephALEXin 250 MG capsule Commonly known as: Keflex Take 1 capsule (250 mg total) by mouth 3 (three) times daily.   cycloSPORINE 0.05 % ophthalmic emulsion Commonly known as: Restasis MultiDose  Place 1 drop into both eyes 2 (two) times daily.   Fluticasone-Salmeterol 100-50 MCG/DOSE Aepb Commonly known as: Advair Diskus Inhale 1 puff into the lungs 2 (two) times daily.   furosemide 40 MG tablet Commonly known as: LASIX Take 1 tablet (40 mg total) by mouth daily. TAKE 1 TABLET BY MOUTH ONCE DAILY   metFORMIN 500 MG tablet Commonly known as: GLUCOPHAGE Take 1 tablet (500 mg total) by mouth daily.   potassium chloride SA 20 MEQ tablet Commonly known as: K-DUR Take 1 tablet (20 mEq total) by mouth daily.   Vitamin D3 50 MCG (2000 UT) Tabs Take 1 tablet by mouth daily.          Objective:    BP (!) 153/67   Pulse 60   Temp 98.7 F (37.1 C) (Oral)   Ht 5' 6"  (1.676 m)   Wt 176 lb 12.8 oz (80.2 kg)   SpO2 (!) 86% Comment: Room air  BMI 28.54 kg/m   No Known Allergies  Wt Readings  from Last 3 Encounters:  03/19/19 176 lb 12.8 oz (80.2 kg)  02/19/19 173 lb (78.5 kg)  11/22/18 175 lb 6.4 oz (79.6 kg)    Physical Exam Constitutional:      Appearance: She is well-developed.  HENT:     Head: Normocephalic and atraumatic.  Eyes:     Conjunctiva/sclera: Conjunctivae normal.     Pupils: Pupils are equal, round, and reactive to light.  Cardiovascular:     Rate and Rhythm: Normal rate and regular rhythm.     Heart sounds: Normal heart sounds.  Pulmonary:     Effort: Pulmonary effort is normal.     Breath sounds: Normal breath sounds.  Abdominal:     General: Bowel sounds are normal.     Palpations: Abdomen is soft.  Skin:    General: Skin is warm and dry.     Findings: No rash.  Neurological:     Mental Status: She is alert and oriented to person, place, and time.     Deep Tendon Reflexes: Reflexes are normal and symmetric.  Psychiatric:        Behavior: Behavior normal.        Thought Content: Thought content normal.        Judgment: Judgment normal.     Results for orders placed or performed in visit on 03/19/19  CMP14+EGFR  Result Value Ref Range   Glucose 116 (H) 65 - 99 mg/dL   BUN 19 8 - 27 mg/dL   Creatinine, Ser 0.91 0.57 - 1.00 mg/dL   GFR calc non Af Amer 60 >59 mL/min/1.73   GFR calc Af Amer 69 >59 mL/min/1.73   BUN/Creatinine Ratio 21 12 - 28   Sodium 142 134 - 144 mmol/L   Potassium 4.7 3.5 - 5.2 mmol/L   Chloride 101 96 - 106 mmol/L   CO2 24 20 - 29 mmol/L   Calcium 9.2 8.7 - 10.3 mg/dL   Total Protein 7.4 6.0 - 8.5 g/dL   Albumin 4.0 3.7 - 4.7 g/dL   Globulin, Total 3.4 1.5 - 4.5 g/dL   Albumin/Globulin Ratio 1.2 1.2 - 2.2   Bilirubin Total 0.4 0.0 - 1.2 mg/dL   Alkaline Phosphatase 71 39 - 117 IU/L   AST 19 0 - 40 IU/L   ALT 12 0 - 32 IU/L  Lipid panel  Result Value Ref Range   Cholesterol, Total 190 100 - 199 mg/dL   Triglycerides 64 0 - 149  mg/dL   HDL 99 >39 mg/dL   VLDL Cholesterol Cal 13 5 - 40 mg/dL   LDL Calculated 78  0 - 99 mg/dL   Chol/HDL Ratio 1.9 0.0 - 4.4 ratio  CBC with Differential/Platelet  Result Value Ref Range   WBC 4.7 3.4 - 10.8 x10E3/uL   RBC 4.45 3.77 - 5.28 x10E6/uL   Hemoglobin 11.8 11.1 - 15.9 g/dL   Hematocrit 37.7 34.0 - 46.6 %   MCV 85 79 - 97 fL   MCH 26.5 (L) 26.6 - 33.0 pg   MCHC 31.3 (L) 31.5 - 35.7 g/dL   RDW 13.3 11.7 - 15.4 %   Platelets 178 150 - 450 x10E3/uL   Neutrophils 54 Not Estab. %   Lymphs 30 Not Estab. %   Monocytes 10 Not Estab. %   Eos 6 Not Estab. %   Basos 0 Not Estab. %   Neutrophils Absolute 2.6 1.4 - 7.0 x10E3/uL   Lymphocytes Absolute 1.4 0.7 - 3.1 x10E3/uL   Monocytes Absolute 0.5 0.1 - 0.9 x10E3/uL   EOS (ABSOLUTE) 0.3 0.0 - 0.4 x10E3/uL   Basophils Absolute 0.0 0.0 - 0.2 x10E3/uL   Immature Granulocytes 0 Not Estab. %   Immature Grans (Abs) 0.0 0.0 - 0.1 x10E3/uL  Bayer DCA Hb A1c Waived  Result Value Ref Range   HB A1C (BAYER DCA - WAIVED) 6.2 <7.0 %      Assessment & Plan:   1. Essential hypertension - CMP14+EGFR - Lipid panel - CBC with Differential/Platelet - Bayer DCA Hb A1c Waived  2. Type 2 diabetes mellitus with complication, without long-term current use of insulin (HCC) - CMP14+EGFR - Lipid panel - CBC with Differential/Platelet - Bayer DCA Hb A1c Waived   Continue all other maintenance medications as listed above.  Follow up plan: Return in about 6 months (around 09/19/2019).  Educational handout given for [place] PA-C [facility] [address]  [place], [place] [address] [phone]   03/20/2019, 10:19 PM

## 2019-06-01 ENCOUNTER — Other Ambulatory Visit: Payer: Self-pay | Admitting: *Deleted

## 2019-06-01 DIAGNOSIS — Z20822 Contact with and (suspected) exposure to covid-19: Secondary | ICD-10-CM

## 2019-06-02 LAB — NOVEL CORONAVIRUS, NAA: SARS-CoV-2, NAA: NOT DETECTED

## 2019-06-04 ENCOUNTER — Telehealth: Payer: Self-pay | Admitting: Physician Assistant

## 2019-06-04 NOTE — Telephone Encounter (Signed)
Patient called in with her daughter and received her covid test result

## 2019-09-18 LAB — TSH: TSH: 0.01 — AB (ref <=5.90)

## 2019-10-02 ENCOUNTER — Ambulatory Visit: Payer: Medicare Other | Admitting: Physician Assistant

## 2019-10-05 ENCOUNTER — Other Ambulatory Visit: Payer: Self-pay

## 2019-10-08 ENCOUNTER — Encounter: Payer: Self-pay | Admitting: Physician Assistant

## 2019-10-08 ENCOUNTER — Other Ambulatory Visit: Payer: Self-pay

## 2019-10-08 ENCOUNTER — Ambulatory Visit (INDEPENDENT_AMBULATORY_CARE_PROVIDER_SITE_OTHER): Payer: Medicare Other | Admitting: Physician Assistant

## 2019-10-08 VITALS — BP 162/82 | HR 80 | Temp 99.6°F | Ht 66.0 in | Wt 180.0 lb

## 2019-10-08 DIAGNOSIS — Z23 Encounter for immunization: Secondary | ICD-10-CM | POA: Diagnosis not present

## 2019-10-08 DIAGNOSIS — E119 Type 2 diabetes mellitus without complications: Secondary | ICD-10-CM | POA: Diagnosis not present

## 2019-10-08 DIAGNOSIS — J441 Chronic obstructive pulmonary disease with (acute) exacerbation: Secondary | ICD-10-CM

## 2019-10-08 DIAGNOSIS — I10 Essential (primary) hypertension: Secondary | ICD-10-CM

## 2019-10-08 DIAGNOSIS — E118 Type 2 diabetes mellitus with unspecified complications: Secondary | ICD-10-CM

## 2019-10-08 DIAGNOSIS — E876 Hypokalemia: Secondary | ICD-10-CM

## 2019-10-08 DIAGNOSIS — M810 Age-related osteoporosis without current pathological fracture: Secondary | ICD-10-CM | POA: Diagnosis not present

## 2019-10-08 LAB — BAYER DCA HB A1C WAIVED: HB A1C (BAYER DCA - WAIVED): 6.5 % (ref <7.0)

## 2019-10-08 MED ORDER — ALENDRONATE SODIUM 70 MG PO TABS: 70.0000 mg | ORAL_TABLET | ORAL | 11 refills | Status: DC

## 2019-10-08 MED ORDER — FUROSEMIDE 40 MG PO TABS: 40.0000 mg | ORAL_TABLET | Freq: Every day | ORAL | 11 refills | Status: DC

## 2019-10-08 MED ORDER — METFORMIN HCL 500 MG PO TABS: 500.0000 mg | ORAL_TABLET | Freq: Every day | ORAL | 1 refills | Status: DC

## 2019-10-08 MED ORDER — CARVEDILOL 3.125 MG PO TABS: 3.1250 mg | ORAL_TABLET | Freq: Two times a day (BID) | ORAL | 3 refills | Status: DC

## 2019-10-08 MED ORDER — POTASSIUM CHLORIDE CRYS ER 20 MEQ PO TBCR: 20.0000 meq | EXTENDED_RELEASE_TABLET | Freq: Every day | ORAL | 11 refills | Status: DC

## 2019-10-08 MED ORDER — FLUTICASONE-SALMETEROL 100-50 MCG/DOSE IN AEPB: 1.0000 | INHALATION_SPRAY | Freq: Two times a day (BID) | RESPIRATORY_TRACT | 11 refills | Status: DC

## 2019-10-09 LAB — LIPID PANEL
Chol/HDL Ratio: 1.8 ratio (ref 0.0–4.4)
Cholesterol, Total: 172 mg/dL (ref 100–199)
HDL: 95 mg/dL (ref >39)
LDL Chol Calc (NIH): 69 mg/dL (ref 0–99)
Triglycerides: 36 mg/dL (ref 0–149)
VLDL Cholesterol Cal: 8 mg/dL (ref 5–40)

## 2019-10-09 LAB — CBC WITH DIFFERENTIAL/PLATELET
Basophils Absolute: 0 10*3/uL (ref 0.0–0.2)
Basos: 0 %
EOS (ABSOLUTE): 0.1 10*3/uL (ref 0.0–0.4)
Eos: 4 %
Hematocrit: 41.5 % (ref 34.0–46.6)
Hemoglobin: 13.2 g/dL (ref 11.1–15.9)
Immature Grans (Abs): 0 10*3/uL (ref 0.0–0.1)
Immature Granulocytes: 0 %
Lymphocytes Absolute: 1 10*3/uL (ref 0.7–3.1)
Lymphs: 25 %
MCH: 26.4 pg — ABNORMAL LOW (ref 26.6–33.0)
MCHC: 31.8 g/dL (ref 31.5–35.7)
MCV: 83 fL (ref 79–97)
Monocytes Absolute: 0.4 10*3/uL (ref 0.1–0.9)
Monocytes: 11 %
Neutrophils Absolute: 2.4 10*3/uL (ref 1.4–7.0)
Neutrophils: 60 %
Platelets: 161 10*3/uL (ref 150–450)
RBC: 5 x10E6/uL (ref 3.77–5.28)
RDW: 13.9 % (ref 11.7–15.4)
WBC: 4 10*3/uL (ref 3.4–10.8)

## 2019-10-09 LAB — CMP14+EGFR
ALT: 11 IU/L (ref 0–32)
AST: 16 IU/L (ref 0–40)
Albumin/Globulin Ratio: 1.1 — ABNORMAL LOW (ref 1.2–2.2)
Albumin: 4 g/dL (ref 3.7–4.7)
Alkaline Phosphatase: 72 IU/L (ref 39–117)
BUN/Creatinine Ratio: 15 (ref 12–28)
BUN: 13 mg/dL (ref 8–27)
Bilirubin Total: 0.6 mg/dL (ref 0.0–1.2)
CO2: 27 mmol/L (ref 20–29)
Calcium: 9.2 mg/dL (ref 8.7–10.3)
Chloride: 101 mmol/L (ref 96–106)
Creatinine, Ser: 0.85 mg/dL (ref 0.57–1.00)
GFR calc Af Amer: 75 mL/min/{1.73_m2} (ref >59)
GFR calc non Af Amer: 65 mL/min/{1.73_m2} (ref >59)
Globulin, Total: 3.8 g/dL (ref 1.5–4.5)
Glucose: 73 mg/dL (ref 65–99)
Potassium: 4.6 mmol/L (ref 3.5–5.2)
Sodium: 139 mmol/L (ref 134–144)
Total Protein: 7.8 g/dL (ref 6.0–8.5)

## 2019-10-09 LAB — TSH: TSH: 0.072 u[IU]/mL — ABNORMAL LOW (ref 0.450–4.500)

## 2019-10-09 LAB — MICROALBUMIN / CREATININE URINE RATIO
Creatinine, Urine: 33.6 mg/dL
Microalb/Creat Ratio: 15 mg/g creat (ref 0–29)
Microalbumin, Urine: 5 ug/mL

## 2019-10-10 ENCOUNTER — Telehealth: Payer: Self-pay | Admitting: Physician Assistant

## 2019-10-10 ENCOUNTER — Encounter: Payer: Self-pay | Admitting: Physician Assistant

## 2019-10-10 NOTE — Progress Notes (Signed)
Subjective:    Patient ID: Sonya Oliver, female    DOB: August 17, 1941, 79 y.o.   MRN: [id number]  Chief Complaint  Patient presents with  . Medical Management of Chronic Issues    [id number]  . Diabetes    Diabetes She presents for her follow-up diabetic visit. She has type 2 diabetes mellitus. Pertinent negatives for diabetes include no chest pain, no fatigue, no foot ulcerations, no visual change and no weakness. There are no hypoglycemic complications. There are no diabetic complications. Risk factors for coronary artery disease include dyslipidemia, hypertension, diabetes mellitus and post-menopausal. She is compliant with treatment all of the time. Her weight is stable. An ACE inhibitor/angiotensin II receptor blocker is not being taken. She sees a podiatrist.Eye exam is current.  Hypertension This is a chronic problem. The current episode started more than 1 year ago. The problem is unchanged. The problem is controlled. Associated symptoms include shortness of breath. Pertinent negatives include no chest pain. Past treatments include beta blockers and diuretics. The current treatment provides moderate improvement.  Foot Injury  The incident occurred more than 1 week ago. The injury mechanism is unknown. The pain is present in the right foot and left foot. The patient is experiencing no pain. Pertinent negatives include no loss of motion, numbness or tingling.  Cough This is a chronic problem. The current episode started more than 1 year ago. The problem has been waxing and waning. Associated symptoms include shortness of breath. Pertinent negatives include no chest pain or fever. She has tried a beta-agonist inhaler for the symptoms. The treatment provided moderate relief. Her past medical history is significant for COPD.     Past Medical History:  Diagnosis Date  . Arthritis   . Bladder tumor   . Cancer ([facility] 2015   bladder  . Cataract   . CHF (congestive heart failure) ([facility]     NO CARDIOLOGIST---  MONITORED BY PCP DR WJacelyn Grip . COPD with emphysema ([facility]    last Acute Exacerbation 08-21-2014  . Dyspnea on exertion   . H/O pulmonary edema    jan 2014--  acute CHF w/ pulmonary edema  . Hypertension   . Hyperthyroidism    currently no meds -- labs stable  . Nocturia   . Nocturnal oxygen desaturation    uses O2  HS via [place]--  and PRN daytime  . Osteoporosis   . Stroke ([facility]   . Type 2 diabetes mellitus ([facility]   . Ureteral tumor     Past Surgical History:  Procedure Laterality Date  . ABDOMINAL HYSTERECTOMY  1980'S   W/ BILATERAL SALPINGO-OOPHORECTOMY  . CATARACT EXTRACTION W/PHACO Left 01/31/2014   Procedure: CATARACT EXTRACTION PHACO AND INTRAOCULAR LENS PLACEMENT (IOC);  Surgeon: KTonny Branch MD;  Location: [facility];  Service: Ophthalmology;  Laterality: Left;  CDE: 23.78  . CATARACT EXTRACTION W/PHACO Right 02/28/2014   Procedure: CATARACT EXTRACTION PHACO AND INTRAOCULAR LENS PLACEMENT (IOC);  Surgeon: KTonny Branch MD;  Location: [facility];  Service: Ophthalmology;  Laterality: Right;  CDE:  11.34  . CYSTOSCOPY  03/23/2012   Procedure: CYSTOSCOPY;  Surgeon: MClaybon Jabs MD;  Location: [facility];  Service: Urology;  Laterality: N/A;  . CYSTOSCOPY WITH RETROGRADE PYELOGRAM, URETEROSCOPY AND STENT PLACEMENT Right 10/21/2014   Procedure: CYSTOSCOPY WITH RETROGRADE PYELOGRAM, URETEROSCOPY, BLADDER TUMOR BIOPSY;  Surgeon: MClaybon Jabs MD;  [facility]  Service: Urology;  Laterality: Right;  . TRANSTHORACIC ECHOCARDIOGRAM  06-21-2013  moderate LVH,  grade I diastolic dysfunction, ef 94-76%,  mild MR & TR  . TRANSURETHRAL RESECTION OF BLADDER TUMOR  03/23/2012   Procedure: TRANSURETHRAL RESECTION OF BLADDER TUMOR (TURBT);  Surgeon: Claybon Jabs, MD;  Location: [facility];  Service: Urology;  Laterality: N/A;  . TRANSURETHRAL RESECTION OF BLADDER TUMOR N/A 01/08/2013   Procedure: TRANSURETHRAL RESECTION OF BLADDER TUMOR (TURBT);  Surgeon: Claybon Jabs,  MD;  Location[facility];  Service: Urology;  Laterality: N/A;    Family History  Problem Relation Age of Onset  . Diabetes Father   . Congestive Heart Failure Father   . COPD Father   . Kidney disease Father   . Hypertension Father   . Diabetes Mother        with kidney disease  . Kidney disease Mother   . Heart attack Brother   . Asthma Brother   . Diabetes Daughter   . Diabetes Son   . Cancer Brother        throat  . Hypertension Brother     Social History   Socioeconomic History  . Marital status: Widowed    Spouse name: Not on file  . Number of children: 4  . Years of education: 10  . Highest education level: 10th grade  Occupational History  . 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 Smoking status: Former Smoker    Packs/day: 0.25    Years: 50.00    Pack years: 12.50    Types: Cigarettes    Quit date: 09/24/2006    Years since quitting: 13.0  . Smokeless tobacco: Never Used  Substance and Sexual Activity  . Alcohol use: No  . Drug use: No  . Sexual activity: Not Currently  Other Topics Concern  . Not on file  Social History Narrative   Occupation: worked in [place] yrs   Social Determinants of Radio broadcast assistant Strain:   . Difficulty of Paying Living Expenses: Not on file  Food Insecurity:   . Worried About Charity fundraiser in the Last Year: Not on file  . Ran Out of Food in the Last Year: Not on file  Transportation Needs:   . Lack of Transportation (Medical): Not on file  . Lack of Transportation (Non-Medical): Not on file  Physical Activity:   . Days of Exercise per Week: Not on file  . Minutes of Exercise per Session: Not on file  Stress:   . Feeling of Stress : Not on file  Social Connections:   . Frequency of Communication with Friends and Family: Not on file  . Frequency of Social Gatherings with Friends and Family: Not on file  . Attends Religious Services: Not on file  . Active  Member of Clubs or Organizations: Not on file  . Attends Archivist Meetings: Not on file  . Marital Status: Not on file  Intimate Partner Violence:   . Fear of Current or Ex-Partner: Not on file  . Emotionally Abused: Not on file  . Physically Abused: Not on file  . Sexually Abused: Not on file    Outpatient Medications Prior to Visit  Medication Sig Dispense Refill  . albuterol (PROVENTIL) (2.5 MG/3ML) 0.083% nebulizer solution Inhale 3 mLs into the lungs 4 (four) times daily as needed for shortness of breath. 240 mL 11  . albuterol (VENTOLIN HFA) 108 (90 Base) MCG/ACT inhaler Inhale 1-2 puffs into the lungs every 6 (six) hours as  needed for wheezing or shortness of breath. 18 g 12  . Cholecalciferol (VITAMIN D3) 2000 UNITS TABS Take 1 tablet by mouth daily.    . cycloSPORINE (RESTASIS MULTIDOSE) 0.05 % ophthalmic emulsion Place 1 drop into both eyes 2 (two) times daily. 5.5 mL 11  . alendronate (FOSAMAX) 70 MG tablet Take 1 tablet (70 mg total) by mouth once a week. Take with a full glass of water on an empty stomach. 4 tablet 11  . carvedilol (COREG) 3.125 MG tablet Take 1 tablet (3.125 mg total) by mouth 2 (two) times daily. 180 tablet 3  . cephALEXin (KEFLEX) 250 MG capsule Take 1 capsule (250 mg total) by mouth 3 (three) times daily. 30 capsule 0  . Fluticasone-Salmeterol (ADVAIR DISKUS) 100-50 MCG/DOSE AEPB Inhale 1 puff into the lungs 2 (two) times daily. 60 each 11  . furosemide (LASIX) 40 MG tablet Take 1 tablet (40 mg total) by mouth daily. TAKE 1 TABLET BY MOUTH ONCE DAILY 30 tablet 11  . metFORMIN (GLUCOPHAGE) 500 MG tablet Take 1 tablet (500 mg total) by mouth daily. 90 tablet 1  . potassium chloride SA (K-DUR) 20 MEQ tablet Take 1 tablet (20 mEq total) by mouth daily. 30 tablet 11   No facility-administered medications prior to visit.    No Known Allergies  Review of Systems  Constitutional: Negative.  Negative for activity change, fatigue and fever.  HENT:  Negative.   Eyes: Negative.   Respiratory: Positive for cough and shortness of breath.   Cardiovascular: Negative.  Negative for chest pain.  Gastrointestinal: Negative.  Negative for abdominal pain.  Endocrine: Negative.   Genitourinary: Negative.  Negative for dysuria.  Musculoskeletal: Negative.   Skin: Negative.   Neurological: Negative.  Negative for tingling, weakness and numbness.       Objective:    Physical Exam Constitutional:      General: She is not in acute distress.    Appearance: Normal appearance. She is well-developed.  HENT:     Head: Normocephalic and atraumatic.  Cardiovascular:     Rate and Rhythm: Normal rate.  Pulmonary:     Effort: Pulmonary effort is normal.  Skin:    General: Skin is warm and dry.     Findings: No rash.  Neurological:     Mental Status: She is alert and oriented to person, place, and time.     Deep Tendon Reflexes: Reflexes are normal and symmetric.    Diabetic Foot Exam - Simple   Simple Foot Form Diabetic Foot exam was performed with the following findings: Yes 10/08/2019  9:28 AM  Visual Inspection See comments: Yes Sensation Testing Intact to touch and monofilament testing bilaterally: Yes Pulse Check Posterior Tibialis and Dorsalis pulse intact bilaterally: Yes Comments Disfigurement of toes and large thickened toenails with abnormal growth.      BP (!) 162/82   Pulse 80   Temp 99.6 F (37.6 C)   Ht '5\' 6"'$  (1.676 m)   Wt 180 lb (81.6 kg)   SpO2 91%   BMI 29.05 kg/m  Wt Readings from Last 3 Encounters:  10/08/19 180 lb (81.6 kg)  03/19/19 176 lb 12.8 oz (80.2 kg)  02/19/19 173 lb (78.5 kg)    Health Maintenance Due  Topic Date Due  . FOOT EXAM  07/08/2017  . MAMMOGRAM  09/29/2017  . OPHTHALMOLOGY EXAM  12/08/2018    There are no preventive care reminders to display for this patient.   Lab Results  Component Value Date  TSH 0.072 (L) 10/08/2019   Lab Results  Component Value Date   WBC 4.0  10/08/2019   HGB 13.2 10/08/2019   HCT 41.5 10/08/2019   MCV 83 10/08/2019   PLT 161 10/08/2019   Lab Results  Component Value Date   NA 139 10/08/2019   K 4.6 10/08/2019   CO2 27 10/08/2019   GLUCOSE 73 10/08/2019   BUN 13 10/08/2019   CREATININE 0.85 10/08/2019   BILITOT 0.6 10/08/2019   ALKPHOS 72 10/08/2019   AST 16 10/08/2019   ALT 11 10/08/2019   PROT 7.8 10/08/2019   ALBUMIN 4.0 10/08/2019   CALCIUM 9.2 10/08/2019   ANIONGAP 10 12/05/2016   Lab Results  Component Value Date   CHOL 172 10/08/2019   Lab Results  Component Value Date   HDL 95 10/08/2019   Lab Results  Component Value Date   LDLCALC 69 10/08/2019   Lab Results  Component Value Date   TRIG 36 10/08/2019   Lab Results  Component Value Date   CHOLHDL 1.8 10/08/2019   Lab Results  Component Value Date   HGBA1C 6.5 10/08/2019       Assessment & Plan:   Problem List Items Addressed This Visit      Cardiovascular and Mediastinum   HTN (hypertension)   Relevant Medications   carvedilol (COREG) 3.125 MG tablet   furosemide (LASIX) 40 MG tablet     Respiratory   COPD (chronic obstructive pulmonary disease) (HCC)   Relevant Medications   Fluticasone-Salmeterol (ADVAIR DISKUS) 100-50 MCG/DOSE AEPB     Musculoskeletal and Integument   Osteoporosis   Relevant Medications   alendronate (FOSAMAX) 70 MG tablet     Other   Hypokalemia   Relevant Medications   potassium chloride SA (KLOR-CON) 20 MEQ tablet    Other Visit Diagnoses    Type 2 diabetes mellitus with complication, without long-term current use of insulin (HCC)    -  Primary   Relevant Medications   metFORMIN (GLUCOPHAGE) 500 MG tablet   Other Relevant Orders   Bayer DCA Hb A1c Waived (Completed)   Ambulatory referral to Podiatry   CBC with Differential/Platelet (Completed)   CMP14+EGFR (Completed)   Lipid panel (Completed)   Microalbumin / creatinine urine ratio (Completed)   TSH (Completed)   Diabetes mellitus  without complication (HCC)       Relevant Medications   metFORMIN (GLUCOPHAGE) 500 MG tablet   Need for immunization against influenza       Relevant Orders   Flu Vaccine QUAD High Dose(Fluad) (Completed)       Meds ordered this encounter  Medications  . alendronate (FOSAMAX) 70 MG tablet    Sig: Take 1 tablet (70 mg total) by mouth once a week. Take with a full glass of water on an empty stomach.    Dispense:  4 tablet    Refill:  11    Order Specific Question:   Supervising Provider    Answer:   Janora Norlander [[id number]]  . carvedilol (COREG) 3.125 MG tablet    Sig: Take 1 tablet (3.125 mg total) by mouth 2 (two) times daily.    Dispense:  180 tablet    Refill:  3    Order Specific Question:   Supervising Provider    Answer:   Janora Norlander [[id number]]  . metFORMIN (GLUCOPHAGE) 500 MG tablet    Sig: Take 1 tablet (500 mg total) by mouth daily.    Dispense:  90 tablet  Refill:  1    Order Specific Question:   Supervising Provider    Answer:   Janora Norlander [[id number]]  . furosemide (LASIX) 40 MG tablet    Sig: Take 1 tablet (40 mg total) by mouth daily. TAKE 1 TABLET BY MOUTH ONCE DAILY    Dispense:  30 tablet    Refill:  11    Order Specific Question:   Supervising Provider    Answer:   Janora Norlander [[id number]]  . potassium chloride SA (KLOR-CON) 20 MEQ tablet    Sig: Take 1 tablet (20 mEq total) by mouth daily.    Dispense:  30 tablet    Refill:  11    Order Specific Question:   Supervising Provider    Answer:   Janora Norlander [[id number]]  . Fluticasone-Salmeterol (ADVAIR DISKUS) 100-50 MCG/DOSE AEPB    Sig: Inhale 1 puff into the lungs 2 (two) times daily.    Dispense:  60 each    Refill:  11    Order Specific Question:   Supervising Provider    Answer:   Janora Norlander [[id number]]     Terald Sleeper, PA-C   Terald Sleeper PA-C [facility] [place] [address]  [place], [place] [address] [phone]

## 2019-10-10 NOTE — Telephone Encounter (Signed)
[id number]

## 2019-10-11 ENCOUNTER — Other Ambulatory Visit: Payer: Self-pay | Admitting: Physician Assistant

## 2019-10-11 DIAGNOSIS — R7989 Other specified abnormal findings of blood chemistry: Secondary | ICD-10-CM

## 2019-10-11 NOTE — Telephone Encounter (Signed)
Patient is aware of results.

## 2019-11-06 ENCOUNTER — Ambulatory Visit (INDEPENDENT_AMBULATORY_CARE_PROVIDER_SITE_OTHER): Payer: Medicare Other | Admitting: "Endocrinology

## 2019-11-06 ENCOUNTER — Encounter: Payer: Self-pay | Admitting: "Endocrinology

## 2019-11-06 ENCOUNTER — Other Ambulatory Visit: Payer: Self-pay

## 2019-11-06 VITALS — BP 167/69 | HR 74 | Ht 66.0 in | Wt 189.4 lb

## 2019-11-06 DIAGNOSIS — E0590 Thyrotoxicosis, unspecified without thyrotoxic crisis or storm: Secondary | ICD-10-CM

## 2019-11-06 NOTE — Progress Notes (Signed)
11/06/2019         Endocrinology Consult Note    Subjective:    Patient ID: Sonya Oliver, female    DOB: 05/14/1941, PCP Terald Sleeper, PA-C.   Past Medical History:  Diagnosis Date  . Arthritis   . Bladder tumor   . Cancer ([place]) 2015   bladder  . Cataract   . CHF (congestive heart failure) ([place])    NO CARDIOLOGIST---  MONITORED BY PCP DR Jacelyn Grip  . COPD with emphysema ([place])    last Acute Exacerbation 08-21-2014  . Dyspnea on exertion   . H/O pulmonary edema    jan 2014--  acute CHF w/ pulmonary edema  . Hypertension   . Hyperthyroidism    currently no meds -- labs stable  . Nocturia   . Nocturnal oxygen desaturation    uses O2  HS via [place]--  and PRN daytime  . Osteoporosis   . Stroke ([place])   . Type 2 diabetes mellitus ([place])   . Ureteral tumor     Past Surgical History:  Procedure Laterality Date  . ABDOMINAL HYSTERECTOMY  1980'S   W/ BILATERAL SALPINGO-OOPHORECTOMY  . CATARACT EXTRACTION W/PHACO Left 01/31/2014   Procedure: CATARACT EXTRACTION PHACO AND INTRAOCULAR LENS PLACEMENT (IOC);  Surgeon: Tonny Branch, MD;  Location: [facility];  Service: Ophthalmology;  Laterality: Left;  CDE: 23.78  . CATARACT EXTRACTION W/PHACO Right 02/28/2014   Procedure: CATARACT EXTRACTION PHACO AND INTRAOCULAR LENS PLACEMENT (IOC);  Surgeon: Tonny Branch, MD;  Location: [facility];  Service: Ophthalmology;  Laterality: Right;  CDE:  11.34  . CYSTOSCOPY  03/23/2012   Procedure: CYSTOSCOPY;  Surgeon: Claybon Jabs, MD;  Location: [facility];  Service: Urology;  Laterality: N/A;  . CYSTOSCOPY WITH RETROGRADE PYELOGRAM, URETEROSCOPY AND STENT PLACEMENT Right 10/21/2014   Procedure: CYSTOSCOPY WITH RETROGRADE PYELOGRAM, URETEROSCOPY, BLADDER TUMOR BIOPSY;  Surgeon: Claybon Jabs, MD;  Location: [facility];  Service: Urology;  Laterality: Right;  . TRANSTHORACIC ECHOCARDIOGRAM  06-21-2013   moderate LVH,  grade I diastolic dysfunction, ef 60-45%,  mild MR & TR  . TRANSURETHRAL RESECTION OF  BLADDER TUMOR  03/23/2012   Procedure: TRANSURETHRAL RESECTION OF BLADDER TUMOR (TURBT);  Surgeon: Claybon Jabs, MD;  Location: [facility];  Service: Urology;  Laterality: N/A;  . TRANSURETHRAL RESECTION OF BLADDER TUMOR N/A 01/08/2013   Procedure: TRANSURETHRAL RESECTION OF BLADDER TUMOR (TURBT);  Surgeon: Claybon Jabs, MD;  Location: [facility];  Service: Urology;  Laterality: N/A;    Social History   Socioeconomic History  . Marital status: Widowed    Spouse name: Not on file  . Number of children: 4  . Years of education: 10  . Highest education level: 10th grade  Occupational History  . 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 Smoking status: Former Smoker    Packs/day: 0.25    Years: 50.00    Pack years: 12.50    Types: Cigarettes    Quit date: 09/24/2006    Years since quitting: 13.1  . Smokeless tobacco: Never Used  Substance and Sexual Activity  . Alcohol use: No  . Drug use: No  . Sexual activity: Not Currently  Other Topics Concern  . Not on file  Social History Narrative   Occupation: worked in [place] yrs   Social Determinants of Radio broadcast assistant Strain:   . Difficulty of Paying Living Expenses: Not on file  Food Insecurity:   .  Worried About Charity fundraiser in the Last Year: Not on file  . Ran Out of Food in the Last Year: Not on file  Transportation Needs:   . Lack of Transportation (Medical): Not on file  . Lack of Transportation (Non-Medical): Not on file  Physical Activity:   . Days of Exercise per Week: Not on file  . Minutes of Exercise per Session: Not on file  Stress:   . Feeling of Stress : Not on file  Social Connections:   . Frequency of Communication with Friends and Family: Not on file  . Frequency of Social Gatherings with Friends and Family: Not on file  . Attends Religious Services: Not on file  . Active Member of Clubs or Organizations: Not on file  . Attends Archivist  Meetings: Not on file  . Marital Status: Not on file    Family History  Problem Relation Age of Onset  . Diabetes Father   . Congestive Heart Failure Father   . COPD Father   . Kidney disease Father   . Hypertension Father   . Diabetes Mother        with kidney disease  . Kidney disease Mother   . Hypertension Mother   . Hyperlipidemia Mother   . Heart attack Mother   . Stroke Mother   . Heart attack Brother   . Asthma Brother   . Diabetes Daughter   . Diabetes Son   . Cancer Brother        throat  . Hypertension Brother     Outpatient Encounter Medications as of 11/06/2019  Medication Sig  . albuterol (PROVENTIL) (2.5 MG/3ML) 0.083% nebulizer solution Inhale 3 mLs into the lungs 4 (four) times daily as needed for shortness of breath.  [name] albuterol (VENTOLIN HFA) 108 (90 Base) MCG/ACT inhaler Inhale 1-2 puffs into the lungs every 6 (six) hours as needed for wheezing or shortness of breath.  [name] alendronate (FOSAMAX) 70 MG tablet Take 1 tablet (70 mg total) by mouth once a week. Take with a full glass of water on an empty stomach.  . carvedilol (COREG) 3.125 MG tablet Take 1 tablet (3.125 mg total) by mouth 2 (two) times daily.  . Cholecalciferol (VITAMIN D3) 2000 UNITS TABS Take 1 tablet by mouth daily.  . cycloSPORINE (RESTASIS MULTIDOSE) 0.05 % ophthalmic emulsion Place 1 drop into both eyes 2 (two) times daily.  . Fluticasone-Salmeterol (ADVAIR DISKUS) 100-50 MCG/DOSE AEPB Inhale 1 puff into the lungs 2 (two) times daily.  . furosemide (LASIX) 40 MG tablet Take 1 tablet (40 mg total) by mouth daily. TAKE 1 TABLET BY MOUTH ONCE DAILY  . metFORMIN (GLUCOPHAGE) 500 MG tablet Take 1 tablet (500 mg total) by mouth daily.  . potassium chloride SA (KLOR-CON) 20 MEQ tablet Take 1 tablet (20 mEq total) by mouth daily.   No facility-administered encounter medications on file as of 11/06/2019.    ALLERGIES: No Known Allergies  VACCINATION STATUS: Immunization History  Administered  Date(s) Administered  . Fluad Quad(high Dose 65+) 10/08/2019  . Influenza, High Dose Seasonal PF 05/25/2017, 05/24/2018  . Influenza,inj,Quad PF,6+ Mos 06/20/2013, 05/30/2014, 06/09/2015, 06/10/2016  . Pneumococcal Conjugate-13 03/07/2015  . Pneumococcal Polysaccharide-23 04/26/2011  . Td 04/26/2011  . Tdap 04/26/2011  . Zoster 04/16/2013     HPI  ADISON REIFSTECK is 79 y.o. female who presents today with a medical history as above. she is being seen in consultation for abnormal thyroid function tests  requested by  Terald Sleeper, PA-C.   Elderly patient not a good historian.  Her history is obtained through review of her records and talking to her daughter will accompany her to clinic.  Her medical records.  Document suppressed TSH starting from at least 2014.  Most recent one was on October 08, 2019 showing suppressed TSH of 0.072.  She reports that she has lost approximately 30 pounds over the last 3 years.  She has intermittent palpitations, denies heat/cold intolerance.  she denies dysphagia, choking, shortness of breath, no recent voice change.    she denies family history of thyroid dysfunction, but denies family hx of thyroid cancer. she denies personal history of goiter. she is not on any anti-thyroid medications nor on any thyroid hormone supplements. she  is willing to proceed with appropriate work up and therapy for thyrotoxicosis.                           Review of systems  Constitutional: + weight loss, + fatigue, - subjective hyperthermia Eyes: no blurry vision, - xerophthalmia ENT: no sore throat, no nodules palpated in throat, no dysphagia/odynophagia, nor hoarseness Cardiovascular: no Chest Pain, no Shortness of Breath, +  palpitations, no leg swelling Respiratory: no cough, no SOB Gastrointestinal: no Nausea, no Vomiting, no Diarhhea Musculoskeletal: no muscle/joint aches Skin: no rashes Neurological: +  tremors, no numbness, no tingling, no  dizziness Psychiatric: no depression, +  anxiety   Objective:    BP (!) 167/69   Pulse 74   Ht 5\' 6"  (1.676 m)   Wt 189 lb 6.4 oz (85.9 kg)   BMI 30.57 kg/m   Wt Readings from Last 3 Encounters:  11/06/19 189 lb 6.4 oz (85.9 kg)  10/08/19 180 lb (81.6 kg)  03/19/19 176 lb 12.8 oz (80.2 kg)                                                Physical exam  Constitutional: Body mass index is 30.57 kg/m., not in acute distress, + nomal  state of mind Eyes: PERRLA, EOMI, - exophthalmos ENT: moist mucous membranes, +  thyromegaly, no cervical lymphadenopathy Cardiovascular: + normal precordial activity, +tachycardic,  no Murmur/Rubs/Gallops Respiratory:  adequate breathing efforts, no gross chest deformity, Clear to auscultation bilaterally Gastrointestinal: abdomen soft, Non -tender, No distension, Bowel Sounds present Musculoskeletal: no gross deformities, strength intact in all four extremities Skin: moist, warm, no rashes Neurological:  -  tremor with outstretched hands,  + Deep Tendon Reflexes  on both lower extremities.   CMP     Component Value Date/Time   NA 139 10/08/2019 1146   K 4.6 10/08/2019 1146   CL 101 10/08/2019 1146   CO2 27 10/08/2019 1146   GLUCOSE 73 10/08/2019 1146   GLUCOSE 171 (H) 12/05/2016 0614   BUN 13 10/08/2019 1146   CREATININE 0.85 10/08/2019 1146   CREATININE 0.79 03/22/2013 1707   CALCIUM 9.2 10/08/2019 1146   PROT 7.8 10/08/2019 1146   ALBUMIN 4.0 10/08/2019 1146   AST 16 10/08/2019 1146   ALT 11 10/08/2019 1146   ALKPHOS 72 10/08/2019 1146   BILITOT 0.6 10/08/2019 1146   GFRNONAA 65 10/08/2019 1146   GFRNONAA 74 03/22/2013 1707   GFRAA 75 10/08/2019 1146   GFRAA 86 03/22/2013 1707     CBC  Component Value Date/Time   WBC 4.0 10/08/2019 1146   WBC 5.2 12/02/2016 2255   RBC 5.00 10/08/2019 1146   RBC 4.97 12/02/2016 2255   HGB 13.2 10/08/2019 1146   HCT 41.5 10/08/2019 1146   PLT 161 10/08/2019 1146   MCV 83 10/08/2019 1146    MCH 26.4 (L) 10/08/2019 1146   MCH 28.2 12/02/2016 2255   MCHC 31.8 10/08/2019 1146   MCHC 31.8 12/02/2016 2255   RDW 13.9 10/08/2019 1146   LYMPHSABS 1.0 10/08/2019 1146   MONOABS 0.5 12/02/2016 2255   EOSABS 0.1 10/08/2019 1146   BASOSABS 0.0 10/08/2019 1146     Diabetic Labs (most recent): Lab Results  Component Value Date   HGBA1C 6.5 10/08/2019   HGBA1C 6.2 03/19/2019   HGBA1C 6.0 05/24/2018    Lipid Panel     Component Value Date/Time   CHOL 172 10/08/2019 1146   CHOL 179 03/22/2013 1707   TRIG 36 10/08/2019 1146   TRIG 54 03/22/2013 1707   HDL 95 10/08/2019 1146   HDL 70 03/22/2013 1707   CHOLHDL 1.8 10/08/2019 1146   LDLCALC 69 10/08/2019 1146   LDLCALC 98 03/22/2013 1707   LABVLDL 8 10/08/2019 1146     Lab Results  Component Value Date   TSH 0.072 (L) 10/08/2019   TSH 0.472 02/21/2017   TSH 0.585 12/14/2015   TSH 0.366 (L) 06/21/2014   TSH 0.019 (L) 06/19/2013   TSH 0.058 (L) 03/22/2013   TSH 0.120 (L) 09/13/2012   TSH 0.118 (L) 09/12/2012   TSH 0.229 (L) 03/21/2012   TSH 0.300 (L) 02/24/2012   FREET4 1.27 06/19/2013   FREET4 1.29 09/13/2012        Assessment & Plan:   1. Subclinical hyperthyroidism  she is being seen at a kind request of Terald Sleeper, PA-C. her history and most recent labs are reviewed, and she was examined clinically. Subjective and objective findings are consistent with thyrotoxicosis likely from primary hyperthyroidism. The potential risks of untreated thyrotoxicosis and the need for definitive therapy have been discussed in detail with her, and she agrees to proceed with diagnostic workup and treatment plan.   I like to repeat full profile thyroid function tests today with TSH,  fT4/free T3 and and antithyroid antibodies, as well as confirmatory thyroid uptake and scan will be scheduled to be done as soon as possible.   Options of therapy are discussed with her.    she will return in 10 days with her lab results and  thyroid uptake and scan for treatment decision.  She is not tachycardic, pulse rate at 74.  I did not initiate any new prescriptions for her today.  -Patient is advised to maintain close follow up with Terald Sleeper, PA-C for primary care needs.   - Time spent with the patient: 60 minutes, of which >50% was spent in obtaining information about her symptoms, reviewing her previous labs, evaluations, and treatments, counseling her about her abnormal thyroid function tests suggestive of hyperthyroidism, and developing a plan to confirm the diagnosis and long term treatment as necessary. Please refer to " Patient Self Inventory" in the Media  tab for reviewed elements of pertinent patient history.  Sonya Oliver participated in the discussions, expressed understanding, and voiced agreement with the above plans.  All questions were answered to her satisfaction. she is encouraged to contact clinic should she have any questions or concerns prior to her return visit.   Follow up plan: Return in  about 10 days (around 11/16/2019) for Labs Today- Non-Fasting Ok, Follow up with Thyroid Uptake and Scan.   Thank you for involving me in the care of this pleasant patient, and I will continue to update you with her progress.  Glade Lloyd, MD [facility] Phone: [phone]  Fax: [phone]   11/06/2019, 1:16 PM  This note was partially dictated with voice recognition software. Similar sounding words can be transcribed inadequately or may not  be corrected upon review.

## 2019-11-07 ENCOUNTER — Telehealth: Payer: Self-pay | Admitting: "Endocrinology

## 2019-11-07 LAB — TSH: TSH: 0.04 mIU/L — ABNORMAL LOW (ref 0.40–4.50)

## 2019-11-07 LAB — T4, FREE: Free T4: 1.3 ng/dL (ref 0.8–1.8)

## 2019-11-07 LAB — T3, FREE: T3, Free: 3.3 pg/mL (ref 2.3–4.2)

## 2019-11-07 LAB — THYROID PEROXIDASE ANTIBODY: Thyroperoxidase Ab SerPl-aCnc: <1 IU/mL (ref <9)

## 2019-11-07 LAB — THYROGLOBULIN ANTIBODY: Thyroglobulin Ab: <1 IU/mL (ref <=1)

## 2019-11-07 NOTE — Telephone Encounter (Signed)
NM Thyroid/ uptake/scan has been approved. PA # [id number]

## 2019-11-13 ENCOUNTER — Encounter (HOSPITAL_COMMUNITY): Payer: Medicare Other

## 2019-11-14 ENCOUNTER — Encounter (HOSPITAL_COMMUNITY): Payer: Medicare Other

## 2019-11-15 ENCOUNTER — Ambulatory Visit: Payer: Medicare Other

## 2019-11-16 ENCOUNTER — Ambulatory Visit: Payer: Medicare Other

## 2019-11-22 ENCOUNTER — Ambulatory Visit: Payer: Medicare Other | Attending: Internal Medicine

## 2019-11-22 ENCOUNTER — Encounter (HOSPITAL_COMMUNITY)
Admission: RE | Admit: 2019-11-22 | Discharge: 2019-11-22 | Disposition: A | Discharge status: Home / Self Care | Payer: Medicare Other | Source: Ambulatory Visit | Attending: "Endocrinology | Admitting: "Endocrinology

## 2019-11-22 ENCOUNTER — Other Ambulatory Visit: Payer: Self-pay

## 2019-11-22 ENCOUNTER — Ambulatory Visit: Payer: Medicare Other | Admitting: "Endocrinology

## 2019-11-22 DIAGNOSIS — Z23 Encounter for immunization: Secondary | ICD-10-CM

## 2019-11-22 DIAGNOSIS — E0590 Thyrotoxicosis, unspecified without thyrotoxic crisis or storm: Secondary | ICD-10-CM | POA: Insufficient documentation

## 2019-11-22 MED ORDER — SODIUM IODIDE I-123 7.4 MBQ CAPS
448.0000 | ORAL_CAPSULE | Freq: Once | ORAL | Status: AC
  Administered 2019-11-22: 448 via ORAL

## 2019-11-22 NOTE — Progress Notes (Signed)
   Covid-19 Vaccination Clinic  Name:  Sonya Oliver    MRN: [id number] DOB: [birth year]  11/22/2019  [name] was observed post Covid-19 immunization for 15 minutes without incident. She was provided with Vaccine Information Sheet and instruction to access the V-Safe system.   [name] was instructed to call 911 with any severe reactions post vaccine: [name] Difficulty breathing  . Swelling of face and throat  . A fast heartbeat  . A bad rash all over body  . Dizziness and weakness   Immunizations Administered    Name Date Dose VIS Date Route   Moderna COVID-19 Vaccine 11/22/2019  8:25 AM 0.5 mL 07/31/2019 Intramuscular   Manufacturer: Moderna   Lot: [id number]   [place]: [id number]

## 2019-11-23 ENCOUNTER — Encounter (HOSPITAL_COMMUNITY)
Admission: RE | Admit: 2019-11-23 | Discharge: 2019-11-23 | Disposition: A | Discharge status: Home / Self Care | Payer: Medicare Other | Source: Ambulatory Visit | Attending: "Endocrinology | Admitting: "Endocrinology

## 2019-12-17 ENCOUNTER — Ambulatory Visit: Payer: Medicare Other | Admitting: "Endocrinology

## 2019-12-17 ENCOUNTER — Encounter: Payer: Self-pay | Admitting: *Deleted

## 2019-12-25 ENCOUNTER — Ambulatory Visit (INDEPENDENT_AMBULATORY_CARE_PROVIDER_SITE_OTHER): Payer: Medicare Other | Admitting: "Endocrinology

## 2019-12-25 ENCOUNTER — Other Ambulatory Visit: Payer: Self-pay

## 2019-12-25 ENCOUNTER — Ambulatory Visit: Payer: Medicare Other | Attending: Internal Medicine

## 2019-12-25 ENCOUNTER — Encounter: Payer: Self-pay | Admitting: "Endocrinology

## 2019-12-25 VITALS — BP 152/83 | HR 83 | Ht 66.0 in | Wt 191.2 lb

## 2019-12-25 DIAGNOSIS — E049 Nontoxic goiter, unspecified: Secondary | ICD-10-CM | POA: Insufficient documentation

## 2019-12-25 DIAGNOSIS — Z23 Encounter for immunization: Secondary | ICD-10-CM

## 2019-12-25 DIAGNOSIS — E0590 Thyrotoxicosis, unspecified without thyrotoxic crisis or storm: Secondary | ICD-10-CM

## 2019-12-25 MED ORDER — METHIMAZOLE 5 MG PO TABS
5.0000 mg | ORAL_TABLET | Freq: Every day | ORAL | 3 refills | Status: DC
Start: 2019-12-25 — End: 2020-03-17

## 2019-12-25 NOTE — Progress Notes (Signed)
12/25/2019          Endocrinology follow-up note    Subjective:    Patient ID: Sonya Oliver, female    DOB: [birth year], PCP Terald Sleeper, PA-C.   Past Medical History:  Diagnosis Date  . Arthritis   . Bladder tumor   . Cancer ([place]) 2015   bladder  . Cataract   . CHF (congestive heart failure) ([place])    NO CARDIOLOGIST---  MONITORED BY PCP DR Jacelyn Grip  . COPD with emphysema ([place])    last Acute Exacerbation 08-21-2014  . Dyspnea on exertion   . H/O pulmonary edema    jan 2014--  acute CHF w/ pulmonary edema  . Hypertension   . Hyperthyroidism    currently no meds -- labs stable  . Nocturia   . Nocturnal oxygen desaturation    uses O2  HS via [place]--  and PRN daytime  . Osteoporosis   . Stroke ([place])   . Type 2 diabetes mellitus ([place])   . Ureteral tumor     Past Surgical History:  Procedure Laterality Date  . ABDOMINAL HYSTERECTOMY  1980'S   W/ BILATERAL SALPINGO-OOPHORECTOMY  . CATARACT EXTRACTION W/PHACO Left 01/31/2014   Procedure: CATARACT EXTRACTION PHACO AND INTRAOCULAR LENS PLACEMENT (IOC);  Surgeon: Tonny Branch, MD;  Location: [facility];  Service: Ophthalmology;  Laterality: Left;  CDE: 23.78  . CATARACT EXTRACTION W/PHACO Right 02/28/2014   Procedure: CATARACT EXTRACTION PHACO AND INTRAOCULAR LENS PLACEMENT (IOC);  Surgeon: Tonny Branch, MD;  Location: [facility];  Service: Ophthalmology;  Laterality: Right;  CDE:  11.34  . CYSTOSCOPY  03/23/2012   Procedure: CYSTOSCOPY;  Surgeon: Claybon Jabs, MD;  [facility];  Service: Urology;  Laterality: N/A;  . CYSTOSCOPY WITH RETROGRADE PYELOGRAM, URETEROSCOPY AND STENT PLACEMENT Right 10/21/2014   Procedure: CYSTOSCOPY WITH RETROGRADE PYELOGRAM, URETEROSCOPY, BLADDER TUMOR BIOPSY;  Surgeon: Claybon Jabs, MD;  Location: [facility];  Service: Urology;  Laterality: Right;  . TRANSTHORACIC ECHOCARDIOGRAM  06-21-2013   moderate LVH,  grade I diastolic dysfunction, ef 23-76%,  mild MR & TR  . TRANSURETHRAL  RESECTION OF BLADDER TUMOR  03/23/2012   Procedure: TRANSURETHRAL RESECTION OF BLADDER TUMOR (TURBT);  Surgeon: Claybon Jabs, MD;  [facility];  Service: Urology;  Laterality: N/A;  . TRANSURETHRAL RESECTION OF BLADDER TUMOR N/A 01/08/2013   Procedure: TRANSURETHRAL RESECTION OF BLADDER TUMOR (TURBT);  Surgeon: Claybon Jabs, MD;  Location: [facility];  Service: Urology;  Laterality: N/A;    Social History   Socioeconomic History  . Marital status: Widowed    Spouse name: Not on file  . Number of children: 4  . Years of education: 10  . Highest education level: 10th grade  Occupational History  . 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 Smoking status: Former Smoker    Packs/day: 0.25    Years: 50.00    Pack years: 12.50    Types: Cigarettes    Quit date: 09/24/2006    Years since quitting: 13.2  . Smokeless tobacco: Never Used  Substance and Sexual Activity  . Alcohol use: No  . Drug use: No  . Sexual activity: Not Currently  Other Topics Concern  . Not on file  Social History Narrative   Occupation: worked in [place] yrs   Social Determinants of Radio broadcast assistant Strain:   . Difficulty of Paying Living Expenses:   Food Insecurity:   . Worried  About Running Out of Food in the Last Year:   . Ray in the Last Year:   Transportation Needs:   . Lack of Transportation (Medical):   [name] Lack of Transportation (Non-Medical):   Physical Activity:   . Days of Exercise per Week:   . Minutes of Exercise per Session:   Stress:   . Feeling of Stress :   Social Connections:   . Frequency of Communication with Friends and Family:   . Frequency of Social Gatherings with Friends and Family:   . Attends Religious Services:   . Active Member of Clubs or Organizations:   . Attends Archivist Meetings:   [name] Marital Status:     Family History  Problem Relation Age of Onset  . Diabetes Father   . Congestive  Heart Failure Father   . COPD Father   . Kidney disease Father   . Hypertension Father   . Diabetes Mother        with kidney disease  . Kidney disease Mother   . Hypertension Mother   . Hyperlipidemia Mother   . Heart attack Mother   . Stroke Mother   . Heart attack Brother   . Asthma Brother   . Diabetes Daughter   . Diabetes Son   . Cancer Brother        throat  . Hypertension Brother     Outpatient Encounter Medications as of 12/25/2019  Medication Sig  . albuterol (PROVENTIL) (2.5 MG/3ML) 0.083% nebulizer solution Inhale 3 mLs into the lungs 4 (four) times daily as needed for shortness of breath.  [name] albuterol (VENTOLIN HFA) 108 (90 Base) MCG/ACT inhaler Inhale 1-2 puffs into the lungs every 6 (six) hours as needed for wheezing or shortness of breath.  [name] alendronate (FOSAMAX) 70 MG tablet Take 1 tablet (70 mg total) by mouth once a week. Take with a full glass of water on an empty stomach.  . carvedilol (COREG) 3.125 MG tablet Take 1 tablet (3.125 mg total) by mouth 2 (two) times daily.  . Cholecalciferol (VITAMIN D3) 2000 UNITS TABS Take 1 tablet by mouth daily.  . cycloSPORINE (RESTASIS MULTIDOSE) 0.05 % ophthalmic emulsion Place 1 drop into both eyes 2 (two) times daily.  . Fluticasone-Salmeterol (ADVAIR DISKUS) 100-50 MCG/DOSE AEPB Inhale 1 puff into the lungs 2 (two) times daily.  . furosemide (LASIX) 40 MG tablet Take 1 tablet (40 mg total) by mouth daily. TAKE 1 TABLET BY MOUTH ONCE DAILY  . metFORMIN (GLUCOPHAGE) 500 MG tablet Take 1 tablet (500 mg total) by mouth daily.  . methimazole (TAPAZOLE) 5 MG tablet Take 1 tablet (5 mg total) by mouth daily.  . potassium chloride SA (KLOR-CON) 20 MEQ tablet Take 1 tablet (20 mEq total) by mouth daily.   No facility-administered encounter medications on file as of 12/25/2019.    ALLERGIES: No Known Allergies  VACCINATION STATUS: Immunization History  Administered Date(s) Administered  . Fluad Quad(high Dose 65+) 10/08/2019   . Influenza, High Dose Seasonal PF 05/25/2017, 05/24/2018  . Influenza,inj,Quad PF,6+ Mos 06/20/2013, 05/30/2014, 06/09/2015, 06/10/2016  . Moderna SARS-COVID-2 Vaccination 11/22/2019, 12/25/2019  . Pneumococcal Conjugate-13 03/07/2015  . Pneumococcal Polysaccharide-23 04/26/2011  . Td 04/26/2011  . Tdap 04/26/2011  . Zoster 04/16/2013     HPI  GURNEET MATARESE is 79 y.o. female who presents today with repeat thyroid function tests.  Her thyroid uptake and scan was not diagnostic due to her inability to lie flat on the scanning  table.  PCP: Terald Sleeper, PA-C.   Elderly patient not a good historian.  Her history is obtained through review of her records and talking to her daughter who accompanied her to clinic.     Her medical records documented suppressed TSH which persisted on repeat test on November 06, 2019 along with free T4 and free T3-labs summarized below.   She reports that she has lost approximately 30 pounds over the last 3 years.  She has intermittent palpitations, denies heat/cold intolerance.  she denies dysphagia, choking, shortness of breath, no recent voice change.    she denies family history of thyroid dysfunction, but denies family hx of thyroid cancer. she denies personal history of goiter. she is not on any anti-thyroid medications nor on any thyroid hormone supplements.                          Review of systems  Constitutional: + weight loss, + fatigue, - subjective hyperthermia Eyes: no blurry vision, - xerophthalmia ENT: no sore throat, no nodules palpated in throat, no dysphagia/odynophagia, nor hoarseness Cardiovascular: no Chest Pain, no Shortness of Breath, +  palpitations, no leg swelling Respiratory: no cough, no SOB Gastrointestinal: no Nausea, no Vomiting, no Diarhhea Musculoskeletal: no muscle/joint aches Skin: no rashes Neurological: +  tremors, no numbness, no tingling, no dizziness Psychiatric: no depression, +  anxiety   Objective:     BP (!) 152/83   Pulse 83   Ht 5\' 6"  (1.676 m)   Wt 191 lb 3.2 oz (86.7 kg)   BMI 30.86 kg/m   Wt Readings from Last 3 Encounters:  12/25/19 191 lb 3.2 oz (86.7 kg)  11/06/19 189 lb 6.4 oz (85.9 kg)  10/08/19 180 lb (81.6 kg)                                                Physical exam   Physical Exam- Limited  Constitutional:  Body mass index is 30.86 kg/m. , not in acute distress, normal state of mind Eyes:  EOMI, no exophthalmos Neck: Supple Thyroid: + gross goiter Respiratory: Adequate breathing efforts Musculoskeletal: no gross deformities, strength intact in all four extremities, no gross restriction of joint movements Skin:  no rashes, no hyperemia Neurological: no tremor with outstretched hands,    CMP     Component Value Date/Time   NA 139 10/08/2019 1146   K 4.6 10/08/2019 1146   CL 101 10/08/2019 1146   CO2 27 10/08/2019 1146   GLUCOSE 73 10/08/2019 1146   GLUCOSE 171 (H) 12/05/2016 0614   BUN 13 10/08/2019 1146   CREATININE 0.85 10/08/2019 1146   CREATININE 0.79 03/22/2013 1707   CALCIUM 9.2 10/08/2019 1146   PROT 7.8 10/08/2019 1146   ALBUMIN 4.0 10/08/2019 1146   AST 16 10/08/2019 1146   ALT 11 10/08/2019 1146   ALKPHOS 72 10/08/2019 1146   BILITOT 0.6 10/08/2019 1146   GFRNONAA 65 10/08/2019 1146   GFRNONAA 74 03/22/2013 1707   GFRAA 75 10/08/2019 1146   GFRAA 86 03/22/2013 1707     CBC    Component Value Date/Time   WBC 4.0 10/08/2019 1146   WBC 5.2 12/02/2016 2255   RBC 5.00 10/08/2019 1146   RBC 4.97 12/02/2016 2255   HGB 13.2 10/08/2019 1146   HCT 41.5 10/08/2019 1146  PLT 161 10/08/2019 1146   MCV 83 10/08/2019 1146   MCH 26.4 (L) 10/08/2019 1146   MCH 28.2 12/02/2016 2255   MCHC 31.8 10/08/2019 1146   MCHC 31.8 12/02/2016 2255   RDW 13.9 10/08/2019 1146   LYMPHSABS 1.0 10/08/2019 1146   MONOABS 0.5 12/02/2016 2255   EOSABS 0.1 10/08/2019 1146   BASOSABS 0.0 10/08/2019 1146     Diabetic Labs (most recent): Lab Results   Component Value Date   HGBA1C 6.5 10/08/2019   HGBA1C 6.2 03/19/2019   HGBA1C 6.0 05/24/2018    Lipid Panel     Component Value Date/Time   CHOL 172 10/08/2019 1146   CHOL 179 03/22/2013 1707   TRIG 36 10/08/2019 1146   TRIG 54 03/22/2013 1707   HDL 95 10/08/2019 1146   HDL 70 03/22/2013 1707   CHOLHDL 1.8 10/08/2019 1146   LDLCALC 69 10/08/2019 1146   LDLCALC 98 03/22/2013 1707   LABVLDL 8 10/08/2019 1146     Lab Results  Component Value Date   TSH 0.04 (L) 11/06/2019   TSH 0.072 (L) 10/08/2019   TSH 0.472 02/21/2017   TSH 0.585 12/14/2015   TSH 0.366 (L) 06/21/2014   TSH 0.019 (L) 06/19/2013   TSH 0.058 (L) 03/22/2013   TSH 0.120 (L) 09/13/2012   TSH 0.118 (L) 09/12/2012   TSH 0.229 (L) 03/21/2012   FREET4 1.3 11/06/2019   FREET4 1.27 06/19/2013   FREET4 1.29 09/13/2012     Thyroid uptake and scan on November 23, 2019 IMPRESSION: Normal 4 hour and 24 hour radio iodine uptakes. Nondiagnostic thyroid imaging, patient unable to lie flat for imaging. Consider ultrasound thyroid gland to assess thyroid morphology, if clinically indicated.   Assessment & Plan:   1. Subclinical hyperthyroidism  2.  -Patient's uptake and scan was not diagnostic due to technical difficulty.  She would benefit from intervention with low-dose methimazole.  I discussed initiated methimazole 5 mg p.o. daily at breakfast with plan to repeat thyroid function test in 3 months. -Regarding her clinical goiter: She is offered baseline thyroid/neck ultrasound.    She is not tachycardic, pulse rate at 83, 74 last visit.  She is on Coreg 3.125 mg p.o. twice daily for hypertension.    -Patient is advised to maintain close follow up with Terald Sleeper, PA-C for primary care needs.     - Time spent on this patient care encounter:  25 minutes of which 50% was spent in  counseling and the rest reviewing  her current and  previous labs / studies and medications  doses and developing a plan for long  term care. Sonya Oliver  participated in the discussions, expressed understanding, and voiced agreement with the above plans.  All questions were answered to her satisfaction. she is encouraged to contact clinic should she have any questions or concerns prior to her return visit.   Follow up plan: Return in about 3 months (around 03/25/2020) for Follow up with Pre-visit Labs.   Thank you for involving me in the care of this pleasant patient, and I will continue to update you with her progress.  Glade Lloyd, MD [facility] Endocrin[facility] Phone: [phone]  Fax: [phone]   12/25/2019, 12:48 PM  This note was partially dictated with voice recognition software. Similar sounding words can be transcribed inadequately or may not  be corrected upon review.

## 2019-12-25 NOTE — Progress Notes (Signed)
   Covid-19 Vaccination Clinic  Name:  Sonya Oliver    MRN: [id number] DOB: [birth year]  12/25/2019  Sonya Oliver was observed post Covid-19 immunization for 15 minutes without incident. She was provided with Vaccine Information Sheet and instruction to access the V-Safe system.   Sonya Oliver was instructed to call 911 with any severe reactions post vaccine: [name] Difficulty breathing  . Swelling of face and throat  . A fast heartbeat  . A bad rash all over body  . Dizziness and weakness   Immunizations Administered    Name Date Dose VIS Date Route   Moderna COVID-19 Vaccine 12/25/2019  9:28 AM 0.5 mL 07/2019 Intramuscular   Manufacturer: Moderna   Lot: [id number]   Sweetser: [id number]

## 2019-12-26 ENCOUNTER — Encounter: Payer: Self-pay | Admitting: "Endocrinology

## 2020-01-07 NOTE — Progress Notes (Signed)
Assessment & Plan:  1. Type 2 diabetes mellitus with complication, without long-term current use of insulin (HCC) Lab Results  Component Value Date   HGBA1C 6.4 01/09/2020   HGBA1C 6.5 10/08/2019   HGBA1C 6.2 03/19/2019    - Diabetes is at goal of A1c < 7. - Medications: continue current medications - Home glucose monitoring: Continue monitoring - Patient is currently taking a statin. Patient is taking an ACE-inhibitor/ARB.  - Last foot exam: 10/08/2019 - Last diabetic eye exam: 12/07/2017 - Urine Microalbumin/Creat Ratio: 10/08/2019 - Instruction/counseling given: reminded to get eye exam - CMP14+EGFR - Bayer DCA Hb A1c Waived - lisinopril (ZESTRIL) 2.5 MG tablet; Take 1 tablet (2.5 mg total) by mouth daily.  Dispense: 30 tablet; Refill: 2 - atorvastatin (LIPITOR) 10 MG tablet; Take 1 tablet (10 mg total) by mouth daily.  Dispense: 30 tablet; Refill: 2  2. Essential hypertension - Uncontrolled.  Patient to monitor blood pressure at home and keep a log.  Started on low-dose lisinopril today.  Education provided on the DASH diet. - lisinopril (ZESTRIL) 2.5 MG tablet; Take 1 tablet (2.5 mg total) by mouth daily.  Dispense: 30 tablet; Refill: 2   Return in about 6 weeks (around 02/20/2020) for HTN.  Hendricks Limes, MSN, APRN, FNP-C [facility]  Subjective:    Patient ID: Sonya Oliver, female    DOB: 1941/05/21, 79 y.o.   MRN: [id number]  Patient Care Team: Loman Brooklyn, FNP as PCP - General (Family Medicine) Kathie Rhodes, MD as Consulting Physician (Urology) Steffanie Rainwater, DPM as Consulting Physician (Podiatry) Melina Schools, OD as Consulting Physician (Optometry) Sinda Du, MD as Consulting Physician (Pulmonary Disease) Jacelyn Pi, MD as Consulting Physician (Endocrinology)   Chief Complaint:  Chief Complaint  Patient presents with  . Establish Care    jones pt  . Diabetes    3 month follow up    HPI: Sonya Oliver is a 79  y.o. female presenting on 01/09/2020 for Establish Care (jones pt) and Diabetes (3 month follow up)  Diabetes: Patient presents for follow up of diabetes. Current symptoms include: none. Known diabetic complications: cardiovascular disease. Medication compliance: yes. Current diet: sometimes eats healthy, sometimes "pigs out". Current exercise: none. Home blood sugar records: BGs are running  consistent with Hgb A1C. Is she  on ACE inhibitor or angiotensin II receptor blocker? No. Is she on a statin? No.   Lab Results  Component Value Date   HGBA1C 6.4 01/09/2020   HGBA1C 6.5 10/08/2019   HGBA1C 6.2 03/19/2019   Lab Results  Component Value Date   MICROALBUR 20 10/25/2014   LDLCALC 69 10/08/2019   CREATININE 0.81 01/09/2020     New complaints: None  Social history:  Relevant past medical, surgical, family and social history reviewed and updated as indicated. Interim medical history since our last visit reviewed.  Allergies and medications reviewed and updated.  DATA REVIEWED: CHART IN EPIC  ROS: Negative unless specifically indicated above in HPI.    Current Outpatient Medications:  .  albuterol (PROVENTIL) (2.5 MG/3ML) 0.083% nebulizer solution, Inhale 3 mLs into the lungs 4 (four) times daily as needed for shortness of breath., Disp: 240 mL, Rfl: 11 .  albuterol (VENTOLIN HFA) 108 (90 Base) MCG/ACT inhaler, Inhale 1-2 puffs into the lungs every 6 (six) hours as needed for wheezing or shortness of breath., Disp: 18 g, Rfl: 12 .  alendronate (FOSAMAX) 70 MG tablet, Take 1 tablet (70 mg total) by mouth  once a week. Take with a full glass of water on an empty stomach., Disp: 4 tablet, Rfl: 11 .  carvedilol (COREG) 3.125 MG tablet, Take 1 tablet (3.125 mg total) by mouth 2 (two) times daily., Disp: 180 tablet, Rfl: 3 .  Cholecalciferol (VITAMIN D3) 2000 UNITS TABS, Take 1 tablet by mouth daily., Disp: , Rfl:  .  cycloSPORINE (RESTASIS MULTIDOSE) 0.05 % ophthalmic emulsion, Place 1  drop into both eyes 2 (two) times daily., Disp: 5.5 mL, Rfl: 11 .  Fluticasone-Salmeterol (ADVAIR DISKUS) 100-50 MCG/DOSE AEPB, Inhale 1 puff into the lungs 2 (two) times daily., Disp: 60 each, Rfl: 11 .  furosemide (LASIX) 40 MG tablet, Take 1 tablet (40 mg total) by mouth daily. TAKE 1 TABLET BY MOUTH ONCE DAILY, Disp: 30 tablet, Rfl: 11 .  metFORMIN (GLUCOPHAGE) 500 MG tablet, Take 1 tablet (500 mg total) by mouth daily., Disp: 90 tablet, Rfl: 1 .  methimazole (TAPAZOLE) 5 MG tablet, Take 1 tablet (5 mg total) by mouth daily., Disp: 30 tablet, Rfl: 3 .  potassium chloride SA (KLOR-CON) 20 MEQ tablet, Take 1 tablet (20 mEq total) by mouth daily., Disp: 30 tablet, Rfl: 11 .  atorvastatin (LIPITOR) 10 MG tablet, Take 1 tablet (10 mg total) by mouth daily., Disp: 30 tablet, Rfl: 2 .  lisinopril (ZESTRIL) 2.5 MG tablet, Take 1 tablet (2.5 mg total) by mouth daily., Disp: 30 tablet, Rfl: 2   No Known Allergies Past Medical History:  Diagnosis Date  . Arthritis   . Bladder tumor   . Cancer ([place]) 2015   bladder  . Cataract   . CHF (congestive heart failure) ([place])    NO CARDIOLOGIST---  MONITORED BY PCP DR Jacelyn Grip  . COPD with emphysema ([place])    last Acute Exacerbation 08-21-2014  . Dyspnea on exertion   . H/O pulmonary edema    jan 2014--  acute CHF w/ pulmonary edema  . Hypertension   . Hyperthyroidism    currently no meds -- labs stable  . Nocturia   . Nocturnal oxygen desaturation    uses O2  HS via [place]--  and PRN daytime  . Osteoporosis   . Stroke ([place])   . Type 2 diabetes mellitus ([place])   . Ureteral tumor     Past Surgical History:  Procedure Laterality Date  . ABDOMINAL HYSTERECTOMY  1980'S   W/ BILATERAL SALPINGO-OOPHORECTOMY  . CATARACT EXTRACTION W/PHACO Left 01/31/2014   Procedure: CATARACT EXTRACTION PHACO AND INTRAOCULAR LENS PLACEMENT (IOC);  Surgeon: Tonny Branch, MD;  Location: [facility];  Service: Ophthalmology;  Laterality: Left;  CDE: 23.78  . CATARACT EXTRACTION W/PHACO  Right 02/28/2014   Procedure: CATARACT EXTRACTION PHACO AND INTRAOCULAR LENS PLACEMENT (IOC);  Surgeon: Tonny Branch, MD;  [facility];  Service: Ophthalmology;  Laterality: Right;  CDE:  11.34  . CYSTOSCOPY  03/23/2012   Procedure: CYSTOSCOPY;  Surgeon: Claybon Jabs, MD;  [facility];  Service: Urology;  Laterality: N/A;  . CYSTOSCOPY WITH RETROGRADE PYELOGRAM, URETEROSCOPY AND STENT PLACEMENT Right 10/21/2014   Procedure: CYSTOSCOPY WITH RETROGRADE PYELOGRAM, URETEROSCOPY, BLADDER TUMOR BIOPSY;  Surgeon: Claybon Jabs, MD;  Location: [facility];  Service: Urology;  Laterality: Right;  . TRANSTHORACIC ECHOCARDIOGRAM  06-21-2013   moderate LVH,  grade I diastolic dysfunction, ef 44-81%,  mild MR & TR  . TRANSURETHRAL RESECTION OF BLADDER TUMOR  03/23/2012   Procedure: TRANSURETHRAL RESECTION OF BLADDER TUMOR (TURBT);  Surgeon: Claybon Jabs, MD;  [facility];  Service: Urology;  Laterality: N/A;  . TRANSURETHRAL RESECTION OF BLADDER TUMOR N/A 01/08/2013   Procedure: TRANSURETHRAL RESECTION OF BLADDER TUMOR (TURBT);  Surgeon: Claybon Jabs, MD;  Location: [facility];  Service: Urology;  Laterality: N/A;    Social History   Socioeconomic History  . Marital status: Widowed    Spouse name: Not on file  . Number of children: 4  . Years of education: 10  . Highest education level: 10th grade  Occupational History  . 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 Smoking status: Former Smoker    Packs/day: 0.25    Years: 50.00    Pack years: 12.50    Types: Cigarettes    Quit date: 09/24/2006    Years since quitting: 13.3  . Smokeless tobacco: Never Used  Substance and Sexual Activity  . Alcohol use: No  . Drug use: No  . Sexual activity: Not Currently  Other Topics Concern  . Not on file  Social History Narrative   Occupation: worked in [place] yrs   Social Determinants of Radio broadcast assistant Strain:   .  Difficulty of Paying Living Expenses:   Food Insecurity:   . Worried About Charity fundraiser in the Last Year:   . Arboriculturist in the Last Year:   Transportation Needs:   . Film/video editor (Medical):   [name] Lack of Transportation (Non-Medical):   Physical Activity:   . Days of Exercise per Week:   . Minutes of Exercise per Session:   Stress:   . Feeling of Stress :   Social Connections:   . Frequency of Communication with Friends and Family:   . Frequency of Social Gatherings with Friends and Family:   . Attends Religious Services:   . Active Member of Clubs or Organizations:   . Attends Archivist Meetings:   [name] Marital Status:   Intimate Partner Violence:   . Fear of Current or Ex-Partner:   . Emotionally Abused:   [name] Physically Abused:   . Sexually Abused:         Objective:    BP (!) 176/73   Pulse 71   Temp 98 F (36.7 C) (Temporal)   Ht _0  (1.676 m)   Wt 192 lb 9.6 oz (87.4 kg)   SpO2 91%   BMI 31.09 kg/m   Wt Readings from Last 3 Encounters:  01/09/20 192 lb 9.6 oz (87.4 kg)  12/25/19 191 lb 3.2 oz (86.7 kg)  11/06/19 189 lb 6.4 oz (85.9 kg)    Physical Exam Vitals reviewed.  Constitutional:      General: She is not in acute distress.    Appearance: Normal appearance. She is obese. She is not ill-appearing, toxic-appearing or diaphoretic.  HENT:     Head: Normocephalic and atraumatic.  Eyes:     General: No scleral icterus.       Right eye: No discharge.        Left eye: No discharge.     Conjunctiva/sclera: Conjunctivae normal.  Cardiovascular:     Rate and Rhythm: Normal rate and regular rhythm.     Heart sounds: Normal heart sounds. No murmur. No friction rub. No gallop.   Pulmonary:     Effort: Pulmonary effort is normal. No respiratory distress.     Breath sounds: Normal breath sounds. No stridor. No wheezing, rhonchi or rales.  Musculoskeletal:        General:  Normal range of motion.     Cervical back: Normal range of  motion.  Skin:    General: Skin is warm and dry.     Capillary Refill: Capillary refill takes less than 2 seconds.  Neurological:     General: No focal deficit present.     Mental Status: She is alert and oriented to person, place, and time. Mental status is at baseline.     Gait: Gait abnormal (ambulates with walker).  Psychiatric:        Mood and Affect: Mood normal.        Behavior: Behavior normal.        Thought Content: Thought content normal.        Judgment: Judgment normal.     Lab Results  Component Value Date   TSH 0.04 (L) 11/06/2019   Lab Results  Component Value Date   WBC 4.0 10/08/2019   HGB 13.2 10/08/2019   HCT 41.5 10/08/2019   MCV 83 10/08/2019   PLT 161 10/08/2019   Lab Results  Component Value Date   NA 143 01/09/2020   K 4.1 01/09/2020   CO2 25 01/09/2020   GLUCOSE 86 01/09/2020   BUN 17 01/09/2020   CREATININE 0.81 01/09/2020   BILITOT 0.5 01/09/2020   ALKPHOS 93 01/09/2020   AST 20 01/09/2020   ALT 12 01/09/2020   PROT 7.9 01/09/2020   ALBUMIN 3.9 01/09/2020   CALCIUM 9.3 01/09/2020   ANIONGAP 10 12/05/2016   Lab Results  Component Value Date   CHOL 172 10/08/2019   Lab Results  Component Value Date   HDL 95 10/08/2019   Lab Results  Component Value Date   LDLCALC 69 10/08/2019   Lab Results  Component Value Date   TRIG 36 10/08/2019   Lab Results  Component Value Date   CHOLHDL 1.8 10/08/2019   Lab Results  Component Value Date   HGBA1C 6.4 01/09/2020

## 2020-01-09 ENCOUNTER — Ambulatory Visit: Payer: Medicare Other | Admitting: Physician Assistant

## 2020-01-09 ENCOUNTER — Other Ambulatory Visit: Payer: Self-pay

## 2020-01-09 ENCOUNTER — Encounter: Payer: Self-pay | Admitting: Family Medicine

## 2020-01-09 ENCOUNTER — Ambulatory Visit (INDEPENDENT_AMBULATORY_CARE_PROVIDER_SITE_OTHER): Payer: Medicare Other | Admitting: Family Medicine

## 2020-01-09 VITALS — BP 176/73 | HR 71 | Temp 98.0°F | Ht 66.0 in | Wt 192.6 lb

## 2020-01-09 DIAGNOSIS — E118 Type 2 diabetes mellitus with unspecified complications: Secondary | ICD-10-CM

## 2020-01-09 DIAGNOSIS — I10 Essential (primary) hypertension: Secondary | ICD-10-CM | POA: Diagnosis not present

## 2020-01-09 LAB — CMP14+EGFR
ALT: 12 IU/L (ref 0–32)
AST: 20 IU/L (ref 0–40)
Albumin/Globulin Ratio: 1 — ABNORMAL LOW (ref 1.2–2.2)
Albumin: 3.9 g/dL (ref 3.7–4.7)
Alkaline Phosphatase: 93 IU/L (ref 39–117)
BUN/Creatinine Ratio: 21 (ref 12–28)
BUN: 17 mg/dL (ref 8–27)
Bilirubin Total: 0.5 mg/dL (ref 0.0–1.2)
CO2: 25 mmol/L (ref 20–29)
Calcium: 9.3 mg/dL (ref 8.7–10.3)
Chloride: 104 mmol/L (ref 96–106)
Creatinine, Ser: 0.81 mg/dL (ref 0.57–1.00)
GFR calc Af Amer: 79 mL/min/{1.73_m2} (ref >59)
GFR calc non Af Amer: 69 mL/min/{1.73_m2} (ref >59)
Globulin, Total: 4 g/dL (ref 1.5–4.5)
Glucose: 86 mg/dL (ref 65–99)
Potassium: 4.1 mmol/L (ref 3.5–5.2)
Sodium: 143 mmol/L (ref 134–144)
Total Protein: 7.9 g/dL (ref 6.0–8.5)

## 2020-01-09 LAB — BAYER DCA HB A1C WAIVED: HB A1C (BAYER DCA - WAIVED): 6.4 % (ref <7.0)

## 2020-01-09 MED ORDER — ATORVASTATIN CALCIUM 10 MG PO TABS: 10.0000 mg | ORAL_TABLET | Freq: Every day | ORAL | 2 refills | Status: DC

## 2020-01-09 MED ORDER — LISINOPRIL 2.5 MG PO TABS: 2.5000 mg | ORAL_TABLET | Freq: Every day | ORAL | 2 refills | Status: DC

## 2020-01-09 NOTE — Patient Instructions (Signed)
DASH Eating Plan DASH stands for "Dietary Approaches to Stop Hypertension." The DASH eating plan is a healthy eating plan that has been shown to reduce high blood pressure (hypertension). It may also reduce your risk for type 2 diabetes, heart disease, and stroke. The DASH eating plan may also help with weight loss. What are tips for following this plan?  General guidelines  Avoid eating more than 2,300 mg (milligrams) of salt (sodium) a day. If you have hypertension, you may need to reduce your sodium intake to 1,500 mg a day.  Limit alcohol intake to no more than 1 drink a day for nonpregnant women and 2 drinks a day for men. One drink equals 12 oz of beer, 5 oz of wine, or 1 oz of hard liquor.  Work with your health care provider to maintain a healthy body weight or to lose weight. Ask what an ideal weight is for you.  Get at least 30 minutes of exercise that causes your heart to beat faster (aerobic exercise) most days of the week. Activities may include walking, swimming, or biking.  Work with your health care provider or diet and nutrition specialist (dietitian) to adjust your eating plan to your individual calorie needs. Reading food labels   Check food labels for the amount of sodium per serving. Choose foods with less than 5 percent of the Daily Value of sodium. Generally, foods with less than 300 mg of sodium per serving fit into this eating plan.  To find whole grains, look for the word "whole" as the first word in the ingredient list. Shopping  Buy products labeled as "low-sodium" or "no salt added."  Buy fresh foods. Avoid canned foods and premade or frozen meals. Cooking  Avoid adding salt when cooking. Use salt-free seasonings or herbs instead of table salt or sea salt. Check with your health care provider or pharmacist before using salt substitutes.  Do not fry foods. Cook foods using healthy methods such as baking, boiling, grilling, and broiling instead.  Cook with  heart-healthy oils, such as olive, canola, soybean, or sunflower oil. Meal planning  Eat a balanced diet that includes: ? 5 or more servings of fruits and vegetables each day. At each meal, try to fill half of your plate with fruits and vegetables. ? Up to 6-8 servings of whole grains each day. ? Less than 6 oz of lean meat, poultry, or fish each day. A 3-oz serving of meat is about the same size as a deck of cards. One egg equals 1 oz. ? 2 servings of low-fat dairy each day. ? A serving of nuts, seeds, or beans 5 times each week. ? Heart-healthy fats. Healthy fats called Omega-3 fatty acids are found in foods such as flaxseeds and coldwater fish, like sardines, salmon, and mackerel.  Limit how much you eat of the following: ? Canned or prepackaged foods. ? Food that is high in trans fat, such as fried foods. ? Food that is high in saturated fat, such as fatty meat. ? Sweets, desserts, sugary drinks, and other foods with added sugar. ? Full-fat dairy products.  Do not salt foods before eating.  Try to eat at least 2 vegetarian meals each week.  Eat more home-cooked food and less restaurant, buffet, and fast food.  When eating at a restaurant, ask that your food be prepared with less salt or no salt, if possible. What foods are recommended? The items listed may not be a complete list. Talk with your dietitian about   what dietary choices are best for you. Grains Whole-grain or whole-wheat bread. Whole-grain or whole-wheat pasta. Brown rice. Oatmeal. Quinoa. Bulgur. Whole-grain and low-sodium cereals. Pita bread. Low-fat, low-sodium crackers. Whole-wheat flour tortillas. Vegetables Fresh or frozen vegetables (raw, steamed, roasted, or grilled). Low-sodium or reduced-sodium tomato and vegetable juice. Low-sodium or reduced-sodium tomato sauce and tomato paste. Low-sodium or reduced-sodium canned vegetables. Fruits All fresh, dried, or frozen fruit. Canned fruit in natural juice (without  added sugar). Meat and other protein foods Skinless chicken or turkey. Ground chicken or turkey. Pork with fat trimmed off. Fish and seafood. Egg whites. Dried beans, peas, or lentils. Unsalted nuts, nut butters, and seeds. Unsalted canned beans. Lean cuts of beef with fat trimmed off. Low-sodium, lean deli meat. Dairy Low-fat (1%) or fat-free (skim) milk. Fat-free, low-fat, or reduced-fat cheeses. Nonfat, low-sodium ricotta or cottage cheese. Low-fat or nonfat yogurt. Low-fat, low-sodium cheese. Fats and oils Soft margarine without trans fats. Vegetable oil. Low-fat, reduced-fat, or light mayonnaise and salad dressings (reduced-sodium). Canola, safflower, olive, soybean, and sunflower oils. Avocado. Seasoning and other foods Herbs. Spices. Seasoning mixes without salt. Unsalted popcorn and pretzels. Fat-free sweets. What foods are not recommended? The items listed may not be a complete list. Talk with your dietitian about what dietary choices are best for you. Grains Baked goods made with fat, such as croissants, muffins, or some breads. Dry pasta or rice meal packs. Vegetables Creamed or fried vegetables. Vegetables in a cheese sauce. Regular canned vegetables (not low-sodium or reduced-sodium). Regular canned tomato sauce and paste (not low-sodium or reduced-sodium). Regular tomato and vegetable juice (not low-sodium or reduced-sodium). Pickles. Olives. Fruits Canned fruit in a light or heavy syrup. Fried fruit. Fruit in cream or butter sauce. Meat and other protein foods Fatty cuts of meat. Ribs. Fried meat. Bacon. Sausage. Bologna and other processed lunch meats. Salami. Fatback. Hotdogs. Bratwurst. Salted nuts and seeds. Canned beans with added salt. Canned or smoked fish. Whole eggs or egg yolks. Chicken or turkey with skin. Dairy Whole or 2% milk, cream, and half-and-half. Whole or full-fat cream cheese. Whole-fat or sweetened yogurt. Full-fat cheese. Nondairy creamers. Whipped toppings.  Processed cheese and cheese spreads. Fats and oils Butter. Stick margarine. Lard. Shortening. Ghee. Bacon fat. Tropical oils, such as coconut, palm kernel, or palm oil. Seasoning and other foods Salted popcorn and pretzels. Onion salt, garlic salt, seasoned salt, table salt, and sea salt. Worcestershire sauce. Tartar sauce. Barbecue sauce. Teriyaki sauce. Soy sauce, including reduced-sodium. Steak sauce. Canned and packaged gravies. Fish sauce. Oyster sauce. Cocktail sauce. Horseradish that you find on the shelf. Ketchup. Mustard. Meat flavorings and tenderizers. Bouillon cubes. Hot sauce and Tabasco sauce. Premade or packaged marinades. Premade or packaged taco seasonings. Relishes. Regular salad dressings. Where to find more information:  National Heart, Lung, and Blood Institute: www.nhlbi.nih.gov  American Heart Association: www.heart.org Summary  The DASH eating plan is a healthy eating plan that has been shown to reduce high blood pressure (hypertension). It may also reduce your risk for type 2 diabetes, heart disease, and stroke.  With the DASH eating plan, you should limit salt (sodium) intake to 2,300 mg a day. If you have hypertension, you may need to reduce your sodium intake to 1,500 mg a day.  When on the DASH eating plan, aim to eat more fresh fruits and vegetables, whole grains, lean proteins, low-fat dairy, and heart-healthy fats.  Work with your health care provider or diet and nutrition specialist (dietitian) to adjust your eating plan to your   individual calorie needs. This information is not intended to replace advice given to you by your health care provider. Make sure you discuss any questions you have with your health care provider. Document Revised: 07/29/2017 Document Reviewed: 08/09/2016 Elsevier Patient Education  2020 Elsevier Inc.  

## 2020-01-12 ENCOUNTER — Encounter: Payer: Self-pay | Admitting: Family Medicine

## 2020-02-11 ENCOUNTER — Ambulatory Visit (HOSPITAL_COMMUNITY): Payer: Medicare Other

## 2020-02-19 ENCOUNTER — Ambulatory Visit: Payer: Medicare Other | Admitting: Family Medicine

## 2020-02-28 ENCOUNTER — Other Ambulatory Visit: Payer: Self-pay | Admitting: *Deleted

## 2020-02-28 DIAGNOSIS — J441 Chronic obstructive pulmonary disease with (acute) exacerbation: Secondary | ICD-10-CM

## 2020-02-28 MED ORDER — ALBUTEROL SULFATE HFA 108 (90 BASE) MCG/ACT IN AERS
1.0000 | INHALATION_SPRAY | Freq: Four times a day (QID) | RESPIRATORY_TRACT | 2 refills | Status: DC | PRN
Start: 2020-02-28 — End: 2020-04-28

## 2020-03-12 ENCOUNTER — Ambulatory Visit (HOSPITAL_COMMUNITY): Payer: Medicare Other

## 2020-03-13 ENCOUNTER — Other Ambulatory Visit: Payer: Self-pay

## 2020-03-13 ENCOUNTER — Ambulatory Visit (HOSPITAL_COMMUNITY)
Admission: RE | Admit: 2020-03-13 | Discharge: 2020-03-13 | Disposition: A | Discharge status: Home / Self Care | Payer: Medicare Other | Source: Ambulatory Visit | Attending: "Endocrinology | Admitting: "Endocrinology

## 2020-03-13 DIAGNOSIS — E0590 Thyrotoxicosis, unspecified without thyrotoxic crisis or storm: Secondary | ICD-10-CM | POA: Diagnosis present

## 2020-03-14 ENCOUNTER — Ambulatory Visit (INDEPENDENT_AMBULATORY_CARE_PROVIDER_SITE_OTHER): Payer: Medicare Other | Admitting: Family Medicine

## 2020-03-14 ENCOUNTER — Encounter: Payer: Self-pay | Admitting: Family Medicine

## 2020-03-14 VITALS — BP 158/75 | HR 80 | Temp 97.5°F | Ht 66.0 in | Wt 189.6 lb

## 2020-03-14 DIAGNOSIS — I10 Essential (primary) hypertension: Secondary | ICD-10-CM | POA: Diagnosis not present

## 2020-03-14 LAB — T3, FREE: T3, Free: 3 pg/mL (ref 2.3–4.2)

## 2020-03-14 LAB — TSH: TSH: 0.14 mIU/L — ABNORMAL LOW (ref 0.40–4.50)

## 2020-03-14 LAB — T4, FREE: Free T4: 1.1 ng/dL (ref 0.8–1.8)

## 2020-03-14 MED ORDER — LISINOPRIL 5 MG PO TABS: 5.0000 mg | ORAL_TABLET | Freq: Every day | ORAL | 2 refills | Status: DC

## 2020-03-14 NOTE — Progress Notes (Signed)
Assessment & Plan:  1. Essential hypertension - Improving.  Lisinopril increased from 2.5 mg up to 5 mg once daily. - lisinopril (ZESTRIL) 5 MG tablet; Take 1 tablet (5 mg total) by mouth daily.  Dispense: 30 tablet; Refill: 2   Return in about 2 months (around 05/15/2020) for follow-up of chronic medication conditions.  Hendricks Limes, MSN, APRN, FNP-C [facility]  Subjective:    Patient ID: Sonya Oliver, female    DOB: 1941/02/24, 79 y.o.   MRN: [id number]  Patient Care Team: Loman Brooklyn, FNP as PCP - General (Family Medicine) Kathie Rhodes, MD as Consulting Physician (Urology) Steffanie Rainwater, DPM as Consulting Physician (Podiatry) Melina Schools, OD as Consulting Physician (Optometry) Sinda Du, MD as Consulting Physician (Pulmonary Disease) Jacelyn Pi, MD as Consulting Physician (Endocrinology)   Chief Complaint:  Chief Complaint  Patient presents with   Hypertension    6 week follow up     HPI: Sonya Oliver is a 79 y.o. female presenting on 03/14/2020 for Hypertension (6 week follow up )  Patient is here for follow-up of her blood pressure.  She was started on lisinopril 2.5 mg once daily at her last visit.  She has not been checking her blood pressure at home.  New complaints: None  Social history:  Relevant past medical, surgical, family and social history reviewed and updated as indicated. Interim medical history since our last visit reviewed.  Allergies and medications reviewed and updated.  DATA REVIEWED: CHART IN EPIC  ROS: Negative unless specifically indicated above in HPI.    Current Outpatient Medications:    albuterol (PROVENTIL) (2.5 MG/3ML) 0.083% nebulizer solution, Inhale 3 mLs into the lungs 4 (four) times daily as needed for shortness of breath., Disp: 240 mL, Rfl: 11   albuterol (VENTOLIN HFA) 108 (90 Base) MCG/ACT inhaler, Inhale 1-2 puffs into the lungs every 6 (six) hours as needed for wheezing  or shortness of breath., Disp: 18 g, Rfl: 2   alendronate (FOSAMAX) 70 MG tablet, Take 1 tablet (70 mg total) by mouth once a week. Take with a full glass of water on an empty stomach., Disp: 4 tablet, Rfl: 11   atorvastatin (LIPITOR) 10 MG tablet, Take 1 tablet (10 mg total) by mouth daily., Disp: 30 tablet, Rfl: 2   carvedilol (COREG) 3.125 MG tablet, Take 1 tablet (3.125 mg total) by mouth 2 (two) times daily., Disp: 180 tablet, Rfl: 3   Cholecalciferol (VITAMIN D3) 2000 UNITS TABS, Take 1 tablet by mouth daily., Disp: , Rfl:    cycloSPORINE (RESTASIS MULTIDOSE) 0.05 % ophthalmic emulsion, Place 1 drop into both eyes 2 (two) times daily., Disp: 5.5 mL, Rfl: 11   Fluticasone-Salmeterol (ADVAIR DISKUS) 100-50 MCG/DOSE AEPB, Inhale 1 puff into the lungs 2 (two) times daily., Disp: 60 each, Rfl: 11   furosemide (LASIX) 40 MG tablet, Take 1 tablet (40 mg total) by mouth daily. TAKE 1 TABLET BY MOUTH ONCE DAILY, Disp: 30 tablet, Rfl: 11   lisinopril (ZESTRIL) 2.5 MG tablet, Take 1 tablet (2.5 mg total) by mouth daily., Disp: 30 tablet, Rfl: 2   metFORMIN (GLUCOPHAGE) 500 MG tablet, Take 1 tablet (500 mg total) by mouth daily., Disp: 90 tablet, Rfl: 1   methimazole (TAPAZOLE) 5 MG tablet, Take 1 tablet (5 mg total) by mouth daily., Disp: 30 tablet, Rfl: 3   potassium chloride SA (KLOR-CON) 20 MEQ tablet, Take 1 tablet (20 mEq total) by mouth daily., Disp: 30 tablet, Rfl: 11  No Known Allergies Past Medical History:  Diagnosis Date   Arthritis    Bladder tumor    Cancer ([place]) 2015   bladder   Cataract    CHF (congestive heart failure) ([place])    NO CARDIOLOGIST---  MONITORED BY PCP DR Jacelyn Grip   COPD with emphysema ([place])    last Acute Exacerbation 08-21-2014   Dyspnea on exertion    H/O pulmonary edema    jan 2014--  acute CHF w/ pulmonary edema   Hypertension    Hyperthyroidism    currently no meds -- labs stable   Nocturia    Nocturnal oxygen desaturation    uses O2   HS via [place]--  and PRN daytime   Osteoporosis    Stroke ([place])    Type 2 diabetes mellitus ([place])    Ureteral tumor     Past Surgical History:  Procedure Laterality Date   ABDOMINAL HYSTERECTOMY  1980'S   W/ BILATERAL SALPINGO-OOPHORECTOMY   CATARACT EXTRACTION W/PHACO Left 01/31/2014   Procedure: CATARACT EXTRACTION PHACO AND INTRAOCULAR LENS PLACEMENT ([place]);  Surgeon: Tonny Branch, MD;  Location: [facility];  Service: Ophthalmology;  Laterality: Left;  CDE: 23.78   CATARACT EXTRACTION W/PHACO Right 02/28/2014   Procedure: CATARACT EXTRACTION PHACO AND INTRAOCULAR LENS PLACEMENT ([facility]);  Surgeon: Tonny Branch, MD;  Location: [facility];  Service: Ophthalmology;  Laterality: Right;  CDE:  11.34   CYSTOSCOPY  03/23/2012   Procedure: CYSTOSCOPY;  Surgeon: Claybon Jabs, MD;  Location: [facility];  Service: Urology;  Laterality: N/A;   CYSTOSCOPY WITH RETROGRADE PYELOGRAM, URETEROSCOPY AND STENT PLACEMENT Right 10/21/2014   Procedure: CYSTOSCOPY WITH RETROGRADE PYELOGRAM, URETEROSCOPY, BLADDER TUMOR BIOPSY;  Surgeon: Claybon Jabs, MD;  Location: [facility];  Service: Urology;  Laterality: Right;   TRANSTHORACIC ECHOCARDIOGRAM  06-21-2013   moderate LVH,  grade I diastolic dysfunction, ef 67-12%,  mild MR & TR   TRANSURETHRAL RESECTION OF BLADDER TUMOR  03/23/2012   Procedure: TRANSURETHRAL RESECTION OF BLADDER TUMOR (TURBT);  Surgeon: Claybon Jabs, MD;  Location: [facility];  Service: Urology;  Laterality: N/A;   TRANSURETHRAL RESECTION OF BLADDER TUMOR N/A 01/08/2013   Procedure: TRANSURETHRAL RESECTION OF BLADDER TUMOR (TURBT);  Surgeon: Claybon Jabs, MD;  Location: 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    Tobacco Use   Smoking status: Former Smoker    Packs/day: 0.25    Years: 50.00    Pack years: 12.50    Types: Cigarettes    Quit date: 09/24/2006    Years since quitting: 13.4   Smokeless tobacco: Never Used  Vaping Use   Vaping Use: Never used  Substance and Sexual Activity   Alcohol use: No   Drug use: No   Sexual activity: Not Currently  Other Topics Concern   Not on file  Social History Narrative   Occupation: worked in [place] yrs   Social Determinants of Radio broadcast assistant Strain:    Difficulty of Paying Living Expenses:   Food Insecurity:    Worried About Charity fundraiser in the Last Year:    Arboriculturist in the Last Year:   Transportation Needs:  Lack of Transportation (Medical):    Lack of Transportation (Non-Medical):   Physical Activity:    Days of Exercise per Week:    Minutes of Exercise per Session:   Stress:    Feeling of Stress :   Social Connections:    Frequency of Communication with Friends and Family:    Frequency of Social Gatherings with Friends and Family:    Attends Religious Services:    Active Member of Clubs or Organizations:    Attends Archivist Meetings:    Marital Status:   Intimate Partner Violence:    Fear of Current or Ex-Partner:    Emotionally Abused:    Physically Abused:    Sexually Abused:         Objective:    BP (!) 158/75    Pulse 80    Temp (!) 97.5 F (36.4 C) (Temporal)    Ht 5\' 6"  (1.676 m)    Wt 189 lb 9.6 oz (86 kg)    SpO2 (!) 89%    BMI 30.60 kg/m   Wt Readings from Last 3 Encounters:  03/14/20 189 lb 9.6 oz (86 kg)  01/09/20 192 lb 9.6 oz (87.4 kg)  12/25/19 191 lb 3.2 oz (86.7 kg)    Physical Exam Vitals reviewed.  Constitutional:      General: She is not in acute distress.    Appearance: Normal appearance. She is obese. She is not ill-appearing, toxic-appearing or diaphoretic.  HENT:     Head: Normocephalic and atraumatic.  Eyes:      General: No scleral icterus.       Right eye: No discharge.        Left eye: No discharge.     Conjunctiva/sclera: Conjunctivae normal.  Cardiovascular:     Rate and Rhythm: Normal rate and regular rhythm.     Heart sounds: Normal heart sounds. No murmur heard.  No friction rub. No gallop.   Pulmonary:     Effort: Pulmonary effort is normal. No respiratory distress.     Breath sounds: Normal breath sounds. No stridor. No wheezing, rhonchi or rales.  Musculoskeletal:        General: Normal range of motion.     Cervical back: Normal range of motion.  Skin:    General: Skin is warm and dry.     Capillary Refill: Capillary refill takes less than 2 seconds.  Neurological:     General: No focal deficit present.     Mental Status: She is alert and oriented to person, place, and time. Mental status is at baseline.     Gait: Gait abnormal (ambulating with rolling walker).  Psychiatric:        Mood and Affect: Mood normal.        Behavior: Behavior normal.        Thought Content: Thought content normal.        Judgment: Judgment normal.     Lab Results  Component Value Date   TSH 0.14 (L) 03/13/2020   Lab Results  Component Value Date   WBC 4.0 10/08/2019   HGB 13.2 10/08/2019   HCT 41.5 10/08/2019   MCV 83 10/08/2019   PLT 161 10/08/2019   Lab Results  Component Value Date   NA 143 01/09/2020   K 4.1 01/09/2020   CO2 25 01/09/2020   GLUCOSE 86 01/09/2020   BUN 17 01/09/2020   CREATININE 0.81 01/09/2020   BILITOT 0.5 01/09/2020   ALKPHOS 93 01/09/2020   AST  20 01/09/2020   ALT 12 01/09/2020   PROT 7.9 01/09/2020   ALBUMIN 3.9 01/09/2020   CALCIUM 9.3 01/09/2020   ANIONGAP 10 12/05/2016   Lab Results  Component Value Date   CHOL 172 10/08/2019   Lab Results  Component Value Date   HDL 95 10/08/2019   Lab Results  Component Value Date   LDLCALC 69 10/08/2019   Lab Results  Component Value Date   TRIG 36 10/08/2019   Lab Results  Component Value Date    CHOLHDL 1.8 10/08/2019   Lab Results  Component Value Date   HGBA1C 6.4 01/09/2020

## 2020-03-17 ENCOUNTER — Other Ambulatory Visit: Payer: Self-pay | Admitting: "Endocrinology

## 2020-03-21 ENCOUNTER — Ambulatory Visit (INDEPENDENT_AMBULATORY_CARE_PROVIDER_SITE_OTHER): Payer: Medicare Other | Admitting: "Endocrinology

## 2020-03-21 ENCOUNTER — Other Ambulatory Visit: Payer: Self-pay

## 2020-03-21 ENCOUNTER — Encounter: Payer: Self-pay | Admitting: "Endocrinology

## 2020-03-21 VITALS — BP 148/68 | HR 76 | Ht 66.0 in | Wt 190.0 lb

## 2020-03-21 DIAGNOSIS — E0590 Thyrotoxicosis, unspecified without thyrotoxic crisis or storm: Secondary | ICD-10-CM

## 2020-03-21 MED ORDER — METHIMAZOLE 5 MG PO TABS: 5.0000 mg | ORAL_TABLET | Freq: Every day | ORAL | 1 refills | Status: DC

## 2020-03-21 NOTE — Progress Notes (Signed)
03/21/2020         Endocrinology follow-up note   Subjective:    Patient ID: Sonya Oliver, female    DOB: 1940/09/06, PCP Loman Brooklyn, FNP.   Past Medical History:  Diagnosis Date  . Arthritis   . Bladder tumor   . Cancer ([place]) 2015   bladder  . Cataract   . CHF (congestive heart failure) ([place])    NO CARDIOLOGIST---  MONITORED BY PCP DR Jacelyn Grip  . COPD with emphysema ([place])    last Acute Exacerbation 08-21-2014  . Dyspnea on exertion   . H/O pulmonary edema    jan 2014--  acute CHF w/ pulmonary edema  . Hypertension   . Hyperthyroidism    currently no meds -- labs stable  . Nocturia   . Nocturnal oxygen desaturation    uses O2  HS via [place]--  and PRN daytime  . Osteoporosis   . Stroke ([place])   . Type 2 diabetes mellitus ([place])   . Ureteral tumor     Past Surgical History:  Procedure Laterality Date  . ABDOMINAL HYSTERECTOMY  1980'S   W/ BILATERAL SALPINGO-OOPHORECTOMY  . CATARACT EXTRACTION W/PHACO Left 01/31/2014   Procedure: CATARACT EXTRACTION PHACO AND INTRAOCULAR LENS PLACEMENT (IOC);  Surgeon: Tonny Branch, MD;  Location: [facility];  Service: Ophthalmology;  Laterality: Left;  CDE: 23.78  . CATARACT EXTRACTION W/PHACO Right 02/28/2014   Procedure: CATARACT EXTRACTION PHACO AND INTRAOCULAR LENS PLACEMENT (IOC);  Surgeon: Tonny Branch, MD;  Location: [facility];  Service: Ophthalmology;  Laterality: Right;  CDE:  11.34  . CYSTOSCOPY  03/23/2012   Procedure: CYSTOSCOPY;  Surgeon: Claybon Jabs, MD;  Location: [facility];  Service: Urology;  Laterality: N/A;  . CYSTOSCOPY WITH RETROGRADE PYELOGRAM, URETEROSCOPY AND STENT PLACEMENT Right 10/21/2014   Procedure: CYSTOSCOPY WITH RETROGRADE PYELOGRAM, URETEROSCOPY, BLADDER TUMOR BIOPSY;  Surgeon: Claybon Jabs, MD;  Location: [facility];  Service: Urology;  Laterality: Right;  . TRANSTHORACIC ECHOCARDIOGRAM  06-21-2013   moderate LVH,  grade I diastolic dysfunction, ef 90-30%,  mild MR & TR  . TRANSURETHRAL RESECTION  OF BLADDER TUMOR  03/23/2012   Procedure: TRANSURETHRAL RESECTION OF BLADDER TUMOR (TURBT);  Surgeon: Claybon Jabs, MD;  Location: [facility];  Service: Urology;  Laterality: N/A;  . TRANSURETHRAL RESECTION OF BLADDER TUMOR N/A 01/08/2013   Procedure: TRANSURETHRAL RESECTION OF BLADDER TUMOR (TURBT);  Surgeon: Claybon Jabs, MD;  Location: [facility];  Service: Urology;  Laterality: N/A;    Social History   Socioeconomic History  . Marital status: Widowed    Spouse name: Not on file  . Number of children: 4  . Years of education: 10  . Highest education level: 10th grade  Occupational History  . 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 Smoking status: Former Smoker    Packs/day: 0.25    Years: 50.00    Pack years: 12.50    Types: Cigarettes    Quit date: 09/24/2006    Years since quitting: 13.4  . Smokeless tobacco: Never Used  Vaping Use  . Vaping Use: Never used  Substance and Sexual Activity  . Alcohol use: No  . Drug use: No  . Sexual activity: Not Currently  Other Topics Concern  . Not on file  Social History Narrative   Occupation: worked in [place] yrs   Social Determinants of Radio broadcast assistant Strain:   . Difficulty of Paying Living Expenses:  Food Insecurity:   . Worried About Charity fundraiser in the Last Year:   . Arboriculturist in the Last Year:   Transportation Needs:   . Film/video editor (Medical):   [name] Lack of Transportation (Non-Medical):   Physical Activity:   . Days of Exercise per Week:   . Minutes of Exercise per Session:   Stress:   . Feeling of Stress :   Social Connections:   . Frequency of Communication with Friends and Family:   . Frequency of Social Gatherings with Friends and Family:   . Attends Religious Services:   . Active Member of Clubs or Organizations:   . Attends Archivist Meetings:   [name] Marital Status:     Family History  Problem Relation Age of Onset  .  Diabetes Father   . Congestive Heart Failure Father   . COPD Father   . Kidney disease Father   . Hypertension Father   . Diabetes Mother        with kidney disease  . Kidney disease Mother   . Hypertension Mother   . Hyperlipidemia Mother   . Heart attack Mother   . Stroke Mother   . Heart attack Brother   . Asthma Brother   . Diabetes Daughter   . Diabetes Son   . Cancer Brother        throat  . Hypertension Brother     Outpatient Encounter Medications as of 03/21/2020  Medication Sig  . albuterol (PROVENTIL) (2.5 MG/3ML) 0.083% nebulizer solution Inhale 3 mLs into the lungs 4 (four) times daily as needed for shortness of breath.  [name] albuterol (VENTOLIN HFA) 108 (90 Base) MCG/ACT inhaler Inhale 1-2 puffs into the lungs every 6 (six) hours as needed for wheezing or shortness of breath.  [name] alendronate (FOSAMAX) 70 MG tablet Take 1 tablet (70 mg total) by mouth once a week. Take with a full glass of water on an empty stomach.  [name] atorvastatin (LIPITOR) 10 MG tablet Take 1 tablet (10 mg total) by mouth daily.  . carvedilol (COREG) 3.125 MG tablet Take 1 tablet (3.125 mg total) by mouth 2 (two) times daily.  . Cholecalciferol (VITAMIN D3) 2000 UNITS TABS Take 1 tablet by mouth daily.  . cycloSPORINE (RESTASIS MULTIDOSE) 0.05 % ophthalmic emulsion Place 1 drop into both eyes 2 (two) times daily.  . Fluticasone-Salmeterol (ADVAIR DISKUS) 100-50 MCG/DOSE AEPB Inhale 1 puff into the lungs 2 (two) times daily.  . furosemide (LASIX) 40 MG tablet Take 1 tablet (40 mg total) by mouth daily. TAKE 1 TABLET BY MOUTH ONCE DAILY  . lisinopril (ZESTRIL) 5 MG tablet Take 1 tablet (5 mg total) by mouth daily.  . metFORMIN (GLUCOPHAGE) 500 MG tablet Take 1 tablet (500 mg total) by mouth daily.  . methimazole (TAPAZOLE) 5 MG tablet Take 1 tablet (5 mg total) by mouth daily.  . potassium chloride SA (KLOR-CON) 20 MEQ tablet Take 1 tablet (20 mEq total) by mouth daily.  . [DISCONTINUED] methimazole  (TAPAZOLE) 5 MG tablet TAKE 1 TABLET BY MOUTH EVERY DAY   No facility-administered encounter medications on file as of 03/21/2020.    ALLERGIES: No Known Allergies  VACCINATION STATUS: Immunization History  Administered Date(s) Administered  . Fluad Quad(high Dose 65+) 10/08/2019  . Influenza, High Dose Seasonal PF 05/25/2017, 05/24/2018  . Influenza,inj,Quad PF,6+ Mos 06/20/2013, 05/30/2014, 06/09/2015, 06/10/2016  . Moderna SARS-COVID-2 Vaccination 11/22/2019, 12/25/2019  . Pneumococcal Conjugate-13 03/07/2015  . Pneumococcal Polysaccharide-23  04/26/2011  . Td 04/26/2011  . Tdap 04/26/2011  . Zoster 04/16/2013     HPI  BRINKLEY PEET is 79 y.o. female who presents today with repeat thyroid function tests.  Her thyroid uptake and scan was not diagnostic due to her inability to lie flat on the scanning table.   PCP: Loman Brooklyn, FNP.  -She was put on low-dose methimazole for symptomatic presentation.  She reports feeling better, has more consistent energy.  She denies palpitations, tremors, nor heat/cold intolerance.  Her previsit thyroid function tests are consistent with appropriate response.  Prior to her last visit, she reports that she has lost approximately 30 pounds over the last 3 years.  He has a steady weight since last visit.    she denies dysphagia, choking, shortness of breath, no recent voice change.    she denies family history of thyroid dysfunction, but denies family hx of thyroid cancer. she denies personal history of goiter. she is not on any anti-thyroid medications nor on any thyroid hormone supplements.                          Review of systems  Constitutional: + Steady weight, + fatigue, - subjective hyperthermia Eyes: no blurry vision, - xerophthalmia ENT: no sore throat, no nodules palpated in throat, no dysphagia/odynophagia, nor hoarseness Cardiovascular: no Chest Pain, no Shortness of Breath, +  palpitations, no leg swelling Respiratory: no  cough, no SOB Gastrointestinal: no Nausea, no Vomiting, no Diarhhea Musculoskeletal: no muscle/joint aches Skin: no rashes Neurological: +  tremors, no numbness, no tingling, no dizziness Psychiatric: no depression, +  anxiety   Objective:    BP (!) 148/68   Pulse 76   Ht 5\' 6"  (1.676 m)   Wt 190 lb (86.2 kg)   BMI 30.67 kg/m   Wt Readings from Last 3 Encounters:  03/21/20 190 lb (86.2 kg)  03/14/20 189 lb 9.6 oz (86 kg)  01/09/20 192 lb 9.6 oz (87.4 kg)                                                Physical exam   Physical Exam- Limited  Constitutional:  Body mass index is 30.67 kg/m. , not in acute distress, normal state of mind Eyes:  EOMI, no exophthalmos Neck: Supple Thyroid: + gross goiter Respiratory: Adequate breathing efforts Musculoskeletal: no gross deformities, walks with a walker, strength intact in all four extremities, no gross restriction of joint movements Skin:  no rashes, no hyperemia Neurological: no tremor with outstretched hands,    CMP     Component Value Date/Time   NA 143 01/09/2020 1028   K 4.1 01/09/2020 1028   CL 104 01/09/2020 1028   CO2 25 01/09/2020 1028   GLUCOSE 86 01/09/2020 1028   GLUCOSE 171 (H) 12/05/2016 0614   BUN 17 01/09/2020 1028   CREATININE 0.81 01/09/2020 1028   CREATININE 0.79 03/22/2013 1707   CALCIUM 9.3 01/09/2020 1028   PROT 7.9 01/09/2020 1028   ALBUMIN 3.9 01/09/2020 1028   AST 20 01/09/2020 1028   ALT 12 01/09/2020 1028   ALKPHOS 93 01/09/2020 1028   BILITOT 0.5 01/09/2020 1028   GFRNONAA 69 01/09/2020 1028   GFRNONAA 74 03/22/2013 1707   GFRAA 79 01/09/2020 1028   GFRAA 86 03/22/2013 1707  CBC    Component Value Date/Time   WBC 4.0 10/08/2019 1146   WBC 5.2 12/02/2016 2255   RBC 5.00 10/08/2019 1146   RBC 4.97 12/02/2016 2255   HGB 13.2 10/08/2019 1146   HCT 41.5 10/08/2019 1146   PLT 161 10/08/2019 1146   MCV 83 10/08/2019 1146   MCH 26.4 (L) 10/08/2019 1146   MCH 28.2 12/02/2016  2255   MCHC 31.8 10/08/2019 1146   MCHC 31.8 12/02/2016 2255   RDW 13.9 10/08/2019 1146   LYMPHSABS 1.0 10/08/2019 1146   MONOABS 0.5 12/02/2016 2255   EOSABS 0.1 10/08/2019 1146   BASOSABS 0.0 10/08/2019 1146     Diabetic Labs (most recent): Lab Results  Component Value Date   HGBA1C 6.4 01/09/2020   HGBA1C 6.5 10/08/2019   HGBA1C 6.2 03/19/2019    Lipid Panel     Component Value Date/Time   CHOL 172 10/08/2019 1146   CHOL 179 03/22/2013 1707   TRIG 36 10/08/2019 1146   TRIG 54 03/22/2013 1707   HDL 95 10/08/2019 1146   HDL 70 03/22/2013 1707   CHOLHDL 1.8 10/08/2019 1146   LDLCALC 69 10/08/2019 1146   LDLCALC 98 03/22/2013 1707   LABVLDL 8 10/08/2019 1146     Lab Results  Component Value Date   TSH 0.14 (L) 03/13/2020   TSH 0.04 (L) 11/06/2019   TSH 0.072 (L) 10/08/2019   TSH 0.472 02/21/2017   TSH 0.585 12/14/2015   TSH 0.366 (L) 06/21/2014   TSH 0.019 (L) 06/19/2013   TSH 0.058 (L) 03/22/2013   TSH 0.120 (L) 09/13/2012   TSH 0.118 (L) 09/12/2012   FREET4 1.1 03/13/2020   FREET4 1.3 11/06/2019   FREET4 1.27 06/19/2013   FREET4 1.29 09/13/2012     Thyroid uptake and scan on November 23, 2019 IMPRESSION: Normal 4 hour and 24 hour radio iodine uptakes. Nondiagnostic thyroid imaging, patient unable to lie flat for imaging. Consider ultrasound thyroid gland to assess thyroid morphology, if clinically indicated.   Assessment & Plan:   1. Subclinical hyperthyroidism  2.  Goiter -Her previsit thyroid function tests are consistent with treatment response to low-dose methimazole.   Patient's uptake and scan was not diagnostic due to technical difficulty.  She will continue to benefit from methimazole intervention.  Discussed and refilled her methimazole with plan to repeat thyroid function test in 3 months with office visit.    She would benefit from intervention with low-dose methimazole.  I discussed initiated methimazole 5 mg p.o. daily at breakfast with  plan to repeat thyroid function test in 3 months. -Regarding her clinical goiter: Thyroid ultrasound on March 13, 2020 Confirms heterogeneous thyroid parenchyma with  no discrete nodules.  She is not tachycardic, pulse rate at 76 .  She is on Coreg 3.125 mg p.o. twice daily for hypertension.    -Patient is advised to maintain close follow up with Loman Brooklyn, FNP for primary care needs.     - Time spent on this patient care encounter:  20 minutes of which 50% was spent in  counseling and the rest reviewing  her current and  previous labs / studies and medications  doses and developing a plan for long term care. Sonya Oliver  participated in the discussions, expressed understanding, and voiced agreement with the above plans.  All questions were answered to her satisfaction. she is encouraged to contact clinic should she have any questions or concerns prior to her return visit.    Follow  up plan: Return in about 3 months (around 06/21/2020) for F/U with Pre-visit Labs.   Thank you for involving me in the care of this pleasant patient, and I will continue to update you with her progress.  Glade Lloyd, MD [facility] Phone: [phone]  Fax: [phone]   03/21/2020, 11:40 AM  This note was partially dictated with voice recognition software. Similar sounding words can be transcribed inadequately or may not  be corrected upon review.

## 2020-03-27 ENCOUNTER — Ambulatory Visit: Payer: Medicare Other | Admitting: "Endocrinology

## 2020-04-01 ENCOUNTER — Other Ambulatory Visit: Payer: Self-pay | Admitting: Family Medicine

## 2020-04-01 DIAGNOSIS — E118 Type 2 diabetes mellitus with unspecified complications: Secondary | ICD-10-CM

## 2020-04-01 DIAGNOSIS — I10 Essential (primary) hypertension: Secondary | ICD-10-CM

## 2020-04-23 ENCOUNTER — Other Ambulatory Visit: Payer: Self-pay | Admitting: *Deleted

## 2020-04-23 MED ORDER — RESTASIS MULTIDOSE 0.05 % OP EMUL: 1.0000 [drp] | Freq: Two times a day (BID) | OPHTHALMIC | 5 refills | Status: DC

## 2020-04-27 ENCOUNTER — Other Ambulatory Visit: Payer: Self-pay | Admitting: Family Medicine

## 2020-04-27 DIAGNOSIS — I10 Essential (primary) hypertension: Secondary | ICD-10-CM

## 2020-04-27 DIAGNOSIS — J441 Chronic obstructive pulmonary disease with (acute) exacerbation: Secondary | ICD-10-CM

## 2020-04-27 DIAGNOSIS — E118 Type 2 diabetes mellitus with unspecified complications: Secondary | ICD-10-CM

## 2020-04-28 ENCOUNTER — Other Ambulatory Visit: Payer: Self-pay | Admitting: Family Medicine

## 2020-04-28 DIAGNOSIS — E119 Type 2 diabetes mellitus without complications: Secondary | ICD-10-CM

## 2020-05-09 ENCOUNTER — Other Ambulatory Visit: Payer: Self-pay | Admitting: Family Medicine

## 2020-05-09 DIAGNOSIS — J441 Chronic obstructive pulmonary disease with (acute) exacerbation: Secondary | ICD-10-CM

## 2020-05-15 ENCOUNTER — Ambulatory Visit: Payer: Medicare Other | Admitting: Family Medicine

## 2020-05-16 ENCOUNTER — Ambulatory Visit (INDEPENDENT_AMBULATORY_CARE_PROVIDER_SITE_OTHER): Payer: Medicare Other | Admitting: Family Medicine

## 2020-05-16 ENCOUNTER — Other Ambulatory Visit: Payer: Self-pay

## 2020-05-16 ENCOUNTER — Encounter: Payer: Self-pay | Admitting: Family Medicine

## 2020-05-16 VITALS — BP 176/84 | HR 83 | Temp 98.4°F | Ht 66.0 in | Wt 183.0 lb

## 2020-05-16 DIAGNOSIS — M810 Age-related osteoporosis without current pathological fracture: Secondary | ICD-10-CM

## 2020-05-16 DIAGNOSIS — E119 Type 2 diabetes mellitus without complications: Secondary | ICD-10-CM | POA: Diagnosis not present

## 2020-05-16 DIAGNOSIS — I5032 Chronic diastolic (congestive) heart failure: Secondary | ICD-10-CM

## 2020-05-16 DIAGNOSIS — Z23 Encounter for immunization: Secondary | ICD-10-CM | POA: Diagnosis not present

## 2020-05-16 DIAGNOSIS — I700 Atherosclerosis of aorta: Secondary | ICD-10-CM

## 2020-05-16 DIAGNOSIS — E559 Vitamin D deficiency, unspecified: Secondary | ICD-10-CM

## 2020-05-16 DIAGNOSIS — I10 Essential (primary) hypertension: Secondary | ICD-10-CM

## 2020-05-16 DIAGNOSIS — E0590 Thyrotoxicosis, unspecified without thyrotoxic crisis or storm: Secondary | ICD-10-CM

## 2020-05-16 DIAGNOSIS — J441 Chronic obstructive pulmonary disease with (acute) exacerbation: Secondary | ICD-10-CM

## 2020-05-16 LAB — BAYER DCA HB A1C WAIVED: HB A1C (BAYER DCA - WAIVED): 6.2 % (ref <7.0)

## 2020-05-16 MED ORDER — FLUTICASONE-SALMETEROL 100-50 MCG/DOSE IN AEPB: 1.0000 | INHALATION_SPRAY | Freq: Two times a day (BID) | RESPIRATORY_TRACT | 11 refills | Status: DC

## 2020-05-16 MED ORDER — LISINOPRIL 10 MG PO TABS: 10.0000 mg | ORAL_TABLET | Freq: Every day | ORAL | 2 refills | Status: DC

## 2020-05-16 MED ORDER — ALBUTEROL SULFATE HFA 108 (90 BASE) MCG/ACT IN AERS: 1.0000 | INHALATION_SPRAY | Freq: Four times a day (QID) | RESPIRATORY_TRACT | 5 refills | Status: DC | PRN

## 2020-05-16 NOTE — Patient Instructions (Signed)
Increase Lisinopril from 5 mg to 10 mg once daily.   DASH Eating Plan DASH stands for "Dietary Approaches to Stop Hypertension." The DASH eating plan is a healthy eating plan that has been shown to reduce high blood pressure (hypertension). It may also reduce your risk for type 2 diabetes, heart disease, and stroke. The DASH eating plan may also help with weight loss. What are tips for following this plan?  General guidelines  Avoid eating more than 2,300 mg (milligrams) of salt (sodium) a day. If you have hypertension, you may need to reduce your sodium intake to 1,500 mg a day.  Limit alcohol intake to no more than 1 drink a day for nonpregnant women and 2 drinks a day for men. One drink equals 12 oz of beer, 5 oz of wine, or 1 oz of hard liquor.  Work with your health care provider to maintain a healthy body weight or to lose weight. Ask what an ideal weight is for you.  Get at least 30 minutes of exercise that causes your heart to beat faster (aerobic exercise) most days of the week. Activities may include walking, swimming, or biking.  Work with your health care provider or diet and nutrition specialist (dietitian) to adjust your eating plan to your individual calorie needs. Reading food labels   Check food labels for the amount of sodium per serving. Choose foods with less than 5 percent of the Daily Value of sodium. Generally, foods with less than 300 mg of sodium per serving fit into this eating plan.  To find whole grains, look for the word "whole" as the first word in the ingredient list. Shopping  Buy products labeled as "low-sodium" or "no salt added."  Buy fresh foods. Avoid canned foods and premade or frozen meals. Cooking  Avoid adding salt when cooking. Use salt-free seasonings or herbs instead of table salt or sea salt. Check with your health care provider or pharmacist before using salt substitutes.  Do not fry foods. Cook foods using healthy methods such as  baking, boiling, grilling, and broiling instead.  Cook with heart-healthy oils, such as olive, canola, soybean, or sunflower oil. Meal planning  Eat a balanced diet that includes: ? 5 or more servings of fruits and vegetables each day. At each meal, try to fill half of your plate with fruits and vegetables. ? Up to 6-8 servings of whole grains each day. ? Less than 6 oz of lean meat, poultry, or fish each day. A 3-oz serving of meat is about the same size as a deck of cards. One egg equals 1 oz. ? 2 servings of low-fat dairy each day. ? A serving of nuts, seeds, or beans 5 times each week. ? Heart-healthy fats. Healthy fats called Omega-3 fatty acids are found in foods such as flaxseeds and coldwater fish, like sardines, salmon, and mackerel.  Limit how much you eat of the following: ? Canned or prepackaged foods. ? Food that is high in trans fat, such as fried foods. ? Food that is high in saturated fat, such as fatty meat. ? Sweets, desserts, sugary drinks, and other foods with added sugar. ? Full-fat dairy products.  Do not salt foods before eating.  Try to eat at least 2 vegetarian meals each week.  Eat more home-cooked food and less restaurant, buffet, and fast food.  When eating at a restaurant, ask that your food be prepared with less salt or no salt, if possible. What foods are recommended? The items  listed may not be a complete list. Talk with your dietitian about what dietary choices are best for you. Grains Whole-grain or whole-wheat bread. Whole-grain or whole-wheat pasta. Brown rice. [place]. Bulgur. Whole-grain and low-sodium cereals. Pita bread. Low-fat, low-sodium crackers. Whole-wheat flour tortillas. Vegetables Fresh or frozen vegetables (raw, steamed, roasted, or grilled). Low-sodium or reduced-sodium tomato and vegetable juice. Low-sodium or reduced-sodium tomato sauce and tomato paste. Low-sodium or reduced-sodium canned vegetables. Fruits All fresh,  dried, or frozen fruit. Canned fruit in natural juice (without added sugar). Meat and other protein foods Skinless chicken or [place]. Ground chicken or [place]. Pork with fat trimmed off. Fish and seafood. Egg whites. Dried beans, peas, or lentils. Unsalted nuts, nut butters, and seeds. Unsalted canned beans. Lean cuts of beef with fat trimmed off. Low-sodium, lean deli meat. Dairy Low-fat (1%) or fat-free (skim) milk. Fat-free, low-fat, or reduced-fat cheeses. Nonfat, low-sodium ricotta or cottage cheese. Low-fat or nonfat yogurt. Low-fat, low-sodium cheese. Fats and oils Soft margarine without trans fats. Vegetable oil. Low-fat, reduced-fat, or light mayonnaise and salad dressings (reduced-sodium). Canola, safflower, olive, soybean, and sunflower oils. Avocado. Seasoning and other foods Herbs. Spices. Seasoning mixes without salt. Unsalted popcorn and pretzels. Fat-free sweets. What foods are not recommended? The items listed may not be a complete list. Talk with your dietitian about what dietary choices are best for you. Grains Baked goods made with fat, such as croissants, muffins, or some breads. Dry pasta or rice meal packs. Vegetables Creamed or fried vegetables. Vegetables in a cheese sauce. Regular canned vegetables (not low-sodium or reduced-sodium). Regular canned tomato sauce and paste (not low-sodium or reduced-sodium). Regular tomato and vegetable juice (not low-sodium or reduced-sodium). Angie Fava. Olives. Fruits Canned fruit in a light or heavy syrup. Fried fruit. Fruit in cream or butter sauce. Meat and other protein foods Fatty cuts of meat. Ribs. Fried meat. Berniece Salines. Sausage. Bologna and other processed lunch meats. Salami. Fatback. Hotdogs. Bratwurst. Salted nuts and seeds. Canned beans with added salt. Canned or smoked fish. Whole eggs or egg yolks. Chicken or [place] with skin. Dairy Whole or 2% milk, cream, and half-and-half. Whole or full-fat cream cheese. Whole-fat or sweetened  yogurt. Full-fat cheese. Nondairy creamers. Whipped toppings. Processed cheese and cheese spreads. Fats and oils Butter. Stick margarine. Lard. Shortening. Ghee. Bacon fat. Tropical oils, such as coconut, palm kernel, or palm oil. Seasoning and other foods Salted popcorn and pretzels. Onion salt, garlic salt, seasoned salt, table salt, and sea salt. Worcestershire sauce. Tartar sauce. Barbecue sauce. Teriyaki sauce. Soy sauce, including reduced-sodium. Steak sauce. Canned and packaged gravies. Fish sauce. Oyster sauce. Cocktail sauce. Horseradish that you find on the shelf. Ketchup. Mustard. Meat flavorings and tenderizers. Bouillon cubes. Hot sauce and Tabasco sauce. Premade or packaged marinades. Premade or packaged taco seasonings. Relishes. Regular salad dressings. Where to find more information:  National Heart, Lung, and [place]: https://wilson-eaton.com/  American Heart Association: www.heart.org Summary  The DASH eating plan is a healthy eating plan that has been shown to reduce high blood pressure (hypertension). It may also reduce your risk for type 2 diabetes, heart disease, and stroke.  With the DASH eating plan, you should limit salt (sodium) intake to 2,300 mg a day. If you have hypertension, you may need to reduce your sodium intake to 1,500 mg a day.  When on the DASH eating plan, aim to eat more fresh fruits and vegetables, whole grains, lean proteins, low-fat dairy, and heart-healthy fats.  Work with your health care provider or  diet and nutrition specialist (dietitian) to adjust your eating plan to your individual calorie needs. This information is not intended to replace advice given to you by your health care provider. Make sure you discuss any questions you have with your health care provider. Document Revised: 07/29/2017 Document Reviewed: 08/09/2016 Elsevier Patient Education  2020 [employer].

## 2020-05-16 NOTE — Progress Notes (Signed)
Assessment & Plan:  1. Type 2 diabetes mellitus without complication, without long-term current use of insulin (HCC) Lab Results  Component Value Date   HGBA1C 6.2 05/16/2020   HGBA1C 6.4 01/09/2020   HGBA1C 6.5 10/08/2019  - Diabetes is at goal of A1c < 7. - Medications: continue current medications - Patient is currently taking a statin. Patient is taking an ACE-inhibitor/ARB.  - Last foot exam: 10/08/2019 - Last diabetic eye exam: 12/07/2017 - Urine Microalbumin/Creat Ratio: 10/08/2019 - Instruction/counseling given: reminded to get eye exam - Bayer DCA Hb A1c Waived - CMP14+EGFR - lisinopril (ZESTRIL) 10 MG tablet; Take 1 tablet (10 mg total) by mouth daily.  Dispense: 30 tablet; Refill: 2  2. Essential hypertension - Uncontrolled. Lisinopril increased from 5 mg to 10 mg once daily. Education provided on the DASH diet.  - CMP14+EGFR - lisinopril (ZESTRIL) 10 MG tablet; Take 1 tablet (10 mg total) by mouth daily.  Dispense: 30 tablet; Refill: 2  3. Chronic diastolic CHF (congestive heart failure) (HCC) - Well controlled on current regimen.  - CMP14+EGFR  4. Aortic atherosclerosis ([place]) - On statin therapy.  - CMP14+EGFR  5. Subclinical hyperthyroidism - Managed by endocrinology.  - CMP14+EGFR  6. Chronic obstructive pulmonary disease with acute exacerbation (HCC) - Well controlled on current regimen.  - albuterol (VENTOLIN HFA) 108 (90 Base) MCG/ACT inhaler; Inhale 1-2 puffs into the lungs every 6 (six) hours as needed for wheezing or shortness of breath.  Dispense: 6.7 each; Refill: 5 - Fluticasone-Salmeterol (ADVAIR DISKUS) 100-50 MCG/DOSE AEPB; Inhale 1 puff into the lungs 2 (two) times daily.  Dispense: 60 each; Refill: 11  7. Vitamin D deficiency - Well controlled on current regimen.   8. Age-related osteoporosis without current pathological fracture - DG [facility] DEXA; Future  9. Need for immunization against influenza - Flu Vaccine QUAD High Dose(Fluad)   Return  in about 6 weeks (around 06/27/2020) for HTN.  Hendricks Limes, MSN, APRN, FNP-C [facility]  Subjective:    Patient ID: [name], female    DOB: May 16, 1941, 79 y.o.   MRN: [id number]  Patient Care Team: Loman Brooklyn, FNP as PCP - General (Family Medicine) Kathie Rhodes, MD as Consulting Physician (Urology) Steffanie Rainwater, DPM as Consulting Physician (Podiatry) Melina Schools, OD as Consulting Physician (Optometry) Sinda Du, MD as Consulting Physician (Pulmonary Disease) Jacelyn Pi, MD as Consulting Physician (Endocrinology)   Chief Complaint:  Chief Complaint  Patient presents with  . Diabetes    2 month follow up of chronic medical conditions  . Hypertension    HPI: Sonya Oliver is a 79 y.o. female presenting on 05/16/2020 for Diabetes (2 month follow up of chronic medical conditions) and Hypertension  Diabetes: Patient presents for follow up of diabetes. Current symptoms include: none. Known diabetic complications: cardiovascular disease. Medication compliance: yes. Current diet: in general, an "unhealthy" diet. Current exercise: none. Is she  on ACE inhibitor or angiotensin II receptor blocker? Yes. Is she on a statin? Yes.   Lab Results  Component Value Date   HGBA1C 6.2 05/16/2020   HGBA1C 6.4 01/09/2020   HGBA1C 6.5 10/08/2019   Lab Results  Component Value Date   MICROALBUR 20 10/25/2014   LDLCALC 69 10/08/2019   CREATININE 0.79 05/16/2020    Hypertension: patient's Lisinopril was last increased from 2.5 mg to 5 mg once daily. She reports her systolic BP at home is ranging 170-180s.   New complaints: None  Social history:  Relevant past medical, surgical, family and social history reviewed and updated as indicated. Interim medical history since our last visit reviewed.  Allergies and medications reviewed and updated.  DATA REVIEWED: CHART IN EPIC  ROS: Negative unless specifically indicated above in HPI.     Current Outpatient Medications:  .  albuterol (PROVENTIL) (2.5 MG/3ML) 0.083% nebulizer solution, Inhale 3 mLs into the lungs 4 (four) times daily as needed for shortness of breath., Disp: 240 mL, Rfl: 11 .  albuterol (VENTOLIN HFA) 108 (90 Base) MCG/ACT inhaler, INHALE 1-2 PUFFS INTO THE LUNGS EVERY 6 (SIX) HOURS AS NEEDED FOR WHEEZING OR SHORTNESS OF BREATH., Disp: 6.7 each, Rfl: 0 .  alendronate (FOSAMAX) 70 MG tablet, Take 1 tablet (70 mg total) by mouth once a week. Take with a full glass of water on an empty stomach., Disp: 4 tablet, Rfl: 11 .  atorvastatin (LIPITOR) 10 MG tablet, TAKE 1 TABLET BY MOUTH EVERY DAY, Disp: 90 tablet, Rfl: 0 .  carvedilol (COREG) 3.125 MG tablet, Take 1 tablet (3.125 mg total) by mouth 2 (two) times daily., Disp: 180 tablet, Rfl: 3 .  Cholecalciferol (VITAMIN D3) 2000 UNITS TABS, Take 1 tablet by mouth daily., Disp: , Rfl:  .  cycloSPORINE (RESTASIS MULTIDOSE) 0.05 % ophthalmic emulsion, Place 1 drop into both eyes 2 (two) times daily., Disp: 5.5 mL, Rfl: 5 .  Fluticasone-Salmeterol (ADVAIR DISKUS) 100-50 MCG/DOSE AEPB, Inhale 1 puff into the lungs 2 (two) times daily., Disp: 60 each, Rfl: 11 .  furosemide (LASIX) 40 MG tablet, Take 1 tablet (40 mg total) by mouth daily. TAKE 1 TABLET BY MOUTH ONCE DAILY, Disp: 30 tablet, Rfl: 11 .  lisinopril (ZESTRIL) 5 MG tablet, TAKE 1 TABLET BY MOUTH EVERY DAY, Disp: 90 tablet, Rfl: 0 .  metFORMIN (GLUCOPHAGE) 500 MG tablet, TAKE 1 TABLET BY MOUTH EVERY DAY, Disp: 90 tablet, Rfl: 0 .  methimazole (TAPAZOLE) 5 MG tablet, Take 1 tablet (5 mg total) by mouth daily., Disp: 90 tablet, Rfl: 1 .  potassium chloride SA (KLOR-CON) 20 MEQ tablet, Take 1 tablet (20 mEq total) by mouth daily., Disp: 30 tablet, Rfl: 11   No Known Allergies Past Medical History:  Diagnosis Date  . Arthritis   . Bladder tumor   . Cancer ([place]) 2015   bladder  . Cataract   . CHF (congestive heart failure) ([place])    NO CARDIOLOGIST---  MONITORED BY  PCP DR Jacelyn Grip  . COPD with emphysema ([place])    last Acute Exacerbation 08-21-2014  . Dyspnea on exertion   . H/O pulmonary edema    jan 2014--  acute CHF w/ pulmonary edema  . Hypertension   . Hyperthyroidism    currently no meds -- labs stable  . Nocturia   . Nocturnal oxygen desaturation    uses O2  HS via [place]--  and PRN daytime  . Osteoporosis   . Stroke ([place])   . Type 2 diabetes mellitus ([place])   . Ureteral tumor     Past Surgical History:  Procedure Laterality Date  . ABDOMINAL HYSTERECTOMY  1980'S   W/ BILATERAL SALPINGO-OOPHORECTOMY  . CATARACT EXTRACTION W/PHACO Left 01/31/2014   Procedure: CATARACT EXTRACTION PHACO AND INTRAOCULAR LENS PLACEMENT (IOC);  Surgeon: Tonny Branch, MD;  Location: [facility];  Service: Ophthalmology;  Laterality: Left;  CDE: 23.78  . CATARACT EXTRACTION W/PHACO Right 02/28/2014   Procedure: CATARACT EXTRACTION PHACO AND INTRAOCULAR LENS PLACEMENT (IOC);  Surgeon: Tonny Branch, MD;  Location: [facility];  Service: Ophthalmology;  Laterality: Right;  CDE:  11.34  . CYSTOSCOPY  03/23/2012   Procedure: CYSTOSCOPY;  Surgeon: Claybon Jabs, MD;  Location: [facility];  Service: Urology;  Laterality: N/A;  . CYSTOSCOPY WITH RETROGRADE PYELOGRAM, URETEROSCOPY AND STENT PLACEMENT Right 10/21/2014   Procedure: CYSTOSCOPY WITH RETROGRADE PYELOGRAM, URETEROSCOPY, BLADDER TUMOR BIOPSY;  Surgeon: Claybon Jabs, MD;  Location: [facility];  Service: Urology;  Laterality: Right;  . TRANSTHORACIC ECHOCARDIOGRAM  06-21-2013   moderate LVH,  grade I diastolic dysfunction, ef 66-06%,  mild MR & TR  . TRANSURETHRAL RESECTION OF BLADDER TUMOR  03/23/2012   Procedure: TRANSURETHRAL RESECTION OF BLADDER TUMOR (TURBT);  Surgeon: Claybon Jabs, MD;  Location: [facility];  Service: Urology;  Laterality: N/A;  . TRANSURETHRAL RESECTION OF BLADDER TUMOR N/A 01/08/2013   Procedure: TRANSURETHRAL RESECTION OF BLADDER TUMOR (TURBT);  Surgeon: Claybon Jabs, MD;  Location: [facility];  Service: Urology;  Laterality: N/A;    Social History   Socioeconomic History  . Marital status: Widowed    Spouse name: Not on file  . Number of children: 4  . Years of education: 10  . Highest education level: 10th grade  Occupational History  . 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 Smoking status: Former Smoker    Packs/day: 0.25    Years: 50.00    Pack years: 12.50    Types: Cigarettes    Quit date: 09/24/2006    Years since quitting: 13.6  . Smokeless tobacco: Never Used  Vaping Use  . Vaping Use: Never used  Substance and Sexual Activity  . Alcohol use: No  . Drug use: No  . Sexual activity: Not Currently  Other Topics Concern  . Not on file  Social History Narrative   Occupation: worked in [place] yrs   Social Determinants of Radio broadcast assistant Strain:   . Difficulty of Paying Living Expenses: Not on file  Food Insecurity:   . Worried About Charity fundraiser in the Last Year: Not on file  . Ran Out of Food in the Last Year: Not on file  Transportation Needs:   . Lack of Transportation (Medical): Not on file  . Lack of Transportation (Non-Medical): Not on file  Physical Activity:   . Days of Exercise per Week: Not on file  . Minutes of Exercise per Session: Not on file  Stress:   . Feeling of Stress : Not on file  Social Connections:   . Frequency of Communication with Friends and Family: Not on file  . Frequency of Social Gatherings with Friends and Family: Not on file  . Attends Religious Services: Not on file  . Active Member of Clubs or Organizations: Not on file  . Attends Archivist Meetings: Not on file  . Marital Status: Not on file  Intimate Partner Violence:   . Fear of Current or Ex-Partner: Not on file  . Emotionally Abused: Not on file  . Physically Abused: Not on file  . Sexually Abused: Not on file        Objective:    BP (!) 176/84   Pulse 83   Temp 98.4 F (36.9 C)  (Temporal)   Ht _0  (1.676 m)   Wt 183 lb (83 kg)   SpO2 90%   BMI 29.54 kg/m   Wt Readings from Last 3 Encounters:  05/16/20 183 lb (83 kg)  03/21/20 190 lb (86.2 kg)  03/14/20 189 lb 9.6 oz (86 kg)    Physical Exam Vitals reviewed.  Constitutional:      General: She is not in acute distress.    Appearance: Normal appearance. She is overweight. She is not ill-appearing, toxic-appearing or diaphoretic.  HENT:     Head: Normocephalic and atraumatic.  Eyes:     General: No scleral icterus.       Right eye: No discharge.        Left eye: No discharge.     Conjunctiva/sclera: Conjunctivae normal.  Cardiovascular:     Rate and Rhythm: Normal rate and regular rhythm.     Heart sounds: Normal heart sounds. No murmur heard.  No friction rub. No gallop.   Pulmonary:     Effort: Pulmonary effort is normal. No respiratory distress.     Breath sounds: Normal breath sounds. No stridor. No wheezing, rhonchi or rales.  Musculoskeletal:        General: Normal range of motion.     Cervical back: Normal range of motion.  Skin:    General: Skin is warm and dry.     Capillary Refill: Capillary refill takes less than 2 seconds.  Neurological:     General: No focal deficit present.     Mental Status: She is alert and oriented to person, place, and time. Mental status is at baseline.     Gait: Gait abnormal (ambulates with rolling walker).  Psychiatric:        Mood and Affect: Mood normal.        Behavior: Behavior normal.        Thought Content: Thought content normal.        Judgment: Judgment normal.     Lab Results  Component Value Date   TSH 0.14 (L) 03/13/2020   Lab Results  Component Value Date   WBC 4.0 10/08/2019   HGB 13.2 10/08/2019   HCT 41.5 10/08/2019   MCV 83 10/08/2019   PLT 161 10/08/2019   Lab Results  Component Value Date   NA 143 01/09/2020   K 4.1 01/09/2020   CO2 25 01/09/2020   GLUCOSE 86 01/09/2020   BUN 17 01/09/2020   CREATININE 0.81  01/09/2020   BILITOT 0.5 01/09/2020   ALKPHOS 93 01/09/2020   AST 20 01/09/2020   ALT 12 01/09/2020   PROT 7.9 01/09/2020   ALBUMIN 3.9 01/09/2020   CALCIUM 9.3 01/09/2020   ANIONGAP 10 12/05/2016   Lab Results  Component Value Date   CHOL 172 10/08/2019   Lab Results  Component Value Date   HDL 95 10/08/2019   Lab Results  Component Value Date   LDLCALC 69 10/08/2019   Lab Results  Component Value Date   TRIG 36 10/08/2019   Lab Results  Component Value Date   CHOLHDL 1.8 10/08/2019   Lab Results  Component Value Date   HGBA1C 6.4 01/09/2020

## 2020-05-17 LAB — CMP14+EGFR
ALT: 7 IU/L (ref 0–32)
AST: 16 IU/L (ref 0–40)
Albumin/Globulin Ratio: 1.1 — ABNORMAL LOW (ref 1.2–2.2)
Albumin: 4.2 g/dL (ref 3.7–4.7)
Alkaline Phosphatase: 65 IU/L (ref 44–121)
BUN/Creatinine Ratio: 16 (ref 12–28)
BUN: 13 mg/dL (ref 8–27)
Bilirubin Total: 0.7 mg/dL (ref 0.0–1.2)
CO2: 28 mmol/L (ref 20–29)
Calcium: 9.2 mg/dL (ref 8.7–10.3)
Chloride: 98 mmol/L (ref 96–106)
Creatinine, Ser: 0.79 mg/dL (ref 0.57–1.00)
GFR calc Af Amer: 82 mL/min/{1.73_m2} (ref >59)
GFR calc non Af Amer: 71 mL/min/{1.73_m2} (ref >59)
Globulin, Total: 4 g/dL (ref 1.5–4.5)
Glucose: 84 mg/dL (ref 65–99)
Potassium: 4 mmol/L (ref 3.5–5.2)
Sodium: 138 mmol/L (ref 134–144)
Total Protein: 8.2 g/dL (ref 6.0–8.5)

## 2020-05-18 ENCOUNTER — Encounter: Payer: Self-pay | Admitting: Family Medicine

## 2020-05-30 ENCOUNTER — Encounter: Payer: Self-pay | Admitting: *Deleted

## 2020-06-10 ENCOUNTER — Other Ambulatory Visit: Payer: Self-pay | Admitting: "Endocrinology

## 2020-06-10 LAB — T3, FREE: T3, Free: 3.2 pg/mL (ref 2.3–4.2)

## 2020-06-10 LAB — TSH: TSH: 0.22 mIU/L — ABNORMAL LOW (ref 0.40–4.50)

## 2020-06-10 LAB — T4, FREE: Free T4: 1.2 ng/dL (ref 0.8–1.8)

## 2020-06-16 ENCOUNTER — Other Ambulatory Visit: Payer: Self-pay | Admitting: Family Medicine

## 2020-06-16 DIAGNOSIS — J441 Chronic obstructive pulmonary disease with (acute) exacerbation: Secondary | ICD-10-CM

## 2020-06-25 ENCOUNTER — Ambulatory Visit: Payer: Medicare Other | Admitting: Family Medicine

## 2020-06-25 ENCOUNTER — Ambulatory Visit: Payer: Medicare Other

## 2020-06-25 ENCOUNTER — Encounter: Payer: Self-pay | Admitting: "Endocrinology

## 2020-06-25 ENCOUNTER — Other Ambulatory Visit: Payer: Medicare Other

## 2020-06-25 ENCOUNTER — Ambulatory Visit: Payer: Medicare Other | Admitting: Nurse Practitioner

## 2020-06-25 ENCOUNTER — Ambulatory Visit (INDEPENDENT_AMBULATORY_CARE_PROVIDER_SITE_OTHER): Payer: Medicare Other | Admitting: "Endocrinology

## 2020-06-25 ENCOUNTER — Other Ambulatory Visit: Payer: Self-pay

## 2020-06-25 VITALS — BP 138/74 | HR 72 | Ht 66.0 in | Wt 184.6 lb

## 2020-06-25 DIAGNOSIS — E0590 Thyrotoxicosis, unspecified without thyrotoxic crisis or storm: Secondary | ICD-10-CM | POA: Diagnosis not present

## 2020-06-25 MED ORDER — METHIMAZOLE 5 MG PO TABS: 5.0000 mg | ORAL_TABLET | Freq: Every day | ORAL | 1 refills | Status: DC

## 2020-06-25 NOTE — Progress Notes (Signed)
06/25/2020         Endocrinology follow-up note   Subjective:    Patient ID: Sonya Oliver, female    DOB: [birth year], PCP Loman Brooklyn, FNP.   Past Medical History:  Diagnosis Date  . Arthritis   . Bladder cancer ([place]) 2015  . Cataract   . CHF (congestive heart failure) ([place])    NO CARDIOLOGIST---  MONITORED BY PCP DR Jacelyn Grip  . COPD with emphysema ([place])    last Acute Exacerbation 08-21-2014  . H/O pulmonary edema    jan 2014--  acute CHF w/ pulmonary edema  . Hypertension   . Hyperthyroidism    currently no meds -- labs stable  . Nocturia   . Nocturnal oxygen desaturation    uses O2  HS via [place]--  and PRN daytime  . Osteoporosis   . Stroke ([place])   . Type 2 diabetes mellitus ([place])   . Ureteral tumor     Past Surgical History:  Procedure Laterality Date  . ABDOMINAL HYSTERECTOMY  1980'S   W/ BILATERAL SALPINGO-OOPHORECTOMY  . CATARACT EXTRACTION W/PHACO Left 01/31/2014   Procedure: CATARACT EXTRACTION PHACO AND INTRAOCULAR LENS PLACEMENT (IOC);  Surgeon: Tonny Branch, MD;  Location: [facility];  Service: Ophthalmology;  Laterality: Left;  CDE: 23.78  . CATARACT EXTRACTION W/PHACO Right 02/28/2014   Procedure: CATARACT EXTRACTION PHACO AND INTRAOCULAR LENS PLACEMENT (IOC);  Surgeon: Tonny Branch, MD;  Location: [facility];  Service: Ophthalmology;  Laterality: Right;  CDE:  11.34  . CYSTOSCOPY  03/23/2012   Procedure: CYSTOSCOPY;  Surgeon: Claybon Jabs, MD;  Location: [facility];  Service: Urology;  Laterality: N/A;  . CYSTOSCOPY WITH RETROGRADE PYELOGRAM, URETEROSCOPY AND STENT PLACEMENT Right 10/21/2014   Procedure: CYSTOSCOPY WITH RETROGRADE PYELOGRAM, URETEROSCOPY, BLADDER TUMOR BIOPSY;  Surgeon: Claybon Jabs, MD;  Location: [facility];  Service: Urology;  Laterality: Right;  . TRANSTHORACIC ECHOCARDIOGRAM  06-21-2013   moderate LVH,  grade I diastolic dysfunction, ef 53-66%,  mild MR & TR  . TRANSURETHRAL RESECTION OF BLADDER TUMOR  03/23/2012   Procedure:  TRANSURETHRAL RESECTION OF BLADDER TUMOR (TURBT);  Surgeon: Claybon Jabs, MD;  Location: [facility];  Service: Urology;  Laterality: N/A;  . TRANSURETHRAL RESECTION OF BLADDER TUMOR N/A 01/08/2013   Procedure: TRANSURETHRAL RESECTION OF BLADDER TUMOR (TURBT);  Surgeon: Claybon Jabs, MD;  Location: [facility];  Service: Urology;  Laterality: N/A;    Social History   Socioeconomic History  . Marital status: Widowed    Spouse name: Not on file  . Number of children: 4  . Years of education: 10  . Highest education level: 10th grade  Occupational History  . 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 Smoking status: Former Smoker    Packs/day: 0.25    Years: 50.00    Pack years: 12.50    Types: Cigarettes    Quit date: 09/24/2006    Years since quitting: 13.7  . Smokeless tobacco: Never Used  Vaping Use  . Vaping Use: Never used  Substance and Sexual Activity  . Alcohol use: No  . Drug use: No  . Sexual activity: Not Currently  Other Topics Concern  . Not on file  Social History Narrative   Occupation: worked in [place] yrs   Social Determinants of Radio broadcast assistant Strain:   . Difficulty of Paying Living Expenses: Not on file  Food Insecurity:   . Worried About [employer] of  Food in the Last Year: Not on file  . Ran Out of Food in the Last Year: Not on file  Transportation Needs:   . Lack of Transportation (Medical): Not on file  . Lack of Transportation (Non-Medical): Not on file  Physical Activity:   . Days of Exercise per Week: Not on file  . Minutes of Exercise per Session: Not on file  Stress:   . Feeling of Stress : Not on file  Social Connections:   . Frequency of Communication with Friends and Family: Not on file  . Frequency of Social Gatherings with Friends and Family: Not on file  . Attends Religious Services: Not on file  . Active Member of Clubs or Organizations: Not on file  . Attends Archivist  Meetings: Not on file  . Marital Status: Not on file    Family History  Problem Relation Age of Onset  . Diabetes Father   . Congestive Heart Failure Father   . COPD Father   . Kidney disease Father   . Hypertension Father   . Diabetes Mother        with kidney disease  . Kidney disease Mother   . Hypertension Mother   . Hyperlipidemia Mother   . Heart attack Mother   . Stroke Mother   . Heart attack Brother   . Asthma Brother   . Diabetes Daughter   . Diabetes Son   . Cancer Brother        throat  . Hypertension Brother     Outpatient Encounter Medications as of 06/25/2020  Medication Sig  . albuterol (PROVENTIL) (2.5 MG/3ML) 0.083% nebulizer solution Inhale 3 mLs into the lungs 4 (four) times daily as needed for shortness of breath.  [name] albuterol (VENTOLIN HFA) 108 (90 Base) MCG/ACT inhaler INHALE 1-2 PUFFS INTO THE LUNGS EVERY 6 (SIX) HOURS AS NEEDED FOR WHEEZING OR SHORTNESS OF BREATH.  [name] alendronate (FOSAMAX) 70 MG tablet Take 1 tablet (70 mg total) by mouth once a week. Take with a full glass of water on an empty stomach.  [name] atorvastatin (LIPITOR) 10 MG tablet TAKE 1 TABLET BY MOUTH EVERY DAY  . carvedilol (COREG) 3.125 MG tablet Take 1 tablet (3.125 mg total) by mouth 2 (two) times daily.  . Cholecalciferol (VITAMIN D3) 2000 UNITS TABS Take 1 tablet by mouth daily.  . cycloSPORINE (RESTASIS MULTIDOSE) 0.05 % ophthalmic emulsion Place 1 drop into both eyes 2 (two) times daily.  . Fluticasone-Salmeterol (ADVAIR DISKUS) 100-50 MCG/DOSE AEPB Inhale 1 puff into the lungs 2 (two) times daily.  . furosemide (LASIX) 40 MG tablet Take 1 tablet (40 mg total) by mouth daily. TAKE 1 TABLET BY MOUTH ONCE DAILY  . lisinopril (ZESTRIL) 10 MG tablet Take 1 tablet (10 mg total) by mouth daily.  . metFORMIN (GLUCOPHAGE) 500 MG tablet TAKE 1 TABLET BY MOUTH EVERY DAY  . methimazole (TAPAZOLE) 5 MG tablet Take 1 tablet (5 mg total) by mouth daily.  . potassium chloride SA (KLOR-CON) 20 MEQ  tablet Take 1 tablet (20 mEq total) by mouth daily.  . [DISCONTINUED] methimazole (TAPAZOLE) 5 MG tablet Take 1 tablet (5 mg total) by mouth daily.   No facility-administered encounter medications on file as of 06/25/2020.    ALLERGIES: No Known Allergies  VACCINATION STATUS: Immunization History  Administered Date(s) Administered  . Fluad Quad(high Dose 65+) 10/08/2019, 05/16/2020  . Influenza, High Dose Seasonal PF 05/25/2017, 05/24/2018  . Influenza,inj,Quad PF,6+ Mos 06/20/2013, 05/30/2014, 06/09/2015, 06/10/2016  .  Moderna SARS-COVID-2 Vaccination 11/22/2019, 12/25/2019  . Pneumococcal Conjugate-13 03/07/2015  . Pneumococcal Polysaccharide-23 04/26/2011  . Td 04/26/2011  . Tdap 04/26/2011  . Zoster 04/16/2013     HPI  MESA JANUS is 79 y.o. female who presents today with repeat thyroid function tests.  She was started on low-dose methimazole for symptomatic subclinical hyperthyroidism.    PCP: Loman Brooklyn, FNP.  - She reports feeling better, has more consistent energy.  She denies palpitations, tremors, nor heat/cold intolerance.  Her previsit thyroid function tests are consistent with appropriate response.  Prior to her last visit, she reports that she has lost approximately 30 pounds over the last 3 years.  She has steady weight over the last 3 visits.   she denies dysphagia, choking, shortness of breath, no recent voice change.    she denies family history of thyroid dysfunction, but denies family hx of thyroid cancer. she denies personal history of goiter. she is not on any anti-thyroid medications nor on any thyroid hormone supplements.                          Review of systems  Constitutional: + Steady weight, + fatigue, - subjective hyperthermia Eyes: no blurry vision, - xerophthalmia ENT: no sore throat, no nodules palpated in throat, no dysphagia/odynophagia, nor hoarseness Cardiovascular: no Chest Pain, no Shortness of Breath, +  palpitations, no leg  swelling Respiratory: no cough, no SOB Gastrointestinal: no Nausea, no Vomiting, no Diarhhea Musculoskeletal: no muscle/joint aches Skin: no rashes Neurological: -  tremors, no numbness, no tingling, no dizziness Psychiatric: no depression, -  anxiety   Objective:    BP 138/74   Pulse 72   Ht 5\' 6"  (1.676 m)   Wt 184 lb 9.6 oz (83.7 kg)   BMI 29.80 kg/m   Wt Readings from Last 3 Encounters:  06/25/20 184 lb 9.6 oz (83.7 kg)  05/16/20 183 lb (83 kg)  03/21/20 190 lb (86.2 kg)                                                Physical exam   Physical Exam- Limited  Constitutional:  Body mass index is 29.8 kg/m. , not in acute distress, normal state of mind Eyes:  EOMI, no exophthalmos Neck: Supple Thyroid: + gross goiter Respiratory: Adequate breathing efforts Musculoskeletal: no gross deformities, walks with a walker, strength intact in all four extremities, no gross restriction of joint movements Skin:  no rashes, no hyperemia Neurological: no tremor with outstretched hands,    CMP     Component Value Date/Time   NA 138 05/16/2020 1614   K 4.0 05/16/2020 1614   CL 98 05/16/2020 1614   CO2 28 05/16/2020 1614   GLUCOSE 84 05/16/2020 1614   GLUCOSE 171 (H) 12/05/2016 0614   BUN 13 05/16/2020 1614   CREATININE 0.79 05/16/2020 1614   CREATININE 0.79 03/22/2013 1707   CALCIUM 9.2 05/16/2020 1614   PROT 8.2 05/16/2020 1614   ALBUMIN 4.2 05/16/2020 1614   AST 16 05/16/2020 1614   ALT 7 05/16/2020 1614   ALKPHOS 65 05/16/2020 1614   BILITOT 0.7 05/16/2020 1614   GFRNONAA 71 05/16/2020 1614   GFRNONAA 74 03/22/2013 1707   GFRAA 82 05/16/2020 1614   GFRAA 86 03/22/2013 1707     CBC  Component Value Date/Time   WBC 4.0 10/08/2019 1146   WBC 5.2 12/02/2016 2255   RBC 5.00 10/08/2019 1146   RBC 4.97 12/02/2016 2255   HGB 13.2 10/08/2019 1146   HCT 41.5 10/08/2019 1146   PLT 161 10/08/2019 1146   MCV 83 10/08/2019 1146   MCH 26.4 (L) 10/08/2019 1146   MCH  28.2 12/02/2016 2255   MCHC 31.8 10/08/2019 1146   MCHC 31.8 12/02/2016 2255   RDW 13.9 10/08/2019 1146   LYMPHSABS 1.0 10/08/2019 1146   MONOABS 0.5 12/02/2016 2255   EOSABS 0.1 10/08/2019 1146   BASOSABS 0.0 10/08/2019 1146     Diabetic Labs (most recent): Lab Results  Component Value Date   HGBA1C 6.2 05/16/2020   HGBA1C 6.4 01/09/2020   HGBA1C 6.5 10/08/2019    Lipid Panel     Component Value Date/Time   CHOL 172 10/08/2019 1146   CHOL 179 03/22/2013 1707   TRIG 36 10/08/2019 1146   TRIG 54 03/22/2013 1707   HDL 95 10/08/2019 1146   HDL 70 03/22/2013 1707   CHOLHDL 1.8 10/08/2019 1146   LDLCALC 69 10/08/2019 1146   LDLCALC 98 03/22/2013 1707   LABVLDL 8 10/08/2019 1146     Lab Results  Component Value Date   TSH 0.22 (L) 06/10/2020   TSH 0.14 (L) 03/13/2020   TSH 0.04 (L) 11/06/2019   TSH 0.072 (L) 10/08/2019   TSH 0.472 02/21/2017   TSH 0.585 12/14/2015   TSH 0.366 (L) 06/21/2014   TSH 0.019 (L) 06/19/2013   TSH 0.058 (L) 03/22/2013   TSH 0.120 (L) 09/13/2012   FREET4 1.2 06/10/2020   FREET4 1.1 03/13/2020   FREET4 1.3 11/06/2019   FREET4 1.27 06/19/2013   FREET4 1.29 09/13/2012     Thyroid uptake and scan on November 23, 2019 IMPRESSION: Normal 4 hour and 24 hour radio iodine uptakes. Nondiagnostic thyroid imaging, patient unable to lie flat for imaging. Consider ultrasound thyroid gland to assess thyroid morphology, if clinically indicated.   Assessment & Plan:   1. Subclinical hyperthyroidism  2.  Goiter -Her previsit thyroid function tests are consistent with treatment effect with low-dose methimazole.   In light of nondiagnostic thyroid uptake and scan, she will not be considered for ablative treatment.  She is advised to continue methimazole 5 mg p.o. daily with breakfast with plan to repeat thyroid function test and office visit in 3 months.   -Regarding her clinical goiter: Thyroid ultrasound on March 13, 2020 Confirms heterogeneous  thyroid parenchyma with  no discrete nodules.  She is not tachycardic, pulse rate at 76 .  She is on Coreg 3.125 mg p.o. twice daily for hypertension.    -Patient is advised to maintain close follow up with Loman Brooklyn, FNP for primary care needs.     - Time spent on this patient care encounter:  20 minutes of which 50% was spent in  counseling and the rest reviewing  her current and  previous labs / studies and medications  doses and developing a plan for long term care. Sonya Oliver  participated in the discussions, expressed understanding, and voiced agreement with the above plans.  All questions were answered to her satisfaction. she is encouraged to contact clinic should she have any questions or concerns prior to her return visit.    Follow up plan: Return in about 3 months (around 09/25/2020) for F/U with Pre-visit Labs.   Thank you for involving me in the care of this  pleasant patient, and I will continue to update you with her progress.  Glade Lloyd, MD [facility] Phone: [phone]  Fax: [phone]   06/25/2020, 12:56 PM  This note was partially dictated with voice recognition software. Similar sounding words can be transcribed inadequately or may not  be corrected upon review.

## 2020-06-26 ENCOUNTER — Ambulatory Visit (INDEPENDENT_AMBULATORY_CARE_PROVIDER_SITE_OTHER): Payer: Medicare Other

## 2020-06-26 ENCOUNTER — Ambulatory Visit: Payer: Medicare Other

## 2020-06-26 DIAGNOSIS — M810 Age-related osteoporosis without current pathological fracture: Secondary | ICD-10-CM | POA: Diagnosis not present

## 2020-06-26 DIAGNOSIS — I10 Essential (primary) hypertension: Secondary | ICD-10-CM

## 2020-06-26 NOTE — Progress Notes (Signed)
Patient is here today to have her blood pressure checked. Patient was seen by Hendricks Limes on 05/16/2020 for chronic medical conditions.  Her blood pressure at that time was 176/84.  Britney increased her Lisinopril from 5 mg to 10 mg and patient was supposed to follow up in 6 weeks but did not make appointment.  Her blood pressure today was 180/86, pulse 86.  I rechecked it again and it was 172/87.  I scheduled the patient an appointment with you on 07/10/2020.  Is there anything you would like her to do in the meantime?

## 2020-06-26 NOTE — Progress Notes (Signed)
Left message for patient to call back  

## 2020-06-26 NOTE — Progress Notes (Signed)
Please have patient increase lisinopril to 20 mg a day. Keep a BP log at home and follow up in 1 week for a BP check.

## 2020-06-27 ENCOUNTER — Telehealth: Payer: Self-pay

## 2020-06-27 NOTE — Progress Notes (Signed)
Left message to call back  

## 2020-07-07 ENCOUNTER — Other Ambulatory Visit: Payer: Self-pay | Admitting: Family Medicine

## 2020-07-07 DIAGNOSIS — J441 Chronic obstructive pulmonary disease with (acute) exacerbation: Secondary | ICD-10-CM

## 2020-07-10 ENCOUNTER — Ambulatory Visit (INDEPENDENT_AMBULATORY_CARE_PROVIDER_SITE_OTHER): Payer: Medicare Other | Admitting: Family Medicine

## 2020-07-10 ENCOUNTER — Other Ambulatory Visit: Payer: Self-pay

## 2020-07-10 ENCOUNTER — Encounter: Payer: Self-pay | Admitting: Family Medicine

## 2020-07-10 DIAGNOSIS — E118 Type 2 diabetes mellitus with unspecified complications: Secondary | ICD-10-CM

## 2020-07-10 DIAGNOSIS — E119 Type 2 diabetes mellitus without complications: Secondary | ICD-10-CM | POA: Diagnosis not present

## 2020-07-10 DIAGNOSIS — I10 Essential (primary) hypertension: Secondary | ICD-10-CM

## 2020-07-10 DIAGNOSIS — J441 Chronic obstructive pulmonary disease with (acute) exacerbation: Secondary | ICD-10-CM

## 2020-07-10 MED ORDER — ATORVASTATIN CALCIUM 10 MG PO TABS: 10.0000 mg | ORAL_TABLET | Freq: Every day | ORAL | 0 refills | Status: DC

## 2020-07-10 MED ORDER — METFORMIN HCL 500 MG PO TABS: 500.0000 mg | ORAL_TABLET | Freq: Every day | ORAL | 1 refills | Status: DC

## 2020-07-10 MED ORDER — ALBUTEROL SULFATE HFA 108 (90 BASE) MCG/ACT IN AERS: 1.0000 | INHALATION_SPRAY | Freq: Four times a day (QID) | RESPIRATORY_TRACT | 0 refills | Status: DC | PRN

## 2020-07-10 MED ORDER — LISINOPRIL 20 MG PO TABS: 20.0000 mg | ORAL_TABLET | Freq: Every day | ORAL | 3 refills | Status: DC

## 2020-07-10 NOTE — Progress Notes (Signed)
Patient ID: Sonya Oliver, female    DOB: 1941/02/06, 79 y.o.   MRN: [id number]  Chief Complaint:  Hypertension (follow up)   HPI: Sonya Oliver is a 79 y.o. female presenting on 07/10/2020 for Hypertension (follow up)   1. Essential hypertension   2. Type 2 diabetes mellitus without complication, without long-term current use of insulin ([place])    1. HTN Complaint with meds - Yes Checking BP at home ranging 160-172/79-86 Exercising Regularly - no uses walker Watching Salt intake - Yes Pertinent ROS:  Headache - No Chest pain - No Dyspnea - No Palpitations - Yes LE edema - baseline  They report good compliance with medications. No medication side effects She needs a refill on Lipitor, metformin, and albuterol.   PMH: Smoking status noted   Review of Systems Per HPI.   Objective   BP (!) 175/82   Pulse 90   Temp (!) 97.2 F (36.2 C) (Temporal)   Ht 5\' 6"  (1.676 m)   Wt 185 lb 3.2 oz (84 kg)   SpO2 90%   BMI 29.89 kg/m   Physical Exam Vitals and nursing note reviewed.  Constitutional:      General: She is not in acute distress.    Appearance: Normal appearance. She is not ill-appearing, toxic-appearing or diaphoretic.  Neck:     Vascular: No carotid bruit.  Cardiovascular:     Rate and Rhythm: Normal rate and regular rhythm.     Heart sounds: Normal heart sounds. No murmur heard.   Pulmonary:     Effort: Pulmonary effort is normal. No respiratory distress.     Breath sounds: Normal breath sounds.  Abdominal:     General: Bowel sounds are normal. There is no distension.     Palpations: Abdomen is soft.     Tenderness: There is no abdominal tenderness. There is no guarding.  Musculoskeletal:     Cervical back: Normal range of motion and neck supple.     Right lower leg: 1+ Edema present.     Left lower leg: 1+ Edema present.  Skin:    General: Skin is warm and dry.  Neurological:     General: No focal deficit present.     Mental Status: She  is alert and oriented to person, place, and time.  Psychiatric:        Mood and Affect: Mood normal.        Behavior: Behavior normal.       Assessment and Plan: Sonya Oliver was seen today for hypertension.  Diagnoses and all orders for this visit:  Essential hypertension Not controlled. Increase lisinopril to 20 mg daily. BP log given. Follow up in 1 month.  -     lisinopril (ZESTRIL) 20 MG tablet; Take 1 tablet (20 mg total) by mouth daily.  Type 2 diabetes mellitus without complication, without long-term current use of insulin (HCC) Refills provided. Last A1C was 6.2.  -     metFORMIN (GLUCOPHAGE) 500 MG tablet; Take 1 tablet (500 mg total) by mouth daily. -     atorvastatin (LIPITOR) 10 MG tablet; Take 1 tablet (10 mg total) by mouth daily.  Chronic obstructive pulmonary disease with acute exacerbation (HCC) Refill provided.  -     albuterol (VENTOLIN HFA) 108 (90 Base) MCG/ACT inhaler; Inhale 1-2 puffs into the lungs every 6 (six) hours as needed for wheezing or shortness of breath.   Educational handout given for Hypertension.  Follow up in 1 month for  chronic disease management.   The above assessment and management plan was discussed with the patient. The patient verbalized understanding of and has agreed to the management plan. Patient is aware to call the clinic if symptoms persist or worsen. Patient is aware when to return to the clinic for a follow-up visit. Patient educated on when it is appropriate to go to the emergency department.   Marjorie Smolder, FNP-C [facility] [phone]

## 2020-07-10 NOTE — Patient Instructions (Signed)

## 2020-08-02 ENCOUNTER — Other Ambulatory Visit: Payer: Self-pay | Admitting: Family Medicine

## 2020-08-02 DIAGNOSIS — J441 Chronic obstructive pulmonary disease with (acute) exacerbation: Secondary | ICD-10-CM

## 2020-08-07 ENCOUNTER — Encounter: Payer: Self-pay | Admitting: Family Medicine

## 2020-08-07 ENCOUNTER — Ambulatory Visit (INDEPENDENT_AMBULATORY_CARE_PROVIDER_SITE_OTHER): Payer: Medicare Other | Admitting: Family Medicine

## 2020-08-07 ENCOUNTER — Other Ambulatory Visit: Payer: Self-pay

## 2020-08-07 VITALS — BP 160/82 | HR 88 | Temp 98.3°F | Ht 66.0 in | Wt 180.2 lb

## 2020-08-07 DIAGNOSIS — E0590 Thyrotoxicosis, unspecified without thyrotoxic crisis or storm: Secondary | ICD-10-CM | POA: Diagnosis not present

## 2020-08-07 DIAGNOSIS — I10 Essential (primary) hypertension: Secondary | ICD-10-CM | POA: Diagnosis not present

## 2020-08-07 DIAGNOSIS — E119 Type 2 diabetes mellitus without complications: Secondary | ICD-10-CM | POA: Diagnosis not present

## 2020-08-07 MED ORDER — LISINOPRIL 40 MG PO TABS: 40.0000 mg | ORAL_TABLET | Freq: Every day | ORAL | 3 refills | Status: DC

## 2020-08-07 NOTE — Patient Instructions (Signed)

## 2020-08-07 NOTE — Progress Notes (Signed)
Subjective: CC: HTN PCP: Sonya Brooklyn, FNP  ACZ:YSAYTK Sonya Oliver is a 79 y.o. female presenting with her daughter to clinic today for:  1. HTN Sonya Oliver is here for a 4 week follow up after starting on lisinopril 20 mg. She reports that she is doing well on the medication but that her BP is still high at home. Her BP has been around 150-160/80s at home. She is also taking lasix daily and coreg BID. She denies chest pain, worsening shortness of breath, dizziness, visual disturbances, worsening edema, or headaches.   She has an endocrinology appointment next month for hyperthyroidism.  Her last A1c was 6.2 on 05/16/20.  Relevant past medical, surgical, family, and social history reviewed and updated as indicated.  Allergies and medications reviewed and updated.  No Known Allergies Past Medical History:  Diagnosis Date  . Arthritis   . Bladder cancer ([place]) 2015  . Cataract   . CHF (congestive heart failure) ([place])    NO CARDIOLOGIST---  MONITORED BY PCP DR Sonya Grip  . COPD with emphysema ([place])    last Acute Exacerbation 08-21-2014  . H/O pulmonary edema    jan 2014--  acute CHF w/ pulmonary edema  . Hypertension   . Hyperthyroidism    currently no meds -- labs stable  . Nocturia   . Nocturnal oxygen desaturation    uses O2  HS via [place]--  and PRN daytime  . Osteoporosis   . Stroke ([place])   . Type 2 diabetes mellitus ([place])   . Ureteral tumor     Current Outpatient Medications:  .  albuterol (PROVENTIL) (2.5 MG/3ML) 0.083% nebulizer solution, Inhale 3 mLs into the lungs 4 (four) times daily as needed for shortness of breath., Disp: 240 mL, Rfl: 11 .  albuterol (VENTOLIN HFA) 108 (90 Base) MCG/ACT inhaler, INHALE 1-2 PUFFS BY MOUTH EVERY 6 HOURS AS NEEDED FOR WHEEZE OR SHORTNESS OF BREATH, Disp: 8.5 each, Rfl: 0 .  alendronate (FOSAMAX) 70 MG tablet, Take 1 tablet (70 mg total) by mouth once a week. Take with a full glass of water on an empty stomach., Disp: 4 tablet, Rfl: 11 .   atorvastatin (LIPITOR) 10 MG tablet, Take 1 tablet (10 mg total) by mouth daily., Disp: 90 tablet, Rfl: 0 .  carvedilol (COREG) 3.125 MG tablet, Take 1 tablet (3.125 mg total) by mouth 2 (two) times daily., Disp: 180 tablet, Rfl: 3 .  Cholecalciferol (VITAMIN D3) 2000 UNITS TABS, Take 1 tablet by mouth daily., Disp: , Rfl:  .  cycloSPORINE (RESTASIS MULTIDOSE) 0.05 % ophthalmic emulsion, Place 1 drop into both eyes 2 (two) times daily., Disp: 5.5 mL, Rfl: 5 .  Fluticasone-Salmeterol (ADVAIR DISKUS) 100-50 MCG/DOSE AEPB, Inhale 1 puff into the lungs 2 (two) times daily., Disp: 60 each, Rfl: 11 .  furosemide (LASIX) 40 MG tablet, Take 1 tablet (40 mg total) by mouth daily. TAKE 1 TABLET BY MOUTH ONCE DAILY, Disp: 30 tablet, Rfl: 11 .  lisinopril (ZESTRIL) 20 MG tablet, Take 1 tablet (20 mg total) by mouth daily., Disp: 90 tablet, Rfl: 3 .  metFORMIN (GLUCOPHAGE) 500 MG tablet, Take 1 tablet (500 mg total) by mouth daily., Disp: 90 tablet, Rfl: 1 .  methimazole (TAPAZOLE) 5 MG tablet, Take 1 tablet (5 mg total) by mouth daily., Disp: 90 tablet, Rfl: 1 .  potassium chloride SA (KLOR-CON) 20 MEQ tablet, Take 1 tablet (20 mEq total) by mouth daily., Disp: 30 tablet, Rfl: 11 Social History   Socioeconomic History  .  Marital status: Widowed    Spouse name: Not on file  . Number of children: 4  . Years of education: 10  . Highest education level: 10th grade  Occupational History  . 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 Smoking status: Former Smoker    Packs/day: 0.25    Years: 50.00    Pack years: 12.50    Types: Cigarettes    Quit date: 09/24/2006    Years since quitting: 13.8  . Smokeless tobacco: Never Used  Vaping Use  . Vaping Use: Never used  Substance and Sexual Activity  . Alcohol use: No  . Drug use: No  . Sexual activity: Not Currently  Other Topics Concern  . Not on file  Social History Narrative   Occupation: worked in [place] yrs   Social  Determinants of Radio broadcast assistant Strain: Not on [employer] Insecurity: Not on file  Transportation Needs: Not on file  Physical Activity: Not on file  Stress: Not on file  Social Connections: Not on file  Intimate Partner Violence: Not on file   Family History  Problem Relation Age of Onset  . Diabetes Father   . Congestive Heart Failure Father   . COPD Father   . Kidney disease Father   . Hypertension Father   . Diabetes Mother        with kidney disease  . Kidney disease Mother   . Hypertension Mother   . Hyperlipidemia Mother   . Heart attack Mother   . Stroke Mother   . Heart attack Brother   . Asthma Brother   . Diabetes Daughter   . Diabetes Son   . Cancer Brother        throat  . Hypertension Brother     Review of Systems  Per HPI.  Objective: Office vital signs reviewed. BP (!) 160/82   Pulse 88   Temp 98.3 F (36.8 C) (Temporal)   Ht 5' 6"  (1.676 m)   Wt 180 lb 4 oz (81.8 kg)   BMI 29.09 kg/m   Physical Examination:  Physical Exam Vitals and nursing note reviewed.  Constitutional:      General: She is not in acute distress.    Appearance: Normal appearance. She is not ill-appearing, toxic-appearing or diaphoretic.  HENT:     Head: Normocephalic and atraumatic.  Eyes:     Extraocular Movements: Extraocular movements intact.     Conjunctiva/sclera: Conjunctivae normal.     Pupils: Pupils are equal, round, and reactive to light.  Neck:     Vascular: No carotid bruit.  Cardiovascular:     Rate and Rhythm: Normal rate and regular rhythm.     Heart sounds: Normal heart sounds. No murmur heard.   Pulmonary:     Effort: Pulmonary effort is normal. No respiratory distress.     Breath sounds: Normal breath sounds.  Musculoskeletal:     Cervical back: Neck supple.     Right lower leg: Edema present.     Left lower leg: Edema present.  Skin:    General: Skin is warm and dry.  Neurological:     General: No focal deficit present.      Mental Status: She is alert and oriented to person, place, and time.  Psychiatric:        Mood and Affect: Mood normal.        Behavior: Behavior normal.      Results for orders placed or  performed in visit on 06/10/20  TSH  Result Value Ref Range   TSH 0.22 (L) 0.40 - 4.50 mIU/L  T4, free  Result Value Ref Range   Free T4 1.2 0.8 - 1.8 ng/dL  T3, free  Result Value Ref Range   T3, Free 3.2 2.3 - 4.2 pg/mL     Assessment/ Plan: Laranda was seen today for hypertension.  Diagnoses and all orders for this visit:  Essential hypertension Uncontrolled however improved from last visit. Increase lisinopril to 40 mg daily. Labs pending as below to assess for hyperkalemia as she is also on a potassium supplement. Follow up in 1 month, sooner if no improvement or worsening of symptoms.  -     lisinopril (ZESTRIL) 40 MG tablet; Take 1 tablet (40 mg total) by mouth daily. -     CMP14+EGFR -     CBC with Differential  Subclinical hyperthyroidism Managed by endo. Keep scheduled appointment.   Diabetes mellitus without complication ([place]) Well controlled on current regimen.   Follow up in 1 month with PCP for chronic illnesses.   The above assessment and management plan was discussed with the patient. The patient verbalized understanding of and has agreed to the management plan. Patient is aware to call the clinic if symptoms persist or worsen. Patient is aware when to return to the clinic for a follow-up visit. Patient educated on when it is appropriate to go to the emergency department.   Marjorie Smolder, FNP-C [facility] [address] [place],  [id number] [phone]

## 2020-08-08 ENCOUNTER — Ambulatory Visit: Payer: Medicare Other | Admitting: Family Medicine

## 2020-08-08 LAB — CMP14+EGFR
ALT: 8 IU/L (ref 0–32)
AST: 12 IU/L (ref 0–40)
Albumin/Globulin Ratio: 1 — ABNORMAL LOW (ref 1.2–2.2)
Albumin: 3.9 g/dL (ref 3.6–4.6)
Alkaline Phosphatase: 64 IU/L (ref 44–121)
BUN/Creatinine Ratio: 15 (ref 12–28)
BUN: 13 mg/dL (ref 8–27)
Bilirubin Total: 0.7 mg/dL (ref 0.0–1.2)
CO2: 28 mmol/L (ref 20–29)
Calcium: 9.1 mg/dL (ref 8.7–10.3)
Chloride: 100 mmol/L (ref 96–106)
Creatinine, Ser: 0.88 mg/dL (ref 0.57–1.00)
GFR calc Af Amer: 71 mL/min/{1.73_m2} (ref >59)
GFR calc non Af Amer: 62 mL/min/{1.73_m2} (ref >59)
Globulin, Total: 3.9 g/dL (ref 1.5–4.5)
Glucose: 128 mg/dL — ABNORMAL HIGH (ref 65–99)
Potassium: 4.1 mmol/L (ref 3.5–5.2)
Sodium: 142 mmol/L (ref 134–144)
Total Protein: 7.8 g/dL (ref 6.0–8.5)

## 2020-08-08 LAB — CBC WITH DIFFERENTIAL/PLATELET
Basophils Absolute: 0 10*3/uL (ref 0.0–0.2)
Basos: 0 %
EOS (ABSOLUTE): 0 10*3/uL (ref 0.0–0.4)
Eos: 1 %
Hematocrit: 39.4 % (ref 34.0–46.6)
Hemoglobin: 12.3 g/dL (ref 11.1–15.9)
Immature Grans (Abs): 0 10*3/uL (ref 0.0–0.1)
Immature Granulocytes: 0 %
Lymphocytes Absolute: 0.9 10*3/uL (ref 0.7–3.1)
Lymphs: 24 %
MCH: 25.5 pg — ABNORMAL LOW (ref 26.6–33.0)
MCHC: 31.2 g/dL — ABNORMAL LOW (ref 31.5–35.7)
MCV: 82 fL (ref 79–97)
Monocytes Absolute: 0.4 10*3/uL (ref 0.1–0.9)
Monocytes: 10 %
Neutrophils Absolute: 2.4 10*3/uL (ref 1.4–7.0)
Neutrophils: 65 %
Platelets: 163 10*3/uL (ref 150–450)
RBC: 4.82 x10E6/uL (ref 3.77–5.28)
RDW: 13.4 % (ref 11.7–15.4)
WBC: 3.8 10*3/uL (ref 3.4–10.8)

## 2020-08-15 ENCOUNTER — Emergency Department (HOSPITAL_COMMUNITY)
Admission: EM | Admit: 2020-08-15 | Discharge: 2020-08-15 | Disposition: A | Discharge status: Home / Self Care | Payer: Medicare Other | Attending: Emergency Medicine | Admitting: Emergency Medicine

## 2020-08-15 ENCOUNTER — Encounter (HOSPITAL_COMMUNITY): Payer: Self-pay | Admitting: Emergency Medicine

## 2020-08-15 ENCOUNTER — Emergency Department (HOSPITAL_COMMUNITY): Payer: Medicare Other

## 2020-08-15 ENCOUNTER — Other Ambulatory Visit: Payer: Self-pay

## 2020-08-15 DIAGNOSIS — Z8551 Personal history of malignant neoplasm of bladder: Secondary | ICD-10-CM | POA: Insufficient documentation

## 2020-08-15 DIAGNOSIS — Y92009 Unspecified place in unspecified non-institutional (private) residence as the place of occurrence of the external cause: Secondary | ICD-10-CM | POA: Diagnosis not present

## 2020-08-15 DIAGNOSIS — Z7984 Long term (current) use of oral hypoglycemic drugs: Secondary | ICD-10-CM | POA: Insufficient documentation

## 2020-08-15 DIAGNOSIS — I110 Hypertensive heart disease with heart failure: Secondary | ICD-10-CM | POA: Diagnosis not present

## 2020-08-15 DIAGNOSIS — Z87891 Personal history of nicotine dependence: Secondary | ICD-10-CM | POA: Insufficient documentation

## 2020-08-15 DIAGNOSIS — W19XXXA Unspecified fall, initial encounter: Secondary | ICD-10-CM | POA: Diagnosis not present

## 2020-08-15 DIAGNOSIS — Z79899 Other long term (current) drug therapy: Secondary | ICD-10-CM | POA: Insufficient documentation

## 2020-08-15 DIAGNOSIS — I5032 Chronic diastolic (congestive) heart failure: Secondary | ICD-10-CM | POA: Diagnosis not present

## 2020-08-15 DIAGNOSIS — F0390 Unspecified dementia without behavioral disturbance: Secondary | ICD-10-CM | POA: Insufficient documentation

## 2020-08-15 DIAGNOSIS — J441 Chronic obstructive pulmonary disease with (acute) exacerbation: Secondary | ICD-10-CM | POA: Diagnosis not present

## 2020-08-15 DIAGNOSIS — M6281 Muscle weakness (generalized): Secondary | ICD-10-CM | POA: Diagnosis present

## 2020-08-15 DIAGNOSIS — M25561 Pain in right knee: Secondary | ICD-10-CM | POA: Insufficient documentation

## 2020-08-15 DIAGNOSIS — E119 Type 2 diabetes mellitus without complications: Secondary | ICD-10-CM | POA: Insufficient documentation

## 2020-08-15 DIAGNOSIS — M25562 Pain in left knee: Secondary | ICD-10-CM | POA: Insufficient documentation

## 2020-08-15 DIAGNOSIS — R531 Weakness: Secondary | ICD-10-CM

## 2020-08-15 LAB — BASIC METABOLIC PANEL
Anion gap: 10 (ref 5–15)
BUN: 18 mg/dL (ref 8–23)
CO2: 27 mmol/L (ref 22–32)
Calcium: 8.8 mg/dL — ABNORMAL LOW (ref 8.9–10.3)
Chloride: 101 mmol/L (ref 98–111)
Creatinine, Ser: 0.78 mg/dL (ref 0.44–1.00)
GFR, Estimated: >60 mL/min (ref >60)
Glucose, Bld: 65 mg/dL — ABNORMAL LOW (ref 70–99)
Potassium: 4.1 mmol/L (ref 3.5–5.1)
Sodium: 138 mmol/L (ref 135–145)

## 2020-08-15 LAB — URINALYSIS, ROUTINE W REFLEX MICROSCOPIC
Bilirubin Urine: NEGATIVE
Glucose, UA: NEGATIVE mg/dL
Hgb urine dipstick: NEGATIVE
Ketones, ur: 20 mg/dL — AB
Leukocytes,Ua: NEGATIVE
Nitrite: NEGATIVE
Protein, ur: NEGATIVE mg/dL
Specific Gravity, Urine: 1.021 (ref 1.005–1.030)
pH: 5 (ref 5.0–8.0)

## 2020-08-15 LAB — CBC WITH DIFFERENTIAL/PLATELET
Abs Immature Granulocytes: 0.01 10*3/uL (ref 0.00–0.07)
Basophils Absolute: 0 10*3/uL (ref 0.0–0.1)
Basophils Relative: 0 %
Eosinophils Absolute: 0 10*3/uL (ref 0.0–0.5)
Eosinophils Relative: 1 %
HCT: 39.4 % (ref 36.0–46.0)
Hemoglobin: 11.7 g/dL — ABNORMAL LOW (ref 12.0–15.0)
Immature Granulocytes: 0 %
Lymphocytes Relative: 24 %
Lymphs Abs: 0.9 10*3/uL (ref 0.7–4.0)
MCH: 26.4 pg (ref 26.0–34.0)
MCHC: 29.7 g/dL — ABNORMAL LOW (ref 30.0–36.0)
MCV: 88.9 fL (ref 80.0–100.0)
Monocytes Absolute: 0.5 10*3/uL (ref 0.1–1.0)
Monocytes Relative: 13 %
Neutro Abs: 2.4 10*3/uL (ref 1.7–7.7)
Neutrophils Relative %: 62 %
Platelets: 150 10*3/uL (ref 150–400)
RBC: 4.43 MIL/uL (ref 3.87–5.11)
RDW: 14.6 % (ref 11.5–15.5)
WBC: 3.9 10*3/uL — ABNORMAL LOW (ref 4.0–10.5)
nRBC: 0 % (ref 0.0–0.2)

## 2020-08-15 LAB — TROPONIN I (HIGH SENSITIVITY)
Troponin I (High Sensitivity): 6 ng/L (ref <18)
Troponin I (High Sensitivity): 8 ng/L (ref <18)

## 2020-08-15 NOTE — ED Provider Notes (Signed)
[facility] EMERGENCY DEPARTMENT Provider Note   CSN: [id number] Arrival date & time: 08/15/20  1146     History Chief Complaint  Patient presents with  . Fall    Sonya Oliver is a 79 y.o. female.  HPI      BRITTENEY Oliver is a 79 y.o. female with past medical history of CHF, COPD, hypertension, and type 2 diabetes who comes from home via EMS to the ER for evaluation of increasing weakness and fall that occurred earlier this morning.  Patient states that she fell and complaining of bilateral knee pain.  She is unsure of the cause of her fall and she is unable to provide any additional history.    Patient lives with her daughter, Sonya Oliver, whom I have spoken with.  she that her mother has been increasingly confused for several weeks and fell out of bed this morning around 1 AM. Daughter is unsure if patient struck her head.  States that she believes her mother takes a blood thinner, but she is unsure.  (no blood thinner listed on medication list)  She states her mother was complaining of pain to her both her knees and lower back secondary to her fall.  Daughter states that patient is in early stages of dementia.  patient denies other symptoms.     Past Medical History:  Diagnosis Date  . Arthritis   . Bladder cancer ([place]) 2015  . Cataract   . CHF (congestive heart failure) ([place])    NO CARDIOLOGIST---  MONITORED BY PCP DR Jacelyn Grip  . COPD with emphysema ([place])    last Acute Exacerbation 08-21-2014  . H/O pulmonary edema    jan 2014--  acute CHF w/ pulmonary edema  . Hypertension   . Hyperthyroidism    currently no meds -- labs stable  . Nocturia   . Nocturnal oxygen desaturation    uses O2  HS via [place]--  and PRN daytime  . Osteoporosis   . Stroke ([place])   . Type 2 diabetes mellitus ([place])   . Ureteral tumor     Patient Active Problem List   Diagnosis Date Noted  . 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 Chronic diastolic CHF (congestive heart failure) ([place]) 09/29/2016  . History of  smoking 25-50 pack years 07/20/2016  . Aortic atherosclerosis ([place]) 07/20/2016  . Osteoporosis 07/08/2016  . Diabetes mellitus, type 2 ([place]) 12/14/2015  . COPD (chronic obstructive pulmonary disease) ([place]) 12/19/2012  . Vitamin D deficiency 12/19/2012  . HTN (hypertension) 12/19/2012  . Edema leg 09/12/2012  . History of anemia 03/21/2012  . Subclinical hyperthyroidism 02/23/2012    Past Surgical History:  Procedure Laterality Date  . ABDOMINAL HYSTERECTOMY  1980'S   W/ BILATERAL SALPINGO-OOPHORECTOMY  . CATARACT EXTRACTION W/PHACO Left 01/31/2014   Procedure: CATARACT EXTRACTION PHACO AND INTRAOCULAR LENS PLACEMENT (IOC);  Surgeon: Tonny Branch, MD;  Location: [facility];  Service: Ophthalmology;  Laterality: Left;  CDE: 23.78  . CATARACT EXTRACTION W/PHACO Right 02/28/2014   Procedure: CATARACT EXTRACTION PHACO AND INTRAOCULAR LENS PLACEMENT (IOC);  Surgeon: Tonny Branch, MD;  Location: [facility];  Service: Ophthalmology;  Laterality: Right;  CDE:  11.34  . CYSTOSCOPY  03/23/2012   Procedure: CYSTOSCOPY;  Surgeon: Claybon Jabs, MD;  Location: [facility];  Service: Urology;  Laterality: N/A;  . CYSTOSCOPY WITH RETROGRADE PYELOGRAM, URETEROSCOPY AND STENT PLACEMENT Right 10/21/2014   Procedure: CYSTOSCOPY WITH RETROGRADE PYELOGRAM, URETEROSCOPY, BLADDER TUMOR BIOPSY;  Surgeon: Claybon Jabs, MD;  Location: [facility];  Service: Urology;  Laterality: Right;  . TRANSTHORACIC ECHOCARDIOGRAM  06-21-2013   moderate LVH,  grade I diastolic dysfunction, ef 42-35%,  mild MR & TR  . TRANSURETHRAL RESECTION OF BLADDER TUMOR  03/23/2012   Procedure: TRANSURETHRAL RESECTION OF BLADDER TUMOR (TURBT);  Surgeon: Claybon Jabs, MD;  Location: [facility];  Service: Urology;  Laterality: N/A;  . TRANSURETHRAL RESECTION OF BLADDER TUMOR N/A 01/08/2013   Procedure: TRANSURETHRAL RESECTION OF BLADDER TUMOR (TURBT);  Surgeon: Claybon Jabs, MD;  Location: [facility];  Service: Urology;  Laterality:  N/A;     OB History   No obstetric history on file.     Family History  Problem Relation Age of Onset  . Diabetes Father   . Congestive Heart Failure Father   . COPD Father   . Kidney disease Father   . Hypertension Father   . Diabetes Mother        with kidney disease  . Kidney disease Mother   . Hypertension Mother   . Hyperlipidemia Mother   . Heart attack Mother   . Stroke Mother   . Heart attack Brother   . Asthma Brother   . Diabetes Daughter   . Diabetes Son   . Cancer Brother        throat  . Hypertension Brother     Social History   Tobacco Use  . Smoking status: Former Smoker    Packs/day: 0.25    Years: 50.00    Pack years: 12.50    Types: Cigarettes    Quit date: 09/24/2006    Years since quitting: 13.9  . Smokeless tobacco: Never Used  Vaping Use  . Vaping Use: Never used  Substance Use Topics  . Alcohol use: No  . Drug use: No    Home Medications Prior to Admission medications   Medication Sig Start Date End Date Taking? Authorizing Provider  albuterol (PROVENTIL) (2.5 MG/3ML) 0.083% nebulizer solution Inhale 3 mLs into the lungs 4 (four) times daily as needed for shortness of breath. 02/19/19   Terald Sleeper, PA-C  albuterol (VENTOLIN HFA) 108 (90 Base) MCG/ACT inhaler INHALE 1-2 PUFFS BY MOUTH EVERY 6 HOURS AS NEEDED FOR WHEEZE OR SHORTNESS OF BREATH 08/04/20   Ronnie Doss M, DO  alendronate (FOSAMAX) 70 MG tablet Take 1 tablet (70 mg total) by mouth once a week. Take with a full glass of water on an empty stomach. 10/08/19   Terald Sleeper, PA-C  atorvastatin (LIPITOR) 10 MG tablet Take 1 tablet (10 mg total) by mouth daily. 07/10/20   Gwenlyn Perking, FNP  carvedilol (COREG) 3.125 MG tablet Take 1 tablet (3.125 mg total) by mouth 2 (two) times daily. 10/08/19   Terald Sleeper, PA-C  Cholecalciferol (VITAMIN D3) 2000 UNITS TABS Take 1 tablet by mouth daily.    [provider]  cycloSPORINE (RESTASIS MULTIDOSE) 0.05 % ophthalmic  emulsion Place 1 drop into both eyes 2 (two) times daily. 04/23/20   Loman Brooklyn, FNP  Fluticasone-Salmeterol (ADVAIR DISKUS) 100-50 MCG/DOSE AEPB Inhale 1 puff into the lungs 2 (two) times daily. 05/16/20   Loman Brooklyn, FNP  furosemide (LASIX) 40 MG tablet Take 1 tablet (40 mg total) by mouth daily. TAKE 1 TABLET BY MOUTH ONCE DAILY 10/08/19   Terald Sleeper, PA-C  lisinopril (ZESTRIL) 40 MG tablet Take 1 tablet (40 mg total) by mouth daily. 08/07/20   Gwenlyn Perking, FNP  metFORMIN (GLUCOPHAGE) 500 MG  tablet Take 1 tablet (500 mg total) by mouth daily. 07/10/20   Gwenlyn Perking, FNP  methimazole (TAPAZOLE) 5 MG tablet Take 1 tablet (5 mg total) by mouth daily. 06/25/20   Cassandria Anger, MD  potassium chloride SA (KLOR-CON) 20 MEQ tablet Take 1 tablet (20 mEq total) by mouth daily. 10/08/19   Terald Sleeper, PA-C    Allergies    Patient has no known allergies.  Review of Systems   Review of Systems  Unable to perform ROS: Dementia    Physical Exam Updated Vital Signs BP (!) 194/96 (BP Location: Right Arm)   Pulse 80   Temp 98 F (36.7 C) (Oral)   Resp 16   Ht 5\' 6"  (1.676 m)   Wt 81.8 kg   SpO2 100%   BMI 29.11 kg/m   Physical Exam Vitals and nursing note reviewed.  Constitutional:      General: She is not in acute distress.    Appearance: Normal appearance. She is well-developed and well-nourished. She is not toxic-appearing.  HENT:     Head: Atraumatic.     Mouth/Throat:     Mouth: Mucous membranes are moist.  Eyes:     Extraocular Movements: Extraocular movements intact.     Conjunctiva/sclera: Conjunctivae normal.     Pupils: Pupils are equal, round, and reactive to light.  Cardiovascular:     Rate and Rhythm: Normal rate and regular rhythm.     Pulses: Normal pulses and intact distal pulses.  Pulmonary:     Effort: Pulmonary effort is normal.     Breath sounds: Normal breath sounds.  Chest:     Chest wall: No tenderness.  Abdominal:      Palpations: Abdomen is soft.     Tenderness: There is no abdominal tenderness.  Musculoskeletal:        General: Tenderness present.     Cervical back: Normal range of motion and neck supple. No tenderness.     Comments: ttp of the bilateral anterior knee.  No erythema, effusion, or step-off deformity.  No obvious ligamentous instability.  Skin:    General: Skin is warm.     Capillary Refill: Capillary refill takes less than 2 seconds.     Findings: No erythema.  Neurological:     General: No focal deficit present.     Mental Status: She is alert.     Sensory: No sensory deficit.     Motor: No weakness or abnormal muscle tone.     Coordination: Coordination normal.     Comments: CN II-XII intact.  Speech clear, no facial droop. Grip strength symmetrical.  No pronator drift.       ED Results / Procedures / Treatments   Labs (all labs ordered are listed, but only abnormal results are displayed) Labs Reviewed  BASIC METABOLIC PANEL - Abnormal; Notable for the following components:      Result Value   Glucose, Bld 65 (*)    Calcium 8.8 (*)    All other components within normal limits  CBC WITH DIFFERENTIAL/PLATELET - Abnormal; Notable for the following components:   WBC 3.9 (*)    Hemoglobin 11.7 (*)    MCHC 29.7 (*)    All other components within normal limits  URINALYSIS, ROUTINE W REFLEX MICROSCOPIC - Abnormal; Notable for the following components:   APPearance HAZY (*)    Ketones, ur 20 (*)    All other components within normal limits  TROPONIN I (HIGH SENSITIVITY)  TROPONIN  I (HIGH SENSITIVITY)    EKG EKG Interpretation  Date/Time:  Friday August 15 2020 12:06:57 EST Ventricular Rate:  80 PR Interval:    QRS Duration: 104 QT Interval:  399 QTC Calculation: 461 R Axis:   88 Text Interpretation: Sinus rhythm Multiple ventricular premature complexes Prolonged PR interval Borderline right axis deviation Borderline repolarization abnormality ST elevation, consider  inferior injury Confirmed by Fredia Sorrow [phone]) on 08/15/2020 2:12:24 PM   Radiology DG Lumbar Spine Complete  Result Date: 08/15/2020 CLINICAL DATA:  Increased weakness and multiple falls, BILATERAL knee pain, pelvic pain, lower back pain, limited range of motion in knees EXAM: LUMBAR SPINE - COMPLETE 4+ VIEW COMPARISON:  None FINDINGS: Five non-rib-bearing lumbar vertebra. Multilevel disc space narrowing and endplate spur formation. Multilevel facet degenerative changes. Mild anterolisthesis L4-L5 likely due to facet degenerative changes. No fracture, additional subluxation, or bone destruction. No spondylolysis. SI joints poorly visualized. Atherosclerotic calcifications abdominal aorta. 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 No acute abnormalities. Electronically Signed   By: Lavonia Dana M.D.   On: 08/15/2020 15:20   DG Pelvis 1-2 Views  Result Date: 08/15/2020 CLINICAL DATA:  Fall. Increasing weakness with multiple falls. EXAM: PELVIS - 1-2 VIEW COMPARISON:  Reformats from abdominopelvic CT 02/23/2012 FINDINGS: The cortical margins of the bony pelvis are intact. No fracture. Pubic symphysis and sacroiliac joints are congruent. Mild to moderate bilateral hip osteoarthritis. Both femoral heads are well-seated in the respective acetabula. There is a 2.1 cm lucent lesion in the right femoral neck with peripherally sclerotic margins. No prior exams available for comparison. IMPRESSION: 1. No fracture of the pelvis. 2. Mild to moderate bilateral hip osteoarthritis. 3. Peripherally sclerotic 2.1 cm lucent lesion in the right femoral neck. This was not seen on 2013 CT. Differential considerations include subchondral cyst or focal bone lesion. Recommend further evaluation with MRI. Electronically Signed   By: Keith Rake M.D.   On: 08/15/2020 15:00   CT Head Wo Contrast  Result Date: 08/15/2020 CLINICAL DATA:  Minor head  trauma, multiple falls, diabetes mellitus, hypertension, history bladder cancer EXAM: CT HEAD WITHOUT CONTRAST TECHNIQUE: Contiguous axial images were obtained from the base of the skull through the vertex without intravenous contrast. Sagittal and coronal MPR images reconstructed from axial data set. COMPARISON:  03/21/2012 FINDINGS: Brain: Generalized atrophy. Normal ventricular morphology. No midline shift or mass effect. Small vessel chronic ischemic changes of deep cerebral white matter. No intracranial hemorrhage, mass lesion, evidence of acute infarction, or extra-axial fluid collection. Vascular: Atherosclerotic calcifications of internal carotid arteries at skull base. Skull: Calvaria intact Sinuses/Orbits: Clear Other: N/A IMPRESSION: Atrophy with small vessel chronic ischemic changes of deep cerebral white matter. No acute intracranial abnormalities. Electronically Signed   By: Lavonia Dana M.D.   On: 08/15/2020 15:21   DG Knee Complete 4 Views Left  Result Date: 08/15/2020 CLINICAL DATA:  Fall. Bilateral knee pain, increasing weakness with multiple falls. Limited range of motion. EXAM: LEFT KNEE - COMPLETE 4+ VIEW COMPARISON:  Left knee radiograph 11/22/2018 FINDINGS: No acute or healing fracture. Tricompartmental osteoarthritis most prominent in the medial tibiofemoral compartment with prominent joint space loss and peripheral spurring. No significant joint effusion. Small quadriceps and patellar tendon enthesophytes. Mild soft tissue edema. There are vascular calcifications. IMPRESSION: 1. No acute or healing fracture of the left knee. 2. Tricompartmental osteoarthritis, most prominent in the medial tibiofemoral compartment. Degenerative changes are similar to prior exam. Electronically Signed  By: Keith Rake M.D.   On: 08/15/2020 14:55   DG Knee Complete 4 Views Right  Result Date: 08/15/2020 CLINICAL DATA:  Fall. Bilateral knee pain. Increasing weakness and multiple falls. EXAM: RIGHT  KNEE - COMPLETE 4+ VIEW COMPARISON:  None. FINDINGS: No fracture or dislocation. Moderate tricompartmental osteoarthritis with peripheral spurring. Quadriceps tendon enthesophyte. There is a small knee joint effusion. Lateral chondrocalcinosis. Mild soft tissue edema. There are vascular calcifications. IMPRESSION: 1. No fracture or dislocation of the right knee. 2. Moderate tricompartmental osteoarthritis with small joint effusion. 3. Lateral chondrocalcinosis. Electronically Signed   By: Keith Rake M.D.   On: 08/15/2020 14:56    Procedures Procedures (including critical care time)  Medications Ordered in ED Medications - No data to display  ED Course  I have reviewed the triage vital signs and the nursing notes.  Pertinent labs & imaging results that were available during my care of the patient were reviewed by me and considered in my medical decision making (see chart for details).    MDM Rules/Calculators/A&P                          Pt here from home for evaluation of fall at home and generalized weakness.  Pt reports knee pain but denies other symptoms.  Well appearing.  Vitals reassuring.  No focal neuro deficits.  Work up unremarkable.  Doubt emergent process.  Pt has tolerated fluids and snack.  I feel pt appropriate for d/c home with family member, return precautions discussed.   Final Clinical Impression(s) / ED Diagnoses Final diagnoses:  Generalized weakness  Fall in home, initial encounter    Rx / DC Orders ED Discharge Orders    None       Kem Parkinson, PA-C 08/16/20 2205    Fredia Sorrow, MD 09/06/20 [phone]

## 2020-08-15 NOTE — ED Triage Notes (Addendum)
Pt from home via [facility]. Per family pt has been increasingly weak and having multiple falls. Pt C/O bilateral knee pain. Pt chronically on 3l [place].

## 2020-08-15 NOTE — Discharge Instructions (Addendum)
Sonya Oliver blood work, urine tests, and x-rays today were all reassuring.  She will likely have some low back pain and knee pain secondary to her fall.  This may persist for several days.  Give her Tylenol every 4 hours as needed for pain.  Follow-up with her primary care provider for recheck next week.

## 2020-08-24 ENCOUNTER — Other Ambulatory Visit: Payer: Self-pay | Admitting: Family Medicine

## 2020-08-24 DIAGNOSIS — E118 Type 2 diabetes mellitus with unspecified complications: Secondary | ICD-10-CM

## 2020-08-25 ENCOUNTER — Other Ambulatory Visit: Payer: Self-pay | Admitting: *Deleted

## 2020-08-25 DIAGNOSIS — E876 Hypokalemia: Secondary | ICD-10-CM

## 2020-08-25 DIAGNOSIS — M810 Age-related osteoporosis without current pathological fracture: Secondary | ICD-10-CM

## 2020-08-25 DIAGNOSIS — I10 Essential (primary) hypertension: Secondary | ICD-10-CM

## 2020-08-25 MED ORDER — ALENDRONATE SODIUM 70 MG PO TABS
70.0000 mg | ORAL_TABLET | ORAL | 0 refills | Status: DC
Start: 2020-08-25 — End: 2020-09-15

## 2020-08-25 MED ORDER — POTASSIUM CHLORIDE CRYS ER 20 MEQ PO TBCR: 20.0000 meq | EXTENDED_RELEASE_TABLET | Freq: Every day | ORAL | 0 refills | Status: DC

## 2020-08-25 MED ORDER — FUROSEMIDE 40 MG PO TABS: 40.0000 mg | ORAL_TABLET | Freq: Every day | ORAL | 0 refills | Status: DC

## 2020-08-25 MED ORDER — CARVEDILOL 3.125 MG PO TABS: 3.1250 mg | ORAL_TABLET | Freq: Two times a day (BID) | ORAL | 0 refills | Status: DC

## 2020-08-26 ENCOUNTER — Emergency Department (HOSPITAL_COMMUNITY): Payer: Medicare Other

## 2020-08-26 ENCOUNTER — Other Ambulatory Visit: Payer: Self-pay

## 2020-08-26 ENCOUNTER — Observation Stay (HOSPITAL_COMMUNITY): Payer: Medicare Other

## 2020-08-26 ENCOUNTER — Inpatient Hospital Stay (HOSPITAL_COMMUNITY)
Admission: EM | Admit: 2020-08-26 | Discharge: 2020-09-01 | DRG: 177 | Disposition: A | Discharge status: Home Health | Payer: Medicare Other | Attending: Internal Medicine | Admitting: Internal Medicine

## 2020-08-26 ENCOUNTER — Encounter (HOSPITAL_COMMUNITY): Payer: Self-pay | Admitting: Emergency Medicine

## 2020-08-26 DIAGNOSIS — J449 Chronic obstructive pulmonary disease, unspecified: Secondary | ICD-10-CM | POA: Diagnosis present

## 2020-08-26 DIAGNOSIS — R824 Acetonuria: Secondary | ICD-10-CM | POA: Diagnosis present

## 2020-08-26 DIAGNOSIS — Z9841 Cataract extraction status, right eye: Secondary | ICD-10-CM

## 2020-08-26 DIAGNOSIS — M25551 Pain in right hip: Secondary | ICD-10-CM

## 2020-08-26 DIAGNOSIS — Z9842 Cataract extraction status, left eye: Secondary | ICD-10-CM

## 2020-08-26 DIAGNOSIS — R823 Hemoglobinuria: Secondary | ICD-10-CM | POA: Diagnosis present

## 2020-08-26 DIAGNOSIS — I110 Hypertensive heart disease with heart failure: Secondary | ICD-10-CM | POA: Diagnosis present

## 2020-08-26 DIAGNOSIS — Z87891 Personal history of nicotine dependence: Secondary | ICD-10-CM

## 2020-08-26 DIAGNOSIS — Z66 Do not resuscitate: Secondary | ICD-10-CM | POA: Diagnosis present

## 2020-08-26 DIAGNOSIS — I10 Essential (primary) hypertension: Secondary | ICD-10-CM | POA: Diagnosis not present

## 2020-08-26 DIAGNOSIS — F0390 Unspecified dementia without behavioral disturbance: Secondary | ICD-10-CM | POA: Diagnosis present

## 2020-08-26 DIAGNOSIS — E119 Type 2 diabetes mellitus without complications: Secondary | ICD-10-CM | POA: Diagnosis present

## 2020-08-26 DIAGNOSIS — R627 Adult failure to thrive: Secondary | ICD-10-CM

## 2020-08-26 DIAGNOSIS — R809 Proteinuria, unspecified: Secondary | ICD-10-CM | POA: Diagnosis present

## 2020-08-26 DIAGNOSIS — Z90710 Acquired absence of both cervix and uterus: Secondary | ICD-10-CM

## 2020-08-26 DIAGNOSIS — R351 Nocturia: Secondary | ICD-10-CM | POA: Diagnosis present

## 2020-08-26 DIAGNOSIS — R918 Other nonspecific abnormal finding of lung field: Secondary | ICD-10-CM

## 2020-08-26 DIAGNOSIS — M160 Bilateral primary osteoarthritis of hip: Secondary | ICD-10-CM | POA: Diagnosis present

## 2020-08-26 DIAGNOSIS — J1282 Pneumonia due to coronavirus disease 2019: Secondary | ICD-10-CM | POA: Diagnosis present

## 2020-08-26 DIAGNOSIS — Z8551 Personal history of malignant neoplasm of bladder: Secondary | ICD-10-CM

## 2020-08-26 DIAGNOSIS — W19XXXA Unspecified fall, initial encounter: Secondary | ICD-10-CM

## 2020-08-26 DIAGNOSIS — I5032 Chronic diastolic (congestive) heart failure: Secondary | ICD-10-CM | POA: Diagnosis present

## 2020-08-26 DIAGNOSIS — M810 Age-related osteoporosis without current pathological fracture: Secondary | ICD-10-CM | POA: Diagnosis present

## 2020-08-26 DIAGNOSIS — J439 Emphysema, unspecified: Secondary | ICD-10-CM | POA: Diagnosis present

## 2020-08-26 DIAGNOSIS — Z833 Family history of diabetes mellitus: Secondary | ICD-10-CM

## 2020-08-26 DIAGNOSIS — Z9181 History of falling: Secondary | ICD-10-CM

## 2020-08-26 DIAGNOSIS — Z823 Family history of stroke: Secondary | ICD-10-CM

## 2020-08-26 DIAGNOSIS — E0590 Thyrotoxicosis, unspecified without thyrotoxic crisis or storm: Secondary | ICD-10-CM | POA: Diagnosis present

## 2020-08-26 DIAGNOSIS — Z79899 Other long term (current) drug therapy: Secondary | ICD-10-CM

## 2020-08-26 DIAGNOSIS — M1710 Unilateral primary osteoarthritis, unspecified knee: Secondary | ICD-10-CM | POA: Diagnosis present

## 2020-08-26 DIAGNOSIS — Z7984 Long term (current) use of oral hypoglycemic drugs: Secondary | ICD-10-CM

## 2020-08-26 DIAGNOSIS — Z8673 Personal history of transient ischemic attack (TIA), and cerebral infarction without residual deficits: Secondary | ICD-10-CM

## 2020-08-26 DIAGNOSIS — Z961 Presence of intraocular lens: Secondary | ICD-10-CM | POA: Diagnosis present

## 2020-08-26 DIAGNOSIS — Z825 Family history of asthma and other chronic lower respiratory diseases: Secondary | ICD-10-CM

## 2020-08-26 DIAGNOSIS — U071 COVID-19: Secondary | ICD-10-CM | POA: Diagnosis not present

## 2020-08-26 DIAGNOSIS — Z7951 Long term (current) use of inhaled steroids: Secondary | ICD-10-CM

## 2020-08-26 DIAGNOSIS — J9601 Acute respiratory failure with hypoxia: Secondary | ICD-10-CM | POA: Diagnosis present

## 2020-08-26 DIAGNOSIS — Z8249 Family history of ischemic heart disease and other diseases of the circulatory system: Secondary | ICD-10-CM

## 2020-08-26 LAB — COMPREHENSIVE METABOLIC PANEL
ALT: 10 U/L (ref 0–44)
AST: 19 U/L (ref 15–41)
Albumin: 3.6 g/dL (ref 3.5–5.0)
Alkaline Phosphatase: 58 U/L (ref 38–126)
Anion gap: 11 (ref 5–15)
BUN: 12 mg/dL (ref 8–23)
CO2: 26 mmol/L (ref 22–32)
Calcium: 9 mg/dL (ref 8.9–10.3)
Chloride: 97 mmol/L — ABNORMAL LOW (ref 98–111)
Creatinine, Ser: 0.71 mg/dL (ref 0.44–1.00)
GFR, Estimated: >60 mL/min (ref >60)
Glucose, Bld: 71 mg/dL (ref 70–99)
Potassium: 3.7 mmol/L (ref 3.5–5.1)
Sodium: 134 mmol/L — ABNORMAL LOW (ref 135–145)
Total Bilirubin: 1.4 mg/dL — ABNORMAL HIGH (ref 0.3–1.2)
Total Protein: 8.7 g/dL — ABNORMAL HIGH (ref 6.5–8.1)

## 2020-08-26 LAB — CBC WITH DIFFERENTIAL/PLATELET
Abs Immature Granulocytes: 0.02 10*3/uL (ref 0.00–0.07)
Basophils Absolute: 0 10*3/uL (ref 0.0–0.1)
Basophils Relative: 0 %
Eosinophils Absolute: 0 10*3/uL (ref 0.0–0.5)
Eosinophils Relative: 0 %
HCT: 41.3 % (ref 36.0–46.0)
Hemoglobin: 12.5 g/dL (ref 12.0–15.0)
Immature Granulocytes: 1 %
Lymphocytes Relative: 10 %
Lymphs Abs: 0.4 10*3/uL — ABNORMAL LOW (ref 0.7–4.0)
MCH: 26.1 pg (ref 26.0–34.0)
MCHC: 30.3 g/dL (ref 30.0–36.0)
MCV: 86.2 fL (ref 80.0–100.0)
Monocytes Absolute: 0.5 10*3/uL (ref 0.1–1.0)
Monocytes Relative: 13 %
Neutro Abs: 3.2 10*3/uL (ref 1.7–7.7)
Neutrophils Relative %: 76 %
Platelets: 114 10*3/uL — ABNORMAL LOW (ref 150–400)
RBC: 4.79 MIL/uL (ref 3.87–5.11)
RDW: 14.3 % (ref 11.5–15.5)
WBC: 4.2 10*3/uL (ref 4.0–10.5)
nRBC: 0 % (ref 0.0–0.2)

## 2020-08-26 LAB — URINALYSIS, ROUTINE W REFLEX MICROSCOPIC
Bacteria, UA: NONE SEEN
Bilirubin Urine: NEGATIVE
Glucose, UA: NEGATIVE mg/dL
Ketones, ur: 80 mg/dL — AB
Leukocytes,Ua: NEGATIVE
Nitrite: NEGATIVE
Protein, ur: 30 mg/dL — AB
Specific Gravity, Urine: 1.017 (ref 1.005–1.030)
pH: 6 (ref 5.0–8.0)

## 2020-08-26 LAB — BRAIN NATRIURETIC PEPTIDE: B Natriuretic Peptide: 156 pg/mL — ABNORMAL HIGH (ref 0.0–100.0)

## 2020-08-26 LAB — RESP PANEL BY RT-PCR (FLU A&B, COVID) ARPGX2
Influenza A by PCR: NEGATIVE
Influenza B by PCR: NEGATIVE
SARS Coronavirus 2 by RT PCR: POSITIVE — AB

## 2020-08-26 LAB — LACTATE DEHYDROGENASE: LDH: 144 U/L (ref 98–192)

## 2020-08-26 LAB — C-REACTIVE PROTEIN: CRP: 3.4 mg/dL — ABNORMAL HIGH (ref <1.0)

## 2020-08-26 LAB — FIBRINOGEN: Fibrinogen: 536 mg/dL — ABNORMAL HIGH (ref 210–475)

## 2020-08-26 LAB — TRIGLYCERIDES: Triglycerides: 62 mg/dL (ref <150)

## 2020-08-26 LAB — D-DIMER, QUANTITATIVE: D-Dimer, Quant: 1.53 ug/mL-FEU — ABNORMAL HIGH (ref 0.00–0.50)

## 2020-08-26 LAB — LACTIC ACID, PLASMA: Lactic Acid, Venous: 1.1 mmol/L (ref 0.5–1.9)

## 2020-08-26 LAB — PHOSPHORUS: Phosphorus: 2.7 mg/dL (ref 2.5–4.6)

## 2020-08-26 LAB — PROCALCITONIN: Procalcitonin: <0.1 ng/mL

## 2020-08-26 LAB — TROPONIN I (HIGH SENSITIVITY)
Troponin I (High Sensitivity): 17 ng/L (ref <18)
Troponin I (High Sensitivity): 24 ng/L — ABNORMAL HIGH (ref <18)

## 2020-08-26 LAB — FERRITIN: Ferritin: 125 ng/mL (ref 11–307)

## 2020-08-26 LAB — MAGNESIUM: Magnesium: 1.8 mg/dL (ref 1.7–2.4)

## 2020-08-26 MED ORDER — SODIUM CHLORIDE 0.9 % IV SOLN: 100.0000 mg | Freq: Every day | INTRAVENOUS | Status: DC

## 2020-08-26 MED ORDER — SODIUM CHLORIDE 0.9 % IV SOLN
100.0000 mg | Freq: Once | INTRAVENOUS | Status: AC
  Administered 2020-08-26: 100 mg via INTRAVENOUS
  Filled 2020-08-26: qty 20

## 2020-08-26 MED ORDER — SODIUM CHLORIDE 0.9 % IV BOLUS
1000.0000 mL | Freq: Once | INTRAVENOUS | Status: AC
  Administered 2020-08-26: 1000 mL via INTRAVENOUS

## 2020-08-26 MED ORDER — GUAIFENESIN-DM 100-10 MG/5ML PO SYRP
10.0000 mL | ORAL_SOLUTION | ORAL | Status: DC | PRN
Start: 2020-08-26 — End: 2020-09-01

## 2020-08-26 MED ORDER — SODIUM CHLORIDE 0.9 % IV SOLN: 100.0000 mg | INTRAVENOUS | Status: DC

## 2020-08-26 MED ORDER — ASCORBIC ACID 500 MG PO TABS
500.0000 mg | ORAL_TABLET | Freq: Every day | ORAL | Status: DC
  Administered 2020-08-29 – 2020-09-01 (×4): 500 mg via ORAL
  Filled 2020-08-26 (×5): qty 1

## 2020-08-26 MED ORDER — ALBUTEROL SULFATE HFA 108 (90 BASE) MCG/ACT IN AERS
2.0000 | INHALATION_SPRAY | Freq: Four times a day (QID) | RESPIRATORY_TRACT | Status: DC
  Administered 2020-08-27 (×2): 2 via RESPIRATORY_TRACT
  Filled 2020-08-26: qty 6.7

## 2020-08-26 MED ORDER — SODIUM CHLORIDE 0.9 % IV SOLN: 200.0000 mg | Freq: Once | INTRAVENOUS | Status: DC

## 2020-08-26 MED ORDER — HYDROCOD POLST-CPM POLST ER 10-8 MG/5ML PO SUER: 5.0000 mL | Freq: Two times a day (BID) | ORAL | Status: DC | PRN

## 2020-08-26 MED ORDER — SODIUM CHLORIDE 0.9 % IV SOLN
100.0000 mg | Freq: Every day | INTRAVENOUS | Status: AC
  Administered 2020-08-27 – 2020-08-30 (×4): 100 mg via INTRAVENOUS
  Filled 2020-08-26 (×4): qty 20

## 2020-08-26 MED ORDER — ZINC SULFATE 220 (50 ZN) MG PO CAPS
220.0000 mg | ORAL_CAPSULE | Freq: Every day | ORAL | Status: DC
  Administered 2020-08-29 – 2020-09-01 (×4): 220 mg via ORAL
  Filled 2020-08-26 (×5): qty 1

## 2020-08-26 NOTE — H&P (Signed)
History and Physical    Sonya Oliver ZOX:[id number] DOB: 1940-11-09 DOA: 08/26/2020  PCP: Sonya Brooklyn, FNP   Patient coming from: Home.  I have personally briefly reviewed patient's old medical records in [place]  Chief Complaint: Decreased appetite and falls.  HPI: Sonya Oliver is a 79 y.o. female with medical history significant of osteoarthritis, bladder cancer, cataracts, diastolic CHF, COPD/emphysema, hypertension, history of hyperthyroidism, osteoporosis, history of CVA, type II DM, dementia who was brought to the emergency department via EMS after family called due to the patient having decreased oral intake and history of multiple falls.  She follows some simple commands, but does not answer questions.  ED Course: Initial vital signs were temperature 99.9 F, pulse 94, respirations 24, BP 173/94 mmHg O2 sat 92% on room air.  Her COVID-19 test was positive.  Labwork: Her urinalysis shows small hemoglobinuria, ketonuria of 80 and proteinuria 30 mg/dL.  All other urinalysis values were normal.  SARS 2 and influenza PCR was positive for COVID-19.  CBC showed a white count of 4.2, hemoglobin 12.5 g/dL and platelets 114.  Her D-dimer was 1.53 mcg/mL.  Fibrinogen was 536 mg/dL.  CMP shows sodium of 134 and chloride 97 mmol/L, total protein 8.7 g/dL and total bilirubin 1.4 mg/dL.  The rest of the CMP values are unremarkable.  Troponin was 17 and then 24 ng/L.  BNP was 156.0 pg/mL.  Lactic acid was normal.  Procalcitonin was less than 0.10 ng/mL.  Ferritin was 125 ng/mL.  CRP 3.4 mg/dL.  Imaging: CTA chest show a 3.1 cm rounded mass in the posterior left lower lobe.  This is suspicious for neoplasm.  There is small amount of patchy airspace opacity at the right lung base likely due to atelectasis or early infectious etiology.  CT pelvis did not show any acute fracture or dislocation involving the pelvis.  There was large volume of stool within the visualized bowel.  No  evidence of obstruction.  Please see images and full radiology report for further detail.  Review of Systems: As per HPI otherwise all other systems reviewed and are negative.  Past Medical History:  Diagnosis Date  . Arthritis   . Bladder cancer ([place]) 2015  . Cataract   . CHF (congestive heart failure) ([place])    NO CARDIOLOGIST---  MONITORED BY PCP DR Jacelyn Grip  . COPD with emphysema ([place])    last Acute Exacerbation 08-21-2014  . H/O pulmonary edema    jan 2014--  acute CHF w/ pulmonary edema  . Hypertension   . Hyperthyroidism    currently no meds -- labs stable  . Nocturia   . Nocturnal oxygen desaturation    uses O2  HS via [place]--  and PRN daytime  . Osteoporosis   . Stroke ([place])   . Type 2 diabetes mellitus ([place])   . Ureteral tumor    Past Surgical History:  Procedure Laterality Date  . ABDOMINAL HYSTERECTOMY  1980'S   W/ BILATERAL SALPINGO-OOPHORECTOMY  . CATARACT EXTRACTION W/PHACO Left 01/31/2014   Procedure: CATARACT EXTRACTION PHACO AND INTRAOCULAR LENS PLACEMENT (IOC);  Surgeon: Tonny Branch, MD;  Location: [facility];  Service: Ophthalmology;  Laterality: Left;  CDE: 23.78  . CATARACT EXTRACTION W/PHACO Right 02/28/2014   Procedure: CATARACT EXTRACTION PHACO AND INTRAOCULAR LENS PLACEMENT (IOC);  Surgeon: Tonny Branch, MD;  Location: [facility];  Service: Ophthalmology;  Laterality: Right;  CDE:  11.34  . CYSTOSCOPY  03/23/2012   Procedure: CYSTOSCOPY;  Surgeon: Claybon Jabs,  MD;  Location: [facility];  Service: Urology;  Laterality: N/A;  . CYSTOSCOPY WITH RETROGRADE PYELOGRAM, URETEROSCOPY AND STENT PLACEMENT Right 10/21/2014   Procedure: CYSTOSCOPY WITH RETROGRADE PYELOGRAM, URETEROSCOPY, BLADDER TUMOR BIOPSY;  Surgeon: Claybon Jabs, MD;  Location: [facility];  Service: Urology;  Laterality: Right;  . TRANSTHORACIC ECHOCARDIOGRAM  06-21-2013   moderate LVH,  grade I diastolic dysfunction, ef 06-23%,  mild MR & TR  . TRANSURETHRAL RESECTION OF BLADDER TUMOR  03/23/2012    Procedure: TRANSURETHRAL RESECTION OF BLADDER TUMOR (TURBT);  Surgeon: Claybon Jabs, MD;  Location: [facility];  Service: Urology;  Laterality: N/A;  . TRANSURETHRAL RESECTION OF BLADDER TUMOR N/A 01/08/2013   Procedure: TRANSURETHRAL RESECTION OF BLADDER TUMOR (TURBT);  Surgeon: Claybon Jabs, MD;  Location: [facility];  Service: Urology;  Laterality: N/A;   Social History  reports that she quit smoking about 13 years ago. Her smoking use included cigarettes. She has a 12.50 pack-year smoking history. She has never used smokeless tobacco. She reports that she does not drink alcohol and does not use drugs.  No Known Allergies  Family History  Problem Relation Age of Onset  . Diabetes Father   . Congestive Heart Failure Father   . COPD Father   . Kidney disease Father   . Hypertension Father   . Diabetes Mother        with kidney disease  . Kidney disease Mother   . Hypertension Mother   . Hyperlipidemia Mother   . Heart attack Mother   . Stroke Mother   . Heart attack Brother   . Asthma Brother   . Diabetes Daughter   . Diabetes Son   . Cancer Brother        throat  . Hypertension Brother    Prior to Admission medications   Medication Sig Start Date End Date Taking? Authorizing Provider  albuterol (PROVENTIL) (2.5 MG/3ML) 0.083% nebulizer solution Inhale 3 mLs into the lungs 4 (four) times daily as needed for shortness of breath. 02/19/19   Terald Sleeper, PA-C  albuterol (VENTOLIN HFA) 108 (90 Base) MCG/ACT inhaler INHALE 1-2 PUFFS BY MOUTH EVERY 6 HOURS AS NEEDED FOR WHEEZE OR SHORTNESS OF BREATH Patient taking differently: Inhale 1-2 puffs into the lungs every 6 (six) hours as needed. 08/04/20   Janora Norlander, DO  alendronate (FOSAMAX) 70 MG tablet Take 1 tablet (70 mg total) by mouth once a week. Take with a full glass of water on an empty stomach. 08/25/20   Claretta Fraise, MD  atorvastatin (LIPITOR) 10 MG tablet TAKE 1 TABLET BY MOUTH EVERY DAY 08/25/20    Claretta Fraise, MD  carvedilol (COREG) 3.125 MG tablet Take 1 tablet (3.125 mg total) by mouth 2 (two) times daily. 08/25/20   Claretta Fraise, MD  Cholecalciferol (VITAMIN D3) 2000 UNITS TABS Take 1 tablet by mouth daily.    [provider]  cycloSPORINE (RESTASIS MULTIDOSE) 0.05 % ophthalmic emulsion Place 1 drop into both eyes 2 (two) times daily. 04/23/20   Sonya Brooklyn, FNP  Fluticasone-Salmeterol (ADVAIR DISKUS) 100-50 MCG/DOSE AEPB Inhale 1 puff into the lungs 2 (two) times daily. 05/16/20   Sonya Brooklyn, FNP  furosemide (LASIX) 40 MG tablet Take 1 tablet (40 mg total) by mouth daily. 08/25/20   Claretta Fraise, MD  lisinopril (ZESTRIL) 40 MG tablet Take 1 tablet (40 mg total) by mouth daily. 08/07/20   Gwenlyn Perking, FNP  metFORMIN (GLUCOPHAGE) 500 MG tablet  Take 1 tablet (500 mg total) by mouth daily. 07/10/20   Gwenlyn Perking, FNP  methimazole (TAPAZOLE) 5 MG tablet Take 1 tablet (5 mg total) by mouth daily. 06/25/20   Cassandria Anger, MD  potassium chloride SA (KLOR-CON) 20 MEQ tablet Take 1 tablet (20 mEq total) by mouth daily. 08/25/20   Claretta Fraise, MD   Physical Exam: Vitals:   08/26/20 2008  BP: (!) 173/94  Pulse: 94  Resp: (!) 24  Temp: 99.9 F (37.7 C)  TempSrc: Oral  SpO2: 92%   Constitutional: Looks acutely ill, but currently in NAD. Eyes: PERRL, lids and conjunctivae normal ENMT: Mucous membranes are mildly dry. Posterior pharynx clear of any exudate or lesions. Neck: normal, supple, no masses, no thyromegaly Respiratory: Mildly tachypneic.  Decreased breath sounds in bases.  Bilateral scattered crackles.  No wheezing or rhonchi.  No accessory muscle use.  Cardiovascular: Regular rate and rhythm, no murmurs / rubs / gallops. No extremity edema. 2+ pedal pulses. No carotid bruits.  Abdomen: No distention.  Bowel sounds positive.  Soft, no tenderness, no masses palpated. No hepatosplenomegaly.  Musculoskeletal: Generalized weakness.  No  clubbing / cyanosis. Good ROM, no contractures. Normal muscle tone.  Skin: no rashes, lesions, ulcers on very limited dermatological examination. Neurologic: Looks grossly nonfocal. Psychiatric: Sometimes answers simple questions.  Responds to name..   Labs on Admission: I have personally reviewed following labs and imaging studies  CBC: Recent Labs  Lab 08/26/20 2045  WBC 4.2  NEUTROABS 3.2  HGB 12.5  HCT 41.3  MCV 86.2  PLT 114*    Basic Metabolic Panel: Recent Labs  Lab 08/26/20 2045  NA 134*  K 3.7  CL 97*  CO2 26  GLUCOSE 71  BUN 12  CREATININE 0.71  CALCIUM 9.0  MG 1.8  PHOS 2.7    GFR: Estimated Creatinine Clearance: 59.5 mL/min (by C-G formula based on SCr of 0.71 mg/dL).  Liver Function Tests: Recent Labs  Lab 08/26/20 2045  AST 19  ALT 10  ALKPHOS 58  BILITOT 1.4*  PROT 8.7*  ALBUMIN 3.6    Urine analysis:    Component Value Date/Time   COLORURINE YELLOW 08/26/2020 2247   APPEARANCEUR CLEAR 08/26/2020 2247   APPEARANCEUR Clear 02/20/2019 1058   LABSPEC 1.017 08/26/2020 2247   PHURINE 6.0 08/26/2020 2247   GLUCOSEU NEGATIVE 08/26/2020 2247   HGBUR SMALL (A) 08/26/2020 2247   BILIRUBINUR NEGATIVE 08/26/2020 2247   BILIRUBINUR Negative 02/20/2019 1058   KETONESUR 80 (A) 08/26/2020 2247   PROTEINUR 30 (A) 08/26/2020 2247   UROBILINOGEN 1.0 03/21/2012 1936   NITRITE NEGATIVE 08/26/2020 2247   LEUKOCYTESUR NEGATIVE 08/26/2020 2247    Radiological Exams on Admission: DG Chest Port 1 View  Result Date: 08/26/2020 CLINICAL DATA:  Shortness of breath EXAM: PORTABLE CHEST 1 VIEW COMPARISON:  12/22/2016 FINDINGS: Hyperinflation with emphysematous disease. No consolidation or effusion. Mild cardiomegaly with aortic atherosclerosis. No pneumothorax. IMPRESSION: No active disease. Hyperinflation with emphysematous disease. Mild cardiomegaly. Electronically Signed   By: Donavan Foil M.D.   On: 08/26/2020 21:29    EKG: Independently reviewed.   Vent. rate 104 BPM PR interval 172 ms QRS duration 88 ms QT/QTc 338/444 ms P-R-T axes 87 70 69 Sinus tachycardia with Premature atrial complexes Minimal voltage criteria for LVH, may be normal variant ( Cornell product ) Septal infarct , age undetermined Abnormal ECG  Assessment/Plan Principal Problem:   2019 novel coronavirus disease (COVID-19) Admit to telemetry/observation. Continue supplemental oxygen. Flutter  valve and incentive spirometry. Continue bronchodilators. Antitussives as needed. Antiemetics as needed. Acetaminophen as needed. Remdesivir per pharmacy. Dexamethasone 6 mg IVP every 24 hours. Follow-up CBC, CMP and inflammatory markers.  Active Problems:   Mass of lower lobe of left lung Discussed with the daughter. This will be addressed after Covid convalescence.    COPD (chronic obstructive pulmonary disease) ([place]) Supplemental oxygen as needed. Bronchodilators as needed.    HTN (hypertension) Continue carvedilol 3.125 mg p.o. daily. Continue lisinopril 40 mg p.o. daily.    Diabetes mellitus, type 2 (HCC) Carbohydrate modified diet. CBG monitoring AC and HS with RI SS. Check hemoglobin A1c.    Chronic diastolic CHF (congestive heart failure) (HCC) No signs of decompensation at this time. Continue carvedilol 3.125 mg p.o. daily. Continue lisinopril 40 mg p.o. daily.   DVT prophylaxis: Lovenox SQ. Code Status:   Full code. Family Communication:  I spoke and updated to her daughter [name]. Disposition Plan:   Patient is from:  Home.  Anticipated DC to:  TBD.  Anticipated DC date:  08/27/2020.  Anticipated DC barriers: Clinical status.  Consults called:  Transition of care team. Admission status:  Observation/telemetry.  Severity of Illness:  High due to recent history of falls, decreased oral intake and multiple risk factors for severe COVID-19 disease.  Reubin Milan MD [facility]  How to contact the [facility] Attending or  Consulting provider [place] or covering provider during after hours [place], for this patient?   1. Check the care team in [facility] and look for a) attending/consulting [facility] provider listed and b) the [facility] team listed 2. Log into [email or web address] and use [place]'s universal password to access. If you do not have the password, please contact the hospital operator. 3. Locate the [facility] provider you are looking for under [facility] and page to a number that you can be directly reached. 4. If you still have difficulty reaching the provider, please page the [facility] (Director on Call) for the Hospitalists listed on amion for assistance.  08/26/2020, 11:51 PM   This document was prepared using Dragon voice recognition software and may contain some unintended transcription errors.

## 2020-08-26 NOTE — ED Notes (Signed)
Critical Lab Value Covid Positive  Report taken by RN M.Brame  MD Melina Copa Notified

## 2020-08-26 NOTE — ED Provider Notes (Signed)
[facility] EMERGENCY DEPARTMENT Provider Note   CSN: [id number] Arrival date & time: 08/26/20  1959     History Chief Complaint  Patient presents with  . Hypertension    Sonya Oliver is a 79 y.o. female.  Level 5 caveat secondary to dementia.  79 year old female lives at home with daughter.  Per EMS daughter called because the patient has not been eating or drinking and has had elevated blood pressure.  Reportedly also there have been multiple falls and right hip pain.  Patient denies hip pain to me but is obviously breathing fast and moaning.  She will not answer whether she is having any trouble breathing.  Per her record in epic she was here about 10 days ago for confusion and falls, generalized weakness.  Ultimately was discharged back to home. The history is provided by the patient, the EMS personnel and a relative.  Shortness of Breath Severity:  Unable to specify Onset quality:  Unable to specify Timing:  Unable to specify Progression:  Unable to specify Chronicity:  New Relieved by:  None tried Worsened by:  Nothing Ineffective treatments:  None tried      Past Medical History:  Diagnosis Date  . Arthritis   . Bladder cancer ([place]) 2015  . Cataract   . CHF (congestive heart failure) ([place])    NO CARDIOLOGIST---  MONITORED BY PCP DR Jacelyn Grip  . COPD with emphysema ([place])    last Acute Exacerbation 08-21-2014  . H/O pulmonary edema    jan 2014--  acute CHF w/ pulmonary edema  . Hypertension   . Hyperthyroidism    currently no meds -- labs stable  . Nocturia   . Nocturnal oxygen desaturation    uses O2  HS via [place]--  and PRN daytime  . Osteoporosis   . Stroke ([place])   . Type 2 diabetes mellitus ([place])   . Ureteral tumor     Patient Active Problem List   Diagnosis Date Noted  . 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 Chronic diastolic CHF (congestive heart failure) ([place]) 09/29/2016  . History of smoking 25-50 pack years 07/20/2016  . Aortic atherosclerosis ([place])  07/20/2016  . Osteoporosis 07/08/2016  . Diabetes mellitus, type 2 ([place]) 12/14/2015  . COPD (chronic obstructive pulmonary disease) ([place]) 12/19/2012  . Vitamin D deficiency 12/19/2012  . HTN (hypertension) 12/19/2012  . Edema leg 09/12/2012  . History of anemia 03/21/2012  . Subclinical hyperthyroidism 02/23/2012    Past Surgical History:  Procedure Laterality Date  . ABDOMINAL HYSTERECTOMY  1980'S   W/ BILATERAL SALPINGO-OOPHORECTOMY  . CATARACT EXTRACTION W/PHACO Left 01/31/2014   Procedure: CATARACT EXTRACTION PHACO AND INTRAOCULAR LENS PLACEMENT (IOC);  Surgeon: Tonny Branch, MD;  Location: [facility];  Service: Ophthalmology;  Laterality: Left;  CDE: 23.78  . CATARACT EXTRACTION W/PHACO Right 02/28/2014   Procedure: CATARACT EXTRACTION PHACO AND INTRAOCULAR LENS PLACEMENT (IOC);  Surgeon: Tonny Branch, MD;  Location: [facility];  Service: Ophthalmology;  Laterality: Right;  CDE:  11.34  . CYSTOSCOPY  03/23/2012   Procedure: CYSTOSCOPY;  Surgeon: Claybon Jabs, MD;  Location: [facility];  Service: Urology;  Laterality: N/A;  . CYSTOSCOPY WITH RETROGRADE PYELOGRAM, URETEROSCOPY AND STENT PLACEMENT Right 10/21/2014   Procedure: CYSTOSCOPY WITH RETROGRADE PYELOGRAM, URETEROSCOPY, BLADDER TUMOR BIOPSY;  Surgeon: Claybon Jabs, MD;  Location: [facility];  Service: Urology;  Laterality: Right;  . TRANSTHORACIC ECHOCARDIOGRAM  06-21-2013   moderate LVH,  grade I diastolic dysfunction, ef 41-96%,  mild MR &  TR  . TRANSURETHRAL RESECTION OF BLADDER TUMOR  03/23/2012   Procedure: TRANSURETHRAL RESECTION OF BLADDER TUMOR (TURBT);  Surgeon: Claybon Jabs, MD;  Location: [facility];  Service: Urology;  Laterality: N/A;  . TRANSURETHRAL RESECTION OF BLADDER TUMOR N/A 01/08/2013   Procedure: TRANSURETHRAL RESECTION OF BLADDER TUMOR (TURBT);  Surgeon: Claybon Jabs, MD;  [facility];  Service: Urology;  Laterality: N/A;     OB History   No obstetric history on file.      Family History  Problem Relation Age of Onset  . Diabetes Father   . Congestive Heart Failure Father   . COPD Father   . Kidney disease Father   . Hypertension Father   . Diabetes Mother        with kidney disease  . Kidney disease Mother   . Hypertension Mother   . Hyperlipidemia Mother   . Heart attack Mother   . Stroke Mother   . Heart attack Brother   . Asthma Brother   . Diabetes Daughter   . Diabetes Son   . Cancer Brother        throat  . Hypertension Brother     Social History   Tobacco Use  . Smoking status: Former Smoker    Packs/day: 0.25    Years: 50.00    Pack years: 12.50    Types: Cigarettes    Quit date: 09/24/2006    Years since quitting: 13.9  . Smokeless tobacco: Never Used  Vaping Use  . Vaping Use: Never used  Substance Use Topics  . Alcohol use: No  . Drug use: No    Home Medications Prior to Admission medications   Medication Sig Start Date End Date Taking? Authorizing Provider  albuterol (PROVENTIL) (2.5 MG/3ML) 0.083% nebulizer solution Inhale 3 mLs into the lungs 4 (four) times daily as needed for shortness of breath. 02/19/19   Terald Sleeper, PA-C  albuterol (VENTOLIN HFA) 108 (90 Base) MCG/ACT inhaler INHALE 1-2 PUFFS BY MOUTH EVERY 6 HOURS AS NEEDED FOR WHEEZE OR SHORTNESS OF BREATH 08/04/20   Ronnie Doss M, DO  alendronate (FOSAMAX) 70 MG tablet Take 1 tablet (70 mg total) by mouth once a week. Take with a full glass of water on an empty stomach. 08/25/20   Claretta Fraise, MD  atorvastatin (LIPITOR) 10 MG tablet TAKE 1 TABLET BY MOUTH EVERY DAY 08/25/20   Claretta Fraise, MD  carvedilol (COREG) 3.125 MG tablet Take 1 tablet (3.125 mg total) by mouth 2 (two) times daily. 08/25/20   Claretta Fraise, MD  Cholecalciferol (VITAMIN D3) 2000 UNITS TABS Take 1 tablet by mouth daily.    [provider]  cycloSPORINE (RESTASIS MULTIDOSE) 0.05 % ophthalmic emulsion Place 1 drop into both eyes 2 (two) times daily. 04/23/20   Loman Brooklyn, FNP  Fluticasone-Salmeterol (ADVAIR DISKUS) 100-50 MCG/DOSE AEPB Inhale 1 puff into the lungs 2 (two) times daily. 05/16/20   Loman Brooklyn, FNP  furosemide (LASIX) 40 MG tablet Take 1 tablet (40 mg total) by mouth daily. 08/25/20   Claretta Fraise, MD  lisinopril (ZESTRIL) 40 MG tablet Take 1 tablet (40 mg total) by mouth daily. 08/07/20   Gwenlyn Perking, FNP  metFORMIN (GLUCOPHAGE) 500 MG tablet Take 1 tablet (500 mg total) by mouth daily. 07/10/20   Gwenlyn Perking, FNP  methimazole (TAPAZOLE) 5 MG tablet Take 1 tablet (5 mg total) by mouth daily. 06/25/20   Cassandria Anger, MD  potassium  chloride SA (KLOR-CON) 20 MEQ tablet Take 1 tablet (20 mEq total) by mouth daily. 08/25/20   Claretta Fraise, MD    Allergies    Patient has no known allergies.  Review of Systems   Review of Systems  Unable to perform ROS: Mental status change  Respiratory: Positive for shortness of breath.     Physical Exam Updated Vital Signs BP (!) 173/94 (BP Location: Left Arm)   Pulse 94   Temp 99.9 F (37.7 C) (Oral)   Resp (!) 24   SpO2 92%   Physical Exam Vitals and nursing note reviewed.  Constitutional:      General: She is not in acute distress.    Appearance: Normal appearance. She is well-developed and well-nourished.  HENT:     Head: Normocephalic and atraumatic.  Eyes:     Conjunctiva/sclera: Conjunctivae normal.  Cardiovascular:     Rate and Rhythm: Normal rate and regular rhythm.     Heart sounds: No murmur heard.   Pulmonary:     Effort: Pulmonary effort is normal. Tachypnea present. No respiratory distress.     Breath sounds: Normal breath sounds.  Abdominal:     Palpations: Abdomen is soft.     Tenderness: There is no abdominal tenderness.  Musculoskeletal:        General: No deformity.     Cervical back: Neck supple.     Right lower leg: Edema present.     Left lower leg: Edema present.  Skin:    General: Skin is warm and dry.     Capillary Refill:  Capillary refill takes less than 2 seconds.  Neurological:     General: No focal deficit present.     Mental Status: She is alert.     Comments: Patient is awake.  She inconsistently will answer questions.  She is following commands.  Moving everything nonfocal he.  No gross facial droop.  Psychiatric:        Mood and Affect: Mood and affect normal.     ED Results / Procedures / Treatments   Labs (all labs ordered are listed, but only abnormal results are displayed) Labs Reviewed  RESP PANEL BY RT-PCR (FLU A&B, COVID) ARPGX2 - Abnormal; Notable for the following components:      Result Value   SARS Coronavirus 2 by RT PCR POSITIVE (*)    All other components within normal limits  CBC WITH DIFFERENTIAL/PLATELET - Abnormal; Notable for the following components:   Platelets 114 (*)    Lymphs Abs 0.4 (*)    All other components within normal limits  COMPREHENSIVE METABOLIC PANEL - Abnormal; Notable for the following components:   Sodium 134 (*)    Chloride 97 (*)    Total Protein 8.7 (*)    Total Bilirubin 1.4 (*)    All other components within normal limits  D-DIMER, QUANTITATIVE (NOT AT [facility]) - Abnormal; Notable for the following components:   D-Dimer, Quant 1.53 (*)    All other components within normal limits  FIBRINOGEN - Abnormal; Notable for the following components:   Fibrinogen 536 (*)    All other components within normal limits  C-REACTIVE PROTEIN - Abnormal; Notable for the following components:   CRP 3.4 (*)    All other components within normal limits  BRAIN NATRIURETIC PEPTIDE - Abnormal; Notable for the following components:   B Natriuretic Peptide 156.0 (*)    All other components within normal limits  URINALYSIS, ROUTINE W REFLEX MICROSCOPIC - Abnormal; Notable  for the following components:   Hgb urine dipstick SMALL (*)    Ketones, ur 80 (*)    Protein, ur 30 (*)    All other components within normal limits  D-DIMER, QUANTITATIVE (NOT AT [facility]) - Abnormal;  Notable for the following components:   D-Dimer, Quant 3.63 (*)    All other components within normal limits  C-REACTIVE PROTEIN - Abnormal; Notable for the following components:   CRP 4.1 (*)    All other components within normal limits  COMPREHENSIVE METABOLIC PANEL - Abnormal; Notable for the following components:   Sodium 134 (*)    Calcium 8.4 (*)    Albumin 3.3 (*)    All other components within normal limits  CBC WITH DIFFERENTIAL/PLATELET - Abnormal; Notable for the following components:   Hemoglobin 11.8 (*)    MCH 25.9 (*)    MCHC 29.9 (*)    Platelets 104 (*)    Lymphs Abs 0.6 (*)    All other components within normal limits  TROPONIN I (HIGH SENSITIVITY) - Abnormal; Notable for the following components:   Troponin I (High Sensitivity) 24 (*)    All other components within normal limits  CULTURE, BLOOD (ROUTINE X 2)  CULTURE, BLOOD (ROUTINE X 2)  LACTIC ACID, PLASMA  PROCALCITONIN  LACTATE DEHYDROGENASE  FERRITIN  TRIGLYCERIDES  FERRITIN  MAGNESIUM  PHOSPHORUS  TROPONIN I (HIGH SENSITIVITY)    EKG EKG Interpretation  Date/Time:  Tuesday August 26 2020 20:35:43 EST Ventricular Rate:  104 PR Interval:  172 QRS Duration: 88 QT Interval:  338 QTC Calculation: 444 R Axis:   70 Text Interpretation: Sinus tachycardia with Premature atrial complexes Minimal voltage criteria for LVH, may be normal variant ( Cornell product ) Septal infarct , age undetermined Abnormal ECG increased rate and nonspecific st/ts compared with prior 12/21 Confirmed by Aletta Edouard [phone]) on 08/26/2020 8:50:19 PM   Radiology CT Angio Chest PE W/Cm &/Or Wo Cm  Result Date: 08/27/2020 CLINICAL DATA:  Shortness of breath EXAM: CT ANGIOGRAPHY CHEST WITH CONTRAST TECHNIQUE: Multidetector CT imaging of the chest was performed using the standard protocol during bolus administration of intravenous contrast. Multiplanar CT image reconstructions and MIPs were obtained to evaluate the  vascular anatomy. CONTRAST:  153mL OMNIPAQUE IOHEXOL 350 MG/ML SOLN COMPARISON:  None. FINDINGS: Cardiovascular: There is a optimal opacification of the pulmonary arteries. There is no central,segmental, or subsegmental filling defects within the pulmonary arteries. The heart is normal in size. No pericardial effusion or thickening. No evidence right heart strain. There is normal three-vessel brachiocephalic anatomy without proximal stenosis. The thoracic aorta is normal in appearance. Mediastinum/Nodes: No hilar, mediastinal, or axillary adenopathy. Thyroid gland, trachea, and esophagus demonstrate no significant findings. Lungs/Pleura: A rounded 3.1 cm mass is seen within the posterior left lower lobe. Centrilobular emphysematous changes are seen. A small amount of patchy airspace opacity seen at the right lung base. Upper Abdomen: No acute abnormalities present in the visualized portions of the upper abdomen. Musculoskeletal: No chest wall abnormality. No acute or significant osseous findings. Review of the MIP images confirms the above findings. IMPRESSION: No central, segmental, or subsegmental pulmonary embolism. 3.1 cm rounded mass in the posterior left lower lobe. This could represent a primary pulmonary neoplasm. Small amount of patchy airspace opacity at the right lung base, likely due to atelectasis and/or early infectious etiology. Emphysema (ICD10-J43.9). Electronically Signed   By: Prudencio Pair M.D.   On: 08/27/2020 03:03   CT PELVIS WO CONTRAST  Result Date:  08/27/2020 CLINICAL DATA:  Multiple falls, right hip pain EXAM: CT PELVIS WITHOUT CONTRAST TECHNIQUE: Multidetector CT imaging of the pelvis was performed following the standard protocol without intravenous contrast. COMPARISON:  None. FINDINGS: Urinary Tract:  No abnormality visualized. Bowel: There is large volume stool seen within the visualized large bowel. No evidence of obstruction or focal inflammation. No free fluid within the pelvis.  Vascular/Lymphatic: Moderate aortoiliac atherosclerotic calcification. No aortic aneurysm. No pathologic adenopathy within the abdomen and pelvis. Reproductive:  Uterus absent.  No adnexal masses. Other:  Rectum unremarkable.  No abdominal wall hernia identified. Musculoskeletal: The osseous structures are intact. No evidence of fracture or dislocation. Degenerative changes are seen within the lumbar spine. There is degenerative enthesopathy involving the insertion of the hamstrings bilaterally. IMPRESSION: No acute fracture or dislocation involving the pelvis. Large volume stool within the a visualized bowel within the pelvis. No evidence of obstruction. Aortic Atherosclerosis (ICD10-I70.0). Electronically Signed   By: Fidela Salisbury MD   On: 08/27/2020 02:56   DG Chest Port 1 View  Result Date: 08/26/2020 CLINICAL DATA:  Shortness of breath EXAM: PORTABLE CHEST 1 VIEW COMPARISON:  12/22/2016 FINDINGS: Hyperinflation with emphysematous disease. No consolidation or effusion. Mild cardiomegaly with aortic atherosclerosis. No pneumothorax. IMPRESSION: No active disease. Hyperinflation with emphysematous disease. Mild cardiomegaly. Electronically Signed   By: Donavan Foil M.D.   On: 08/26/2020 21:29   DG Knee Left Port  Result Date: 08/27/2020 CLINICAL DATA:  Multiple falls EXAM: PORTABLE LEFT KNEE - 1-2 VIEW COMPARISON:  08/15/2020 FINDINGS: Negative for fracture.  Normal alignment. Moderate degenerative change in the medial joint compartment with joint space narrowing and spurring. Mild degenerative change laterally and in the patella. Small joint effusion. Atherosclerotic calcification. IMPRESSION: Negative for fracture. Electronically Signed   By: Franchot Gallo M.D.   On: 08/27/2020 08:08    Procedures Procedures (including critical care time)  Medications Ordered in ED Medications  ascorbic acid (VITAMIN C) tablet 500 mg (500 mg Oral Not Given 08/27/20 1037)  zinc sulfate capsule 220 mg (220  mg Oral Not Given 08/27/20 1037)  guaiFENesin-dextromethorphan (ROBITUSSIN DM) 100-10 MG/5ML syrup 10 mL (has no administration in time range)  chlorpheniramine-HYDROcodone (TUSSIONEX) 10-8 MG/5ML suspension 5 mL (has no administration in time range)  remdesivir 100 mg in sodium chloride 0.9 % 100 mL IVPB (0 mg Intravenous Stopped 08/27/20 0101)    Followed by  remdesivir 100 mg in sodium chloride 0.9 % 100 mL IVPB (0 mg Intravenous Stopped 08/27/20 0101)    Followed by  remdesivir 100 mg in sodium chloride 0.9 % 100 mL IVPB (0 mg Intravenous Stopped 08/27/20 1200)  enoxaparin (LOVENOX) injection 40 mg (40 mg Subcutaneous Given 08/27/20 0615)  acetaminophen (TYLENOL) tablet 650 mg (has no administration in time range)    Or  acetaminophen (TYLENOL) suppository 650 mg (has no administration in time range)  prochlorperazine (COMPAZINE) injection 5 mg (has no administration in time range)  methylPREDNISolone sodium succinate (SOLU-MEDROL) 40 mg/mL injection 40 mg (40 mg Intravenous Given 08/27/20 1028)  famotidine (PEPCID) IVPB 20 mg premix (has no administration in time range)  hydrALAZINE (APRESOLINE) injection 10 mg (has no administration in time range)  albuterol (VENTOLIN HFA) 108 (90 Base) MCG/ACT inhaler 2 puff (has no administration in time range)  sodium chloride 0.9 % bolus 1,000 mL (0 mLs Intravenous Stopped 08/27/20 0342)  iohexol (OMNIPAQUE) 350 MG/ML injection 100 mL (100 mLs Intravenous Contrast Given 08/27/20 0250)  dexamethasone (DECADRON) injection 6 mg (6 mg Intravenous Given  08/27/20 0256)    ED Course  I have reviewed the triage vital signs and the nursing notes.  Pertinent labs & imaging results that were available during my care of the patient were reviewed by me and considered in my medical decision making (see chart for details).  Clinical Course as of 08/27/20 1241  Tue Aug 26, 2020  2211 I updated the patient's daughter.  She says she can be confused to time but  otherwise is usually conversant.  She was also noticing her work of breathing increasing.  She had her Covid vaccine but had did not get her booster.  I let her know that we will try to admit her to the hospital so we can get her hydration status and her mental status improved.  She asked that we call if any changes. [MB]    Clinical Course User Index [MB] Hayden Rasmussen, MD   MDM Rules/Calculators/A&P                         ELETHA CULBERTSON was evaluated in Emergency Department on 08/26/2020 for the symptoms described in the history of present illness. She was evaluated in the context of the global COVID-19 pandemic, which necessitated consideration that the patient might be at risk for infection with the SARS-CoV-2 virus that causes COVID-19. Institutional protocols and algorithms that pertain to the evaluation of patients at risk for COVID-19 are in a state of rapid change based on information released by regulatory bodies including the CDC and federal and state organizations. These policies and algorithms were followed during the patient's care in the ED.  This patient complains of possible right hip pain frequent falls not eating and drinking; this involves an extensive number of treatment Options and is a complaint that carries with it a high risk of complications and Morbidity. The differential includes failure to thrive, worsening dementia, altered mental status, metabolic derangement, infection, Covid  I ordered, reviewed and interpreted labs, which included CBC with normal white count normal hemoglobin, platelets low which is new, chemistries fairly unremarkable, D-dimer elevated, Covid testing positive, urinalysis reflecting some dehydration but no obvious signs of infection, blood cultures are pending, lactate not elevated, troponin mildly elevated  I ordered medication IV fluids I ordered imaging studies which included chest x-ray and I independently    visualized and interpreted  imaging which showed no gross infiltrates Additional history obtained from patient's daughter and EMS Previous records obtained and reviewed in epic including recent ED visit I consulted Dr. Olevia Bowens [facility] hospital[facility] and discussed lab and imaging findings  Critical Interventions: None  After the interventions stated above, I reevaluated the patient and found patient to be hemodynamically stable.  She is not requiring oxygen currently.  Reviewed case with Dr. Olevia Bowens and he will evaluate whether starting the patient on steroids and remdesivir as indicated.  Due to her decline in function altered mental status I feel she needs to be admitted to the hospital for further management of her symptoms.   Final Clinical Impression(s) / ED Diagnoses Final diagnoses:  COVID-19 virus infection  FTT (failure to thrive) in adult    Rx / DC Orders ED Discharge Orders    None       Hayden Rasmussen, MD 08/27/20 1245

## 2020-08-26 NOTE — ED Triage Notes (Signed)
Family called ems due to pt not eating or drinking and elevated BP. Pt has been having multiple falls and also c/o right hip pain.

## 2020-08-26 NOTE — ED Notes (Signed)
Pts family states pts BP has been elevated & she is not eating or drinking. Pt stated her name & DOB.

## 2020-08-27 ENCOUNTER — Encounter (HOSPITAL_COMMUNITY): Payer: Self-pay | Admitting: Internal Medicine

## 2020-08-27 ENCOUNTER — Observation Stay (HOSPITAL_COMMUNITY): Payer: Medicare Other

## 2020-08-27 DIAGNOSIS — M810 Age-related osteoporosis without current pathological fracture: Secondary | ICD-10-CM | POA: Diagnosis present

## 2020-08-27 DIAGNOSIS — M160 Bilateral primary osteoarthritis of hip: Secondary | ICD-10-CM | POA: Diagnosis present

## 2020-08-27 DIAGNOSIS — Z9841 Cataract extraction status, right eye: Secondary | ICD-10-CM | POA: Diagnosis not present

## 2020-08-27 DIAGNOSIS — R918 Other nonspecific abnormal finding of lung field: Secondary | ICD-10-CM

## 2020-08-27 DIAGNOSIS — I10 Essential (primary) hypertension: Secondary | ICD-10-CM

## 2020-08-27 DIAGNOSIS — I110 Hypertensive heart disease with heart failure: Secondary | ICD-10-CM | POA: Diagnosis present

## 2020-08-27 DIAGNOSIS — J439 Emphysema, unspecified: Secondary | ICD-10-CM | POA: Diagnosis present

## 2020-08-27 DIAGNOSIS — Z961 Presence of intraocular lens: Secondary | ICD-10-CM | POA: Diagnosis present

## 2020-08-27 DIAGNOSIS — J9601 Acute respiratory failure with hypoxia: Secondary | ICD-10-CM | POA: Diagnosis present

## 2020-08-27 DIAGNOSIS — Z90710 Acquired absence of both cervix and uterus: Secondary | ICD-10-CM | POA: Diagnosis not present

## 2020-08-27 DIAGNOSIS — Z8551 Personal history of malignant neoplasm of bladder: Secondary | ICD-10-CM | POA: Diagnosis not present

## 2020-08-27 DIAGNOSIS — J1282 Pneumonia due to coronavirus disease 2019: Secondary | ICD-10-CM | POA: Diagnosis present

## 2020-08-27 DIAGNOSIS — R823 Hemoglobinuria: Secondary | ICD-10-CM | POA: Diagnosis present

## 2020-08-27 DIAGNOSIS — E119 Type 2 diabetes mellitus without complications: Secondary | ICD-10-CM | POA: Diagnosis present

## 2020-08-27 DIAGNOSIS — R824 Acetonuria: Secondary | ICD-10-CM | POA: Diagnosis present

## 2020-08-27 DIAGNOSIS — E0590 Thyrotoxicosis, unspecified without thyrotoxic crisis or storm: Secondary | ICD-10-CM | POA: Diagnosis present

## 2020-08-27 DIAGNOSIS — M1710 Unilateral primary osteoarthritis, unspecified knee: Secondary | ICD-10-CM | POA: Diagnosis present

## 2020-08-27 DIAGNOSIS — Z9181 History of falling: Secondary | ICD-10-CM | POA: Diagnosis not present

## 2020-08-27 DIAGNOSIS — Z9842 Cataract extraction status, left eye: Secondary | ICD-10-CM | POA: Diagnosis not present

## 2020-08-27 DIAGNOSIS — I5032 Chronic diastolic (congestive) heart failure: Secondary | ICD-10-CM | POA: Diagnosis present

## 2020-08-27 DIAGNOSIS — Z8673 Personal history of transient ischemic attack (TIA), and cerebral infarction without residual deficits: Secondary | ICD-10-CM | POA: Diagnosis not present

## 2020-08-27 DIAGNOSIS — R351 Nocturia: Secondary | ICD-10-CM | POA: Diagnosis present

## 2020-08-27 DIAGNOSIS — U071 COVID-19: Secondary | ICD-10-CM | POA: Diagnosis present

## 2020-08-27 DIAGNOSIS — Z66 Do not resuscitate: Secondary | ICD-10-CM | POA: Diagnosis present

## 2020-08-27 DIAGNOSIS — R809 Proteinuria, unspecified: Secondary | ICD-10-CM | POA: Diagnosis present

## 2020-08-27 DIAGNOSIS — F0390 Unspecified dementia without behavioral disturbance: Secondary | ICD-10-CM | POA: Diagnosis present

## 2020-08-27 LAB — FERRITIN: Ferritin: 146 ng/mL (ref 11–307)

## 2020-08-27 LAB — CBC WITH DIFFERENTIAL/PLATELET
Abs Immature Granulocytes: 0.02 10*3/uL (ref 0.00–0.07)
Basophils Absolute: 0 10*3/uL (ref 0.0–0.1)
Basophils Relative: 0 %
Eosinophils Absolute: 0 10*3/uL (ref 0.0–0.5)
Eosinophils Relative: 0 %
HCT: 39.5 % (ref 36.0–46.0)
Hemoglobin: 11.8 g/dL — ABNORMAL LOW (ref 12.0–15.0)
Immature Granulocytes: 1 %
Lymphocytes Relative: 14 %
Lymphs Abs: 0.6 10*3/uL — ABNORMAL LOW (ref 0.7–4.0)
MCH: 25.9 pg — ABNORMAL LOW (ref 26.0–34.0)
MCHC: 29.9 g/dL — ABNORMAL LOW (ref 30.0–36.0)
MCV: 86.6 fL (ref 80.0–100.0)
Monocytes Absolute: 0.6 10*3/uL (ref 0.1–1.0)
Monocytes Relative: 15 %
Neutro Abs: 2.8 10*3/uL (ref 1.7–7.7)
Neutrophils Relative %: 70 %
Platelets: 104 10*3/uL — ABNORMAL LOW (ref 150–400)
RBC: 4.56 MIL/uL (ref 3.87–5.11)
RDW: 14.5 % (ref 11.5–15.5)
WBC: 4 10*3/uL (ref 4.0–10.5)
nRBC: 0 % (ref 0.0–0.2)

## 2020-08-27 LAB — COMPREHENSIVE METABOLIC PANEL
ALT: 10 U/L (ref 0–44)
AST: 17 U/L (ref 15–41)
Albumin: 3.3 g/dL — ABNORMAL LOW (ref 3.5–5.0)
Alkaline Phosphatase: 50 U/L (ref 38–126)
Anion gap: 9 (ref 5–15)
BUN: 10 mg/dL (ref 8–23)
CO2: 27 mmol/L (ref 22–32)
Calcium: 8.4 mg/dL — ABNORMAL LOW (ref 8.9–10.3)
Chloride: 98 mmol/L (ref 98–111)
Creatinine, Ser: 0.69 mg/dL (ref 0.44–1.00)
GFR, Estimated: >60 mL/min (ref >60)
Glucose, Bld: 80 mg/dL (ref 70–99)
Potassium: 3.8 mmol/L (ref 3.5–5.1)
Sodium: 134 mmol/L — ABNORMAL LOW (ref 135–145)
Total Bilirubin: 1 mg/dL (ref 0.3–1.2)
Total Protein: 7.7 g/dL (ref 6.5–8.1)

## 2020-08-27 LAB — D-DIMER, QUANTITATIVE: D-Dimer, Quant: 3.63 ug/mL-FEU — ABNORMAL HIGH (ref 0.00–0.50)

## 2020-08-27 LAB — C-REACTIVE PROTEIN: CRP: 4.1 mg/dL — ABNORMAL HIGH (ref <1.0)

## 2020-08-27 MED ORDER — ENOXAPARIN SODIUM 40 MG/0.4ML ~~LOC~~ SOLN
40.0000 mg | SUBCUTANEOUS | Status: DC
  Administered 2020-08-27 – 2020-08-28 (×2): 40 mg via SUBCUTANEOUS
  Filled 2020-08-27 (×3): qty 0.4

## 2020-08-27 MED ORDER — ACETAMINOPHEN 650 MG RE SUPP: 650.0000 mg | Freq: Four times a day (QID) | RECTAL | Status: DC | PRN

## 2020-08-27 MED ORDER — FAMOTIDINE IN NACL 20-0.9 MG/50ML-% IV SOLN
20.0000 mg | INTRAVENOUS | Status: DC
  Administered 2020-08-27 – 2020-08-28 (×2): 20 mg via INTRAVENOUS
  Filled 2020-08-27 (×2): qty 50

## 2020-08-27 MED ORDER — METHYLPREDNISOLONE SODIUM SUCC 40 MG IJ SOLR
40.0000 mg | Freq: Two times a day (BID) | INTRAMUSCULAR | Status: DC
  Administered 2020-08-27 – 2020-08-31 (×9): 40 mg via INTRAVENOUS
  Filled 2020-08-27 (×9): qty 1

## 2020-08-27 MED ORDER — ALBUTEROL SULFATE HFA 108 (90 BASE) MCG/ACT IN AERS: 2.0000 | INHALATION_SPRAY | Freq: Two times a day (BID) | RESPIRATORY_TRACT | Status: DC

## 2020-08-27 MED ORDER — DEXAMETHASONE SODIUM PHOSPHATE 10 MG/ML IJ SOLN
6.0000 mg | Freq: Once | INTRAMUSCULAR | Status: AC
  Administered 2020-08-27: 03:00:00 6 mg via INTRAVENOUS
  Filled 2020-08-27: qty 1

## 2020-08-27 MED ORDER — ALBUTEROL SULFATE HFA 108 (90 BASE) MCG/ACT IN AERS: 2.0000 | INHALATION_SPRAY | RESPIRATORY_TRACT | Status: DC | PRN

## 2020-08-27 MED ORDER — IOHEXOL 350 MG/ML SOLN
100.0000 mL | Freq: Once | INTRAVENOUS | Status: AC | PRN
  Administered 2020-08-27: 03:00:00 100 mL via INTRAVENOUS

## 2020-08-27 MED ORDER — PROCHLORPERAZINE EDISYLATE 10 MG/2ML IJ SOLN: 5.0000 mg | INTRAMUSCULAR | Status: DC | PRN

## 2020-08-27 MED ORDER — HYDRALAZINE HCL 20 MG/ML IJ SOLN: 10.0000 mg | Freq: Three times a day (TID) | INTRAMUSCULAR | Status: DC | PRN

## 2020-08-27 MED ORDER — ACETAMINOPHEN 325 MG PO TABS: 650.0000 mg | ORAL_TABLET | Freq: Four times a day (QID) | ORAL | Status: DC | PRN

## 2020-08-27 NOTE — ED Notes (Signed)
Pt continues to await room assignment from bed control

## 2020-08-27 NOTE — Progress Notes (Signed)
SLP Cancellation Note  Patient Details Name: Sonya Oliver MRN: [id number] DOB: July 17, 1941   Cancelled treatment:       Reason Eval/Treat Not Completed: Fatigue/lethargy limiting ability to participate;Patient's level of consciousness. ST will continue efforts, Thank you  Tanekia Ryans H. Roddie Mc, CCC-SLP Speech Language Pathologist   Wende Bushy 08/27/2020, 3:27 PM

## 2020-08-27 NOTE — ED Notes (Signed)
Dr. Dyann Kief notified of patient being unable to take pills by mouth.

## 2020-08-27 NOTE — ED Notes (Signed)
Call to resp re: ability to use inhaler as pt is very lethargic

## 2020-08-27 NOTE — Progress Notes (Addendum)
PROGRESS NOTE    Sonya Oliver  IHW:[id number] DOB: 1941/02/08 DOA: 08/26/2020 PCP: Loman Brooklyn, FNP   Chief Complaint  Patient presents with  . Hypertension    Brief Narrative:  As per H&P written by Dr. Olevia Bowens on 08/26/2020 Sonya Oliver is a 79 y.o. female with medical history significant of osteoarthritis, bladder cancer, cataracts, diastolic CHF, COPD/emphysema, hypertension, history of hyperthyroidism, osteoporosis, history of CVA, type II DM, dementia who was brought to the emergency department via EMS after family called due to the patient having decreased oral intake and history of multiple falls.  She follows some simple commands, but does not answer questions.  ED Course: Initial vital signs were temperature 99.9 F, pulse 94, respirations 24, BP 173/94 mmHg O2 sat 92% on room air.  Her COVID-19 test was positive.  Labwork: Her urinalysis shows small hemoglobinuria, ketonuria of 80 and proteinuria 30 mg/dL.  All other urinalysis values were normal.  SARS 2 and influenza PCR was positive for COVID-19.  CBC showed a white count of 4.2, hemoglobin 12.5 g/dL and platelets 114.  Her D-dimer was 1.53 mcg/mL.  Fibrinogen was 536 mg/dL.  CMP shows sodium of 134 and chloride 97 mmol/L, total protein 8.7 g/dL and total bilirubin 1.4 mg/dL.  The rest of the CMP values are unremarkable.  Troponin was 17 and then 24 ng/L.  BNP was 156.0 pg/mL.  Lactic acid was normal.  Procalcitonin was less than 0.10 ng/mL.  Ferritin was 125 ng/mL.  CRP 3.4 mg/dL.  Imaging: CTA chest show a 3.1 cm rounded mass in the posterior left lower lobe.  This is suspicious for neoplasm.  There is small amount of patchy airspace opacity at the right lung base likely due to atelectasis or early infectious etiology.  CT pelvis did not show any acute fracture or dislocation involving the pelvis.  There was large volume of stool within the visualized bowel.  No evidence of obstruction.  Please see images and full  radiology report for further detail.  Assessment & Plan: 1-acute respiratory failure with hypoxia in the setting of 2019 novel coronavirus infection: -Continue to follow inflammatory markers -Continue remdesivir and steroids; day 2 out of 5 of remdesivir -Continue as needed bronchodilators and supportive care -Wean off oxygen supplementation as tolerated.  2-COPD -Currently no wheezing -Continue treatment with steroids and bronchodilators as mentioned above  3-hypertension -Blood pressure stable -Currently unable to safely tolerate p.o. medications -Will use as needed hydralazine.  4-GERD/GI prophylaxis -Will use IV famotidine  5-dysphagia -Unable to safely take oral medications: -Will keep n.p.o. except for ice chips and sips of water when fully alert -Speech therapy has been consulted.  6-type 2 diabetes mellitus -Continue to follow CBGs and use a sliding scale insulin as needed -Will check A1c.  7-chronic diastolic heart failure -Appears to be compensated at this time -Continue to follow daily weights and strict I's and O's.  8-left lung lower lobe mass -Characteristic of imaging findings suggestive of neoplasm. -This will require outpatient further work-up and examination once COVID-19 condition treated and stabilized.   DVT prophylaxis: Lovenox Code Status: DNR Family Communication: No family at bedside.  Unable to reach patient's daughter over the phone [name]). Disposition:   Status is: Inpatient  Dispo: The patient is from: Home              Anticipated d/c is to: To be determined              Anticipated d/c date  is: To be determined              Patient currently not medically stable for discharge; patient still requiring oxygen supplementation and at this time having trouble swallowing; will get a speech therapy involved, continue steroids and remdesivir, continue as needed bronchodilators and wean off oxygen supplementation as tolerated.  Prognosis  is guarded.    Consultants:   None  Procedures:  See below for x-ray reports.   Antimicrobials/antiviral:  Remdesivir   Subjective: Obtunded, answering yes or no intermittently and elicit no response to painful stimuli.  Protecting airways okay but unsafe to swallow medications currently.  Patient is afebrile.  Patient is using around 4 L nasal cannula supplementation at this time.  Objective: Vitals:   08/27/20 0200 08/27/20 0330 08/27/20 0600 08/27/20 0810  BP: (!) 155/90 (!) 172/84 (!) 181/91   Pulse: (!) 103 85 92   Resp: (!) 33 18 18   Temp:      TempSrc:      SpO2:  95% 100% 100%   No intake or output data in the 24 hours ending 08/27/20 0931 There were no vitals filed for this visit.  Examination:  General exam: Appears calm and comfortable; afebrile, reports no chest pain, nausea or vomiting.  Obtunded, and essentially responding to chills painful stimuli.  Intermittent yes or no has been elicited but without consistency. Patient is requiring 4-5 L nasal cannula supplementation currently. Respiratory system: No wheezing, positive rhonchi bilaterally; no using accessory muscle. Cardiovascular system: S1 & S2 heard, RRR. No JVD, no rubs, no gallops, no pedal edema. Gastrointestinal system: Abdomen is nondistended, soft and nontender. No organomegaly or masses felt. Normal bowel sounds heard. Central nervous system: Alert and oriented. No focal neurological deficits. Extremities: No cyanosis or clubbing Skin: No petechiae. Psychiatry: Mood & affect appropriate.     Data Reviewed: I have personally reviewed following labs and imaging studies  CBC: Recent Labs  Lab 08/26/20 2045 08/27/20 0439  WBC 4.2 4.0  NEUTROABS 3.2 2.8  HGB 12.5 11.8*  HCT 41.3 39.5  MCV 86.2 86.6  PLT 114* 104*    Basic Metabolic Panel: Recent Labs  Lab 08/26/20 2045 08/27/20 0439  NA 134* 134*  K 3.7 3.8  CL 97* 98  CO2 26 27  GLUCOSE 71 80  BUN 12 10  CREATININE 0.71  0.69  CALCIUM 9.0 8.4*  MG 1.8  --   PHOS 2.7  --     GFR: Estimated Creatinine Clearance: 59.5 mL/min (by C-G formula based on SCr of 0.69 mg/dL).  Liver Function Tests: Recent Labs  Lab 08/26/20 2045 08/27/20 0439  AST 19 17  ALT 10 10  ALKPHOS 58 50  BILITOT 1.4* 1.0  PROT 8.7* 7.7  ALBUMIN 3.6 3.3*    CBG: No results for input(s): GLUCAP in the last 168 hours.   Recent Results (from the past 240 hour(s))  Resp Panel by RT-PCR (Flu A&B, Covid) Nasopharyngeal Swab     Status: Abnormal   Collection Time: 08/26/20  8:44 PM   Specimen: Nasopharyngeal Swab; Nasopharyngeal(NP) swabs in vial transport medium  Result Value Ref Range Status   SARS Coronavirus 2 by RT PCR POSITIVE (A) NEGATIVE Final    Comment: RESULT CALLED TO, READ BACK BY AND VERIFIED WITH: BRAME,M @ 2156 ON 08/26/20 BY JUW (NOTE) SARS-CoV-2 target nucleic acids are DETECTED.  The SARS-CoV-2 RNA is generally detectable in upper respiratory specimens during the acute phase of infection. Positive results are indicative  of the presence of the identified virus, but do not rule out bacterial infection or co-infection with other pathogens not detected by the test. Clinical correlation with patient history and other diagnostic information is necessary to determine patient infection status. The expected result is Negative.  Fact Sheet for Patients: EntrepreneurPulse.com.au  Fact Sheet for Healthcare Providers: IncredibleEmployment.be  This test is not yet approved or cleared by the [place] FDA and  has been authorized for detection and/or diagnosis of SARS-CoV-2 by FDA under an Emergency Use Authorization (EUA).  This EUA will remain in effect (meaning this test can be  used) for the duration of  the COVID-19 declaration under Section 564(b)(1) of the Act, 21 U.S.C. section 360bbb-3(b)(1), unless the authorization is terminated or revoked sooner.     Influenza A  by PCR NEGATIVE NEGATIVE Final   Influenza B by PCR NEGATIVE NEGATIVE Final    Comment: (NOTE) The Xpert Xpress SARS-CoV-2/FLU/RSV plus assay is intended as an aid in the diagnosis of influenza from Nasopharyngeal swab specimens and should not be used as a sole basis for treatment. Nasal washings and aspirates are unacceptable for Xpert Xpress SARS-CoV-2/FLU/RSV testing.  Fact Sheet for Patients: EntrepreneurPulse.com.au  Fact Sheet for Healthcare Providers: IncredibleEmployment.be  This test is not yet approved or cleared by the [place] FDA and has been authorized for detection and/or diagnosis of SARS-CoV-2 by FDA under an Emergency Use Authorization (EUA). This EUA will remain in effect (meaning this test can be used) for the duration of the COVID-19 declaration under Section 564(b)(1) of the Act, 21 U.S.C. section 360bbb-3(b)(1), unless the authorization is terminated or revoked.  Performed at [facility], [address], [place], [place] [address]   Blood Culture (routine x 2)     Status: None (Preliminary result)   Collection Time: 08/26/20  8:45 PM   Specimen: BLOOD LEFT ARM  Result Value Ref Range Status   Specimen Description BLOOD LEFT ARM  Final   Special Requests   Final    BOTTLES DRAWN AEROBIC AND ANAEROBIC Blood Culture adequate volume Performed at [facility], [address] [place], [place] [address]    Culture PENDING  Incomplete   Report Status PENDING  Incomplete  Blood Culture (routine x 2)     Status: None (Preliminary result)   Collection Time: 08/26/20  9:09 PM   Specimen: BLOOD RIGHT ARM  Result Value Ref Range Status   Specimen Description BLOOD RIGHT ARM  Final   Special Requests   Final    BOTTLES DRAWN AEROBIC AND ANAEROBIC Blood Culture adequate volume Performed at [facility], [address], [place], [place] [address]    Culture PENDING  Incomplete   Report Status PENDING  Incomplete      Radiology Studies: CT Angio Chest PE W/Cm &/Or Wo Cm  Result Date: 08/27/2020 CLINICAL DATA:  Shortness of breath EXAM: CT ANGIOGRAPHY CHEST WITH CONTRAST TECHNIQUE: Multidetector CT imaging of the chest was performed using the standard protocol during bolus administration of intravenous contrast. Multiplanar CT image reconstructions and MIPs were obtained to evaluate the vascular anatomy. CONTRAST:  174mL OMNIPAQUE IOHEXOL 350 MG/ML SOLN COMPARISON:  None. FINDINGS: Cardiovascular: There is a optimal opacification of the pulmonary arteries. There is no central,segmental, or subsegmental filling defects within the pulmonary arteries. The heart is normal in size. No pericardial effusion or thickening. No evidence right heart strain. There is normal three-vessel brachiocephalic anatomy without proximal stenosis. The thoracic aorta is normal in appearance. Mediastinum/Nodes: No hilar, mediastinal,  or axillary adenopathy. Thyroid gland, trachea, and esophagus demonstrate no significant findings. Lungs/Pleura: A rounded 3.1 cm mass is seen within the posterior left lower lobe. Centrilobular emphysematous changes are seen. A small amount of patchy airspace opacity seen at the right lung base. Upper Abdomen: No acute abnormalities present in the visualized portions of the upper abdomen. Musculoskeletal: No chest wall abnormality. No acute or significant osseous findings. Review of the MIP images confirms the above findings. IMPRESSION: No central, segmental, or subsegmental pulmonary embolism. 3.1 cm rounded mass in the posterior left lower lobe. This could represent a primary pulmonary neoplasm. Small amount of patchy airspace opacity at the right lung base, likely due to atelectasis and/or early infectious etiology. Emphysema (ICD10-J43.9). Electronically Signed   By: Prudencio Pair M.D.   On: 08/27/2020 03:03   CT PELVIS WO CONTRAST  Result Date: 08/27/2020 CLINICAL DATA:  Multiple falls, right hip pain EXAM:  CT PELVIS WITHOUT CONTRAST TECHNIQUE: Multidetector CT imaging of the pelvis was performed following the standard protocol without intravenous contrast. COMPARISON:  None. FINDINGS: Urinary Tract:  No abnormality visualized. Bowel: There is large volume stool seen within the visualized large bowel. No evidence of obstruction or focal inflammation. No free fluid within the pelvis. Vascular/Lymphatic: Moderate aortoiliac atherosclerotic calcification. No aortic aneurysm. No pathologic adenopathy within the abdomen and pelvis. Reproductive:  Uterus absent.  No adnexal masses. Other:  Rectum unremarkable.  No abdominal wall hernia identified. Musculoskeletal: The osseous structures are intact. No evidence of fracture or dislocation. Degenerative changes are seen within the lumbar spine. There is degenerative enthesopathy involving the insertion of the hamstrings bilaterally. IMPRESSION: No acute fracture or dislocation involving the pelvis. Large volume stool within the a visualized bowel within the pelvis. No evidence of obstruction. Aortic Atherosclerosis (ICD10-I70.0). Electronically Signed   By: Fidela Salisbury MD   On: 08/27/2020 02:56   DG Chest Port 1 View  Result Date: 08/26/2020 CLINICAL DATA:  Shortness of breath EXAM: PORTABLE CHEST 1 VIEW COMPARISON:  12/22/2016 FINDINGS: Hyperinflation with emphysematous disease. No consolidation or effusion. Mild cardiomegaly with aortic atherosclerosis. No pneumothorax. IMPRESSION: No active disease. Hyperinflation with emphysematous disease. Mild cardiomegaly. Electronically Signed   By: Donavan Foil M.D.   On: 08/26/2020 21:29   DG Knee Left Port  Result Date: 08/27/2020 CLINICAL DATA:  Multiple falls EXAM: PORTABLE LEFT KNEE - 1-2 VIEW COMPARISON:  08/15/2020 FINDINGS: Negative for fracture.  Normal alignment. Moderate degenerative change in the medial joint compartment with joint space narrowing and spurring. Mild degenerative change laterally and in the  patella. Small joint effusion. Atherosclerotic calcification. IMPRESSION: Negative for fracture. Electronically Signed   By: Franchot Gallo M.D.   On: 08/27/2020 08:08    Scheduled Meds: . albuterol  2 puff Inhalation Q6H  . vitamin C  500 mg Oral Daily  . enoxaparin (LOVENOX) injection  40 mg Subcutaneous Q24H  . zinc sulfate  220 mg Oral Daily   Continuous Infusions: . remdesivir 100 mg in NS 100 mL    . remdesivir 100 mg in NS 100 mL       LOS: 0 days    Time spent: 30 minutes   Barton Dubois, MD [facility]   To contact the attending provider between 7A-7P or the covering provider during after hours 7P-7A, please log into the web site [email or web address] and access using universal [place] password for that web site. If you do not have the password, please call the hospital operator.  08/27/2020, 9:31 AM

## 2020-08-27 NOTE — ED Notes (Signed)
Geriatric covid   Held in ED over 24 hours   Awaiting room assignment from bed control

## 2020-08-27 NOTE — ED Notes (Signed)
Pt soiled   incontinent of urine and stool  Cleaned changed and repositioned  Warm blankets

## 2020-08-27 NOTE — ED Notes (Signed)
Speech therapy down to eval   Pt could not follow commands not respond   Pt continues to be NPO

## 2020-08-28 DIAGNOSIS — I5032 Chronic diastolic (congestive) heart failure: Secondary | ICD-10-CM | POA: Diagnosis not present

## 2020-08-28 DIAGNOSIS — E119 Type 2 diabetes mellitus without complications: Secondary | ICD-10-CM | POA: Diagnosis not present

## 2020-08-28 DIAGNOSIS — U071 COVID-19: Secondary | ICD-10-CM | POA: Diagnosis not present

## 2020-08-28 DIAGNOSIS — J9601 Acute respiratory failure with hypoxia: Secondary | ICD-10-CM | POA: Diagnosis not present

## 2020-08-28 LAB — CBC WITH DIFFERENTIAL/PLATELET
Abs Immature Granulocytes: 0.02 10*3/uL (ref 0.00–0.07)
Basophils Absolute: 0 10*3/uL (ref 0.0–0.1)
Basophils Relative: 0 %
Eosinophils Absolute: 0 10*3/uL (ref 0.0–0.5)
Eosinophils Relative: 0 %
HCT: 44.9 % (ref 36.0–46.0)
Hemoglobin: 13.3 g/dL (ref 12.0–15.0)
Immature Granulocytes: 1 %
Lymphocytes Relative: 20 %
Lymphs Abs: 0.5 10*3/uL — ABNORMAL LOW (ref 0.7–4.0)
MCH: 25.9 pg — ABNORMAL LOW (ref 26.0–34.0)
MCHC: 29.6 g/dL — ABNORMAL LOW (ref 30.0–36.0)
MCV: 87.5 fL (ref 80.0–100.0)
Monocytes Absolute: 0.3 10*3/uL (ref 0.1–1.0)
Monocytes Relative: 10 %
Neutro Abs: 1.7 10*3/uL (ref 1.7–7.7)
Neutrophils Relative %: 69 %
Platelets: 110 10*3/uL — ABNORMAL LOW (ref 150–400)
RBC: 5.13 MIL/uL — ABNORMAL HIGH (ref 3.87–5.11)
RDW: 13.9 % (ref 11.5–15.5)
WBC: 2.4 10*3/uL — ABNORMAL LOW (ref 4.0–10.5)
nRBC: 0 % (ref 0.0–0.2)

## 2020-08-28 LAB — BLOOD CULTURE ID PANEL (REFLEXED) - BCID2

## 2020-08-28 LAB — COMPREHENSIVE METABOLIC PANEL
ALT: 15 U/L (ref 0–44)
AST: 26 U/L (ref 15–41)
Albumin: 3.3 g/dL — ABNORMAL LOW (ref 3.5–5.0)
Alkaline Phosphatase: 49 U/L (ref 38–126)
Anion gap: 11 (ref 5–15)
BUN: 13 mg/dL (ref 8–23)
CO2: 31 mmol/L (ref 22–32)
Calcium: 8.9 mg/dL (ref 8.9–10.3)
Chloride: 96 mmol/L — ABNORMAL LOW (ref 98–111)
Creatinine, Ser: 0.66 mg/dL (ref 0.44–1.00)
GFR, Estimated: >60 mL/min (ref >60)
Glucose, Bld: 121 mg/dL — ABNORMAL HIGH (ref 70–99)
Potassium: 4.2 mmol/L (ref 3.5–5.1)
Sodium: 138 mmol/L (ref 135–145)
Total Bilirubin: 0.8 mg/dL (ref 0.3–1.2)
Total Protein: 8.1 g/dL (ref 6.5–8.1)

## 2020-08-28 LAB — C-REACTIVE PROTEIN: CRP: 5.7 mg/dL — ABNORMAL HIGH (ref <1.0)

## 2020-08-28 LAB — D-DIMER, QUANTITATIVE: D-Dimer, Quant: 3.51 ug/mL-FEU — ABNORMAL HIGH (ref 0.00–0.50)

## 2020-08-28 LAB — FERRITIN: Ferritin: 167 ng/mL (ref 11–307)

## 2020-08-28 NOTE — ED Notes (Signed)
Date and time results received: 08/28/20 0216   Test: Blood Culture Critical Value: Aerobic bottle, gram positive cocci  Name of Provider Notified: Dr. Josephine Cables  Orders Received? Or Actions Taken?: no new orders given

## 2020-08-28 NOTE — Progress Notes (Signed)
Patient refused to have vitals taken, patient also took of telemetry box and refused to have it placed back on. Called Central Telemetry to let them know to place patient on stand by. Patient appears to be combative and confused.

## 2020-08-28 NOTE — Evaluation (Signed)
Clinical/Bedside Swallow Evaluation Patient Details  Name: Sonya Oliver MRN: [id number] Date of Birth: July 31, 1941  Today's Date: 08/28/2020 Time: SLP Start Time (ACUTE ONLY): 33 SLP Stop Time (ACUTE ONLY): 1533 SLP Time Calculation (min) (ACUTE ONLY): 23 min  Past Medical History:  Past Medical History:  Diagnosis Date  . Arthritis   . Bladder cancer ([place]) 2015  . Cataract   . CHF (congestive heart failure) ([place])    NO CARDIOLOGIST---  MONITORED BY PCP DR Jacelyn Grip  . COPD with emphysema ([place])    last Acute Exacerbation 08-21-2014  . H/O pulmonary edema    jan 2014--  acute CHF w/ pulmonary edema  . Hypertension   . Hyperthyroidism    currently no meds -- labs stable  . Nocturia   . Nocturnal oxygen desaturation    uses O2  HS via [place]--  and PRN daytime  . Osteoporosis   . Stroke ([place])   . Type 2 diabetes mellitus ([place])   . Ureteral tumor    Past Surgical History:  Past Surgical History:  Procedure Laterality Date  . ABDOMINAL HYSTERECTOMY  1980'S   W/ BILATERAL SALPINGO-OOPHORECTOMY  . CATARACT EXTRACTION W/PHACO Left 01/31/2014   Procedure: CATARACT EXTRACTION PHACO AND INTRAOCULAR LENS PLACEMENT (IOC);  Surgeon: Tonny Branch, MD;  Location: [facility];  Service: Ophthalmology;  Laterality: Left;  CDE: 23.78  . CATARACT EXTRACTION W/PHACO Right 02/28/2014   Procedure: CATARACT EXTRACTION PHACO AND INTRAOCULAR LENS PLACEMENT (IOC);  Surgeon: Tonny Branch, MD;  Location: [facility];  Service: Ophthalmology;  Laterality: Right;  CDE:  11.34  . CYSTOSCOPY  03/23/2012   Procedure: CYSTOSCOPY;  Surgeon: Claybon Jabs, MD;  Location: [facility];  Service: Urology;  Laterality: N/A;  . CYSTOSCOPY WITH RETROGRADE PYELOGRAM, URETEROSCOPY AND STENT PLACEMENT Right 10/21/2014   Procedure: CYSTOSCOPY WITH RETROGRADE PYELOGRAM, URETEROSCOPY, BLADDER TUMOR BIOPSY;  Surgeon: Claybon Jabs, MD;  Location: [facility];  Service: Urology;  Laterality: Right;  . TRANSTHORACIC ECHOCARDIOGRAM   06-21-2013   moderate LVH,  grade I diastolic dysfunction, ef 51-76%,  mild MR & TR  . TRANSURETHRAL RESECTION OF BLADDER TUMOR  03/23/2012   Procedure: TRANSURETHRAL RESECTION OF BLADDER TUMOR (TURBT);  Surgeon: Claybon Jabs, MD;  Location: [facility];  Service: Urology;  Laterality: N/A;  . TRANSURETHRAL RESECTION OF BLADDER TUMOR N/A 01/08/2013   Procedure: TRANSURETHRAL RESECTION OF BLADDER TUMOR (TURBT);  Surgeon: Claybon Jabs, MD;  Location: [facility];  Service: Urology;  Laterality: N/A;   HPI:  Sonya Oliver is a 79 y.o. female with medical history significant of osteoarthritis, bladder cancer, cataracts, diastolic CHF, COPD/emphysema, hypertension, history of hyperthyroidism, osteoporosis, history of CVA, type II DM, dementia who was brought to the emergency department via EMS after family called due to the patient having decreased oral intake and history of multiple falls.  She follows some simple commands, but does not answer questions. COVID+   Assessment / Plan / Recommendation Clinical Impression  Clinical swallow evaluation completed at bedside. Pt alert and cooperative. Pt with mild lingual coating. Pt assessed with ice chips, thin water via cup and straw, puree, and regular textures. Pt exhibited impaired mastication with resulting prolonged oral transit with min lingual residue, otherwise no overt signs or symptoms of aspiration or pharyngeal globus sensation. Recommend D3/mech soft and thin liquids with standard aspiration and reflux precautions, po medications whole with water or per Pt preference. Ensure oral cavity is clear after meals. No further SLP services indicated  at this time. Pt will need supervision and feeder assistance. Above to RN. SLP Visit Diagnosis: Dysphagia, unspecified (R13.10)    Aspiration Risk  Mild aspiration risk    Diet Recommendation Dysphagia 3 (Mech soft);Thin liquid   Liquid Administration via: Cup;Straw Medication  Administration: Whole meds with liquid Supervision: Staff to assist with self feeding;Full supervision/cueing for compensatory strategies Compensations: Follow solids with liquid Postural Changes: Seated upright at 90 degrees;Remain upright for at least 30 minutes after po intake    Other  Recommendations Oral Care Recommendations: Oral care BID;Staff/trained caregiver to provide oral care Other Recommendations: Clarify dietary restrictions   Follow up Recommendations None      Frequency and Duration            Prognosis Prognosis for Safe Diet Advancement: Good      Swallow Study   General Date of Onset: 08/26/20 HPI: Sonya Oliver is a 79 y.o. female with medical history significant of osteoarthritis, bladder cancer, cataracts, diastolic CHF, COPD/emphysema, hypertension, history of hyperthyroidism, osteoporosis, history of CVA, type II DM, dementia who was brought to the emergency department via EMS after family called due to the patient having decreased oral intake and history of multiple falls.  She follows some simple commands, but does not answer questions. COVID+ Type of Study: Bedside Swallow Evaluation Previous Swallow Assessment: N/A Diet Prior to this Study: NPO Temperature Spikes Noted: No Respiratory Status: Nasal cannula History of Recent Intubation: No Behavior/Cognition: Alert;Cooperative;Pleasant mood Oral Cavity Assessment: Within Functional Limits Oral Care Completed by SLP: Yes Oral Cavity - Dentition: Adequate natural dentition;Missing dentition Vision: Functional for self-feeding Self-Feeding Abilities: Needs assist Patient Positioning: Upright in bed Baseline Vocal Quality: Normal Volitional Cough: Cognitively unable to elicit Volitional Swallow: Unable to elicit    Oral/Motor/Sensory Function Overall Oral Motor/Sensory Function: Within functional limits   Ice Chips Ice chips: Within functional limits Presentation: Spoon   Thin Liquid Thin Liquid:  Within functional limits Presentation: Cup;Straw    Nectar Thick Nectar Thick Liquid: Not tested   Honey Thick Honey Thick Liquid: Not tested   Puree Puree: Within functional limits Presentation: Spoon   Solid     Solid: Impaired Oral Phase Impairments: Impaired mastication Oral Phase Functional Implications: Oral residue;Impaired mastication     Thank you,  Sonya Oliver, [place]  Sonya Oliver 08/28/2020,3:45 PM

## 2020-08-28 NOTE — Progress Notes (Addendum)
PROGRESS NOTE    Sonya Oliver  ZOX:[id number] DOB: [birth year] DOA: 08/26/2020 PCP: Sonya Brooklyn, FNP   Chief Complaint  Patient presents with  . Hypertension    Brief Narrative:  As per H&P written by Dr. Olevia Bowens on 08/26/2020 Sonya Oliver is a 79 y.o. female with medical history significant of osteoarthritis, bladder cancer, cataracts, diastolic CHF, COPD/emphysema, hypertension, history of hyperthyroidism, osteoporosis, history of CVA, type II DM, dementia who was brought to the emergency department via EMS after family called due to the patient having decreased oral intake and history of multiple falls.  She follows some simple commands, but does not answer questions.  ED Course: Initial vital signs were temperature 99.9 F, pulse 94, respirations 24, BP 173/94 mmHg O2 sat 92% on room air.  Her COVID-19 test was positive.  Labwork: Her urinalysis shows small hemoglobinuria, ketonuria of 80 and proteinuria 30 mg/dL.  All other urinalysis values were normal.  SARS 2 and influenza PCR was positive for COVID-19.  CBC showed a white count of 4.2, hemoglobin 12.5 g/dL and platelets 114.  Her D-dimer was 1.53 mcg/mL.  Fibrinogen was 536 mg/dL.  CMP shows sodium of 134 and chloride 97 mmol/L, total protein 8.7 g/dL and total bilirubin 1.4 mg/dL.  The rest of the CMP values are unremarkable.  Troponin was 17 and then 24 ng/L.  BNP was 156.0 pg/mL.  Lactic acid was normal.  Procalcitonin was less than 0.10 ng/mL.  Ferritin was 125 ng/mL.  CRP 3.4 mg/dL.  Imaging: CTA chest show a 3.1 cm rounded mass in the posterior left lower lobe.  This is suspicious for neoplasm.  There is small amount of patchy airspace opacity at the right lung base likely due to atelectasis or early infectious etiology.  CT pelvis did not show any acute fracture or dislocation involving the pelvis.  There was large volume of stool within the visualized bowel.  No evidence of obstruction.  Please see images and full  radiology report for further detail.  Assessment & Plan: 1-acute respiratory failure with hypoxia in the setting of 2019 novel coronavirus infection: -Continue to follow inflammatory markers -Continue remdesivir and steroids; day 3 out of 5 of remdesivir -Continue as needed bronchodilators and supportive care -Wean off oxygen supplementation as tolerated. -Oxygen supplementation down to 2-3 L -Patient reports feeling better.  2-COPD -Currently no wheezing -Continue treatment with steroids and bronchodilators as mentioned above  3-hypertension -Blood pressure stable -Assess tolerance of advance diet today and resume oral antihypertensive agents on 08/29/2020. -Continue as needed hydralazine currently.  4-GERD/GI prophylaxis -Will continue the use of famotidine parenterally 24 hours -Will transition to PPI once oral intake proven to be safely tolerated.  5-dysphagia -Appears to be oropharyngeal in nature -Appreciate speech therapy evaluation -Dysphagia 3 diet with thin liquids recommended  6-type 2 diabetes mellitus -Continue to follow CBGs and use a sliding scale insulin as needed -Will follow A1c.   follow 7-chronic diastolic heart failure -Appears to be compensated at this time -Continue to follow daily weights and strict I's and O's.  8-left lung lower lobe mass -Characteristic of imaging findings suggestive of neoplasm. -This will require outpatient further work-up and examination once COVID-19 condition has been treated and stabilized.   DVT prophylaxis: Lovenox Code Status: DNR Family Communication: No family at bedside.  Patient's daughter updated over the phone [name]) 08/28/2020.  Disposition:   Status is: Inpatient  Dispo: The patient is from: Home  Anticipated d/c is to: To be determined              Anticipated d/c date is: To be determined              Patient currently not medically stable for discharge; patient still requiring  oxygen supplementation and short of breath with activity.  Patient has been seen by speech therapy with recommendation for dysphagia 3 thin liquids.  Will continue treatment with remdesivir, steroids and as needed bronchodilators.  Continue to wean off oxygen supplementation.  Will check physical capacity with PT evaluation.     Consultants:   None  Procedures:  See below for x-ray reports.   Antimicrobials/antiviral:  Remdesivir day 3 out of 5.   Subjective: More alert, following commands appropriately and demonstrated improvement in her breathing.  Still requiring 3-4 L nasal cannula supplementation.  Objective: Vitals:   08/28/20 0230 08/28/20 0430 08/28/20 0900 08/28/20 1300  BP:  128/66 136/60 135/66  Pulse: 83 75 84 84  Resp: 20 15 20 18   Temp:   98.2 F (36.8 C) 97.7 F (36.5 C)  TempSrc:   Oral Oral  SpO2: 100% 100% 100% 100%  Weight:   76.2 kg     Intake/Output Summary (Last 24 hours) at 08/28/2020 1633 Last data filed at 08/27/2020 2251 Gross per 24 hour  Intake --  Output 1000 ml  Net -1000 ml   Filed Weights   08/28/20 0900  Weight: 76.2 kg    Examination: General exam: Alert, awake, following commands appropriately today; patient is afebrile, no chest pain, no nausea or vomiting reported.  Still requiring oxygen supplementation (4 L nasal cannula currently), still short of breath with activity. Respiratory system: Fair air movement bilaterally; positive rhonchi.  No using accessory muscle. Cardiovascular system:RRR. No murmurs, rubs, gallops.  No JVD. Gastrointestinal system: Abdomen is nondistended, soft and nontender. No organomegaly or masses felt. Normal bowel sounds heard. Central nervous system: No focal neurological deficits. Extremities: No cyanosis or clubbing. Skin: No rashes, no petechiae. Psychiatry: Mood & affect appropriate.    Data Reviewed: I have personally reviewed following labs and imaging studies  CBC: Recent Labs  Lab  08/26/20 2045 08/27/20 0439 08/28/20 0601  WBC 4.2 4.0 2.4*  NEUTROABS 3.2 2.8 1.7  HGB 12.5 11.8* 13.3  HCT 41.3 39.5 44.9  MCV 86.2 86.6 87.5  PLT 114* 104* 110*    Basic Metabolic Panel: Recent Labs  Lab 08/26/20 2045 08/27/20 0439 08/28/20 0601  NA 134* 134* 138  K 3.7 3.8 4.2  CL 97* 98 96*  CO2 26 27 31   GLUCOSE 71 80 121*  BUN 12 10 13   CREATININE 0.71 0.69 0.66  CALCIUM 9.0 8.4* 8.9  MG 1.8  --   --   PHOS 2.7  --   --     GFR: Estimated Creatinine Clearance: 57.6 mL/min (by C-G formula based on SCr of 0.66 mg/dL).  Liver Function Tests: Recent Labs  Lab 08/26/20 2045 08/27/20 0439 08/28/20 0601  AST 19 17 26   ALT 10 10 15   ALKPHOS 58 50 49  BILITOT 1.4* 1.0 0.8  PROT 8.7* 7.7 8.1  ALBUMIN 3.6 3.3* 3.3*    CBG: No results for input(s): GLUCAP in the last 168 hours.   Recent Results (from the past 240 hour(s))  Resp Panel by RT-PCR (Flu A&B, Covid) Nasopharyngeal Swab     Status: Abnormal   Collection Time: 08/26/20  8:44 PM   Specimen: Nasopharyngeal Swab; Nasopharyngeal(NP)  swabs in vial transport medium  Result Value Ref Range Status   SARS Coronavirus 2 by RT PCR POSITIVE (A) NEGATIVE Final    Comment: RESULT CALLED TO, READ BACK BY AND VERIFIED WITH: BRAME,M @ 2156 ON 08/26/20 BY JUW (NOTE) SARS-CoV-2 target nucleic acids are DETECTED.  The SARS-CoV-2 RNA is generally detectable in upper respiratory specimens during the acute phase of infection. Positive results are indicative of the presence of the identified virus, but do not rule out bacterial infection or co-infection with other pathogens not detected by the test. Clinical correlation with patient history and other diagnostic information is necessary to determine patient infection status. The expected result is Negative.  Fact Sheet for Patients: EntrepreneurPulse.com.au  Fact Sheet for Healthcare Providers: IncredibleEmployment.be  This  test is not yet approved or cleared by the [place] FDA and  has been authorized for detection and/or diagnosis of SARS-CoV-2 by FDA under an Emergency Use Authorization (EUA).  This EUA will remain in effect (meaning this test can be  used) for the duration of  the COVID-19 declaration under Section 564(b)(1) of the Act, 21 U.S.C. section 360bbb-3(b)(1), unless the authorization is terminated or revoked sooner.     Influenza A by PCR NEGATIVE NEGATIVE Final   Influenza B by PCR NEGATIVE NEGATIVE Final    Comment: (NOTE) The Xpert Xpress SARS-CoV-2/FLU/RSV plus assay is intended as an aid in the diagnosis of influenza from Nasopharyngeal swab specimens and should not be used as a sole basis for treatment. Nasal washings and aspirates are unacceptable for Xpert Xpress SARS-CoV-2/FLU/RSV testing.  Fact Sheet for Patients: EntrepreneurPulse.com.au  Fact Sheet for Healthcare Providers: IncredibleEmployment.be  This test is not yet approved or cleared by the [place] FDA and has been authorized for detection and/or diagnosis of SARS-CoV-2 by FDA under an Emergency Use Authorization (EUA). This EUA will remain in effect (meaning this test can be used) for the duration of the COVID-19 declaration under Section 564(b)(1) of the Act, 21 U.S.C. section 360bbb-3(b)(1), unless the authorization is terminated or revoked.  Performed at [facility], [address], [place], [place] [address]   Blood Culture (routine x 2)     Status: None (Preliminary result)   Collection Time: 08/26/20  8:45 PM   Specimen: BLOOD LEFT ARM  Result Value Ref Range Status   Specimen Description   Final    BLOOD LEFT ARM Performed at [facility], [address], [place], [place] [address]    Special Requests   Final    BOTTLES DRAWN AEROBIC AND ANAEROBIC Blood Culture adequate volume Performed at [facility], [address], [place], [place] [address]    Culture   Setup Time   Final    AEROBIC BOTTLE ONLY GRAM POSITIVE COCCI Gram Stain Report Called to,Read Back By and Verified With: M BRAME,RN@0210  08/28/20 MKELLY CRITICAL RESULT CALLED TO, READ BACK BY AND VERIFIED WITH: Vira Browns RN 08/28/20 1829 JDW Performed at [facility], [address], [place], [place] [address]    Culture   Final    NO GROWTH 2 DAYS Performed at [facility], [address] [place],  [address]    Report Status PENDING  Incomplete  Blood Culture ID Panel (Reflexed)     Status: Abnormal   Collection Time: 08/26/20  8:45 PM  Result Value Ref Range Status   Enterococcus faecalis NOT DETECTED NOT DETECTED Final   Enterococcus Faecium NOT DETECTED NOT DETECTED Final   Listeria monocytogenes NOT DETECTED NOT  DETECTED Final   Staphylococcus species DETECTED (A) NOT DETECTED Final    Comment: CRITICAL RESULT CALLED TO, READ BACK BY AND VERIFIED WITH: Vira Browns RN 08/28/20 0426 JDW    Staphylococcus aureus (BCID) NOT DETECTED NOT DETECTED Final   Staphylococcus epidermidis NOT DETECTED NOT DETECTED Final   Staphylococcus lugdunensis NOT DETECTED NOT DETECTED Final   Streptococcus species NOT DETECTED NOT DETECTED Final   Streptococcus agalactiae NOT DETECTED NOT DETECTED Final   Streptococcus pneumoniae NOT DETECTED NOT DETECTED Final   Streptococcus pyogenes NOT DETECTED NOT DETECTED Final   A.calcoaceticus-baumannii NOT DETECTED NOT DETECTED Final   Bacteroides fragilis NOT DETECTED NOT DETECTED Final   Enterobacterales NOT DETECTED NOT DETECTED Final   Enterobacter cloacae complex NOT DETECTED NOT DETECTED Final   Escherichia coli NOT DETECTED NOT DETECTED Final   Klebsiella aerogenes NOT DETECTED NOT DETECTED Final   Klebsiella oxytoca NOT DETECTED NOT DETECTED Final   Klebsiella pneumoniae NOT DETECTED NOT DETECTED Final   Proteus species NOT DETECTED NOT DETECTED Final   Salmonella species NOT DETECTED NOT DETECTED Final   Serratia marcescens NOT DETECTED  NOT DETECTED Final   Haemophilus influenzae NOT DETECTED NOT DETECTED Final   Neisseria meningitidis NOT DETECTED NOT DETECTED Final   Pseudomonas aeruginosa NOT DETECTED NOT DETECTED Final   Stenotrophomonas maltophilia NOT DETECTED NOT DETECTED Final   Candida albicans NOT DETECTED NOT DETECTED Final   Candida auris NOT DETECTED NOT DETECTED Final   Candida glabrata NOT DETECTED NOT DETECTED Final   Candida krusei NOT DETECTED NOT DETECTED Final   Candida parapsilosis NOT DETECTED NOT DETECTED Final   Candida tropicalis NOT DETECTED NOT DETECTED Final   Cryptococcus neoformans/gattii NOT DETECTED NOT DETECTED Final    Comment: Performed at [facility], [address], [place], [place] [address]  Blood Culture (routine x 2)     Status: None (Preliminary result)   Collection Time: 08/26/20  9:09 PM   Specimen: BLOOD RIGHT ARM  Result Value Ref Range Status   Specimen Description BLOOD RIGHT ARM  Final   Special Requests   Final    BOTTLES DRAWN AEROBIC AND ANAEROBIC Blood Culture adequate volume   Culture   Final    NO GROWTH 2 DAYS Performed at [facility], [address] [place], [place] [address]    Report Status PENDING  Incomplete     Radiology Studies: CT Angio Chest PE W/Cm &/Or Wo Cm  Result Date: 08/27/2020 CLINICAL DATA:  Shortness of breath EXAM: CT ANGIOGRAPHY CHEST WITH CONTRAST TECHNIQUE: Multidetector CT imaging of the chest was performed using the standard protocol during bolus administration of intravenous contrast. Multiplanar CT image reconstructions and MIPs were obtained to evaluate the vascular anatomy. CONTRAST:  148mL OMNIPAQUE IOHEXOL 350 MG/ML SOLN COMPARISON:  None. FINDINGS: Cardiovascular: There is a optimal opacification of the pulmonary arteries. There is no central,segmental, or subsegmental filling defects within the pulmonary arteries. The heart is normal in size. No pericardial effusion or thickening. No evidence right heart strain. There is  normal three-vessel brachiocephalic anatomy without proximal stenosis. The thoracic aorta is normal in appearance. Mediastinum/Nodes: No hilar, mediastinal, or axillary adenopathy. Thyroid gland, trachea, and esophagus demonstrate no significant findings. Lungs/Pleura: A rounded 3.1 cm mass is seen within the posterior left lower lobe. Centrilobular emphysematous changes are seen. A small amount of patchy airspace opacity seen at the right lung base. Upper Abdomen: No acute abnormalities present in the visualized portions of the upper abdomen. Musculoskeletal: No chest wall  abnormality. No acute or significant osseous findings. Review of the MIP images confirms the above findings. IMPRESSION: No central, segmental, or subsegmental pulmonary embolism. 3.1 cm rounded mass in the posterior left lower lobe. This could represent a primary pulmonary neoplasm. Small amount of patchy airspace opacity at the right lung base, likely due to atelectasis and/or early infectious etiology. Emphysema (ICD10-J43.9). Electronically Signed   By: Prudencio Pair M.D.   On: 08/27/2020 03:03   CT PELVIS WO CONTRAST  Result Date: 08/27/2020 CLINICAL DATA:  Multiple falls, right hip pain EXAM: CT PELVIS WITHOUT CONTRAST TECHNIQUE: Multidetector CT imaging of the pelvis was performed following the standard protocol without intravenous contrast. COMPARISON:  None. FINDINGS: Urinary Tract:  No abnormality visualized. Bowel: There is large volume stool seen within the visualized large bowel. No evidence of obstruction or focal inflammation. No free fluid within the pelvis. Vascular/Lymphatic: Moderate aortoiliac atherosclerotic calcification. No aortic aneurysm. No pathologic adenopathy within the abdomen and pelvis. Reproductive:  Uterus absent.  No adnexal masses. Other:  Rectum unremarkable.  No abdominal wall hernia identified. Musculoskeletal: The osseous structures are intact. No evidence of fracture or dislocation. Degenerative  changes are seen within the lumbar spine. There is degenerative enthesopathy involving the insertion of the hamstrings bilaterally. IMPRESSION: No acute fracture or dislocation involving the pelvis. Large volume stool within the a visualized bowel within the pelvis. No evidence of obstruction. Aortic Atherosclerosis (ICD10-I70.0). Electronically Signed   By: Fidela Salisbury MD   On: 08/27/2020 02:56   DG Chest Port 1 View  Result Date: 08/26/2020 CLINICAL DATA:  Shortness of breath EXAM: PORTABLE CHEST 1 VIEW COMPARISON:  12/22/2016 FINDINGS: Hyperinflation with emphysematous disease. No consolidation or effusion. Mild cardiomegaly with aortic atherosclerosis. No pneumothorax. IMPRESSION: No active disease. Hyperinflation with emphysematous disease. Mild cardiomegaly. Electronically Signed   By: Donavan Foil M.D.   On: 08/26/2020 21:29   DG Knee Left Port  Result Date: 08/27/2020 CLINICAL DATA:  Multiple falls EXAM: PORTABLE LEFT KNEE - 1-2 VIEW COMPARISON:  08/15/2020 FINDINGS: Negative for fracture.  Normal alignment. Moderate degenerative change in the medial joint compartment with joint space narrowing and spurring. Mild degenerative change laterally and in the patella. Small joint effusion. Atherosclerotic calcification. IMPRESSION: Negative for fracture. Electronically Signed   By: Franchot Gallo M.D.   On: 08/27/2020 08:08    Scheduled Meds: . vitamin C  500 mg Oral Daily  . enoxaparin (LOVENOX) injection  40 mg Subcutaneous Q24H  . methylPREDNISolone (SOLU-MEDROL) injection  40 mg Intravenous Q12H  . zinc sulfate  220 mg Oral Daily   Continuous Infusions: . famotidine (PEPCID) IV 20 mg (08/28/20 1330)  . remdesivir 100 mg in NS 100 mL 100 mg (08/28/20 1006)     LOS: 1 day    Time spent: 30 minutes   Barton Dubois, MD [facility]   To contact the attending provider between 7A-7P or the covering provider during after hours 7P-7A, please log into the web site  [email or web address] and access using universal [place] password for that web site. If you do not have the password, please call the hospital operator.  08/28/2020, 4:33 PM

## 2020-08-29 DIAGNOSIS — J9601 Acute respiratory failure with hypoxia: Secondary | ICD-10-CM | POA: Diagnosis not present

## 2020-08-29 DIAGNOSIS — E119 Type 2 diabetes mellitus without complications: Secondary | ICD-10-CM | POA: Diagnosis not present

## 2020-08-29 DIAGNOSIS — R5381 Other malaise: Secondary | ICD-10-CM

## 2020-08-29 DIAGNOSIS — U071 COVID-19: Secondary | ICD-10-CM | POA: Diagnosis not present

## 2020-08-29 DIAGNOSIS — I5032 Chronic diastolic (congestive) heart failure: Secondary | ICD-10-CM | POA: Diagnosis not present

## 2020-08-29 LAB — CBC WITH DIFFERENTIAL/PLATELET
Abs Immature Granulocytes: 0.01 10*3/uL (ref 0.00–0.07)
Basophils Absolute: 0 10*3/uL (ref 0.0–0.1)
Basophils Relative: 0 %
Eosinophils Absolute: 0 10*3/uL (ref 0.0–0.5)
Eosinophils Relative: 0 %
HCT: 42.6 % (ref 36.0–46.0)
Hemoglobin: 13.1 g/dL (ref 12.0–15.0)
Immature Granulocytes: 0 %
Lymphocytes Relative: 15 %
Lymphs Abs: 0.7 10*3/uL (ref 0.7–4.0)
MCH: 26.6 pg (ref 26.0–34.0)
MCHC: 30.8 g/dL (ref 30.0–36.0)
MCV: 86.6 fL (ref 80.0–100.0)
Monocytes Absolute: 0.7 10*3/uL (ref 0.1–1.0)
Monocytes Relative: 14 %
Neutro Abs: 3.4 10*3/uL (ref 1.7–7.7)
Neutrophils Relative %: 71 %
Platelets: 153 10*3/uL (ref 150–400)
RBC: 4.92 MIL/uL (ref 3.87–5.11)
RDW: 14.1 % (ref 11.5–15.5)
WBC: 4.8 10*3/uL (ref 4.0–10.5)
nRBC: 0 % (ref 0.0–0.2)

## 2020-08-29 LAB — FERRITIN: Ferritin: 160 ng/mL (ref 11–307)

## 2020-08-29 LAB — HEMOGLOBIN A1C
Hgb A1c MFr Bld: 5.8 % — ABNORMAL HIGH (ref 4.8–5.6)
Mean Plasma Glucose: 119.76 mg/dL

## 2020-08-29 LAB — COMPREHENSIVE METABOLIC PANEL
ALT: 17 U/L (ref 0–44)
AST: 35 U/L (ref 15–41)
Albumin: 3.2 g/dL — ABNORMAL LOW (ref 3.5–5.0)
Alkaline Phosphatase: 45 U/L (ref 38–126)
Anion gap: 9 (ref 5–15)
BUN: 25 mg/dL — ABNORMAL HIGH (ref 8–23)
CO2: 31 mmol/L (ref 22–32)
Calcium: 8.7 mg/dL — ABNORMAL LOW (ref 8.9–10.3)
Chloride: 98 mmol/L (ref 98–111)
Creatinine, Ser: 0.85 mg/dL (ref 0.44–1.00)
GFR, Estimated: >60 mL/min (ref >60)
Glucose, Bld: 111 mg/dL — ABNORMAL HIGH (ref 70–99)
Potassium: 4.2 mmol/L (ref 3.5–5.1)
Sodium: 138 mmol/L (ref 135–145)
Total Bilirubin: 0.9 mg/dL (ref 0.3–1.2)
Total Protein: 7.7 g/dL (ref 6.5–8.1)

## 2020-08-29 LAB — CULTURE, BLOOD (ROUTINE X 2): Special Requests: ADEQUATE

## 2020-08-29 LAB — D-DIMER, QUANTITATIVE: D-Dimer, Quant: 2.78 ug/mL-FEU — ABNORMAL HIGH (ref 0.00–0.50)

## 2020-08-29 LAB — C-REACTIVE PROTEIN: CRP: 2.8 mg/dL — ABNORMAL HIGH (ref <1.0)

## 2020-08-29 MED ORDER — CARVEDILOL 3.125 MG PO TABS
3.1250 mg | ORAL_TABLET | Freq: Two times a day (BID) | ORAL | Status: DC
Start: 2020-08-29 — End: 2020-09-01
  Administered 2020-08-29 – 2020-09-01 (×4): 3.125 mg via ORAL
  Filled 2020-08-29 (×6): qty 1

## 2020-08-29 MED ORDER — CYCLOSPORINE 0.05 % OP EMUL
1.0000 [drp] | Freq: Two times a day (BID) | OPHTHALMIC | Status: DC
  Administered 2020-08-29 – 2020-09-01 (×4): 1 [drp] via OPHTHALMIC
  Filled 2020-08-29 (×5): qty 1

## 2020-08-29 MED ORDER — PANTOPRAZOLE SODIUM 40 MG PO TBEC
40.0000 mg | DELAYED_RELEASE_TABLET | Freq: Every day | ORAL | Status: DC
  Administered 2020-08-29 – 2020-09-01 (×4): 40 mg via ORAL
  Filled 2020-08-29 (×4): qty 1

## 2020-08-29 MED ORDER — POTASSIUM CHLORIDE CRYS ER 20 MEQ PO TBCR
20.0000 meq | EXTENDED_RELEASE_TABLET | Freq: Every day | ORAL | Status: DC
  Administered 2020-08-30: 20 meq via ORAL
  Filled 2020-08-29 (×2): qty 1

## 2020-08-29 MED ORDER — FLUTICASONE FUROATE-VILANTEROL 100-25 MCG/INH IN AEPB
1.0000 | INHALATION_SPRAY | Freq: Every day | RESPIRATORY_TRACT | Status: DC
  Filled 2020-08-29: qty 28

## 2020-08-29 MED ORDER — FUROSEMIDE 40 MG PO TABS
40.0000 mg | ORAL_TABLET | Freq: Every day | ORAL | Status: DC
  Administered 2020-08-29 – 2020-09-01 (×4): 40 mg via ORAL
  Filled 2020-08-29 (×4): qty 1

## 2020-08-29 MED ORDER — ATORVASTATIN CALCIUM 10 MG PO TABS
10.0000 mg | ORAL_TABLET | Freq: Every day | ORAL | Status: DC
  Administered 2020-08-29 – 2020-09-01 (×4): 10 mg via ORAL
  Filled 2020-08-29 (×4): qty 1

## 2020-08-29 NOTE — Progress Notes (Signed)
Pt has been sleeping off and on all day. Easily awakens, denies c/o. Oriented to person only. Pt refused lunch tray and fluids. Pt took 1/2 cup chocolate ice cream with meds at 1245.  Pt update given to Ileana Roup via phone. Questions answered.

## 2020-08-29 NOTE — NC FL2 (Signed)
[place] LEVEL OF CARE SCREENING TOOL     IDENTIFICATION  Patient Name: Sonya Oliver Birthdate: 1940/09/28 Sex: female Admission Date (Current Location): 08/26/2020  [facility] and [place] Number:  [employer] and Address:  [place] [address] [place]      Provider Number: [id number]  Attending Physician Name and Address:  Barton Dubois, MD  Relative Name and Phone Number:  [name]  [phone]    Daughter    Current Level of Care: Hospital Recommended Level of Care: [place] Prior Approval Number:    Date Approved/Denied:   PASRR Number: [id number]  Discharge Plan: SNF    Current Diagnoses: Patient Active Problem List   Diagnosis Date Noted  . Mass of lower lobe of left lung 08/27/2020  . Acute respiratory failure with hypoxia ([place]) 08/27/2020  . 2019 novel coronavirus disease (COVID-19) 08/26/2020  . 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 Chronic diastolic CHF (congestive heart failure) ([place]) 09/29/2016  . History of smoking 25-50 pack years 07/20/2016  . Aortic atherosclerosis ([place]) 07/20/2016  . Osteoporosis 07/08/2016  . Diabetes mellitus, type 2 ([place]) 12/14/2015  . COPD (chronic obstructive pulmonary disease) ([place]) 12/19/2012  . Vitamin D deficiency 12/19/2012  . HTN (hypertension) 12/19/2012  . Edema leg 09/12/2012  . History of anemia 03/21/2012  . Subclinical hyperthyroidism 02/23/2012    Orientation RESPIRATION BLADDER Height & Weight     Self  Normal External catheter Weight: 76.2 kg Height:     BEHAVIORAL SYMPTOMS/MOOD NEUROLOGICAL BOWEL NUTRITION STATUS      Continent Diet (see DC summary)  AMBULATORY STATUS COMMUNICATION OF NEEDS Skin   Extensive Assist Verbally Normal                       Personal Care Assistance Level of Assistance  Bathing,Feeding,Dressing Bathing Assistance: Maximum assistance Feeding assistance: Independent Dressing Assistance: Limited  assistance     Functional Limitations Info  Sight,Speech,Hearing Sight Info: Adequate Hearing Info: Adequate Speech Info: Adequate    SPECIAL CARE FACTORS FREQUENCY  PT (By licensed PT)     PT Frequency: 5 times a week              Contractures Contractures Info: Not present    Additional Factors Info  Code Status,Allergies Code Status Info: DNR Allergies Info: NKDA           Current Medications (08/29/2020):  This is the current hospital active medication list Current Facility-Administered Medications  Medication Dose Route Frequency Provider Last Rate Last Admin  . acetaminophen (TYLENOL) tablet 650 mg  650 mg Oral Q6H PRN Reubin Milan, MD       Or  . acetaminophen (TYLENOL) suppository 650 mg  650 mg Rectal Q6H PRN Reubin Milan, MD      . albuterol (VENTOLIN HFA) 108 (90 Base) MCG/ACT inhaler 2 puff  2 puff Inhalation Q4H PRN Barton Dubois, MD      . ascorbic acid (VITAMIN C) tablet 500 mg  500 mg Oral Daily Reubin Milan, MD   500 mg at 08/29/20 1023  . atorvastatin (LIPITOR) tablet 10 mg  10 mg Oral Daily Barton Dubois, MD   10 mg at 08/29/20 1244  . carvedilol (COREG) tablet 3.125 mg  3.125 mg Oral BID Barton Dubois, MD   3.125 mg at 08/29/20 1247  . chlorpheniramine-HYDROcodone (TUSSIONEX) 10-8 MG/5ML suspension 5 mL  5 mL Oral Q12H PRN Reubin Milan, MD      .  cycloSPORINE (RESTASIS) 0.05 % ophthalmic emulsion 1 drop  1 drop Both Eyes BID Barton Dubois, MD      . enoxaparin (LOVENOX) injection 40 mg  40 mg Subcutaneous Q24H Reubin Milan, MD   40 mg at 08/28/20 5789  . fluticasone furoate-vilanterol (BREO ELLIPTA) 100-25 MCG/INH 1 puff  1 puff Inhalation Daily Barton Dubois, MD      . furosemide (LASIX) tablet 40 mg  40 mg Oral Daily Barton Dubois, MD   40 mg at 08/29/20 1244  . guaiFENesin-dextromethorphan (ROBITUSSIN DM) 100-10 MG/5ML syrup 10 mL  10 mL Oral Q4H PRN Reubin Milan, MD      . hydrALAZINE (APRESOLINE)  injection 10 mg  10 mg Intravenous Q8H PRN Barton Dubois, MD      . methylPREDNISolone sodium succinate (SOLU-MEDROL) 40 mg/mL injection 40 mg  40 mg Intravenous Q12H Barton Dubois, MD   40 mg at 08/29/20 7847  . pantoprazole (PROTONIX) EC tablet 40 mg  40 mg Oral Daily Barton Dubois, MD   40 mg at 08/29/20 1244  . potassium chloride SA (KLOR-CON) CR tablet 20 mEq  20 mEq Oral Daily Barton Dubois, MD      . prochlorperazine (COMPAZINE) injection 5 mg  5 mg Intravenous Q4H PRN Reubin Milan, MD      . remdesivir 100 mg in sodium chloride 0.9 % 100 mL IVPB  100 mg Intravenous Daily Reubin Milan, MD 200 mL/hr at 08/29/20 1001 100 mg at 08/29/20 1001  . zinc sulfate capsule 220 mg  220 mg Oral Daily Reubin Milan, MD   220 mg at 08/29/20 1023     Discharge Medications: Please see discharge summary for a list of discharge medications.  Relevant Imaging Results:  Relevant Lab Results:   Additional Information SS# [id number]  Boneta Lucks, RN

## 2020-08-29 NOTE — Plan of Care (Signed)
  Problem: Acute Rehab PT Goals(only PT should resolve) Goal: Pt Will Go Supine/Side To Sit Outcome: Progressing Flowsheets (Taken 08/29/2020 0938) Pt will go Supine/Side to Sit:  with supervision  with min guard assist Goal: Patient Will Transfer Sit To/From Stand Outcome: Progressing Flowsheets (Taken 08/29/2020 [phone]) Patient will transfer sit to/from stand:  with minimal assist  with moderate assist Goal: Pt Will Transfer Bed To Chair/Chair To Bed Outcome: Progressing Flowsheets (Taken 08/29/2020 0938) Pt will Transfer Bed to Chair/Chair to Bed:  with min assist  with mod assist Goal: Pt Will Ambulate Outcome: Progressing Flowsheets (Taken 08/29/2020 0938) Pt will Ambulate:  15 feet  with minimal assist  with moderate assist  with rolling walker   9:39 AM, 08/29/20 Lonell Grandchild, MPT Physical Therapist with [facility] 336 [phone] office [phone] mobile phone

## 2020-08-29 NOTE — Evaluation (Signed)
Physical Therapy Evaluation Patient Details Name: Sonya Oliver MRN: [id number] DOB: 18-Oct-1940 Today's Date: 08/29/2020   History of Present Illness  Sonya Oliver is a 79 y.o. female with medical history significant of osteoarthritis, bladder cancer, cataracts, diastolic CHF, COPD/emphysema, hypertension, history of hyperthyroidism, osteoporosis, history of CVA, type II DM, dementia who was brought to the emergency department via EMS after family called due to the patient having decreased oral intake and history of multiple falls.  She follows some simple commands, but does not answer questions.    Clinical Impression  Patient presents with her nasal canula off and would not allow it put back on with SpO2 at 85% on room air.  Patient demonstrates slow labored movement for sitting up at bedside, limited mostly due to fatigue, weakness and severe confusion requiring Max verbal/tactile cueing to participate.  Patient unable to attempt sit to stands or transfers due to agitation/confusion - RN notified.  Patient will benefit from continued physical therapy in hospital and recommended venue below to increase strength, balance, endurance for safe ADLs and gait.     Follow Up Recommendations SNF    Equipment Recommendations  None recommended by PT    Recommendations for Other Services       Precautions / Restrictions Precautions Precautions: Fall Restrictions Weight Bearing Restrictions: No      Mobility  Bed Mobility Overal bed mobility: Needs Assistance Bed Mobility: Supine to Sit;Sit to Supine     Supine to sit: Mod assist Sit to supine: Mod assist   General bed mobility comments: slow labored movement, requires constant verbal/tactile cueing to participate    Transfers                    Ambulation/Gait                Stairs            Wheelchair Mobility    Modified Rankin (Stroke Patients Only)       Balance Overall balance  assessment: Needs assistance Sitting-balance support: Feet supported;No upper extremity supported Sitting balance-Leahy Scale: Fair Sitting balance - Comments: seated at EOB                                     Pertinent Vitals/Pain Pain Assessment: Faces Faces Pain Scale: Hurts even more Pain Location: pressure to BLE Pain Descriptors / Indicators: Sore;Guarding;Grimacing Pain Intervention(s): Limited activity within patient's tolerance;Monitored during session;Repositioned    Home Living Family/patient expects to be discharged to:: Private residence Living Arrangements: Children Available Help at Discharge: Family;Available 24 hours/day Type of Home: House Home Access: Stairs to enter   [employer] of Steps: 3 Home Layout: One level Home Equipment: Bedside commode;Walker - 4 wheels;Walker - 2 wheels Additional Comments: per chart patient wears O2 at night, patient is poor historian all infor per prior admission    Prior Function                 Hand Dominance        Extremity/Trunk Assessment   Upper Extremity Assessment Upper Extremity Assessment: Overall WFL for tasks assessed    Lower Extremity Assessment Lower Extremity Assessment: Generalized weakness    Cervical / Trunk Assessment Cervical / Trunk Assessment: Normal  Communication   Communication: Other (comment) (Patient appears confused and unable to answer questions)  Cognition Arousal/Alertness: Awake/alert Behavior During Therapy: Agitated;Impulsive Overall Cognitive Status:  No family/caregiver present to determine baseline cognitive functioning                                        General Comments      Exercises     Assessment/Plan    PT Assessment Patient needs continued PT services  PT Problem List Decreased strength;Decreased activity tolerance;Decreased balance;Decreased mobility       PT Treatment Interventions Balance training;DME  instruction;Gait training;Stair training;Functional mobility training;Therapeutic activities;Therapeutic exercise;Patient/family education    PT Goals (Current goals can be found in the Care Plan section)  Acute Rehab PT Goals Patient Stated Goal: not stated PT Goal Formulation: With patient Time For Goal Achievement: 09/12/20 Potential to Achieve Goals: Fair    Frequency Min 3X/week   Barriers to discharge        Co-evaluation               AM-PAC PT "6 Clicks" Mobility  Outcome Measure Help needed turning from your back to your side while in a flat bed without using bedrails?: A Lot Help needed moving from lying on your back to sitting on the side of a flat bed without using bedrails?: A Lot Help needed moving to and from a bed to a chair (including a wheelchair)?: Total Help needed standing up from a chair using your arms (e.g., wheelchair or bedside chair)?: Total Help needed to walk in hospital room?: Total Help needed climbing 3-5 steps with a railing? : Total 6 Click Score: 8    End of Session Equipment Utilized During Treatment: Oxygen Activity Tolerance: Patient tolerated treatment well;Patient limited by fatigue Patient left: in bed;with call bell/phone within reach;with bed alarm set Nurse Communication: Mobility status PT Visit Diagnosis: Unsteadiness on feet (R26.81);Other abnormalities of gait and mobility (R26.89);Muscle weakness (generalized) (M62.81)    Time: 1610-9604 PT Time Calculation (min) (ACUTE ONLY): 26 min   Charges:   PT Evaluation $PT Eval Moderate Complexity: 1 Mod PT Treatments $Therapeutic Activity: 23-37 mins        9:36 AM, 08/29/20 Lonell Grandchild, MPT Physical Therapist with [facility] [phone] office [phone] mobile phone

## 2020-08-29 NOTE — TOC Initial Note (Addendum)
Transition of Care [facility]) - Initial/Assessment Note    Patient Details  Name: Sonya Oliver MRN: [id number] Date of Birth: [birth year]  [facility] CM/SW Contact:    Boneta Lucks, RN Phone Number: 08/29/2020, 3:50 PM  Clinical Narrative:      Patient admitted with COVID. PT is recommending SNF. TOC spoke wth [name] - daughter. Family works day shift and has no one to assist patient during the day. She is agreeing to SNF. We discussed it would have to be a SNF accepting COVID patients. Referrals sent out, patient is not medically ready. TOC will start INS Auth with start date 08/31/19.            Addendum : [facility] is closed today. Attempted to do online, Would not accept patient for 2022. TOC to follow.  Expected Discharge Plan: Skilled Nursing Facility Barriers to Discharge: Continued Medical Work up   Patient Goals and CMS Choice Patient states their goals for this hospitalization and ongoing recovery are:: to go to SNF CMS Medicare.gov Compare Post Acute Care list provided to:: Patient Represenative (must comment) Choice offered to / list presented to : Adult Children  Expected Discharge Plan and Services Expected Discharge Plan: [place]      Prior Living Arrangements/Services   Lives with:: Self          Need for Family Participation in Patient Care: Yes (Comment) Care giver support system in place?: Yes (comment)   Criminal Activity/Legal Involvement Pertinent to Current Situation/Hospitalization: No - Comment as needed  Activities of Daily Living   ADL Screening (condition at time of admission) Patient's cognitive ability adequate to safely complete daily activities?: No Does the patient have difficulty concentrating, remembering, or making decisions?: Yes  Permission Sought/Granted      Share Information with NAME: [name]     Permission granted to share info w Relationship: Daughter     Emotional Assessment        Orientation: : Oriented to Self Alcohol / Substance Use: Not Applicable Psych Involvement: No (comment)  Admission diagnosis:  Fall [W19.XXXA] Right hip pain [M25.551] FTT (failure to thrive) in adult [R62.7] Acute respiratory failure with hypoxia (HCC) [J96.01] 2019 novel coronavirus disease (COVID-19) [U07.1] COVID-19 virus infection [U07.1] Patient Active Problem List   Diagnosis Date Noted  . Mass of lower lobe of left lung 08/27/2020  . Acute respiratory failure with hypoxia ([place]) 08/27/2020  . 2019 novel coronavirus disease (COVID-19) 08/26/2020  . 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 Chronic diastolic CHF (congestive heart failure) ([place]) 09/29/2016  . History of smoking 25-50 pack years 07/20/2016  . Aortic atherosclerosis ([place]) 07/20/2016  . Osteoporosis 07/08/2016  . Diabetes mellitus, type 2 ([place]) 12/14/2015  . COPD (chronic obstructive pulmonary disease) ([place]) 12/19/2012  . Vitamin D deficiency 12/19/2012  . HTN (hypertension) 12/19/2012  . Edema leg 09/12/2012  . History of anemia 03/21/2012  . Subclinical hyperthyroidism 02/23/2012   PCP:  Loman Brooklyn, FNP Pharmacy:   [id number] [place], [place] [address] Phone: [phone] Fax: [phone]      Readmission Risk Interventions Readmission Risk Prevention Plan 08/29/2020  Transportation Screening Complete  Home Care Screening Complete  Medication Review (RN CM) Complete  Some recent data might be hidden

## 2020-08-29 NOTE — Progress Notes (Signed)
Pt awake and alert upon initial assessment. Pt oriented to person only. Pleasant. Explained procedures I needed to do and asked pt if it was OK to do them. Pt smiled and said, "Yea, if you hurry up!". IV restarted in left proximal forearm and labs drawn from site. Pt tolerated well. Pt then cleaned of incontinent BM and urine, bathed and bed linens changed. Purewick replaced and to wall suction. Pt given toothbrush and brushed her teeth. Upper dentures cleaned and given to patient who placed them in her mouth. Pt agreeable to breakfast. Tray set up, pt able to feed self approx 25% of meal, seemed to keep forgetting what she was doing but was easily redirected. Pt with no SOB or resp difficulty. SaO2 96-98% on RA, Chest diminished in bases but otherwise clear. Resp even and nonlabored. Pt would not allow telemetry unit to be put back on her.  MD Dyann Kief in to evaluate pt during this time, updated on pt condition and VS.

## 2020-08-29 NOTE — Progress Notes (Signed)
PROGRESS NOTE    Sonya Oliver  QAS:[id number] DOB: [birth year] DOA: 08/26/2020 PCP: Loman Brooklyn, FNP   Chief Complaint  Patient presents with  . Hypertension    Brief Narrative:  As per H&P written by Dr. Olevia Bowens on 08/26/2020 Sonya Oliver is a 79 y.o. female with medical history significant of osteoarthritis, bladder cancer, cataracts, diastolic CHF, COPD/emphysema, hypertension, history of hyperthyroidism, osteoporosis, history of CVA, type II DM, dementia who was brought to the emergency department via EMS after family called due to the patient having decreased oral intake and history of multiple falls.  She follows some simple commands, but does not answer questions.  ED Course: Initial vital signs were temperature 99.9 F, pulse 94, respirations 24, BP 173/94 mmHg O2 sat 92% on room air.  Her COVID-19 test was positive.  Labwork: Her urinalysis shows small hemoglobinuria, ketonuria of 80 and proteinuria 30 mg/dL.  All other urinalysis values were normal.  SARS 2 and influenza PCR was positive for COVID-19.  CBC showed a white count of 4.2, hemoglobin 12.5 g/dL and platelets 114.  Her D-dimer was 1.53 mcg/mL.  Fibrinogen was 536 mg/dL.  CMP shows sodium of 134 and chloride 97 mmol/L, total protein 8.7 g/dL and total bilirubin 1.4 mg/dL.  The rest of the CMP values are unremarkable.  Troponin was 17 and then 24 ng/L.  BNP was 156.0 pg/mL.  Lactic acid was normal.  Procalcitonin was less than 0.10 ng/mL.  Ferritin was 125 ng/mL.  CRP 3.4 mg/dL.  Imaging: CTA chest show a 3.1 cm rounded mass in the posterior left lower lobe.  This is suspicious for neoplasm.  There is small amount of patchy airspace opacity at the right lung base likely due to atelectasis or early infectious etiology.  CT pelvis did not show any acute fracture or dislocation involving the pelvis.  There was large volume of stool within the visualized bowel.  No evidence of obstruction.  Please see images and full  radiology report for further detail.  Assessment & Plan: 1-acute respiratory failure with hypoxia in the setting of 2019 novel coronavirus infection: -Continue to follow inflammatory markers -Continue remdesivir and steroids; day 4 out of 5 of remdesivir -Continue as needed bronchodilators and supportive care -Wean off oxygen supplementation as tolerated. -Oxygen supplementation not needed currently; patient with good O2 sat on RA> -Patient reports feeling much better.  2-COPD -Currently no wheezing -Continue treatment with steroids and bronchodilators as mentioned above  3-hypertension -Blood pressure stable -resume home antihypertensive meds today.  4-GERD/GI prophylaxis -will start protonix daily  5-dysphagia -Appears to be oropharyngeal in nature -Appreciate speech therapy evaluation -continue Dysphagia 3 diet with thin liquids recommended  6-type 2 diabetes mellitus -Continue to follow CBGs and use a sliding scale insulin as needed -Will follow repeat A1c. -Last check close to 120 days was 6.2.  7-chronic diastolic heart failure -Appears to be compensated at this time -Continue to follow daily weights and strict I's and O's.  8-left lung lower lobe mass -Characteristic of imaging findings suggestive of neoplasm. -This will require outpatient further work-up and examination once COVID-19 condition has been treated and stabilized.  9-physical deconditioning -appreciate PT evaluation -TOC to assist with SNF placement for rehab/conditioning.   DVT prophylaxis: Lovenox Code Status: DNR Family Communication: No family at bedside.  Patient's daughter updated over the phone [name]) 08/28/2020.  Disposition:   Status is: Inpatient  Dispo: The patient is from: Home  Anticipated d/c is to: Skilled nursing facility.              Anticipated d/c date is: Anticipating medical stability for discharge in the next 24-48 hours.              Patient  currently not medically stable for discharge; patient still requiring oxygen supplementation and short of breath with activity.  Patient has been seen by speech therapy with recommendation for dysphagia 3 thin liquids.  Will continue treatment with remdesivir, steroids and as needed bronchodilators.  Continue to wean off oxygen supplementation.  Will check physical capacity with PT evaluation.     Consultants:   None  Procedures:  See below for x-ray reports.   Antimicrobials/antiviral:  Remdesivir day 4 out of 5.   Subjective: Oriented x1, alert, awake and following commands.  No requiring oxygen supplementation.  Patient denies chest pain, nausea and vomiting.  Significantly weak and deconditioned and when evaluated by physical therapy recommendations for skilled nursing facility were received.  Objective: Vitals:   08/28/20 0900 08/28/20 1300 08/28/20 1719 08/29/20 1030  BP: 136/60 135/66 97/61 (!) 144/70  Pulse: 84 84 85 90  Resp: 20 18 18 18   Temp: 98.2 F (36.8 C) 97.7 F (36.5 C) 98.9 F (37.2 C)   TempSrc: Oral Oral Oral   SpO2: 100% 100% 100% 96%  Weight: 76.2 kg       Intake/Output Summary (Last 24 hours) at 08/29/2020 1121 Last data filed at 08/29/2020 1048 Gross per 24 hour  Intake 391.96 ml  Output --  Net 391.96 ml   Filed Weights   08/28/20 0900  Weight: 76.2 kg    Examination: General exam: Alert, awake, oriented x 1; mild appropriately and expressing no chest pain, no nausea or vomiting.  Good oxygen saturation on room air and feeling better. Respiratory system: No wheezing, normal respiratory effort.  Positive scattered rhonchi at with diminished breath sounds at the bases.  No using accessory muscles. Cardiovascular system:RRR.  No rubs, no gallops, no murmurs.  No JVD on examination. Gastrointestinal system: Abdomen is nondistended, soft and nontender. No organomegaly or masses felt. Normal bowel sounds heard. Central nervous system: No focal  neurological deficits. Extremities: No cyanosis or clubbing; trace edema bilaterally. Skin: No petechiae. Psychiatry: Mood & affect appropriate.    Data Reviewed: I have personally reviewed following labs and imaging studies  CBC: Recent Labs  Lab 08/26/20 2045 08/27/20 0439 08/28/20 0601 08/29/20 0954  WBC 4.2 4.0 2.4* 4.8  NEUTROABS 3.2 2.8 1.7 3.4  HGB 12.5 11.8* 13.3 13.1  HCT 41.3 39.5 44.9 42.6  MCV 86.2 86.6 87.5 86.6  PLT 114* 104* 110* 026    Basic Metabolic Panel: Recent Labs  Lab 08/26/20 2045 08/27/20 0439 08/28/20 0601 08/29/20 0954  NA 134* 134* 138 138  K 3.7 3.8 4.2 4.2  CL 97* 98 96* 98  CO2 26 27 31 31   GLUCOSE 71 80 121* 111*  BUN 12 10 13  25*  CREATININE 0.71 0.69 0.66 0.85  CALCIUM 9.0 8.4* 8.9 8.7*  MG 1.8  --   --   --   PHOS 2.7  --   --   --     GFR: Estimated Creatinine Clearance: 54.2 mL/min (by C-G formula based on SCr of 0.85 mg/dL).  Liver Function Tests: Recent Labs  Lab 08/26/20 2045 08/27/20 0439 08/28/20 0601 08/29/20 0954  AST 19 17 26  35  ALT 10 10 15 17   ALKPHOS 58 50 49  45  BILITOT 1.4* 1.0 0.8 0.9  PROT 8.7* 7.7 8.1 7.7  ALBUMIN 3.6 3.3* 3.3* 3.2*    CBG: No results for input(s): GLUCAP in the last 168 hours.   Recent Results (from the past 240 hour(s))  Resp Panel by RT-PCR (Flu A&B, Covid) Nasopharyngeal Swab     Status: Abnormal   Collection Time: 08/26/20  8:44 PM   Specimen: Nasopharyngeal Swab; Nasopharyngeal(NP) swabs in vial transport medium  Result Value Ref Range Status   SARS Coronavirus 2 by RT PCR POSITIVE (A) NEGATIVE Final    Comment: RESULT CALLED TO, READ BACK BY AND VERIFIED WITH: BRAME,M @ 2156 ON 08/26/20 BY JUW (NOTE) SARS-CoV-2 target nucleic acids are DETECTED.  The SARS-CoV-2 RNA is generally detectable in upper respiratory specimens during the acute phase of infection. Positive results are indicative of the presence of the identified virus, but do not rule out bacterial  infection or co-infection with other pathogens not detected by the test. Clinical correlation with patient history and other diagnostic information is necessary to determine patient infection status. The expected result is Negative.  Fact Sheet for Patients: EntrepreneurPulse.com.au  Fact Sheet for Healthcare Providers: IncredibleEmployment.be  This test is not yet approved or cleared by the [place] FDA and  has been authorized for detection and/or diagnosis of SARS-CoV-2 by FDA under an Emergency Use Authorization (EUA).  This EUA will remain in effect (meaning this test can be  used) for the duration of  the COVID-19 declaration under Section 564(b)(1) of the Act, 21 U.S.C. section 360bbb-3(b)(1), unless the authorization is terminated or revoked sooner.     Influenza A by PCR NEGATIVE NEGATIVE Final   Influenza B by PCR NEGATIVE NEGATIVE Final    Comment: (NOTE) The Xpert Xpress SARS-CoV-2/FLU/RSV plus assay is intended as an aid in the diagnosis of influenza from Nasopharyngeal swab specimens and should not be used as a sole basis for treatment. Nasal washings and aspirates are unacceptable for Xpert Xpress SARS-CoV-2/FLU/RSV testing.  Fact Sheet for Patients: EntrepreneurPulse.com.au  Fact Sheet for Healthcare Providers: IncredibleEmployment.be  This test is not yet approved or cleared by the [place] FDA and has been authorized for detection and/or diagnosis of SARS-CoV-2 by FDA under an Emergency Use Authorization (EUA). This EUA will remain in effect (meaning this test can be used) for the duration of the COVID-19 declaration under Section 564(b)(1) of the Act, 21 U.S.C. section 360bbb-3(b)(1), unless the authorization is terminated or revoked.  Performed at [facility], [address] [place], [place] [address]   Blood Culture (routine x 2)     Status: Abnormal   Collection Time:  08/26/20  8:45 PM   Specimen: BLOOD LEFT ARM  Result Value Ref Range Status   Specimen Description   Final    BLOOD LEFT ARM Performed at [facility], [address] [place], [place] [address]    Special Requests   Final    BOTTLES DRAWN AEROBIC AND ANAEROBIC Blood Culture adequate volume Performed at [facility], [address] [place],  [address]    Culture  Setup Time   Final    AEROBIC BOTTLE ONLY GRAM POSITIVE COCCI Gram Stain Report Called to,Read Back By and Verified With: M BRAME,RN@0210  08/28/20 MKELLY CRITICAL RESULT CALLED TO, READ BACK BY AND VERIFIED WITH: Vira Browns RN 08/28/20 0426 JDW    Culture (A)  Final    STAPHYLOCOCCUS HOMINIS THE SIGNIFICANCE OF ISOLATING THIS ORGANISM FROM A SINGLE SET OF BLOOD CULTURES WHEN MULTIPLE  SETS ARE DRAWN IS UNCERTAIN. PLEASE NOTIFY THE MICROBIOLOGY DEPARTMENT WITHIN ONE WEEK IF SPECIATION AND SENSITIVITIES ARE REQUIRED. Performed at [facility], [place] [address], [place], [place] [address]    Report Status 08/29/2020 FINAL  Final  Blood Culture ID Panel (Reflexed)     Status: Abnormal   Collection Time: 08/26/20  8:45 PM  Result Value Ref Range Status   Enterococcus faecalis NOT DETECTED NOT DETECTED Final   Enterococcus Faecium NOT DETECTED NOT DETECTED Final   Listeria monocytogenes NOT DETECTED NOT DETECTED Final   Staphylococcus species DETECTED (A) NOT DETECTED Final    Comment: CRITICAL RESULT CALLED TO, READ BACK BY AND VERIFIED WITH: Vira Browns RN 08/28/20 0426 JDW    Staphylococcus aureus (BCID) NOT DETECTED NOT DETECTED Final   Staphylococcus epidermidis NOT DETECTED NOT DETECTED Final   Staphylococcus lugdunensis NOT DETECTED NOT DETECTED Final   Streptococcus species NOT DETECTED NOT DETECTED Final   Streptococcus agalactiae NOT DETECTED NOT DETECTED Final   Streptococcus pneumoniae NOT DETECTED NOT DETECTED Final   Streptococcus pyogenes NOT DETECTED NOT DETECTED Final   A.calcoaceticus-baumannii NOT  DETECTED NOT DETECTED Final   Bacteroides fragilis NOT DETECTED NOT DETECTED Final   Enterobacterales NOT DETECTED NOT DETECTED Final   Enterobacter cloacae complex NOT DETECTED NOT DETECTED Final   Escherichia coli NOT DETECTED NOT DETECTED Final   Klebsiella aerogenes NOT DETECTED NOT DETECTED Final   Klebsiella oxytoca NOT DETECTED NOT DETECTED Final   Klebsiella pneumoniae NOT DETECTED NOT DETECTED Final   Proteus species NOT DETECTED NOT DETECTED Final   Salmonella species NOT DETECTED NOT DETECTED Final   Serratia marcescens NOT DETECTED NOT DETECTED Final   Haemophilus influenzae NOT DETECTED NOT DETECTED Final   Neisseria meningitidis NOT DETECTED NOT DETECTED Final   Pseudomonas aeruginosa NOT DETECTED NOT DETECTED Final   Stenotrophomonas maltophilia NOT DETECTED NOT DETECTED Final   Candida albicans NOT DETECTED NOT DETECTED Final   Candida auris NOT DETECTED NOT DETECTED Final   Candida glabrata NOT DETECTED NOT DETECTED Final   Candida krusei NOT DETECTED NOT DETECTED Final   Candida parapsilosis NOT DETECTED NOT DETECTED Final   Candida tropicalis NOT DETECTED NOT DETECTED Final   Cryptococcus neoformans/gattii NOT DETECTED NOT DETECTED Final    Comment: Performed at [facility], [address] [place], [place] [address]  Blood Culture (routine x 2)     Status: None (Preliminary result)   Collection Time: 08/26/20  9:09 PM   Specimen: BLOOD RIGHT ARM  Result Value Ref Range Status   Specimen Description BLOOD RIGHT ARM  Final   Special Requests   Final    BOTTLES DRAWN AEROBIC AND ANAEROBIC Blood Culture adequate volume   Culture   Final    NO GROWTH 3 DAYS Performed at [facility], [address], [place], [place] [address]    Report Status PENDING  Incomplete     Radiology Studies: No results found.  Scheduled Meds: . vitamin C  500 mg Oral Daily  . enoxaparin (LOVENOX) injection  40 mg Subcutaneous Q24H  . methylPREDNISolone (SOLU-MEDROL) injection   40 mg Intravenous Q12H  . zinc sulfate  220 mg Oral Daily   Continuous Infusions: . famotidine (PEPCID) IV Stopped (08/28/20 1357)  . remdesivir 100 mg in NS 100 mL 100 mg (08/29/20 1001)     LOS: 2 days    Time spent: 30 minutes   Barton Dubois, MD [facility]   To contact the attending provider between  7A-7P or the covering provider during after hours 7P-7A, please log into the web site [email or web address] and access using universal [place] password for that web site. If you do not have the password, please call the hospital operator.  08/29/2020, 11:21 AM

## 2020-08-30 DIAGNOSIS — U071 COVID-19: Secondary | ICD-10-CM | POA: Diagnosis not present

## 2020-08-30 DIAGNOSIS — E119 Type 2 diabetes mellitus without complications: Secondary | ICD-10-CM | POA: Diagnosis not present

## 2020-08-30 DIAGNOSIS — J9601 Acute respiratory failure with hypoxia: Secondary | ICD-10-CM | POA: Diagnosis not present

## 2020-08-30 DIAGNOSIS — I5032 Chronic diastolic (congestive) heart failure: Secondary | ICD-10-CM | POA: Diagnosis not present

## 2020-08-30 LAB — COMPREHENSIVE METABOLIC PANEL
ALT: 17 U/L (ref 0–44)
AST: 30 U/L (ref 15–41)
Albumin: 3.3 g/dL — ABNORMAL LOW (ref 3.5–5.0)
Alkaline Phosphatase: 47 U/L (ref 38–126)
Anion gap: 10 (ref 5–15)
BUN: 26 mg/dL — ABNORMAL HIGH (ref 8–23)
CO2: 33 mmol/L — ABNORMAL HIGH (ref 22–32)
Calcium: 8.6 mg/dL — ABNORMAL LOW (ref 8.9–10.3)
Chloride: 95 mmol/L — ABNORMAL LOW (ref 98–111)
Creatinine, Ser: 0.82 mg/dL (ref 0.44–1.00)
GFR, Estimated: >60 mL/min (ref >60)
Glucose, Bld: 168 mg/dL — ABNORMAL HIGH (ref 70–99)
Potassium: 3.7 mmol/L (ref 3.5–5.1)
Sodium: 138 mmol/L (ref 135–145)
Total Bilirubin: 0.7 mg/dL (ref 0.3–1.2)
Total Protein: 8.1 g/dL (ref 6.5–8.1)

## 2020-08-30 LAB — CBC WITH DIFFERENTIAL/PLATELET
Abs Immature Granulocytes: 0.01 10*3/uL (ref 0.00–0.07)
Basophils Absolute: 0 10*3/uL (ref 0.0–0.1)
Basophils Relative: 0 %
Eosinophils Absolute: 0 10*3/uL (ref 0.0–0.5)
Eosinophils Relative: 0 %
HCT: 46.6 % — ABNORMAL HIGH (ref 36.0–46.0)
Hemoglobin: 13.9 g/dL (ref 12.0–15.0)
Immature Granulocytes: 0 %
Lymphocytes Relative: 13 %
Lymphs Abs: 0.5 10*3/uL — ABNORMAL LOW (ref 0.7–4.0)
MCH: 25.6 pg — ABNORMAL LOW (ref 26.0–34.0)
MCHC: 29.8 g/dL — ABNORMAL LOW (ref 30.0–36.0)
MCV: 85.7 fL (ref 80.0–100.0)
Monocytes Absolute: 0.5 10*3/uL (ref 0.1–1.0)
Monocytes Relative: 11 %
Neutro Abs: 3.1 10*3/uL (ref 1.7–7.7)
Neutrophils Relative %: 76 %
Platelets: 174 10*3/uL (ref 150–400)
RBC: 5.44 MIL/uL — ABNORMAL HIGH (ref 3.87–5.11)
RDW: 14.6 % (ref 11.5–15.5)
WBC: 4.1 10*3/uL (ref 4.0–10.5)
nRBC: 0 % (ref 0.0–0.2)

## 2020-08-30 LAB — FERRITIN: Ferritin: 177 ng/mL (ref 11–307)

## 2020-08-30 LAB — C-REACTIVE PROTEIN: CRP: 4.9 mg/dL — ABNORMAL HIGH (ref <1.0)

## 2020-08-30 LAB — D-DIMER, QUANTITATIVE: D-Dimer, Quant: 2.93 ug/mL-FEU — ABNORMAL HIGH (ref 0.00–0.50)

## 2020-08-30 MED ORDER — ALBUTEROL SULFATE (2.5 MG/3ML) 0.083% IN NEBU
INHALATION_SOLUTION | RESPIRATORY_TRACT | Status: AC
  Filled 2020-08-30: qty 3

## 2020-08-30 MED ORDER — IPRATROPIUM BROMIDE 0.02 % IN SOLN
RESPIRATORY_TRACT | Status: AC
  Filled 2020-08-30: qty 2.5

## 2020-08-30 NOTE — Progress Notes (Addendum)
PROGRESS NOTE    Sonya Oliver  LDJ:[id number] DOB: Feb 18, 1941 DOA: 08/26/2020 PCP: Loman Brooklyn, FNP   Chief Complaint  Patient presents with  . Hypertension    Brief Narrative:  As per H&P written by Dr. Olevia Bowens on 08/26/2020 Sonya Oliver is a 80 y.o. female with medical history significant of osteoarthritis, bladder cancer, cataracts, diastolic CHF, COPD/emphysema, hypertension, history of hyperthyroidism, osteoporosis, history of CVA, type II DM, dementia who was brought to the emergency department via EMS after family called due to the patient having decreased oral intake and history of multiple falls.  She follows some simple commands, but does not answer questions.  ED Course: Initial vital signs were temperature 99.9 F, pulse 94, respirations 24, BP 173/94 mmHg O2 sat 92% on room air.  Her COVID-19 test was positive.  Labwork: Her urinalysis shows small hemoglobinuria, ketonuria of 80 and proteinuria 30 mg/dL.  All other urinalysis values were normal.  SARS 2 and influenza PCR was positive for COVID-19.  CBC showed a white count of 4.2, hemoglobin 12.5 g/dL and platelets 114.  Her D-dimer was 1.53 mcg/mL.  Fibrinogen was 536 mg/dL.  CMP shows sodium of 134 and chloride 97 mmol/L, total protein 8.7 g/dL and total bilirubin 1.4 mg/dL.  The rest of the CMP values are unremarkable.  Troponin was 17 and then 24 ng/L.  BNP was 156.0 pg/mL.  Lactic acid was normal.  Procalcitonin was less than 0.10 ng/mL.  Ferritin was 125 ng/mL.  CRP 3.4 mg/dL.  Imaging: CTA chest show a 3.1 cm rounded mass in the posterior left lower lobe.  This is suspicious for neoplasm.  There is small amount of patchy airspace opacity at the right lung base likely due to atelectasis or early infectious etiology.  CT pelvis did not show any acute fracture or dislocation involving the pelvis.  There was large volume of stool within the visualized bowel.  No evidence of obstruction.  Please see images and full  radiology report for further detail.  Assessment & Plan: 1-acute respiratory failure with hypoxia in the setting of 2019 novel coronavirus infection: -Continue to follow inflammatory markers -Continue remdesivir and steroids; day 5 out of 5 of remdesivir -Continue as needed bronchodilators and supportive care -continue Wean off oxygen supplementation as tolerated. -in no acute distress. -transition steroids to oral route tomorrow.   2-COPD -Currently no wheezing -Continue treatment with steroids and bronchodilators as mentioned above  3-hypertension -Blood pressure stable -continue current antihypertensive agents.  4-GERD/GI prophylaxis -Continue Protonix.  5-dysphagia -Appears to be oropharyngeal in nature -Appreciate speech therapy evaluation -continue Dysphagia 3 diet with thin liquids recommended  6-type 2 diabetes mellitus -Continue to follow CBGs and use a sliding scale insulin as needed -repeat A1c 5.8.  7-chronic diastolic heart failure -Appears to be compensated at this time -Continue to follow daily weights and strict I's and O's. -Heart healthy diet encouraged.  8-left lung lower lobe mass -Characteristic of imaging findings suggestive of neoplasm. -This will require outpatient further work-up and examination once COVID-19 condition has been treated and stabilized.  9-physical deconditioning -appreciate PT evaluation -TOC to assist with SNF placement for rehab/conditioning.   DVT prophylaxis: Lovenox Code Status: DNR Family Communication: No family at bedside.  Patient's daughter updated over the phone [name]) 08/28/2020.  Disposition:   Status is: Inpatient  Dispo: The patient is from: Home              Anticipated d/c is to: Skilled nursing facility.  Anticipated d/c date is: Anticipating medical stability for discharge in the next 24-48 hours.              Patient currently medically stable for discharge to complete remdesivir  infusion today. Will continue weaning off oxygen supplementation and continue steroids. She is weak and deconditioned and will require skilled nursing facility for rehabilitation based on PT evaluation.    Consultants:   None  Procedures:  See below for x-ray reports.   Antimicrobials/antiviral:  Remdesivir day 5 out of 5.   Subjective: Oriented x1, no fever, no chest pain, no nausea, no vomiting. 2 L nasal cannula supplementation has been started since yesterday night. Patient in no acute distress.  Objective: Vitals:   08/29/20 2141 08/29/20 2145 08/30/20 0913 08/30/20 0944  BP: (!) 141/63   124/75  Pulse: 79   75  Resp:    16  Temp: 99.5 F (37.5 C)   98.1 F (36.7 C)  TempSrc:    Axillary  SpO2: (!) 73% 92% 97% 99%  Weight:        Intake/Output Summary (Last 24 hours) at 08/30/2020 1456 Last data filed at 08/30/2020 1034 Gross per 24 hour  Intake 150 ml  Output 750 ml  Net -600 ml   Filed Weights   08/28/20 0900  Weight: 76.2 kg    Examination: General exam: Alert, awake, oriented x 1; place on 2 L nasal cannula supplementation overnight; reports no chest pain, no nausea, no vomiting. Nursing staff reported patient refusing care and been intermittently combative. Following commands appropriately during my visit and in no acute distress. Respiratory system: No wheezing, normal respiratory effort. Positive rhonchi bilaterally appreciated. No crackles. Cardiovascular system:RRR. No murmurs, rubs, gallops. No JVD. Gastrointestinal system: Abdomen is nondistended, soft and nontender. No organomegaly or masses felt. Normal bowel sounds heard. Central nervous system:No focal neurological deficits. Extremities: No trace edema bilaterally. Skin: No petechiae. Psychiatry: Mood & affect appropriate at this time.    Data Reviewed: I have personally reviewed following labs and imaging studies  CBC: Recent Labs  Lab 08/26/20 2045 08/27/20 0439 08/28/20 0601  08/29/20 0954 08/30/20 0500  WBC 4.2 4.0 2.4* 4.8 4.1  NEUTROABS 3.2 2.8 1.7 3.4 3.1  HGB 12.5 11.8* 13.3 13.1 13.9  HCT 41.3 39.5 44.9 42.6 46.6*  MCV 86.2 86.6 87.5 86.6 85.7  PLT 114* 104* 110* 153 401    Basic Metabolic Panel: Recent Labs  Lab 08/26/20 2045 08/27/20 0439 08/28/20 0601 08/29/20 0954 08/30/20 0500  NA 134* 134* 138 138 138  K 3.7 3.8 4.2 4.2 3.7  CL 97* 98 96* 98 95*  CO2 26 27 31 31  33*  GLUCOSE 71 80 121* 111* 168*  BUN 12 10 13  25* 26*  CREATININE 0.71 0.69 0.66 0.85 0.82  CALCIUM 9.0 8.4* 8.9 8.7* 8.6*  MG 1.8  --   --   --   --   PHOS 2.7  --   --   --   --     GFR: Estimated Creatinine Clearance: 56.1 mL/min (by C-G formula based on SCr of 0.82 mg/dL).  Liver Function Tests: Recent Labs  Lab 08/26/20 2045 08/27/20 0439 08/28/20 0601 08/29/20 0954 08/30/20 0500  AST 19 17 26  35 30  ALT 10 10 15 17 17   ALKPHOS 58 50 49 45 47  BILITOT 1.4* 1.0 0.8 0.9 0.7  PROT 8.7* 7.7 8.1 7.7 8.1  ALBUMIN 3.6 3.3* 3.3* 3.2* 3.3*    CBG: No results for  input(s): GLUCAP in the last 168 hours.   Recent Results (from the past 240 hour(s))  Resp Panel by RT-PCR (Flu A&B, Covid) Nasopharyngeal Swab     Status: Abnormal   Collection Time: 08/26/20  8:44 PM   Specimen: Nasopharyngeal Swab; Nasopharyngeal(NP) swabs in vial transport medium  Result Value Ref Range Status   SARS Coronavirus 2 by RT PCR POSITIVE (A) NEGATIVE Final    Comment: RESULT CALLED TO, READ BACK BY AND VERIFIED WITH: BRAME,M @ 2156 ON 08/26/20 BY JUW (NOTE) SARS-CoV-2 target nucleic acids are DETECTED.  The SARS-CoV-2 RNA is generally detectable in upper respiratory specimens during the acute phase of infection. Positive results are indicative of the presence of the identified virus, but do not rule out bacterial infection or co-infection with other pathogens not detected by the test. Clinical correlation with patient history and other diagnostic information is necessary to  determine patient infection status. The expected result is Negative.  Fact Sheet for Patients: EntrepreneurPulse.com.au  Fact Sheet for Healthcare Providers: IncredibleEmployment.be  This test is not yet approved or cleared by the [place] FDA and  has been authorized for detection and/or diagnosis of SARS-CoV-2 by FDA under an Emergency Use Authorization (EUA).  This EUA will remain in effect (meaning this test can be  used) for the duration of  the COVID-19 declaration under Section 564(b)(1) of the Act, 21 U.S.C. section 360bbb-3(b)(1), unless the authorization is terminated or revoked sooner.     Influenza A by PCR NEGATIVE NEGATIVE Final   Influenza B by PCR NEGATIVE NEGATIVE Final    Comment: (NOTE) The Xpert Xpress SARS-CoV-2/FLU/RSV plus assay is intended as an aid in the diagnosis of influenza from Nasopharyngeal swab specimens and should not be used as a sole basis for treatment. Nasal washings and aspirates are unacceptable for Xpert Xpress SARS-CoV-2/FLU/RSV testing.  Fact Sheet for Patients: EntrepreneurPulse.com.au  Fact Sheet for Healthcare Providers: IncredibleEmployment.be  This test is not yet approved or cleared by the [place] FDA and has been authorized for detection and/or diagnosis of SARS-CoV-2 by FDA under an Emergency Use Authorization (EUA). This EUA will remain in effect (meaning this test can be used) for the duration of the COVID-19 declaration under Section 564(b)(1) of the Act, 21 U.S.C. section 360bbb-3(b)(1), unless the authorization is terminated or revoked.  Performed at [facility], [address], [place], [place] [address]   Blood Culture (routine x 2)     Status: Abnormal   Collection Time: 08/26/20  8:45 PM   Specimen: BLOOD LEFT ARM  Result Value Ref Range Status   Specimen Description   Final    BLOOD LEFT ARM Performed at [facility], [address], [place], [place] [address]    Special Requests   Final    BOTTLES DRAWN AEROBIC AND ANAEROBIC Blood Culture adequate volume Performed at [facility], [address], [place], [place] [address]    Culture  Setup Time   Final    AEROBIC BOTTLE ONLY GRAM POSITIVE COCCI Gram Stain Report Called to,Read Back By and Verified With: M BRAME,RN@0210  08/28/20 MKELLY CRITICAL RESULT CALLED TO, READ BACK BY AND VERIFIED WITH: Vira Browns RN 08/28/20 0426 JDW    Culture (A)  Final    STAPHYLOCOCCUS HOMINIS THE SIGNIFICANCE OF ISOLATING THIS ORGANISM FROM A SINGLE SET OF BLOOD CULTURES WHEN MULTIPLE SETS ARE DRAWN IS UNCERTAIN. PLEASE NOTIFY THE MICROBIOLOGY DEPARTMENT WITHIN ONE WEEK IF SPECIATION AND SENSITIVITIES ARE REQUIRED. Performed at [facility], [address] [place], [place] [address]    Report Status 08/29/2020 FINAL  Final  Blood Culture ID Panel (Reflexed)     Status: Abnormal   Collection Time: 08/26/20  8:45 PM  Result Value Ref Range Status   Enterococcus faecalis NOT DETECTED NOT DETECTED Final   Enterococcus Faecium NOT DETECTED NOT DETECTED Final   Listeria monocytogenes NOT DETECTED NOT DETECTED Final   Staphylococcus species DETECTED (A) NOT DETECTED Final    Comment: CRITICAL RESULT CALLED TO, READ BACK BY AND VERIFIED WITH: Vira Browns RN 08/28/20 0426 JDW    Staphylococcus aureus (BCID) NOT DETECTED NOT DETECTED Final   Staphylococcus epidermidis NOT DETECTED NOT DETECTED Final   Staphylococcus lugdunensis NOT DETECTED NOT DETECTED Final   Streptococcus species NOT DETECTED NOT DETECTED Final   Streptococcus agalactiae NOT DETECTED NOT DETECTED Final   Streptococcus pneumoniae NOT DETECTED NOT DETECTED Final   Streptococcus pyogenes NOT DETECTED NOT DETECTED Final   A.calcoaceticus-baumannii NOT DETECTED NOT DETECTED Final   Bacteroides fragilis NOT DETECTED NOT DETECTED Final   Enterobacterales NOT DETECTED NOT DETECTED Final   Enterobacter cloacae complex NOT  DETECTED NOT DETECTED Final   Escherichia coli NOT DETECTED NOT DETECTED Final   Klebsiella aerogenes NOT DETECTED NOT DETECTED Final   Klebsiella oxytoca NOT DETECTED NOT DETECTED Final   Klebsiella pneumoniae NOT DETECTED NOT DETECTED Final   Proteus species NOT DETECTED NOT DETECTED Final   Salmonella species NOT DETECTED NOT DETECTED Final   Serratia marcescens NOT DETECTED NOT DETECTED Final   Haemophilus influenzae NOT DETECTED NOT DETECTED Final   Neisseria meningitidis NOT DETECTED NOT DETECTED Final   Pseudomonas aeruginosa NOT DETECTED NOT DETECTED Final   Stenotrophomonas maltophilia NOT DETECTED NOT DETECTED Final   Candida albicans NOT DETECTED NOT DETECTED Final   Candida auris NOT DETECTED NOT DETECTED Final   Candida glabrata NOT DETECTED NOT DETECTED Final   Candida krusei NOT DETECTED NOT DETECTED Final   Candida parapsilosis NOT DETECTED NOT DETECTED Final   Candida tropicalis NOT DETECTED NOT DETECTED Final   Cryptococcus neoformans/gattii NOT DETECTED NOT DETECTED Final    Comment: Performed at [facility], [address], [place], [place] [address]  Blood Culture (routine x 2)     Status: None (Preliminary result)   Collection Time: 08/26/20  9:09 PM   Specimen: BLOOD RIGHT ARM  Result Value Ref Range Status   Specimen Description BLOOD RIGHT ARM  Final   Special Requests   Final    BOTTLES DRAWN AEROBIC AND ANAEROBIC Blood Culture adequate volume   Culture   Final    NO GROWTH 4 DAYS Performed at [facility], [address], [place], [place] [address]    Report Status PENDING  Incomplete     Radiology Studies: No results found.  Scheduled Meds: . vitamin C  500 mg Oral Daily  . atorvastatin  10 mg Oral Daily  . carvedilol  3.125 mg Oral BID  . cycloSPORINE  1 drop Both Eyes BID  . enoxaparin (LOVENOX) injection  40 mg Subcutaneous Q24H  . fluticasone furoate-vilanterol  1 puff Inhalation Daily  . furosemide  40 mg Oral Daily  .  methylPREDNISolone (SOLU-MEDROL) injection  40 mg Intravenous Q12H  . pantoprazole  40 mg Oral Daily  . zinc sulfate  220 mg Oral Daily   Continuous Infusions:    LOS: 3 days    Time spent: 30 minutes   Barton Dubois, MD [facility]   To contact the  attending provider between 7A-7P or the covering provider during after hours 7P-7A, please log into the web site [email or web address] and access using universal [place] password for that web site. If you do not have the password, please call the hospital operator.  08/30/2020, 2:56 PM

## 2020-08-30 NOTE — Plan of Care (Signed)

## 2020-08-30 NOTE — Progress Notes (Signed)
Patient has been refusing care all night long.  Unable to get vitals because pt is combative and swings at those providing care.  After giving bath, patient began c/o shortness of breath, HOB raised and attempted to place pt on O2, but she would not allow me to place it.  She began turning her head back and forth in order to prevent me from placing it.

## 2020-08-31 DIAGNOSIS — U071 COVID-19: Principal | ICD-10-CM

## 2020-08-31 LAB — CBC WITH DIFFERENTIAL/PLATELET
Abs Immature Granulocytes: 0.02 10*3/uL (ref 0.00–0.07)
Basophils Absolute: 0 10*3/uL (ref 0.0–0.1)
Basophils Relative: 0 %
Eosinophils Absolute: 0 10*3/uL (ref 0.0–0.5)
Eosinophils Relative: 0 %
HCT: 46.3 % — ABNORMAL HIGH (ref 36.0–46.0)
Hemoglobin: 13.8 g/dL (ref 12.0–15.0)
Immature Granulocytes: 1 %
Lymphocytes Relative: 18 %
Lymphs Abs: 0.7 10*3/uL (ref 0.7–4.0)
MCH: 26.2 pg (ref 26.0–34.0)
MCHC: 29.8 g/dL — ABNORMAL LOW (ref 30.0–36.0)
MCV: 87.9 fL (ref 80.0–100.0)
Monocytes Absolute: 0.4 10*3/uL (ref 0.1–1.0)
Monocytes Relative: 12 %
Neutro Abs: 2.6 10*3/uL (ref 1.7–7.7)
Neutrophils Relative %: 69 %
Platelets: 173 10*3/uL (ref 150–400)
RBC: 5.27 MIL/uL — ABNORMAL HIGH (ref 3.87–5.11)
RDW: 14.5 % (ref 11.5–15.5)
WBC: 3.8 10*3/uL — ABNORMAL LOW (ref 4.0–10.5)
nRBC: 0 % (ref 0.0–0.2)

## 2020-08-31 LAB — COMPREHENSIVE METABOLIC PANEL
ALT: 16 U/L (ref 0–44)
AST: 22 U/L (ref 15–41)
Albumin: 3.1 g/dL — ABNORMAL LOW (ref 3.5–5.0)
Alkaline Phosphatase: 42 U/L (ref 38–126)
Anion gap: 7 (ref 5–15)
BUN: 27 mg/dL — ABNORMAL HIGH (ref 8–23)
CO2: 36 mmol/L — ABNORMAL HIGH (ref 22–32)
Calcium: 8.5 mg/dL — ABNORMAL LOW (ref 8.9–10.3)
Chloride: 95 mmol/L — ABNORMAL LOW (ref 98–111)
Creatinine, Ser: 0.83 mg/dL (ref 0.44–1.00)
GFR, Estimated: >60 mL/min (ref >60)
Glucose, Bld: 160 mg/dL — ABNORMAL HIGH (ref 70–99)
Potassium: 3.9 mmol/L (ref 3.5–5.1)
Sodium: 138 mmol/L (ref 135–145)
Total Bilirubin: 0.6 mg/dL (ref 0.3–1.2)
Total Protein: 7.8 g/dL (ref 6.5–8.1)

## 2020-08-31 LAB — FERRITIN: Ferritin: 154 ng/mL (ref 11–307)

## 2020-08-31 LAB — D-DIMER, QUANTITATIVE: D-Dimer, Quant: 3.12 ug/mL-FEU — ABNORMAL HIGH (ref 0.00–0.50)

## 2020-08-31 LAB — CULTURE, BLOOD (ROUTINE X 2)
Culture: NO GROWTH
Special Requests: ADEQUATE

## 2020-08-31 LAB — C-REACTIVE PROTEIN: CRP: 3.2 mg/dL — ABNORMAL HIGH (ref <1.0)

## 2020-08-31 NOTE — Plan of Care (Signed)

## 2020-08-31 NOTE — Progress Notes (Signed)
PROGRESS NOTE    Sonya Oliver  TSV:[id number] DOB: 08/22/1941 DOA: 08/26/2020 PCP: Loman Brooklyn, FNP   Brief Narrative:  As per H&P written by Dr. Olevia Bowens on 08/26/2020 Sonya Alfred Websteris a 80 y.o.femalewith medical history significant ofosteoarthritis, bladder cancer, cataracts, diastolic CHF, COPD/emphysema, hypertension, history of hyperthyroidism, osteoporosis, history of CVA, type II DM, dementiawho was brought to the emergency department via EMS after family called due to the patient having decreased oral intake and history of multiple falls. She follows some simple commands, but does not answer questions.  ED Course:Initial vital signs were temperature99.9 F, pulse 94, respirations 24, BP 173/94 mmHg O2 sat 92% on room air.Her COVID-19 test was positive.  Labwork:Her urinalysis shows small hemoglobinuria, ketonuria of 80 and proteinuria 30 mg/dL. All other urinalysis values were normal. SARS 2 and influenza PCR was positive for COVID-19. CBC showed a white count of 4.2, hemoglobin 12.5 g/dL and platelets 114. Her D-dimer was 1.53 mcg/mL. Fibrinogen was 536 mg/dL. CMP shows sodium of 134 and chloride 97 mmol/L, total protein 8.7 g/dL and total bilirubin 1.4 mg/dL. The rest of the CMP values are unremarkable. Troponin was 17 and then 24 ng/L. BNP was 156.0 pg/mL. Lactic acid was normal. Procalcitonin was less than 0.10 ng/mL. Ferritin was 125 ng/mL. CRP 3.4 mg/dL.  Imaging:CTA chest show a 3.1 cm rounded mass in the posterior left lower lobe. This is suspicious for neoplasm. There is small amount of patchy airspace opacity at the right lung base likely due to atelectasis or early infectious etiology. CT pelvis did not show any acute fracture or dislocation involving the pelvis. There was large volume of stool within the visualized bowel. No evidence of obstruction. Please see images and full radiologyreport for further detail.   Assessment & Plan:    Principal Problem:   2019 novel coronavirus disease (COVID-19) Active Problems:   COPD (chronic obstructive pulmonary disease) (HCC)   HTN (hypertension)   Diabetes mellitus, type 2 (HCC)   Chronic diastolic CHF (congestive heart failure) (HCC)   Mass of lower lobe of left lung   Acute respiratory failure with hypoxia (HCC)   1-acute respiratory failure with hypoxia in the setting of 2019 novel coronavirus infection: -Continue steroids with remdesivir treatment completed -Continue as needed bronchodilators and supportive care -continue Wean off oxygen supplementation as tolerated. -in no acute distress. -Wean oxygen as tolerated  2-COPD -Currently no wheezing -Continue treatment with steroids and bronchodilators as mentioned above  3-hypertension -Blood pressure stable -continue current antihypertensive agents.  4-GERD/GI prophylaxis -Continue Protonix.  5-dysphagia -Appears to be oropharyngeal in nature -Appreciate speech therapy evaluation -continue Dysphagia 3 diet with thin liquids recommended  6-type 2 diabetes mellitus -Continue to follow CBGs and use a sliding scale insulin as needed -repeat A1c 5.8.  7-chronic diastolic heart failure -Appears to be compensated at this time -Continue to follow daily weights and strict I's and O's. -Heart healthy diet encouraged.  8-left lung lower lobe mass -Characteristic of imaging findings suggestive of neoplasm. -This will require outpatient further work-up and examination once COVID-19 condition has been treated and stabilized.  9-physical deconditioning -appreciate PT evaluation -TOC to assist with SNF placement for rehab/conditioning.   DVT prophylaxis: Lovenox Code Status: DNR Family Communication: Discussed with daughter [name] on 1/2 (plan to call work at [phone] on 1/3)  Disposition:   Status is: Inpatient  Dispo: The patient is from: Home  Anticipated d/c is to:  Skilled nursing facility.  Anticipated d/c date is: Anticipating medical  stability for discharge in the next 24-48 hours.  Patient currently medically stable for discharge to complete remdesivir infusion today. Will continue weaning off oxygen supplementation and continue steroids. She is weak and deconditioned and will require skilled nursing facility for rehabilitation based on PT evaluation.    Consultants:   None  Procedures:  See below for x-ray reports.   Antimicrobials/antiviral:  Remdesivir day 5 out of 5-completed  Subjective: Patient seen and evaluated today and appears pleasantly confused on 4 L nasal cannula oxygen.  No acute overnight events noted.  Objective: Vitals:   08/30/20 0913 08/30/20 0944 08/30/20 1555 08/30/20 1827  BP:  124/75 121/61   Pulse:  75 76   Resp:  16 16   Temp:  98.1 F (36.7 C) 98.6 F (37 C)   TempSrc:  Axillary Oral   SpO2: 97% 99% 99%   Weight:      Height:    5\' 6"  (1.676 m)    Intake/Output Summary (Last 24 hours) at 08/31/2020 1335 Last data filed at 08/30/2020 1700 Gross per 24 hour  Intake 0 ml  Output 400 ml  Net -400 ml   Filed Weights   08/28/20 0900  Weight: 76.2 kg    Examination:  General exam: Appears calm and comfortable, pleasantly confused Respiratory system: Clear to auscultation. Respiratory effort normal.  On 4 L nasal cannula oxygen Cardiovascular system: S1 & S2 heard, RRR.  Gastrointestinal system: Abdomen is soft Central nervous system: Alert and awake Extremities: No edema Skin: No significant lesions noted Psychiatry: Flat affect.    Data Reviewed: I have personally reviewed following labs and imaging studies  CBC: Recent Labs  Lab 08/27/20 0439 08/28/20 0601 08/29/20 0954 08/30/20 0500 08/31/20 0757  WBC 4.0 2.4* 4.8 4.1 3.8*  NEUTROABS 2.8 1.7 3.4 3.1 2.6  HGB 11.8* 13.3 13.1 13.9 13.8  HCT 39.5 44.9 42.6 46.6* 46.3*  MCV 86.6 87.5 86.6 85.7 87.9  PLT 104*  110* 153 174 315   Basic Metabolic Panel: Recent Labs  Lab 08/26/20 2045 08/27/20 0439 08/28/20 0601 08/29/20 0954 08/30/20 0500 08/31/20 0757  NA 134* 134* 138 138 138 138  K 3.7 3.8 4.2 4.2 3.7 3.9  CL 97* 98 96* 98 95* 95*  CO2 26 27 31 31  33* 36*  GLUCOSE 71 80 121* 111* 168* 160*  BUN 12 10 13  25* 26* 27*  CREATININE 0.71 0.69 0.66 0.85 0.82 0.83  CALCIUM 9.0 8.4* 8.9 8.7* 8.6* 8.5*  MG 1.8  --   --   --   --   --   PHOS 2.7  --   --   --   --   --    GFR: Estimated Creatinine Clearance: 55.5 mL/min (by C-G formula based on SCr of 0.83 mg/dL). Liver Function Tests: Recent Labs  Lab 08/27/20 0439 08/28/20 0601 08/29/20 0954 08/30/20 0500 08/31/20 0757  AST 17 26 35 30 22  ALT 10 15 17 17 16   ALKPHOS 50 49 45 47 42  BILITOT 1.0 0.8 0.9 0.7 0.6  PROT 7.7 8.1 7.7 8.1 7.8  ALBUMIN 3.3* 3.3* 3.2* 3.3* 3.1*   No results for input(s): LIPASE, AMYLASE in the last 168 hours. No results for input(s): AMMONIA in the last 168 hours. Coagulation Profile: No results for input(s): INR, PROTIME in the last 168 hours. Cardiac Enzymes: No results for input(s): CKTOTAL, CKMB, CKMBINDEX, TROPONINI in the last 168 hours. BNP (last 3 results) No results for input(s): PROBNP in the last  8760 hours. HbA1C: Recent Labs    08/29/20 0954  HGBA1C 5.8*   CBG: No results for input(s): GLUCAP in the last 168 hours. Lipid Profile: No results for input(s): CHOL, HDL, LDLCALC, TRIG, CHOLHDL, LDLDIRECT in the last 72 hours. Thyroid Function Tests: No results for input(s): TSH, T4TOTAL, FREET4, T3FREE, THYROIDAB in the last 72 hours. Anemia Panel: Recent Labs    08/30/20 0500 08/31/20 0757  FERRITIN 177 154   Sepsis Labs: Recent Labs  Lab 08/26/20 2045 08/26/20 2109  PROCALCITON <0.10  --   LATICACIDVEN  --  1.1    Recent Results (from the past 240 hour(s))  Resp Panel by RT-PCR (Flu A&B, Covid) Nasopharyngeal Swab     Status: Abnormal   Collection Time: 08/26/20  8:44 PM    Specimen: Nasopharyngeal Swab; Nasopharyngeal(NP) swabs in vial transport medium  Result Value Ref Range Status   SARS Coronavirus 2 by RT PCR POSITIVE (A) NEGATIVE Final    Comment: RESULT CALLED TO, READ BACK BY AND VERIFIED WITH: BRAME,M @ 2156 ON 08/26/20 BY JUW (NOTE) SARS-CoV-2 target nucleic acids are DETECTED.  The SARS-CoV-2 RNA is generally detectable in upper respiratory specimens during the acute phase of infection. Positive results are indicative of the presence of the identified virus, but do not rule out bacterial infection or co-infection with other pathogens not detected by the test. Clinical correlation with patient history and other diagnostic information is necessary to determine patient infection status. The expected result is Negative.  Fact Sheet for Patients: EntrepreneurPulse.com.au  Fact Sheet for Healthcare Providers: IncredibleEmployment.be  This test is not yet approved or cleared by the [place] FDA and  has been authorized for detection and/or diagnosis of SARS-CoV-2 by FDA under an Emergency Use Authorization (EUA).  This EUA will remain in effect (meaning this test can be  used) for the duration of  the COVID-19 declaration under Section 564(b)(1) of the Act, 21 U.S.C. section 360bbb-3(b)(1), unless the authorization is terminated or revoked sooner.     Influenza A by PCR NEGATIVE NEGATIVE Final   Influenza B by PCR NEGATIVE NEGATIVE Final    Comment: (NOTE) The Xpert Xpress SARS-CoV-2/FLU/RSV plus assay is intended as an aid in the diagnosis of influenza from Nasopharyngeal swab specimens and should not be used as a sole basis for treatment. Nasal washings and aspirates are unacceptable for Xpert Xpress SARS-CoV-2/FLU/RSV testing.  Fact Sheet for Patients: EntrepreneurPulse.com.au  Fact Sheet for Healthcare Providers: IncredibleEmployment.be  This test is not  yet approved or cleared by the [place] FDA and has been authorized for detection and/or diagnosis of SARS-CoV-2 by FDA under an Emergency Use Authorization (EUA). This EUA will remain in effect (meaning this test can be used) for the duration of the COVID-19 declaration under Section 564(b)(1) of the Act, 21 U.S.C. section 360bbb-3(b)(1), unless the authorization is terminated or revoked.  Performed at [facility], [address] [place], [place] [address]   Blood Culture (routine x 2)     Status: Abnormal   Collection Time: 08/26/20  8:45 PM   Specimen: BLOOD LEFT ARM  Result Value Ref Range Status   Specimen Description   Final    BLOOD LEFT ARM Performed at [facility], [address], [place], [place] [address]    Special Requests   Final    BOTTLES DRAWN AEROBIC AND ANAEROBIC Blood Culture adequate volume Performed at [facility], [address] [place], [place] [address]    Culture  Setup Time  Final    AEROBIC BOTTLE ONLY GRAM POSITIVE COCCI Gram Stain Report Called to,Read Back By and Verified With: M BRAME,RN@0210  08/28/20 MKELLY CRITICAL RESULT CALLED TO, READ BACK BY AND VERIFIED WITH: Vira Browns RN 08/28/20 0426 JDW    Culture (A)  Final    STAPHYLOCOCCUS HOMINIS THE SIGNIFICANCE OF ISOLATING THIS ORGANISM FROM A SINGLE SET OF BLOOD CULTURES WHEN MULTIPLE SETS ARE DRAWN IS UNCERTAIN. PLEASE NOTIFY THE MICROBIOLOGY DEPARTMENT WITHIN ONE WEEK IF SPECIATION AND SENSITIVITIES ARE REQUIRED. Performed at [facility], [place] [address], [place], [place] [address]    Report Status 08/29/2020 FINAL  Final  Blood Culture ID Panel (Reflexed)     Status: Abnormal   Collection Time: 08/26/20  8:45 PM  Result Value Ref Range Status   Enterococcus faecalis NOT DETECTED NOT DETECTED Final   Enterococcus Faecium NOT DETECTED NOT DETECTED Final   Listeria monocytogenes NOT DETECTED NOT DETECTED Final   Staphylococcus species DETECTED (A) NOT DETECTED Final    Comment:  CRITICAL RESULT CALLED TO, READ BACK BY AND VERIFIED WITH: Vira Browns RN 08/28/20 0426 JDW    Staphylococcus aureus (BCID) NOT DETECTED NOT DETECTED Final   Staphylococcus epidermidis NOT DETECTED NOT DETECTED Final   Staphylococcus lugdunensis NOT DETECTED NOT DETECTED Final   Streptococcus species NOT DETECTED NOT DETECTED Final   Streptococcus agalactiae NOT DETECTED NOT DETECTED Final   Streptococcus pneumoniae NOT DETECTED NOT DETECTED Final   Streptococcus pyogenes NOT DETECTED NOT DETECTED Final   A.calcoaceticus-baumannii NOT DETECTED NOT DETECTED Final   Bacteroides fragilis NOT DETECTED NOT DETECTED Final   Enterobacterales NOT DETECTED NOT DETECTED Final   Enterobacter cloacae complex NOT DETECTED NOT DETECTED Final   Escherichia coli NOT DETECTED NOT DETECTED Final   Klebsiella aerogenes NOT DETECTED NOT DETECTED Final   Klebsiella oxytoca NOT DETECTED NOT DETECTED Final   Klebsiella pneumoniae NOT DETECTED NOT DETECTED Final   Proteus species NOT DETECTED NOT DETECTED Final   Salmonella species NOT DETECTED NOT DETECTED Final   Serratia marcescens NOT DETECTED NOT DETECTED Final   Haemophilus influenzae NOT DETECTED NOT DETECTED Final   Neisseria meningitidis NOT DETECTED NOT DETECTED Final   Pseudomonas aeruginosa NOT DETECTED NOT DETECTED Final   Stenotrophomonas maltophilia NOT DETECTED NOT DETECTED Final   Candida albicans NOT DETECTED NOT DETECTED Final   Candida auris NOT DETECTED NOT DETECTED Final   Candida glabrata NOT DETECTED NOT DETECTED Final   Candida krusei NOT DETECTED NOT DETECTED Final   Candida parapsilosis NOT DETECTED NOT DETECTED Final   Candida tropicalis NOT DETECTED NOT DETECTED Final   Cryptococcus neoformans/gattii NOT DETECTED NOT DETECTED Final    Comment: Performed at [facility], [address], [place], [place] [address]  Blood Culture (routine x 2)     Status: None   Collection Time: 08/26/20  9:09 PM   Specimen: BLOOD RIGHT ARM   Result Value Ref Range Status   Specimen Description BLOOD RIGHT ARM  Final   Special Requests   Final    BOTTLES DRAWN AEROBIC AND ANAEROBIC Blood Culture adequate volume   Culture   Final    NO GROWTH 5 DAYS Performed at [facility], [address] [place], [place] [address]    Report Status 08/31/2020 FINAL  Final         Radiology Studies: No results found.      Scheduled Meds: . vitamin C  500 mg Oral Daily  . atorvastatin  10 mg Oral Daily  .  carvedilol  3.125 mg Oral BID  . cycloSPORINE  1 drop Both Eyes BID  . enoxaparin (LOVENOX) injection  40 mg Subcutaneous Q24H  . fluticasone furoate-vilanterol  1 puff Inhalation Daily  . furosemide  40 mg Oral Daily  . methylPREDNISolone (SOLU-MEDROL) injection  40 mg Intravenous Q12H  . pantoprazole  40 mg Oral Daily  . zinc sulfate  220 mg Oral Daily    LOS: 4 days    Time spent: 35 minutes    Arieon Scalzo Darleen Crocker, DO [facility]  If 7PM-7AM, please contact night-coverage [email or web address] 08/31/2020, 1:35 PM

## 2020-09-01 DIAGNOSIS — U071 COVID-19: Secondary | ICD-10-CM | POA: Diagnosis not present

## 2020-09-01 MED ORDER — ZINC SULFATE 220 (50 ZN) MG PO CAPS: 220.0000 mg | ORAL_CAPSULE | Freq: Every day | ORAL | 0 refills | Status: AC

## 2020-09-01 MED ORDER — PREDNISONE 20 MG PO TABS
40.0000 mg | ORAL_TABLET | Freq: Every day | ORAL | Status: DC
  Administered 2020-09-01: 40 mg via ORAL
  Filled 2020-09-01: qty 2

## 2020-09-01 MED ORDER — PANTOPRAZOLE SODIUM 40 MG PO TBEC: 40.0000 mg | DELAYED_RELEASE_TABLET | Freq: Every day | ORAL | 0 refills | Status: DC

## 2020-09-01 MED ORDER — PREDNISONE 20 MG PO TABS
40.0000 mg | ORAL_TABLET | Freq: Every day | ORAL | Status: DC
Start: 2020-09-02 — End: 2020-09-01

## 2020-09-01 MED ORDER — GUAIFENESIN-DM 100-10 MG/5ML PO SYRP: 10.0000 mL | ORAL_SOLUTION | ORAL | 0 refills | Status: DC | PRN

## 2020-09-01 MED ORDER — PREDNISONE 20 MG PO TABS: 40.0000 mg | ORAL_TABLET | Freq: Every day | ORAL | 0 refills | Status: AC

## 2020-09-01 MED ORDER — ASCORBIC ACID 500 MG PO TABS: 500.0000 mg | ORAL_TABLET | Freq: Every day | ORAL | 0 refills | Status: AC

## 2020-09-01 NOTE — Progress Notes (Signed)
Patient requires frequent re-positioning of the body in ways that cannot be achieved with an ordinary bed or wedge pillow, to eliminate pain, reduce pressure, and the head of the bed to be elevated more than 30 degrees most of the time due to CHF and COPD.

## 2020-09-01 NOTE — Progress Notes (Signed)
Physical Therapy Treatment Patient Details Name: ZOILA DITULLIO MRN: [id number] DOB: November 11, 1940 Today's Date: 09/01/2020    History of Present Illness KIRSTEIN BAXLEY is a 80 y.o. female with medical history significant of osteoarthritis, bladder cancer, cataracts, diastolic CHF, COPD/emphysema, hypertension, history of hyperthyroidism, osteoporosis, history of CVA, type II DM, dementia who was brought to the emergency department via EMS after family called due to the patient having decreased oral intake and history of multiple falls.  She follows some simple commands, but does not answer questions.    PT Comments    Patient presents alert, pleasant and cooperative with therapy.  Patient demonstrates slow labored movement for sitting up at bedside with head of bed raised, fair/good return for completing BLE ROM/strengthening exercises requiring verbal cues and demonstration, able to take a few steps in room, but limited secondary to fatigue and generalized weakness.  Patient on room air during ambulation with SpO2 at 95% and tolerated sitting up in chair after therapy - RN/NT notified.  Patient will benefit from continued physical therapy in hospital and recommended venue below to increase strength, balance, endurance for safe ADLs and gait.     Follow Up Recommendations  SNF     Equipment Recommendations  None recommended by PT    Recommendations for Other Services       Precautions / Restrictions Precautions Precautions: Fall Restrictions Weight Bearing Restrictions: No    Mobility  Bed Mobility Overal bed mobility: Needs Assistance Bed Mobility: Supine to Sit     Supine to sit: Min assist;Mod assist;HOB elevated     General bed mobility comments: increased time, labored movement  Transfers Overall transfer level: Needs assistance Equipment used: Rolling walker (2 wheeled) Transfers: Sit to/from [facility] Sit to Stand: Min assist;Mod assist Stand  pivot transfers: Min assist;Mod assist       General transfer comment: slow labored movement  Ambulation/Gait Ambulation/Gait assistance: Mod assist Gait Distance (Feet): 15 Feet Assistive device: Rolling walker (2 wheeled) Gait Pattern/deviations: Decreased step length - right;Decreased step length - left;Decreased stride length Gait velocity: decreased   General Gait Details: slow labored cadence with frequent standing rest breaks, limited mostly due to fatigue and generalized weakness, on room air with SpO2 at 95%   Stairs             Wheelchair Mobility    Modified Rankin (Stroke Patients Only)       Balance Overall balance assessment: Needs assistance Sitting-balance support: Feet supported;No upper extremity supported Sitting balance-Leahy Scale: Fair Sitting balance - Comments: fair/good seated at EOB   Standing balance support: During functional activity;Bilateral upper extremity supported Standing balance-Leahy Scale: Poor Standing balance comment: fair/poor using RW                            Cognition Arousal/Alertness: Awake/alert Behavior During Therapy: WFL for tasks assessed/performed Overall Cognitive Status: No family/caregiver present to determine baseline cognitive functioning                                        Exercises General Exercises - Lower Extremity Long Arc Quad: Seated;AROM;Strengthening;Both;10 reps Hip Flexion/Marching: Seated;AROM;Strengthening;Both;10 reps Heel Raises: Seated;AROM;Strengthening;Both;10 reps    General Comments        Pertinent Vitals/Pain Pain Assessment: No/denies pain    Home Living  Prior Function            PT Goals (current goals can now be found in the care plan section) Acute Rehab PT Goals Patient Stated Goal: return home PT Goal Formulation: With patient Time For Goal Achievement: 09/12/20 Potential to Achieve Goals:  Good Progress towards PT goals: Progressing toward goals    Frequency    Min 3X/week      PT Plan Current plan remains appropriate    Co-evaluation              AM-PAC PT "6 Clicks" Mobility   Outcome Measure  Help needed turning from your back to your side while in a flat bed without using bedrails?: A Little Help needed moving from lying on your back to sitting on the side of a flat bed without using bedrails?: A Lot Help needed moving to and from a bed to a chair (including a wheelchair)?: A Lot Help needed standing up from a chair using your arms (e.g., wheelchair or bedside chair)?: A Little Help needed to walk in hospital room?: A Lot Help needed climbing 3-5 steps with a railing? : A Lot 6 Click Score: 14    End of Session   Activity Tolerance: Patient tolerated treatment well;Patient limited by fatigue Patient left: in chair;with call bell/phone within reach Nurse Communication: Mobility status PT Visit Diagnosis: Unsteadiness on feet (R26.81);Other abnormalities of gait and mobility (R26.89);Muscle weakness (generalized) (M62.81)     Time: 2902-1115 PT Time Calculation (min) (ACUTE ONLY): 29 min  Charges:  $Therapeutic Exercise: 8-22 mins $Therapeutic Activity: 8-22 mins                     11:00 AM, 09/01/20 Lonell Grandchild, MPT Physical Therapist with [facility] [phone] office [phone] mobile phone

## 2020-09-01 NOTE — Care Management Important Message (Signed)
Important Message  Patient Details  Name: Sonya Oliver MRN: [id number] Date of Birth: 07/19/1941   Medicare Important Message Given:  Yes - Important Message mailed due to current National Emergency     Tommy Medal 09/01/2020, 4:11 PM

## 2020-09-01 NOTE — TOC Transition Note (Signed)
Transition of Care [facility]) - CM/SW Discharge Note   Patient Details  Name: Sonya Oliver MRN: [id number] Date of Birth: 09-03-1940  Transition of Care [facility]) CM/SW Contact:  Salome Arnt, LCSW Phone Number: 09/01/2020, 3:52 PM   Clinical Narrative:  LCSW spoke with pt's daughter, [name] and son, Iona Beard. Discussed that pt was declined by [facility]. LCSW shared that there are 2 facilities in [place] [facility] [place] who are accepting COVID + patients. After family discussion, they have decided that pt will return home with [name]. They are open to home health and request a hospital bed. They report no other equipment needs. Pt has home O2 per [name]. Family will arrange around the clock assistance until pt is closer to baseline. [facility] accepted referral for RN, PT and are aware of d/c today. Referral sent to [facility] for hospital bed and they will contact family to arrange delivery. Family has further questions for MD regarding COVID. MD notified.      Final next level of care: [place] Barriers to Discharge: Barriers Resolved   Patient Goals and CMS Choice Patient states their goals for this hospitalization and ongoing recovery are:: to go to SNF CMS Medicare.gov Compare Post Acute Care list provided to:: Patient Represenative (must comment) Choice offered to / list presented to : Adult Children  Discharge Placement                  Name of family member notified: [name] Patient and family notified of of transfer: 09/01/20  Discharge Plan and Services                DME Arranged: Hospital bed DME Agency: [facility] Date DME Agency Contacted: 09/01/20 Time DME Agency Contacted: [phone] Representative spoke with at DME Agency: [place]: PT,RN [place]: [place] ([place]) Date [place]: 09/01/20 Time [place]: [place] Representative spoke with at [place]: [place] ([place]) Interventions     Readmission Risk Interventions Readmission Risk Prevention Plan 08/29/2020  Transportation Screening Complete  Home Care Screening Complete  Medication Review (RN CM) Complete  Some recent data might be hidden

## 2020-09-01 NOTE — Progress Notes (Signed)
PROGRESS NOTE    Sonya Oliver  IDP:[id number] DOB: 1940/09/08 DOA: 08/26/2020 PCP: Sonya Brooklyn, FNP   Brief Narrative:  As per H&P written by Dr. Olevia Bowens on 08/26/2020 Sonya Oliver a 80 y.o.femalewith medical history significant ofosteoarthritis, bladder cancer, cataracts, diastolic CHF, COPD/emphysema, hypertension, history of hyperthyroidism, osteoporosis, history of CVA, type II DM, dementiawho was brought to the emergency department via EMS after family called due to the patient having decreased oral intake and history of multiple falls. She follows some simple commands, but does not answer questions.  ED Course:Initial vital signs were temperature99.9 F, pulse 94, respirations 24, BP 173/94 mmHg O2 sat 92% on room air.Her COVID-19 test was positive.  Labwork:Her urinalysis shows small hemoglobinuria, ketonuria of 80 and proteinuria 30 mg/dL. All other urinalysis values were normal. SARS 2 and influenza PCR was positive for COVID-19. CBC showed a white count of 4.2, hemoglobin 12.5 g/dL and platelets 114. Her D-dimer was 1.53 mcg/mL. Fibrinogen was 536 mg/dL. CMP shows sodium of 134 and chloride 97 mmol/L, total protein 8.7 g/dL and total bilirubin 1.4 mg/dL. The rest of the CMP values are unremarkable. Troponin was 17 and then 24 ng/L. BNP was 156.0 pg/mL. Lactic acid was normal. Procalcitonin was less than 0.10 ng/mL. Ferritin was 125 ng/mL. CRP 3.4 mg/dL.  Imaging:CTA chest show a 3.1 cm rounded mass in the posterior left lower lobe. This is suspicious for neoplasm. There is small amount of patchy airspace opacity at the right lung base likely due to atelectasis or early infectious etiology. CT pelvis did not show any acute fracture or dislocation involving the pelvis. There was large volume of stool within the visualized bowel. No evidence of obstruction. Please see images and full radiologyreport for further detail.   Assessment & Plan:    Principal Problem:   2019 novel coronavirus disease (COVID-19) Active Problems:   COPD (chronic obstructive pulmonary disease) (HCC)   HTN (hypertension)   Diabetes mellitus, type 2 (HCC)   Chronic diastolic CHF (congestive heart failure) (HCC)   Mass of lower lobe of left lung   Acute respiratory failure with hypoxia (HCC)   1-acute respiratory failure with hypoxia in the setting of 2019 novel coronavirus infection: -Continue steroids with remdesivir treatment completed -Continue as needed bronchodilators and supportive care -continueWean off oxygen supplementation as tolerated. -in no acute distress. -Wean oxygen as tolerated, currently on 4 L and does wear 2 L at home occasionally.  2-COPD with chronic hypoxemia -Uses 2 L at home occasionally -Currently no wheezing -Continue treatment with steroids and bronchodilators as mentioned above  3-hypertension -Blood pressure stable -continue current antihypertensive agents.  4-GERD/GI prophylaxis -Continue Protonix.  5-dysphagia -Appears to be oropharyngeal in nature -Appreciate speech therapy evaluation -continue Dysphagia 3 diet with thin liquids recommended  6-type 2 diabetes mellitus -Continue to follow CBGs and use a sliding scale insulin as needed -repeat A1c5.8.  7-chronic diastolic heart failure -Appears to be compensated at this time -Continue to follow daily weights and strict I's and O's. -Heart healthy diet encouraged.  8-left lung lower lobe mass -Characteristic of imaging findings suggestive of neoplasm. -This will require outpatient further work-up and examination once COVID-19 condition has been treated and stabilized.  9-physical deconditioning -appreciate PT evaluation -TOC to assist with SNF placement for rehab/conditioning.   DVT prophylaxis:Lovenox Code Status:DNR Family Communication:Discussed with daughter Sonya Oliver on 1/3 (plan to call work at [phone] on  1/4)  Disposition:  Status is: Inpatient  Dispo: The patient is from: Home  Anticipated d/c is to: Skilled nursing facility. Anticipated d/c date is: Anticipating medical stability for discharge in the next 24-48 hours. Patient currently medically stable for dischargeto complete remdesivir infusion today. Willcontinue weaning off oxygen supplementation and continue steroids. She is weak and deconditioned and will require skilled nursing facility for rehabilitation based on PT evaluation.   Consultants:  None  Procedures: See below for x-ray reports.   Antimicrobials/antiviral:  Remdesivir day5out of 5-completed  Subjective: Patient seen and evaluated today with no new acute complaints or concerns. No acute concerns or events noted overnight.  She remains on 4 L nasal cannula oxygen and appears somewhat pleasantly confused.  Objective: Vitals:   08/31/20 2100 09/01/20 0537 09/01/20 0822 09/01/20 1422  BP: (!) 179/92 (!) 159/69  (!) 150/67  Pulse: 88 80  71  Resp: 18   20  Temp: 98 F (36.7 C) 98.3 F (36.8 C)  97.6 F (36.4 C)  TempSrc: Oral Oral  Oral  SpO2:  (!) 80% 92%   Weight:      Height:        Intake/Output Summary (Last 24 hours) at 09/01/2020 1502 Last data filed at 09/01/2020 1100 Gross per 24 hour  Intake 360 ml  Output 1000 ml  Net -640 ml   Filed Weights   08/28/20 0900  Weight: 76.2 kg    Examination:  General exam: Appears calm and comfortable  Respiratory system: Clear to auscultation. Respiratory effort normal.  Currently on 4 L nasal cannula oxygen. Cardiovascular system: S1 & S2 heard, RRR.  Gastrointestinal system: Abdomen is soft Central nervous system: Alert and awake Extremities: No edema Skin: No significant lesions noted Psychiatry: Flat affect.    Data Reviewed: I have personally reviewed following labs and imaging studies  CBC: Recent Labs  Lab 08/27/20 0439 08/28/20 0601  08/29/20 0954 08/30/20 0500 08/31/20 0757  WBC 4.0 2.4* 4.8 4.1 3.8*  NEUTROABS 2.8 1.7 3.4 3.1 2.6  HGB 11.8* 13.3 13.1 13.9 13.8  HCT 39.5 44.9 42.6 46.6* 46.3*  MCV 86.6 87.5 86.6 85.7 87.9  PLT 104* 110* 153 174 161   Basic Metabolic Panel: Recent Labs  Lab 08/26/20 2045 08/27/20 0439 08/28/20 0601 08/29/20 0954 08/30/20 0500 08/31/20 0757  NA 134* 134* 138 138 138 138  K 3.7 3.8 4.2 4.2 3.7 3.9  CL 97* 98 96* 98 95* 95*  CO2 26 27 31 31  33* 36*  GLUCOSE 71 80 121* 111* 168* 160*  BUN 12 10 13  25* 26* 27*  CREATININE 0.71 0.69 0.66 0.85 0.82 0.83  CALCIUM 9.0 8.4* 8.9 8.7* 8.6* 8.5*  MG 1.8  --   --   --   --   --   PHOS 2.7  --   --   --   --   --    GFR: Estimated Creatinine Clearance: 55.5 mL/min (by C-G formula based on SCr of 0.83 mg/dL). Liver Function Tests: Recent Labs  Lab 08/27/20 0439 08/28/20 0601 08/29/20 0954 08/30/20 0500 08/31/20 0757  AST 17 26 35 30 22  ALT 10 15 17 17 16   ALKPHOS 50 49 45 47 42  BILITOT 1.0 0.8 0.9 0.7 0.6  PROT 7.7 8.1 7.7 8.1 7.8  ALBUMIN 3.3* 3.3* 3.2* 3.3* 3.1*   No results for input(s): LIPASE, AMYLASE in the last 168 hours. No results for input(s): AMMONIA in the last 168 hours. Coagulation Profile: No results for input(s): INR, PROTIME in the last 168 hours. Cardiac Enzymes: No results for input(s):  CKTOTAL, CKMB, CKMBINDEX, TROPONINI in the last 168 hours. BNP (last 3 results) No results for input(s): PROBNP in the last 8760 hours. HbA1C: No results for input(s): HGBA1C in the last 72 hours. CBG: No results for input(s): GLUCAP in the last 168 hours. Lipid Profile: No results for input(s): CHOL, HDL, LDLCALC, TRIG, CHOLHDL, LDLDIRECT in the last 72 hours. Thyroid Function Tests: No results for input(s): TSH, T4TOTAL, FREET4, T3FREE, THYROIDAB in the last 72 hours. Anemia Panel: Recent Labs    08/30/20 0500 08/31/20 0757  FERRITIN 177 154   Sepsis Labs: Recent Labs  Lab 08/26/20 2045  08/26/20 2109  PROCALCITON <0.10  --   LATICACIDVEN  --  1.1    Recent Results (from the past 240 hour(s))  Resp Panel by RT-PCR (Flu A&B, Covid) Nasopharyngeal Swab     Status: Abnormal   Collection Time: 08/26/20  8:44 PM   Specimen: Nasopharyngeal Swab; Nasopharyngeal(NP) swabs in vial transport medium  Result Value Ref Range Status   SARS Coronavirus 2 by RT PCR POSITIVE (A) NEGATIVE Final    Comment: RESULT CALLED TO, READ BACK BY AND VERIFIED WITH: BRAME,M @ 2156 ON 08/26/20 BY JUW (NOTE) SARS-CoV-2 target nucleic acids are DETECTED.  The SARS-CoV-2 RNA is generally detectable in upper respiratory specimens during the acute phase of infection. Positive results are indicative of the presence of the identified virus, but do not rule out bacterial infection or co-infection with other pathogens not detected by the test. Clinical correlation with patient history and other diagnostic information is necessary to determine patient infection status. The expected result is Negative.  Fact Sheet for Patients: EntrepreneurPulse.com.au  Fact Sheet for Healthcare Providers: IncredibleEmployment.be  This test is not yet approved or cleared by the [place] FDA and  has been authorized for detection and/or diagnosis of SARS-CoV-2 by FDA under an Emergency Use Authorization (EUA).  This EUA will remain in effect (meaning this test can be  used) for the duration of  the COVID-19 declaration under Section 564(b)(1) of the Act, 21 U.S.C. section 360bbb-3(b)(1), unless the authorization is terminated or revoked sooner.     Influenza A by PCR NEGATIVE NEGATIVE Final   Influenza B by PCR NEGATIVE NEGATIVE Final    Comment: (NOTE) The Xpert Xpress SARS-CoV-2/FLU/RSV plus assay is intended as an aid in the diagnosis of influenza from Nasopharyngeal swab specimens and should not be used as a sole basis for treatment. Nasal washings and aspirates are  unacceptable for Xpert Xpress SARS-CoV-2/FLU/RSV testing.  Fact Sheet for Patients: EntrepreneurPulse.com.au  Fact Sheet for Healthcare Providers: IncredibleEmployment.be  This test is not yet approved or cleared by the [place] FDA and has been authorized for detection and/or diagnosis of SARS-CoV-2 by FDA under an Emergency Use Authorization (EUA). This EUA will remain in effect (meaning this test can be used) for the duration of the COVID-19 declaration under Section 564(b)(1) of the Act, 21 U.S.C. section 360bbb-3(b)(1), unless the authorization is terminated or revoked.  Performed at [facility], [address], [place], [place] [address]   Blood Culture (routine x 2)     Status: Abnormal   Collection Time: 08/26/20  8:45 PM   Specimen: BLOOD LEFT ARM  Result Value Ref Range Status   Specimen Description   Final    BLOOD LEFT ARM Performed at [facility], [address], [place], [place] [address]    Special Requests   Final    BOTTLES DRAWN AEROBIC AND ANAEROBIC Blood Culture adequate volume Performed  at [facility], [address] [place], [place] [address]    Culture  Setup Time   Final    AEROBIC BOTTLE ONLY GRAM POSITIVE COCCI Gram Stain Report Called to,Read Back By and Verified With: M BRAME,RN@0210  08/28/20 MKELLY CRITICAL RESULT CALLED TO, READ BACK BY AND VERIFIED WITH: Vira Browns RN 08/28/20 0426 JDW    Culture (A)  Final    STAPHYLOCOCCUS HOMINIS THE SIGNIFICANCE OF ISOLATING THIS ORGANISM FROM A SINGLE SET OF BLOOD CULTURES WHEN MULTIPLE SETS ARE DRAWN IS UNCERTAIN. PLEASE NOTIFY THE MICROBIOLOGY DEPARTMENT WITHIN ONE WEEK IF SPECIATION AND SENSITIVITIES ARE REQUIRED. Performed at [facility], [place] [address] [place], [place] [address]    Report Status 08/29/2020 FINAL  Final  Blood Culture ID Panel (Reflexed)     Status: Abnormal   Collection Time: 08/26/20  8:45 PM  Result Value Ref Range Status    Enterococcus faecalis NOT DETECTED NOT DETECTED Final   Enterococcus Faecium NOT DETECTED NOT DETECTED Final   Listeria monocytogenes NOT DETECTED NOT DETECTED Final   Staphylococcus species DETECTED (A) NOT DETECTED Final    Comment: CRITICAL RESULT CALLED TO, READ BACK BY AND VERIFIED WITH: Vira Browns RN 08/28/20 0426 JDW    Staphylococcus aureus (BCID) NOT DETECTED NOT DETECTED Final   Staphylococcus epidermidis NOT DETECTED NOT DETECTED Final   Staphylococcus lugdunensis NOT DETECTED NOT DETECTED Final   Streptococcus species NOT DETECTED NOT DETECTED Final   Streptococcus agalactiae NOT DETECTED NOT DETECTED Final   Streptococcus pneumoniae NOT DETECTED NOT DETECTED Final   Streptococcus pyogenes NOT DETECTED NOT DETECTED Final   A.calcoaceticus-baumannii NOT DETECTED NOT DETECTED Final   Bacteroides fragilis NOT DETECTED NOT DETECTED Final   Enterobacterales NOT DETECTED NOT DETECTED Final   Enterobacter cloacae complex NOT DETECTED NOT DETECTED Final   Escherichia coli NOT DETECTED NOT DETECTED Final   Klebsiella aerogenes NOT DETECTED NOT DETECTED Final   Klebsiella oxytoca NOT DETECTED NOT DETECTED Final   Klebsiella pneumoniae NOT DETECTED NOT DETECTED Final   Proteus species NOT DETECTED NOT DETECTED Final   Salmonella species NOT DETECTED NOT DETECTED Final   Serratia marcescens NOT DETECTED NOT DETECTED Final   Haemophilus influenzae NOT DETECTED NOT DETECTED Final   Neisseria meningitidis NOT DETECTED NOT DETECTED Final   Pseudomonas aeruginosa NOT DETECTED NOT DETECTED Final   Stenotrophomonas maltophilia NOT DETECTED NOT DETECTED Final   Candida albicans NOT DETECTED NOT DETECTED Final   Candida auris NOT DETECTED NOT DETECTED Final   Candida glabrata NOT DETECTED NOT DETECTED Final   Candida krusei NOT DETECTED NOT DETECTED Final   Candida parapsilosis NOT DETECTED NOT DETECTED Final   Candida tropicalis NOT DETECTED NOT DETECTED Final   Cryptococcus neoformans/gattii  NOT DETECTED NOT DETECTED Final    Comment: Performed at [facility], [address], [place], [place] [address]  Blood Culture (routine x 2)     Status: None   Collection Time: 08/26/20  9:09 PM   Specimen: BLOOD RIGHT ARM  Result Value Ref Range Status   Specimen Description BLOOD RIGHT ARM  Final   Special Requests   Final    BOTTLES DRAWN AEROBIC AND ANAEROBIC Blood Culture adequate volume   Culture   Final    NO GROWTH 5 DAYS Performed at [facility], [address], [place], [place] [address]    Report Status 08/31/2020 FINAL  Final         Radiology Studies: No results found.  Scheduled Meds: . vitamin C  500 mg Oral Daily  . atorvastatin  10 mg Oral Daily  . carvedilol  3.125 mg Oral BID  . cycloSPORINE  1 drop Both Eyes BID  . enoxaparin (LOVENOX) injection  40 mg Subcutaneous Q24H  . fluticasone furoate-vilanterol  1 puff Inhalation Daily  . furosemide  40 mg Oral Daily  . pantoprazole  40 mg Oral Daily  . predniSONE  40 mg Oral Q breakfast  . zinc sulfate  220 mg Oral Daily    LOS: 5 days    Time spent: 35 minutes    Dellis Voght Darleen Crocker, DO [facility]  If 7PM-7AM, please contact night-coverage [email or web address] 09/01/2020, 3:02 PM

## 2020-09-01 NOTE — Discharge Summary (Signed)
Physician Discharge Summary  Sonya Oliver RXV:[id number] DOB: Jan 12, 1941 DOA: 08/26/2020  PCP: Loman Brooklyn, FNP  Admit date: 08/26/2020  Discharge date: 09/01/2020  Admitted From:Home  Disposition:  Home  Recommendations for Outpatient Follow-up:  1. Follow up with PCP in 1-2 weeks 2. Please obtain BMP/CBC in one week 3. Continue steroids for several more days as prescribed 4. Continue other home medications as prior 5. Follow-up with pulmonology outpatient which will be scheduled for lung mass evaluation  Home Health:Yes with PT/RN  Equipment/Devices: Hospital bed, has home oxygen  Discharge Condition:Stable  CODE STATUS: Full  Diet recommendation: Dysphagia 3  Brief/Interim Summary: As per H&P written by Dr. Olevia Bowens on 08/26/2020 Sonya Alfred Websteris a 80 y.o.femalewith medical history significant ofosteoarthritis, bladder cancer, cataracts, diastolic CHF, COPD/emphysema, hypertension, history of hyperthyroidism, osteoporosis, history of CVA, type II DM, dementiawho was brought to the emergency department via EMS after family called due to the patient having decreased oral intake and history of multiple falls. She follows some simple commands, but does not answer questions.  ED Course:Initial vital signs were temperature99.9 F, pulse 94, respirations 24, BP 173/94 mmHg O2 sat 92% on room air.Her COVID-19 test was positive.  Labwork:Her urinalysis shows small hemoglobinuria, ketonuria of 80 and proteinuria 30 mg/dL. All other urinalysis values were normal. SARS 2 and influenza PCR was positive for COVID-19. CBC showed a white count of 4.2, hemoglobin 12.5 g/dL and platelets 114. Her D-dimer was 1.53 mcg/mL. Fibrinogen was 536 mg/dL. CMP shows sodium of 134 and chloride 97 mmol/L, total protein 8.7 g/dL and total bilirubin 1.4 mg/dL. The rest of the CMP values are unremarkable. Troponin was 17 and then 24 ng/L. BNP was 156.0 pg/mL. Lactic acid was normal.  Procalcitonin was less than 0.10 ng/mL. Ferritin was 125 ng/mL. CRP 3.4 mg/dL.  Imaging:CTA chest show a 3.1 cm rounded mass in the posterior left lower lobe. This is suspicious for neoplasm. There is small amount of patchy airspace opacity at the right lung base likely due to atelectasis or early infectious etiology. CT pelvis did not show any acute fracture or dislocation involving the pelvis. There was large volume of stool within the visualized bowel. No evidence of obstruction. Please see images and full radiologyreport for further detail.  -Patient was admitted with acute hypoxemic respiratory failure in the setting of COVID-19 pneumonia and was treated with remdesivir and steroids.  She is doing much better and is currently on 4 L nasal cannula oxygen typically wears 2 L at home.  She had been seen by physical therapy with recommendation for SNF on discharge and was awaiting placement, however it appears that there is a long wait list for placement for Covid positive patients and other facilities to consider would be quite far away.  Daughter has requested that she come home with home health instead as she would not want her mom to be too far and therefore this will be arranged at home along with hospital bed prior to discharge.  She has remained in stable condition in the last few days.  No other acute events noted throughout the course of this admission.  She will require pulmonology outpatient follow-up for lung mass that has been noted on imaging studies.  Discharge Diagnoses:  Principal Problem:   2019 novel coronavirus disease (COVID-19) Active Problems:   COPD (chronic obstructive pulmonary disease) (HCC)   HTN (hypertension)   Diabetes mellitus, type 2 (HCC)   Chronic diastolic CHF (congestive heart failure) ([place])   Mass  of lower lobe of left lung   Acute respiratory failure with hypoxia ([place])  Principal discharge diagnosis: Acute hypoxemic respiratory failure secondary to  COVID-19 pneumonia.  Discharge Instructions  Discharge Instructions    Ambulatory referral to Pulmonology   Complete by: As directed    Reason for referral: Lung Mass/Lung Nodule   Diet - low sodium heart healthy   Complete by: As directed    Increase activity slowly   Complete by: As directed      Allergies as of 09/01/2020   No Known Allergies     Medication List    TAKE these medications   albuterol (2.5 MG/3ML) 0.083% nebulizer solution Commonly known as: PROVENTIL Inhale 3 mLs into the lungs 4 (four) times daily as needed for shortness of breath. What changed: Another medication with the same name was changed. Make sure you understand how and when to take each.   albuterol 108 (90 Base) MCG/ACT inhaler Commonly known as: VENTOLIN HFA INHALE 1-2 PUFFS BY MOUTH EVERY 6 HOURS AS NEEDED FOR WHEEZE OR SHORTNESS OF BREATH What changed: See the new instructions.   alendronate 70 MG tablet Commonly known as: Fosamax Take 1 tablet (70 mg total) by mouth once a week. Take with a full glass of water on an empty stomach.   ascorbic acid 500 MG tablet Commonly known as: VITAMIN C Take 1 tablet (500 mg total) by mouth daily for 15 days. Start taking on: September 02, 2020   atorvastatin 10 MG tablet Commonly known as: LIPITOR TAKE 1 TABLET BY MOUTH EVERY DAY   carvedilol 3.125 MG tablet Commonly known as: COREG Take 1 tablet (3.125 mg total) by mouth 2 (two) times daily.   Fluticasone-Salmeterol 100-50 MCG/DOSE Aepb Commonly known as: Advair Diskus Inhale 1 puff into the lungs 2 (two) times daily.   furosemide 40 MG tablet Commonly known as: LASIX Take 1 tablet (40 mg total) by mouth daily.   guaiFENesin-dextromethorphan 100-10 MG/5ML syrup Commonly known as: ROBITUSSIN DM Take 10 mLs by mouth every 4 (four) hours as needed for cough.   lisinopril 40 MG tablet Commonly known as: ZESTRIL Take 1 tablet (40 mg total) by mouth daily.   metFORMIN 500 MG tablet Commonly  known as: GLUCOPHAGE Take 1 tablet (500 mg total) by mouth daily.   methimazole 5 MG tablet Commonly known as: TAPAZOLE Take 1 tablet (5 mg total) by mouth daily.   pantoprazole 40 MG tablet Commonly known as: PROTONIX Take 1 tablet (40 mg total) by mouth daily for 10 days. Start taking on: September 02, 2020   potassium chloride SA 20 MEQ tablet Commonly known as: KLOR-CON Take 1 tablet (20 mEq total) by mouth daily.   predniSONE 20 MG tablet Commonly known as: DELTASONE Take 2 tablets (40 mg total) by mouth daily with breakfast for 3 days. Start taking on: September 02, 2020   Restasis MultiDose 0.05 % ophthalmic emulsion Generic drug: cycloSPORINE Place 1 drop into both eyes 2 (two) times daily.   Vitamin D3 50 MCG (2000 UT) Tabs Take 1 tablet by mouth daily.   zinc sulfate 220 (50 Zn) MG capsule Take 1 capsule (220 mg total) by mouth daily for 15 days. Start taking on: September 02, 2020            Durable Medical Equipment  (From admission, onward)         Start     Ordered   09/01/20 1546  For home use only DME Hospital bed  Once       Question Answer Comment  Length of Need 12 Months   The above medical condition requires: Patient requires the ability to reposition frequently   Head must be elevated greater than: 30 degrees   Bed type Semi-electric      09/01/20 1545          Follow-up Information    Loman Brooklyn, FNP Follow up in 2 week(s).   Specialty: Family Medicine Contact information: [place] [address] [phone]              No Known Allergies  Consultations:  None   Procedures/Studies: DG Lumbar Spine Complete  Result Date: 08/15/2020 CLINICAL DATA:  Increased weakness and multiple falls, BILATERAL knee pain, pelvic pain, lower back pain, limited range of motion in knees EXAM: LUMBAR SPINE - COMPLETE 4+ VIEW COMPARISON:  None FINDINGS: Five non-rib-bearing lumbar vertebra. Multilevel disc space narrowing  and endplate spur formation. Multilevel facet degenerative changes. Mild anterolisthesis L4-L5 likely due to facet degenerative changes. No fracture, additional subluxation, or bone destruction. No spondylolysis. SI joints poorly visualized. Atherosclerotic calcifications abdominal aorta. 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 No acute abnormalities. Electronically Signed   By: Lavonia Dana M.D.   On: 08/15/2020 15:20   DG Pelvis 1-2 Views  Result Date: 08/15/2020 CLINICAL DATA:  Fall. Increasing weakness with multiple falls. EXAM: PELVIS - 1-2 VIEW COMPARISON:  Reformats from abdominopelvic CT 02/23/2012 FINDINGS: The cortical margins of the bony pelvis are intact. No fracture. Pubic symphysis and sacroiliac joints are congruent. Mild to moderate bilateral hip osteoarthritis. Both femoral heads are well-seated in the respective acetabula. There is a 2.1 cm lucent lesion in the right femoral neck with peripherally sclerotic margins. No prior exams available for comparison. IMPRESSION: 1. No fracture of the pelvis. 2. Mild to moderate bilateral hip osteoarthritis. 3. Peripherally sclerotic 2.1 cm lucent lesion in the right femoral neck. This was not seen on 2013 CT. Differential considerations include subchondral cyst or focal bone lesion. Recommend further evaluation with MRI. Electronically Signed   By: Keith Rake M.D.   On: 08/15/2020 15:00   CT Head Wo Contrast  Result Date: 08/15/2020 CLINICAL DATA:  Minor head trauma, multiple falls, diabetes mellitus, hypertension, history bladder cancer EXAM: CT HEAD WITHOUT CONTRAST TECHNIQUE: Contiguous axial images were obtained from the base of the skull through the vertex without intravenous contrast. Sagittal and coronal MPR images reconstructed from axial data set. COMPARISON:  03/21/2012 FINDINGS: Brain: Generalized atrophy. Normal ventricular morphology. No midline shift or mass  effect. Small vessel chronic ischemic changes of deep cerebral white matter. No intracranial hemorrhage, mass lesion, evidence of acute infarction, or extra-axial fluid collection. Vascular: Atherosclerotic calcifications of internal carotid arteries at skull base. Skull: Calvaria intact Sinuses/Orbits: Clear Other: N/A IMPRESSION: Atrophy with small vessel chronic ischemic changes of deep cerebral white matter. No acute intracranial abnormalities. Electronically Signed   By: Lavonia Dana M.D.   On: 08/15/2020 15:21   CT Angio Chest PE W/Cm &/Or Wo Cm  Result Date: 08/27/2020 CLINICAL DATA:  Shortness of breath EXAM: CT ANGIOGRAPHY CHEST WITH CONTRAST TECHNIQUE: Multidetector CT imaging of the chest was performed using the standard protocol during bolus administration of intravenous contrast. Multiplanar CT image reconstructions and MIPs were obtained to evaluate the vascular anatomy. CONTRAST:  18mL OMNIPAQUE IOHEXOL 350 MG/ML SOLN COMPARISON:  None. FINDINGS: Cardiovascular: There is a optimal opacification of the  pulmonary arteries. There is no central,segmental, or subsegmental filling defects within the pulmonary arteries. The heart is normal in size. No pericardial effusion or thickening. No evidence right heart strain. There is normal three-vessel brachiocephalic anatomy without proximal stenosis. The thoracic aorta is normal in appearance. Mediastinum/Nodes: No hilar, mediastinal, or axillary adenopathy. Thyroid gland, trachea, and esophagus demonstrate no significant findings. Lungs/Pleura: A rounded 3.1 cm mass is seen within the posterior left lower lobe. Centrilobular emphysematous changes are seen. A small amount of patchy airspace opacity seen at the right lung base. Upper Abdomen: No acute abnormalities present in the visualized portions of the upper abdomen. Musculoskeletal: No chest wall abnormality. No acute or significant osseous findings. Review of the MIP images confirms the above findings.  IMPRESSION: No central, segmental, or subsegmental pulmonary embolism. 3.1 cm rounded mass in the posterior left lower lobe. This could represent a primary pulmonary neoplasm. Small amount of patchy airspace opacity at the right lung base, likely due to atelectasis and/or early infectious etiology. Emphysema (ICD10-J43.9). Electronically Signed   By: Prudencio Pair M.D.   On: 08/27/2020 03:03   CT PELVIS WO CONTRAST  Result Date: 08/27/2020 CLINICAL DATA:  Multiple falls, right hip pain EXAM: CT PELVIS WITHOUT CONTRAST TECHNIQUE: Multidetector CT imaging of the pelvis was performed following the standard protocol without intravenous contrast. COMPARISON:  None. FINDINGS: Urinary Tract:  No abnormality visualized. Bowel: There is large volume stool seen within the visualized large bowel. No evidence of obstruction or focal inflammation. No free fluid within the pelvis. Vascular/Lymphatic: Moderate aortoiliac atherosclerotic calcification. No aortic aneurysm. No pathologic adenopathy within the abdomen and pelvis. Reproductive:  Uterus absent.  No adnexal masses. Other:  Rectum unremarkable.  No abdominal wall hernia identified. Musculoskeletal: The osseous structures are intact. No evidence of fracture or dislocation. Degenerative changes are seen within the lumbar spine. There is degenerative enthesopathy involving the insertion of the hamstrings bilaterally. IMPRESSION: No acute fracture or dislocation involving the pelvis. Large volume stool within the a visualized bowel within the pelvis. No evidence of obstruction. Aortic Atherosclerosis (ICD10-I70.0). Electronically Signed   By: Fidela Salisbury MD   On: 08/27/2020 02:56   DG Chest Port 1 View  Result Date: 08/26/2020 CLINICAL DATA:  Shortness of breath EXAM: PORTABLE CHEST 1 VIEW COMPARISON:  12/22/2016 FINDINGS: Hyperinflation with emphysematous disease. No consolidation or effusion. Mild cardiomegaly with aortic atherosclerosis. No pneumothorax.  IMPRESSION: No active disease. Hyperinflation with emphysematous disease. Mild cardiomegaly. Electronically Signed   By: Donavan Foil M.D.   On: 08/26/2020 21:29   DG Knee Complete 4 Views Left  Result Date: 08/15/2020 CLINICAL DATA:  Fall. Bilateral knee pain, increasing weakness with multiple falls. Limited range of motion. EXAM: LEFT KNEE - COMPLETE 4+ VIEW COMPARISON:  Left knee radiograph 11/22/2018 FINDINGS: No acute or healing fracture. Tricompartmental osteoarthritis most prominent in the medial tibiofemoral compartment with prominent joint space loss and peripheral spurring. No significant joint effusion. Small quadriceps and patellar tendon enthesophytes. Mild soft tissue edema. There are vascular calcifications. IMPRESSION: 1. No acute or healing fracture of the left knee. 2. Tricompartmental osteoarthritis, most prominent in the medial tibiofemoral compartment. Degenerative changes are similar to prior exam. Electronically Signed   By: Keith Rake M.D.   On: 08/15/2020 14:55   DG Knee Complete 4 Views Right  Result Date: 08/15/2020 CLINICAL DATA:  Fall. Bilateral knee pain. Increasing weakness and multiple falls. EXAM: RIGHT KNEE - COMPLETE 4+ VIEW COMPARISON:  None. FINDINGS: No fracture or dislocation. Moderate tricompartmental  osteoarthritis with peripheral spurring. Quadriceps tendon enthesophyte. There is a small knee joint effusion. Lateral chondrocalcinosis. Mild soft tissue edema. There are vascular calcifications. IMPRESSION: 1. No fracture or dislocation of the right knee. 2. Moderate tricompartmental osteoarthritis with small joint effusion. 3. Lateral chondrocalcinosis. Electronically Signed   By: Keith Rake M.D.   On: 08/15/2020 14:56   DG Knee Left Port  Result Date: 08/27/2020 CLINICAL DATA:  Multiple falls EXAM: PORTABLE LEFT KNEE - 1-2 VIEW COMPARISON:  08/15/2020 FINDINGS: Negative for fracture.  Normal alignment. Moderate degenerative change in the medial  joint compartment with joint space narrowing and spurring. Mild degenerative change laterally and in the patella. Small joint effusion. Atherosclerotic calcification. IMPRESSION: Negative for fracture. Electronically Signed   By: Franchot Gallo M.D.   On: 08/27/2020 08:08      Discharge Exam: Vitals:   09/01/20 0822 09/01/20 1422  BP:  (!) 150/67  Pulse:  71  Resp:  20  Temp:  97.6 F (36.4 C)  SpO2: 92%    Vitals:   08/31/20 2100 09/01/20 0537 09/01/20 0822 09/01/20 1422  BP: (!) 179/92 (!) 159/69  (!) 150/67  Pulse: 88 80  71  Resp: 18   20  Temp: 98 F (36.7 C) 98.3 F (36.8 C)  97.6 F (36.4 C)  TempSrc: Oral Oral  Oral  SpO2:  (!) 80% 92%   Weight:      Height:        General: Pt is alert, awake, not in acute distress Cardiovascular: RRR, S1/S2 +, no rubs, no gallops Respiratory: CTA bilaterally, no wheezing, no rhonchi, on 4 L nasal cannula oxygen Abdominal: Soft, NT, ND, bowel sounds + Extremities: no edema, no cyanosis    The results of significant diagnostics from this hospitalization (including imaging, microbiology, ancillary and laboratory) are listed below for reference.     Microbiology: Recent Results (from the past 240 hour(s))  Resp Panel by RT-PCR (Flu A&B, Covid) Nasopharyngeal Swab     Status: Abnormal   Collection Time: 08/26/20  8:44 PM   Specimen: Nasopharyngeal Swab; Nasopharyngeal(NP) swabs in vial transport medium  Result Value Ref Range Status   SARS Coronavirus 2 by RT PCR POSITIVE (A) NEGATIVE Final    Comment: RESULT CALLED TO, READ BACK BY AND VERIFIED WITH: BRAME,M @ 2156 ON 08/26/20 BY JUW (NOTE) SARS-CoV-2 target nucleic acids are DETECTED.  The SARS-CoV-2 RNA is generally detectable in upper respiratory specimens during the acute phase of infection. Positive results are indicative of the presence of the identified virus, but do not rule out bacterial infection or co-infection with other pathogens not detected by the test.  Clinical correlation with patient history and other diagnostic information is necessary to determine patient infection status. The expected result is Negative.  Fact Sheet for Patients: EntrepreneurPulse.com.au  Fact Sheet for Healthcare Providers: IncredibleEmployment.be  This test is not yet approved or cleared by the [place] FDA and  has been authorized for detection and/or diagnosis of SARS-CoV-2 by FDA under an Emergency Use Authorization (EUA).  This EUA will remain in effect (meaning this test can be  used) for the duration of  the COVID-19 declaration under Section 564(b)(1) of the Act, 21 U.S.C. section 360bbb-3(b)(1), unless the authorization is terminated or revoked sooner.     Influenza A by PCR NEGATIVE NEGATIVE Final   Influenza B by PCR NEGATIVE NEGATIVE Final    Comment: (NOTE) The Xpert Xpress SARS-CoV-2/FLU/RSV plus assay is intended as an aid in the diagnosis  of influenza from Nasopharyngeal swab specimens and should not be used as a sole basis for treatment. Nasal washings and aspirates are unacceptable for Xpert Xpress SARS-CoV-2/FLU/RSV testing.  Fact Sheet for Patients: EntrepreneurPulse.com.au  Fact Sheet for Healthcare Providers: IncredibleEmployment.be  This test is not yet approved or cleared by the [place] FDA and has been authorized for detection and/or diagnosis of SARS-CoV-2 by FDA under an Emergency Use Authorization (EUA). This EUA will remain in effect (meaning this test can be used) for the duration of the COVID-19 declaration under Section 564(b)(1) of the Act, 21 U.S.C. section 360bbb-3(b)(1), unless the authorization is terminated or revoked.  Performed at [facility], [address], [place], [place] [address]   Blood Culture (routine x 2)     Status: Abnormal   Collection Time: 08/26/20  8:45 PM   Specimen: BLOOD LEFT ARM  Result Value Ref Range  Status   Specimen Description   Final    BLOOD LEFT ARM Performed at [facility], [address], [place], [place] [address]    Special Requests   Final    BOTTLES DRAWN AEROBIC AND ANAEROBIC Blood Culture adequate volume Performed at [facility], [address] [place], [place] [address]    Culture  Setup Time   Final    AEROBIC BOTTLE ONLY GRAM POSITIVE COCCI Gram Stain Report Called to,Read Back By and Verified With: M BRAME,RN@0210  08/28/20 MKELLY CRITICAL RESULT CALLED TO, READ BACK BY AND VERIFIED WITH: Vira Browns RN 08/28/20 0426 JDW    Culture (A)  Final    STAPHYLOCOCCUS HOMINIS THE SIGNIFICANCE OF ISOLATING THIS ORGANISM FROM A SINGLE SET OF BLOOD CULTURES WHEN MULTIPLE SETS ARE DRAWN IS UNCERTAIN. PLEASE NOTIFY THE MICROBIOLOGY DEPARTMENT WITHIN ONE WEEK IF SPECIATION AND SENSITIVITIES ARE REQUIRED. Performed at [facility], [place] [address] [place], [place] [address]    Report Status 08/29/2020 FINAL  Final  Blood Culture ID Panel (Reflexed)     Status: Abnormal   Collection Time: 08/26/20  8:45 PM  Result Value Ref Range Status   Enterococcus faecalis NOT DETECTED NOT DETECTED Final   Enterococcus Faecium NOT DETECTED NOT DETECTED Final   Listeria monocytogenes NOT DETECTED NOT DETECTED Final   Staphylococcus species DETECTED (A) NOT DETECTED Final    Comment: CRITICAL RESULT CALLED TO, READ BACK BY AND VERIFIED WITH: Vira Browns RN 08/28/20 0426 JDW    Staphylococcus aureus (BCID) NOT DETECTED NOT DETECTED Final   Staphylococcus epidermidis NOT DETECTED NOT DETECTED Final   Staphylococcus lugdunensis NOT DETECTED NOT DETECTED Final   Streptococcus species NOT DETECTED NOT DETECTED Final   Streptococcus agalactiae NOT DETECTED NOT DETECTED Final   Streptococcus pneumoniae NOT DETECTED NOT DETECTED Final   Streptococcus pyogenes NOT DETECTED NOT DETECTED Final   A.calcoaceticus-baumannii NOT DETECTED NOT DETECTED Final   Bacteroides fragilis NOT DETECTED NOT  DETECTED Final   Enterobacterales NOT DETECTED NOT DETECTED Final   Enterobacter cloacae complex NOT DETECTED NOT DETECTED Final   Escherichia coli NOT DETECTED NOT DETECTED Final   Klebsiella aerogenes NOT DETECTED NOT DETECTED Final   Klebsiella oxytoca NOT DETECTED NOT DETECTED Final   Klebsiella pneumoniae NOT DETECTED NOT DETECTED Final   Proteus species NOT DETECTED NOT DETECTED Final   Salmonella species NOT DETECTED NOT DETECTED Final   Serratia marcescens NOT DETECTED NOT DETECTED Final   Haemophilus influenzae NOT DETECTED NOT DETECTED Final   Neisseria meningitidis NOT DETECTED NOT DETECTED Final   Pseudomonas aeruginosa NOT DETECTED NOT DETECTED Final  Stenotrophomonas maltophilia NOT DETECTED NOT DETECTED Final   Candida albicans NOT DETECTED NOT DETECTED Final   Candida auris NOT DETECTED NOT DETECTED Final   Candida glabrata NOT DETECTED NOT DETECTED Final   Candida krusei NOT DETECTED NOT DETECTED Final   Candida parapsilosis NOT DETECTED NOT DETECTED Final   Candida tropicalis NOT DETECTED NOT DETECTED Final   Cryptococcus neoformans/gattii NOT DETECTED NOT DETECTED Final    Comment: Performed at [facility], [place] [address] [place], [place] [address]  Blood Culture (routine x 2)     Status: None   Collection Time: 08/26/20  9:09 PM   Specimen: BLOOD RIGHT ARM  Result Value Ref Range Status   Specimen Description BLOOD RIGHT ARM  Final   Special Requests   Final    BOTTLES DRAWN AEROBIC AND ANAEROBIC Blood Culture adequate volume   Culture   Final    NO GROWTH 5 DAYS Performed at [facility], [address], [place], [place] [address]    Report Status 08/31/2020 FINAL  Final     Labs: BNP (last 3 results) Recent Labs    08/26/20 2045  BNP 883.2*   Basic Metabolic Panel: Recent Labs  Lab 08/26/20 2045 08/27/20 0439 08/28/20 0601 08/29/20 0954 08/30/20 0500 08/31/20 0757  NA 134* 134* 138 138 138 138  K 3.7 3.8 4.2 4.2 3.7 3.9  CL 97* 98  96* 98 95* 95*  CO2 26 27 31 31  33* 36*  GLUCOSE 71 80 121* 111* 168* 160*  BUN 12 10 13  25* 26* 27*  CREATININE 0.71 0.69 0.66 0.85 0.82 0.83  CALCIUM 9.0 8.4* 8.9 8.7* 8.6* 8.5*  MG 1.8  --   --   --   --   --   PHOS 2.7  --   --   --   --   --    Liver Function Tests: Recent Labs  Lab 08/27/20 0439 08/28/20 0601 08/29/20 0954 08/30/20 0500 08/31/20 0757  AST 17 26 35 30 22  ALT 10 15 17 17 16   ALKPHOS 50 49 45 47 42  BILITOT 1.0 0.8 0.9 0.7 0.6  PROT 7.7 8.1 7.7 8.1 7.8  ALBUMIN 3.3* 3.3* 3.2* 3.3* 3.1*   No results for input(s): LIPASE, AMYLASE in the last 168 hours. No results for input(s): AMMONIA in the last 168 hours. CBC: Recent Labs  Lab 08/27/20 0439 08/28/20 0601 08/29/20 0954 08/30/20 0500 08/31/20 0757  WBC 4.0 2.4* 4.8 4.1 3.8*  NEUTROABS 2.8 1.7 3.4 3.1 2.6  HGB 11.8* 13.3 13.1 13.9 13.8  HCT 39.5 44.9 42.6 46.6* 46.3*  MCV 86.6 87.5 86.6 85.7 87.9  PLT 104* 110* 153 174 173   Cardiac Enzymes: No results for input(s): CKTOTAL, CKMB, CKMBINDEX, TROPONINI in the last 168 hours. BNP: Invalid input(s): POCBNP CBG: No results for input(s): GLUCAP in the last 168 hours. D-Dimer Recent Labs    08/30/20 0500 08/31/20 0757  DDIMER 2.93* 3.12*   Hgb A1c No results for input(s): HGBA1C in the last 72 hours. Lipid Profile No results for input(s): CHOL, HDL, LDLCALC, TRIG, CHOLHDL, LDLDIRECT in the last 72 hours. Thyroid function studies No results for input(s): TSH, T4TOTAL, T3FREE, THYROIDAB in the last 72 hours.  Invalid input(s): FREET3 Anemia work up Recent Labs    08/30/20 0500 08/31/20 0757  FERRITIN 177 154   Urinalysis    Component Value Date/Time   COLORURINE YELLOW 08/26/2020 [place] 08/26/2020 2247   APPEARANCEUR Clear  02/20/2019 1058   LABSPEC 1.017 08/26/2020 2247   PHURINE 6.0 08/26/2020 [place] 08/26/2020 2247   HGBUR SMALL (A) 08/26/2020 2247   BILIRUBINUR NEGATIVE 08/26/2020 2247    BILIRUBINUR Negative 02/20/2019 1058   KETONESUR 80 (A) 08/26/2020 2247   PROTEINUR 30 (A) 08/26/2020 2247   UROBILINOGEN 1.0 03/21/2012 1936   NITRITE NEGATIVE 08/26/2020 2247   LEUKOCYTESUR NEGATIVE 08/26/2020 2247   Sepsis Labs Invalid input(s): PROCALCITONIN,  WBC,  LACTICIDVEN Microbiology Recent Results (from the past 240 hour(s))  Resp Panel by RT-PCR (Flu A&B, Covid) Nasopharyngeal Swab     Status: Abnormal   Collection Time: 08/26/20  8:44 PM   Specimen: Nasopharyngeal Swab; Nasopharyngeal(NP) swabs in vial transport medium  Result Value Ref Range Status   SARS Coronavirus 2 by RT PCR POSITIVE (A) NEGATIVE Final    Comment: RESULT CALLED TO, READ BACK BY AND VERIFIED WITH: BRAME,M @ 2156 ON 08/26/20 BY JUW (NOTE) SARS-CoV-2 target nucleic acids are DETECTED.  The SARS-CoV-2 RNA is generally detectable in upper respiratory specimens during the acute phase of infection. Positive results are indicative of the presence of the identified virus, but do not rule out bacterial infection or co-infection with other pathogens not detected by the test. Clinical correlation with patient history and other diagnostic information is necessary to determine patient infection status. The expected result is Negative.  Fact Sheet for Patients: EntrepreneurPulse.com.au  Fact Sheet for Healthcare Providers: IncredibleEmployment.be  This test is not yet approved or cleared by the [place] FDA and  has been authorized for detection and/or diagnosis of SARS-CoV-2 by FDA under an Emergency Use Authorization (EUA).  This EUA will remain in effect (meaning this test can be  used) for the duration of  the COVID-19 declaration under Section 564(b)(1) of the Act, 21 U.S.C. section 360bbb-3(b)(1), unless the authorization is terminated or revoked sooner.     Influenza A by PCR NEGATIVE NEGATIVE Final   Influenza B by PCR NEGATIVE NEGATIVE Final     Comment: (NOTE) The Xpert Xpress SARS-CoV-2/FLU/RSV plus assay is intended as an aid in the diagnosis of influenza from Nasopharyngeal swab specimens and should not be used as a sole basis for treatment. Nasal washings and aspirates are unacceptable for Xpert Xpress SARS-CoV-2/FLU/RSV testing.  Fact Sheet for Patients: EntrepreneurPulse.com.au  Fact Sheet for Healthcare Providers: IncredibleEmployment.be  This test is not yet approved or cleared by the [place] FDA and has been authorized for detection and/or diagnosis of SARS-CoV-2 by FDA under an Emergency Use Authorization (EUA). This EUA will remain in effect (meaning this test can be used) for the duration of the COVID-19 declaration under Section 564(b)(1) of the Act, 21 U.S.C. section 360bbb-3(b)(1), unless the authorization is terminated or revoked.  Performed at [facility], [address] [place], [place] [address]   Blood Culture (routine x 2)     Status: Abnormal   Collection Time: 08/26/20  8:45 PM   Specimen: BLOOD LEFT ARM  Result Value Ref Range Status   Specimen Description   Final    BLOOD LEFT ARM Performed at [facility], [address], [place], [place] [address]    Special Requests   Final    BOTTLES DRAWN AEROBIC AND ANAEROBIC Blood Culture adequate volume Performed at [facility], [address] [place], [place] [address]    Culture  Setup Time   Final    AEROBIC BOTTLE ONLY GRAM POSITIVE COCCI Gram Stain Report Called to,Read Back By and Verified  With: M BRAME,RN@0210  08/28/20 MKELLY CRITICAL RESULT CALLED TO, READ BACK BY AND VERIFIED WITH: Vira Browns RN 08/28/20 0426 JDW    Culture (A)  Final    STAPHYLOCOCCUS HOMINIS THE SIGNIFICANCE OF ISOLATING THIS ORGANISM FROM A SINGLE SET OF BLOOD CULTURES WHEN MULTIPLE SETS ARE DRAWN IS UNCERTAIN. PLEASE NOTIFY THE MICROBIOLOGY DEPARTMENT WITHIN ONE WEEK IF SPECIATION AND SENSITIVITIES ARE REQUIRED. Performed at  [facility], [place] [address] [place], [place] [address]    Report Status 08/29/2020 FINAL  Final  Blood Culture ID Panel (Reflexed)     Status: Abnormal   Collection Time: 08/26/20  8:45 PM  Result Value Ref Range Status   Enterococcus faecalis NOT DETECTED NOT DETECTED Final   Enterococcus Faecium NOT DETECTED NOT DETECTED Final   Listeria monocytogenes NOT DETECTED NOT DETECTED Final   Staphylococcus species DETECTED (A) NOT DETECTED Final    Comment: CRITICAL RESULT CALLED TO, READ BACK BY AND VERIFIED WITH: Vira Browns RN 08/28/20 0426 JDW    Staphylococcus aureus (BCID) NOT DETECTED NOT DETECTED Final   Staphylococcus epidermidis NOT DETECTED NOT DETECTED Final   Staphylococcus lugdunensis NOT DETECTED NOT DETECTED Final   Streptococcus species NOT DETECTED NOT DETECTED Final   Streptococcus agalactiae NOT DETECTED NOT DETECTED Final   Streptococcus pneumoniae NOT DETECTED NOT DETECTED Final   Streptococcus pyogenes NOT DETECTED NOT DETECTED Final   A.calcoaceticus-baumannii NOT DETECTED NOT DETECTED Final   Bacteroides fragilis NOT DETECTED NOT DETECTED Final   Enterobacterales NOT DETECTED NOT DETECTED Final   Enterobacter cloacae complex NOT DETECTED NOT DETECTED Final   Escherichia coli NOT DETECTED NOT DETECTED Final   Klebsiella aerogenes NOT DETECTED NOT DETECTED Final   Klebsiella oxytoca NOT DETECTED NOT DETECTED Final   Klebsiella pneumoniae NOT DETECTED NOT DETECTED Final   Proteus species NOT DETECTED NOT DETECTED Final   Salmonella species NOT DETECTED NOT DETECTED Final   Serratia marcescens NOT DETECTED NOT DETECTED Final   Haemophilus influenzae NOT DETECTED NOT DETECTED Final   Neisseria meningitidis NOT DETECTED NOT DETECTED Final   Pseudomonas aeruginosa NOT DETECTED NOT DETECTED Final   Stenotrophomonas maltophilia NOT DETECTED NOT DETECTED Final   Candida albicans NOT DETECTED NOT DETECTED Final   Candida auris NOT DETECTED NOT DETECTED Final    Candida glabrata NOT DETECTED NOT DETECTED Final   Candida krusei NOT DETECTED NOT DETECTED Final   Candida parapsilosis NOT DETECTED NOT DETECTED Final   Candida tropicalis NOT DETECTED NOT DETECTED Final   Cryptococcus neoformans/gattii NOT DETECTED NOT DETECTED Final    Comment: Performed at [facility], [address], [place], [place] [address]  Blood Culture (routine x 2)     Status: None   Collection Time: 08/26/20  9:09 PM   Specimen: BLOOD RIGHT ARM  Result Value Ref Range Status   Specimen Description BLOOD RIGHT ARM  Final   Special Requests   Final    BOTTLES DRAWN AEROBIC AND ANAEROBIC Blood Culture adequate volume   Culture   Final    NO GROWTH 5 DAYS Performed at [facility], [address], [place], [place] [address]    Report Status 08/31/2020 FINAL  Final     Time coordinating discharge: 35 minutes  SIGNED:   Rodena Goldmann, DO [facility] 09/01/2020, 3:52 PM  If 7PM-7AM, please contact night-coverage [email or web address]

## 2020-09-01 NOTE — Plan of Care (Signed)

## 2020-09-01 NOTE — Plan of Care (Signed)
Problem: Education: Goal: Knowledge of General Education information will improve Description: Including pain rating scale, medication(s)/side effects and non-pharmacologic comfort measures 09/01/2020 1618 by Adam Phenix, RN Outcome: Adequate for Discharge 09/01/2020 1618 by Adam Phenix, RN Outcome: Progressing 09/01/2020 1215 by Adam Phenix, RN Outcome: Progressing   Problem: Health Behavior/Discharge Planning: Goal: Ability to manage health-related needs will improve 09/01/2020 1618 by Adam Phenix, RN Outcome: Adequate for Discharge 09/01/2020 1618 by Adam Phenix, RN Outcome: Progressing 09/01/2020 1215 by Adam Phenix, RN Outcome: Progressing   Problem: Clinical Measurements: Goal: Ability to maintain clinical measurements within normal limits will improve 09/01/2020 1618 by Adam Phenix, RN Outcome: Adequate for Discharge 09/01/2020 1618 by Adam Phenix, RN Outcome: Progressing 09/01/2020 1215 by Adam Phenix, RN Outcome: Progressing Goal: Will remain free from infection 09/01/2020 1618 by Adam Phenix, RN Outcome: Adequate for Discharge 09/01/2020 1618 by Adam Phenix, RN Outcome: Progressing 09/01/2020 1215 by Adam Phenix, RN Outcome: Progressing Goal: Diagnostic test results will improve 09/01/2020 1618 by Adam Phenix, RN Outcome: Adequate for Discharge 09/01/2020 1618 by Adam Phenix, RN Outcome: Progressing 09/01/2020 1215 by Adam Phenix, RN Outcome: Progressing Goal: Respiratory complications will improve 09/01/2020 1618 by Adam Phenix, RN Outcome: Adequate for Discharge 09/01/2020 1618 by Adam Phenix, RN Outcome: Progressing 09/01/2020 1215 by Adam Phenix, RN Outcome: Progressing Goal: Cardiovascular complication will be avoided 09/01/2020 1618 by Adam Phenix, RN Outcome: Adequate for Discharge 09/01/2020 1618 by Adam Phenix, RN Outcome: Progressing 09/01/2020 1215 by Adam Phenix, RN Outcome: Progressing   Problem: Activity: Goal: Risk for activity intolerance will decrease 09/01/2020 1618 by Adam Phenix, RN Outcome: Adequate for Discharge 09/01/2020 1618 by Adam Phenix, RN Outcome: Progressing 09/01/2020 1215 by Adam Phenix, RN Outcome: Progressing   Problem: Nutrition: Goal: Adequate nutrition will be maintained 09/01/2020 1618 by Adam Phenix, RN Outcome: Adequate for Discharge 09/01/2020 1618 by Adam Phenix, RN Outcome: Progressing 09/01/2020 1215 by Adam Phenix, RN Outcome: Progressing   Problem: Coping: Goal: Level of anxiety will decrease 09/01/2020 1618 by Adam Phenix, RN Outcome: Adequate for Discharge 09/01/2020 1618 by Adam Phenix, RN Outcome: Progressing 09/01/2020 1215 by Adam Phenix, RN Outcome: Progressing   Problem: Elimination: Goal: Will not experience complications related to bowel motility 09/01/2020 1618 by Adam Phenix, RN Outcome: Adequate for Discharge 09/01/2020 1618 by Adam Phenix, RN Outcome: Progressing 09/01/2020 1215 by Adam Phenix, RN Outcome: Progressing Goal: Will not experience complications related to urinary retention 09/01/2020 1618 by Adam Phenix, RN Outcome: Adequate for Discharge 09/01/2020 1618 by Adam Phenix, RN Outcome: Progressing 09/01/2020 1215 by Adam Phenix, RN Outcome: Progressing   Problem: Pain Managment: Goal: General experience of comfort will improve 09/01/2020 1618 by Adam Phenix, RN Outcome: Adequate for Discharge 09/01/2020 1618 by Adam Phenix, RN Outcome: Progressing 09/01/2020 1215 by Adam Phenix, RN Outcome: Progressing   Problem: Safety: Goal: Ability to remain free from injury will improve 09/01/2020 1618 by Adam Phenix, RN Outcome: Adequate for Discharge 09/01/2020 1618 by  Adam Phenix, RN Outcome: Progressing 09/01/2020 1215 by Adam Phenix, RN Outcome: Progressing   Problem: Skin Integrity: Goal: Risk for impaired skin integrity will decrease 09/01/2020 1618 by Adam Phenix, RN Outcome: Adequate for Discharge 09/01/2020 1618 by Adam Phenix, RN Outcome: Progressing 09/01/2020 1215 by Adam Phenix, RN  Outcome: Progressing   Problem: Education: Goal: Knowledge of risk factors and measures for prevention of condition will improve 09/01/2020 1618 by Adam Phenix, RN Outcome: Adequate for Discharge 09/01/2020 1618 by Adam Phenix, RN Outcome: Progressing 09/01/2020 1215 by Adam Phenix, RN Outcome: Progressing   Problem: Coping: Goal: Psychosocial and spiritual needs will be supported 09/01/2020 1618 by Adam Phenix, RN Outcome: Adequate for Discharge 09/01/2020 1618 by Adam Phenix, RN Outcome: Progressing 09/01/2020 1215 by Adam Phenix, RN Outcome: Progressing   Problem: Respiratory: Goal: Will maintain a patent airway 09/01/2020 1618 by Adam Phenix, RN Outcome: Adequate for Discharge 09/01/2020 1618 by Adam Phenix, RN Outcome: Progressing 09/01/2020 1215 by Adam Phenix, RN Outcome: Progressing Goal: Complications related to the disease process, condition or treatment will be avoided or minimized 09/01/2020 1618 by Adam Phenix, RN Outcome: Adequate for Discharge 09/01/2020 1618 by Adam Phenix, RN Outcome: Progressing 09/01/2020 1215 by Adam Phenix, RN Outcome: Progressing   Problem: Acute Rehab PT Goals(only PT should resolve) Goal: Pt Will Go Supine/Side To Sit Outcome: Adequate for Discharge Goal: Patient Will Transfer Sit To/From Stand Outcome: Adequate for Discharge Goal: Pt Will Transfer Bed To Chair/Chair To Bed Outcome: Adequate for Discharge Goal: Pt Will Ambulate Outcome: Adequate for Discharge

## 2020-09-03 ENCOUNTER — Encounter (HOSPITAL_COMMUNITY): Payer: Self-pay

## 2020-09-03 ENCOUNTER — Other Ambulatory Visit: Payer: Self-pay

## 2020-09-03 ENCOUNTER — Emergency Department (HOSPITAL_COMMUNITY)
Admission: EM | Admit: 2020-09-03 | Discharge: 2020-09-04 | Disposition: A | Discharge status: Home / Self Care | Payer: Medicare Other | Attending: Emergency Medicine | Admitting: Emergency Medicine

## 2020-09-03 DIAGNOSIS — R531 Weakness: Secondary | ICD-10-CM | POA: Insufficient documentation

## 2020-09-03 DIAGNOSIS — Z7984 Long term (current) use of oral hypoglycemic drugs: Secondary | ICD-10-CM | POA: Insufficient documentation

## 2020-09-03 DIAGNOSIS — Z8551 Personal history of malignant neoplasm of bladder: Secondary | ICD-10-CM | POA: Insufficient documentation

## 2020-09-03 DIAGNOSIS — E039 Hypothyroidism, unspecified: Secondary | ICD-10-CM | POA: Insufficient documentation

## 2020-09-03 DIAGNOSIS — J449 Chronic obstructive pulmonary disease, unspecified: Secondary | ICD-10-CM | POA: Diagnosis not present

## 2020-09-03 DIAGNOSIS — R112 Nausea with vomiting, unspecified: Secondary | ICD-10-CM | POA: Diagnosis not present

## 2020-09-03 DIAGNOSIS — Z87891 Personal history of nicotine dependence: Secondary | ICD-10-CM | POA: Insufficient documentation

## 2020-09-03 DIAGNOSIS — I5032 Chronic diastolic (congestive) heart failure: Secondary | ICD-10-CM | POA: Diagnosis not present

## 2020-09-03 DIAGNOSIS — Z79899 Other long term (current) drug therapy: Secondary | ICD-10-CM | POA: Diagnosis not present

## 2020-09-03 DIAGNOSIS — R109 Unspecified abdominal pain: Secondary | ICD-10-CM | POA: Insufficient documentation

## 2020-09-03 DIAGNOSIS — E119 Type 2 diabetes mellitus without complications: Secondary | ICD-10-CM | POA: Diagnosis not present

## 2020-09-03 DIAGNOSIS — R55 Syncope and collapse: Secondary | ICD-10-CM | POA: Insufficient documentation

## 2020-09-03 DIAGNOSIS — Z955 Presence of coronary angioplasty implant and graft: Secondary | ICD-10-CM | POA: Insufficient documentation

## 2020-09-03 DIAGNOSIS — I110 Hypertensive heart disease with heart failure: Secondary | ICD-10-CM | POA: Diagnosis not present

## 2020-09-03 LAB — COMPREHENSIVE METABOLIC PANEL WITH GFR
ALT: 17 U/L (ref 0–44)
AST: 20 U/L (ref 15–41)
Albumin: 3.4 g/dL — ABNORMAL LOW (ref 3.5–5.0)
Alkaline Phosphatase: 50 U/L (ref 38–126)
Anion gap: 12 (ref 5–15)
BUN: 37 mg/dL — ABNORMAL HIGH (ref 8–23)
CO2: 32 mmol/L (ref 22–32)
Calcium: 9.3 mg/dL (ref 8.9–10.3)
Chloride: 92 mmol/L — ABNORMAL LOW (ref 98–111)
Creatinine, Ser: 0.98 mg/dL (ref 0.44–1.00)
GFR, Estimated: 58 mL/min — ABNORMAL LOW
Glucose, Bld: 168 mg/dL — ABNORMAL HIGH (ref 70–99)
Potassium: 3.5 mmol/L (ref 3.5–5.1)
Sodium: 136 mmol/L (ref 135–145)
Total Bilirubin: 1.5 mg/dL — ABNORMAL HIGH (ref 0.3–1.2)
Total Protein: 8.3 g/dL — ABNORMAL HIGH (ref 6.5–8.1)

## 2020-09-03 LAB — CBC
HCT: 47.2 % — ABNORMAL HIGH (ref 36.0–46.0)
Hemoglobin: 14.1 g/dL (ref 12.0–15.0)
MCH: 25.8 pg — ABNORMAL LOW (ref 26.0–34.0)
MCHC: 29.9 g/dL — ABNORMAL LOW (ref 30.0–36.0)
MCV: 86.4 fL (ref 80.0–100.0)
Platelets: 215 10*3/uL (ref 150–400)
RBC: 5.46 MIL/uL — ABNORMAL HIGH (ref 3.87–5.11)
RDW: 15 % (ref 11.5–15.5)
WBC: 8.7 10*3/uL (ref 4.0–10.5)
nRBC: 0 % (ref 0.0–0.2)

## 2020-09-03 LAB — LIPASE, BLOOD: Lipase: 25 U/L (ref 11–51)

## 2020-09-03 MED ORDER — SODIUM CHLORIDE 0.9 % IV BOLUS
1000.0000 mL | Freq: Once | INTRAVENOUS | Status: AC
  Administered 2020-09-03: 1000 mL via INTRAVENOUS

## 2020-09-03 NOTE — ED Triage Notes (Signed)
Pt brought by in by EMS. Reported pt  Complained of abdominal pain, vomited and passed out approx 2 mins . Took approx 10 mins for pt to get back to baseline. Pt was diagnosed with covid 12 days ago.

## 2020-09-03 NOTE — ED Provider Notes (Signed)
[facility] EMERGENCY DEPARTMENT Provider Note   CSN: [id number] Arrival date & time: 09/03/20  1833     History Chief Complaint  Patient presents with  . Abdominal Pain  . Near Syncope    Sonya Oliver is a 80 y.o. female.  Patient presents via EMS after episode of nausea/vomiting, ?abd pain earlier (currently no c/o abd pain), and appearing generally weak. ?brief syncopal event, no trauma or fall. Symptoms acute onset, episodic, moderate - pt currently indicates feels fine and that she is ready to go home. Pt s/p recent hospitalization after dx covid 12 days ago. Pt limited historian - level 5 caveat. Denies headache. No neck or back pain. No chest pain or sob. Denies cough. Pt denies abd pain or nausea. Denies extremity pain or injury. 12/28 dx with covid.   The history is provided by the patient and the EMS personnel. The history is limited by the condition of the patient.  Abdominal Pain Associated symptoms: no chest pain, no constipation, no diarrhea, no dysuria, no fever, no shortness of breath and no sore throat   Near Syncope Pertinent negatives include no chest pain, no headaches and no shortness of breath.       Past Medical History:  Diagnosis Date  . Arthritis   . Bladder cancer ([place]) 2015  . Cataract   . CHF (congestive heart failure) ([place])    NO CARDIOLOGIST---  MONITORED BY PCP DR Jacelyn Grip  . COPD with emphysema ([place])    last Acute Exacerbation 08-21-2014  . H/O pulmonary edema    jan 2014--  acute CHF w/ pulmonary edema  . Hypertension   . Hyperthyroidism    currently no meds -- labs stable  . Nocturia   . Nocturnal oxygen desaturation    uses O2  HS via [place]--  and PRN daytime  . Osteoporosis   . Stroke ([place])   . Type 2 diabetes mellitus ([place])   . Ureteral tumor     Patient Active Problem List   Diagnosis Date Noted  . Mass of lower lobe of left lung 08/27/2020  . Acute respiratory failure with hypoxia ([place]) 08/27/2020  . 2019 novel coronavirus  disease (COVID-19) 08/26/2020  . 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 Chronic diastolic CHF (congestive heart failure) ([place]) 09/29/2016  . History of smoking 25-50 pack years 07/20/2016  . Aortic atherosclerosis ([place]) 07/20/2016  . Osteoporosis 07/08/2016  . Diabetes mellitus, type 2 ([place]) 12/14/2015  . COPD (chronic obstructive pulmonary disease) ([place]) 12/19/2012  . Vitamin D deficiency 12/19/2012  . HTN (hypertension) 12/19/2012  . Edema leg 09/12/2012  . History of anemia 03/21/2012  . Subclinical hyperthyroidism 02/23/2012    Past Surgical History:  Procedure Laterality Date  . ABDOMINAL HYSTERECTOMY  1980'S   W/ BILATERAL SALPINGO-OOPHORECTOMY  . CATARACT EXTRACTION W/PHACO Left 01/31/2014   Procedure: CATARACT EXTRACTION PHACO AND INTRAOCULAR LENS PLACEMENT (IOC);  Surgeon: Tonny Branch, MD;  Location: [facility];  Service: Ophthalmology;  Laterality: Left;  CDE: 23.78  . CATARACT EXTRACTION W/PHACO Right 02/28/2014   Procedure: CATARACT EXTRACTION PHACO AND INTRAOCULAR LENS PLACEMENT (IOC);  Surgeon: Tonny Branch, MD;  Location: [facility];  Service: Ophthalmology;  Laterality: Right;  CDE:  11.34  . CYSTOSCOPY  03/23/2012   Procedure: CYSTOSCOPY;  Surgeon: Claybon Jabs, MD;  Location: [facility];  Service: Urology;  Laterality: N/A;  . CYSTOSCOPY WITH RETROGRADE PYELOGRAM, URETEROSCOPY AND STENT PLACEMENT Right 10/21/2014   Procedure: CYSTOSCOPY WITH RETROGRADE PYELOGRAM, URETEROSCOPY, BLADDER TUMOR BIOPSY;  Surgeon: Elta Guadeloupe  Nedra Hai, MD;  [facility];  Service: Urology;  Laterality: Right;  . TRANSTHORACIC ECHOCARDIOGRAM  06-21-2013   moderate LVH,  grade I diastolic dysfunction, ef 26-71%,  mild MR & TR  . TRANSURETHRAL RESECTION OF BLADDER TUMOR  03/23/2012   Procedure: TRANSURETHRAL RESECTION OF BLADDER TUMOR (TURBT);  Surgeon: Claybon Jabs, MD;  Location: [facility];  Service: Urology;  Laterality: N/A;  . TRANSURETHRAL RESECTION OF BLADDER TUMOR N/A 01/08/2013   Procedure:  TRANSURETHRAL RESECTION OF BLADDER TUMOR (TURBT);  Surgeon: Claybon Jabs, MD;  Location: [facility];  Service: Urology;  Laterality: N/A;     OB History   No obstetric history on file.     Family History  Problem Relation Age of Onset  . Diabetes Father   . Congestive Heart Failure Father   . COPD Father   . Kidney disease Father   . Hypertension Father   . Diabetes Mother        with kidney disease  . Kidney disease Mother   . Hypertension Mother   . Hyperlipidemia Mother   . Heart attack Mother   . Stroke Mother   . Heart attack Brother   . Asthma Brother   . Diabetes Daughter   . Diabetes Son   . Cancer Brother        throat  . Hypertension Brother     Social History   Tobacco Use  . Smoking status: Former Smoker    Packs/day: 0.25    Years: 50.00    Pack years: 12.50    Types: Cigarettes    Quit date: 09/24/2006    Years since quitting: 13.9  . Smokeless tobacco: Never Used  Vaping Use  . Vaping Use: Never used  Substance Use Topics  . Alcohol use: No  . Drug use: No    Home Medications Prior to Admission medications   Medication Sig Start Date End Date Taking? Authorizing Provider  albuterol (PROVENTIL) (2.5 MG/3ML) 0.083% nebulizer solution Inhale 3 mLs into the lungs 4 (four) times daily as needed for shortness of breath. Patient not taking: No sig reported 02/19/19   Terald Sleeper, PA-C  albuterol (VENTOLIN HFA) 108 (90 Base) MCG/ACT inhaler INHALE 1-2 PUFFS BY MOUTH EVERY 6 HOURS AS NEEDED FOR WHEEZE OR SHORTNESS OF BREATH Patient taking differently: Inhale 1-2 puffs into the lungs every 6 (six) hours as needed. 08/04/20   Janora Norlander, DO  alendronate (FOSAMAX) 70 MG tablet Take 1 tablet (70 mg total) by mouth once a week. Take with a full glass of water on an empty stomach. 08/25/20   Claretta Fraise, MD  ascorbic acid (VITAMIN C) 500 MG tablet Take 1 tablet (500 mg total) by mouth daily for 15 days. 09/02/20 09/17/20  Manuella Ghazi,  Pratik D, DO  atorvastatin (LIPITOR) 10 MG tablet TAKE 1 TABLET BY MOUTH EVERY DAY Patient taking differently: Take 10 mg by mouth daily. 08/25/20   Claretta Fraise, MD  carvedilol (COREG) 3.125 MG tablet Take 1 tablet (3.125 mg total) by mouth 2 (two) times daily. 08/25/20   Claretta Fraise, MD  Cholecalciferol (VITAMIN D3) 2000 UNITS TABS Take 1 tablet by mouth daily.    [provider]  cycloSPORINE (RESTASIS MULTIDOSE) 0.05 % ophthalmic emulsion Place 1 drop into both eyes 2 (two) times daily. 04/23/20   Loman Brooklyn, FNP  Fluticasone-Salmeterol (ADVAIR DISKUS) 100-50 MCG/DOSE AEPB Inhale 1 puff into the lungs 2 (two) times daily. 05/16/20  Loman Brooklyn, FNP  furosemide (LASIX) 40 MG tablet Take 1 tablet (40 mg total) by mouth daily. 08/25/20   Claretta Fraise, MD  guaiFENesin-dextromethorphan (ROBITUSSIN DM) 100-10 MG/5ML syrup Take 10 mLs by mouth every 4 (four) hours as needed for cough. 09/01/20   Manuella Ghazi, Pratik D, DO  lisinopril (ZESTRIL) 40 MG tablet Take 1 tablet (40 mg total) by mouth daily. 08/07/20   Gwenlyn Perking, FNP  metFORMIN (GLUCOPHAGE) 500 MG tablet Take 1 tablet (500 mg total) by mouth daily. 07/10/20   Gwenlyn Perking, FNP  methimazole (TAPAZOLE) 5 MG tablet Take 1 tablet (5 mg total) by mouth daily. Patient not taking: No sig reported 06/25/20   Cassandria Anger, MD  pantoprazole (PROTONIX) 40 MG tablet Take 1 tablet (40 mg total) by mouth daily for 10 days. 09/02/20 09/12/20  Manuella Ghazi, Pratik D, DO  potassium chloride SA (KLOR-CON) 20 MEQ tablet Take 1 tablet (20 mEq total) by mouth daily. 08/25/20   Claretta Fraise, MD  predniSONE (DELTASONE) 20 MG tablet Take 2 tablets (40 mg total) by mouth daily with breakfast for 3 days. 09/02/20 09/05/20  Manuella Ghazi, Pratik D, DO  zinc sulfate 220 (50 Zn) MG capsule Take 1 capsule (220 mg total) by mouth daily for 15 days. 09/02/20 09/17/20  Heath Lark D, DO    Allergies    Patient has no known allergies.  Review of Systems    Review of Systems  Constitutional: Negative for fever.  HENT: Negative for sore throat.   Eyes: Negative for visual disturbance.  Respiratory: Negative for shortness of breath.   Cardiovascular: Positive for near-syncope. Negative for chest pain, palpitations and leg swelling.  Gastrointestinal: Negative for constipation and diarrhea.  Genitourinary: Negative for dysuria and flank pain.  Musculoskeletal: Negative for back pain and neck pain.  Skin: Negative for rash.  Neurological: Negative for speech difficulty, weakness and headaches.  Hematological: Does not bruise/bleed easily.  Psychiatric/Behavioral: Negative for agitation.    Physical Exam Updated Vital Signs BP (!) 151/64 (BP Location: Right Arm)   Pulse 93   Temp 98.5 F (36.9 C) (Oral)   Resp (!) 21   Physical Exam Vitals and nursing note reviewed.  Constitutional:      Appearance: Normal appearance. She is well-developed.  HENT:     Head: Atraumatic.     Nose: Nose normal.     Mouth/Throat:     Mouth: Mucous membranes are moist.  Eyes:     General: No scleral icterus.    Conjunctiva/sclera: Conjunctivae normal.     Pupils: Pupils are equal, round, and reactive to light.  Neck:     Vascular: No carotid bruit.     Trachea: No tracheal deviation.  Cardiovascular:     Rate and Rhythm: Normal rate and regular rhythm.     Pulses: Normal pulses.     Heart sounds: Normal heart sounds. No murmur heard. No friction rub. No gallop.   Pulmonary:     Effort: Pulmonary effort is normal. No respiratory distress.     Breath sounds: Normal breath sounds.  Abdominal:     General: Bowel sounds are normal. There is no distension.     Palpations: Abdomen is soft. There is no mass.     Tenderness: There is no abdominal tenderness. There is no guarding.  Genitourinary:    Comments: No cva tenderness.  Musculoskeletal:        General: No swelling or tenderness.     Cervical back: Normal range  of motion and neck supple. No  rigidity or tenderness. No muscular tenderness.     Right lower leg: No edema.     Left lower leg: No edema.  Skin:    General: Skin is warm and dry.     Findings: No rash.  Neurological:     Mental Status: She is alert.     Cranial Nerves: No cranial nerve deficit.     Comments: Alert, speech normal, not grossly dysarthric. Motor intact bil, stre 5/5. Sens grossly intact.   Psychiatric:        Mood and Affect: Mood normal.     ED Results / Procedures / Treatments   Labs (all labs ordered are listed, but only abnormal results are displayed) Results for orders placed or performed during the hospital encounter of 09/03/20  CBC  Result Value Ref Range   WBC 8.7 4.0 - 10.5 K/uL   RBC 5.46 (H) 3.87 - 5.11 MIL/uL   Hemoglobin 14.1 12.0 - 15.0 g/dL   HCT 47.2 (H) 36.0 - 46.0 %   MCV 86.4 80.0 - 100.0 fL   MCH 25.8 (L) 26.0 - 34.0 pg   MCHC 29.9 (L) 30.0 - 36.0 g/dL   RDW 15.0 11.5 - 15.5 %   Platelets 215 150 - 400 K/uL   nRBC 0.0 0.0 - 0.2 %  Comprehensive metabolic panel  Result Value Ref Range   Sodium 136 135 - 145 mmol/L   Potassium 3.5 3.5 - 5.1 mmol/L   Chloride 92 (L) 98 - 111 mmol/L   CO2 32 22 - 32 mmol/L   Glucose, Bld 168 (H) 70 - 99 mg/dL   BUN 37 (H) 8 - 23 mg/dL   Creatinine, Ser 0.98 0.44 - 1.00 mg/dL   Calcium 9.3 8.9 - 10.3 mg/dL   Total Protein 8.3 (H) 6.5 - 8.1 g/dL   Albumin 3.4 (L) 3.5 - 5.0 g/dL   AST 20 15 - 41 U/L   ALT 17 0 - 44 U/L   Alkaline Phosphatase 50 38 - 126 U/L   Total Bilirubin 1.5 (H) 0.3 - 1.2 mg/dL   GFR, Estimated 58 (L) >60 mL/min   Anion gap 12 5 - 15  Lipase, blood  Result Value Ref Range   Lipase 25 11 - 51 U/L   DG Lumbar Spine Complete  Result Date: 08/15/2020 CLINICAL DATA:  Increased weakness and multiple falls, BILATERAL knee pain, pelvic pain, lower back pain, limited range of motion in knees EXAM: LUMBAR SPINE - COMPLETE 4+ VIEW COMPARISON:  None FINDINGS: Five non-rib-bearing lumbar vertebra. Multilevel disc space  narrowing and endplate spur formation. Multilevel facet degenerative changes. Mild anterolisthesis L4-L5 likely due to facet degenerative changes. No fracture, additional subluxation, or bone destruction. No spondylolysis. SI joints poorly visualized. Atherosclerotic calcifications abdominal aorta. 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 No acute abnormalities. Electronically Signed   By: Lavonia Dana M.D.   On: 08/15/2020 15:20   DG Pelvis 1-2 Views  Result Date: 08/15/2020 CLINICAL DATA:  Fall. Increasing weakness with multiple falls. EXAM: PELVIS - 1-2 VIEW COMPARISON:  Reformats from abdominopelvic CT 02/23/2012 FINDINGS: The cortical margins of the bony pelvis are intact. No fracture. Pubic symphysis and sacroiliac joints are congruent. Mild to moderate bilateral hip osteoarthritis. Both femoral heads are well-seated in the respective acetabula. There is a 2.1 cm lucent lesion in the right femoral neck with peripherally sclerotic margins. No prior exams available for comparison.  IMPRESSION: 1. No fracture of the pelvis. 2. Mild to moderate bilateral hip osteoarthritis. 3. Peripherally sclerotic 2.1 cm lucent lesion in the right femoral neck. This was not seen on 2013 CT. Differential considerations include subchondral cyst or focal bone lesion. Recommend further evaluation with MRI. Electronically Signed   By: Keith Rake M.D.   On: 08/15/2020 15:00   CT Head Wo Contrast  Result Date: 08/15/2020 CLINICAL DATA:  Minor head trauma, multiple falls, diabetes mellitus, hypertension, history bladder cancer EXAM: CT HEAD WITHOUT CONTRAST TECHNIQUE: Contiguous axial images were obtained from the base of the skull through the vertex without intravenous contrast. Sagittal and coronal MPR images reconstructed from axial data set. COMPARISON:  03/21/2012 FINDINGS: Brain: Generalized atrophy. Normal ventricular morphology. No midline shift or  mass effect. Small vessel chronic ischemic changes of deep cerebral white matter. No intracranial hemorrhage, mass lesion, evidence of acute infarction, or extra-axial fluid collection. Vascular: Atherosclerotic calcifications of internal carotid arteries at skull base. Skull: Calvaria intact Sinuses/Orbits: Clear Other: N/A IMPRESSION: Atrophy with small vessel chronic ischemic changes of deep cerebral white matter. No acute intracranial abnormalities. Electronically Signed   By: Lavonia Dana M.D.   On: 08/15/2020 15:21   CT Angio Chest PE W/Cm &/Or Wo Cm  Result Date: 08/27/2020 CLINICAL DATA:  Shortness of breath EXAM: CT ANGIOGRAPHY CHEST WITH CONTRAST TECHNIQUE: Multidetector CT imaging of the chest was performed using the standard protocol during bolus administration of intravenous contrast. Multiplanar CT image reconstructions and MIPs were obtained to evaluate the vascular anatomy. CONTRAST:  121mL OMNIPAQUE IOHEXOL 350 MG/ML SOLN COMPARISON:  None. FINDINGS: Cardiovascular: There is a optimal opacification of the pulmonary arteries. There is no central,segmental, or subsegmental filling defects within the pulmonary arteries. The heart is normal in size. No pericardial effusion or thickening. No evidence right heart strain. There is normal three-vessel brachiocephalic anatomy without proximal stenosis. The thoracic aorta is normal in appearance. Mediastinum/Nodes: No hilar, mediastinal, or axillary adenopathy. Thyroid gland, trachea, and esophagus demonstrate no significant findings. Lungs/Pleura: A rounded 3.1 cm mass is seen within the posterior left lower lobe. Centrilobular emphysematous changes are seen. A small amount of patchy airspace opacity seen at the right lung base. Upper Abdomen: No acute abnormalities present in the visualized portions of the upper abdomen. Musculoskeletal: No chest wall abnormality. No acute or significant osseous findings. Review of the MIP images confirms the above  findings. IMPRESSION: No central, segmental, or subsegmental pulmonary embolism. 3.1 cm rounded mass in the posterior left lower lobe. This could represent a primary pulmonary neoplasm. Small amount of patchy airspace opacity at the right lung base, likely due to atelectasis and/or early infectious etiology. Emphysema (ICD10-J43.9). Electronically Signed   By: Prudencio Pair M.D.   On: 08/27/2020 03:03   CT PELVIS WO CONTRAST  Result Date: 08/27/2020 CLINICAL DATA:  Multiple falls, right hip pain EXAM: CT PELVIS WITHOUT CONTRAST TECHNIQUE: Multidetector CT imaging of the pelvis was performed following the standard protocol without intravenous contrast. COMPARISON:  None. FINDINGS: Urinary Tract:  No abnormality visualized. Bowel: There is large volume stool seen within the visualized large bowel. No evidence of obstruction or focal inflammation. No free fluid within the pelvis. Vascular/Lymphatic: Moderate aortoiliac atherosclerotic calcification. No aortic aneurysm. No pathologic adenopathy within the abdomen and pelvis. Reproductive:  Uterus absent.  No adnexal masses. Other:  Rectum unremarkable.  No abdominal wall hernia identified. Musculoskeletal: The osseous structures are intact. No evidence of fracture or dislocation. Degenerative changes are seen within the lumbar  spine. There is degenerative enthesopathy involving the insertion of the hamstrings bilaterally. IMPRESSION: No acute fracture or dislocation involving the pelvis. Large volume stool within the a visualized bowel within the pelvis. No evidence of obstruction. Aortic Atherosclerosis (ICD10-I70.0). Electronically Signed   By: Fidela Salisbury MD   On: 08/27/2020 02:56   DG Chest Port 1 View  Result Date: 08/26/2020 CLINICAL DATA:  Shortness of breath EXAM: PORTABLE CHEST 1 VIEW COMPARISON:  12/22/2016 FINDINGS: Hyperinflation with emphysematous disease. No consolidation or effusion. Mild cardiomegaly with aortic atherosclerosis. No  pneumothorax. IMPRESSION: No active disease. Hyperinflation with emphysematous disease. Mild cardiomegaly. Electronically Signed   By: Donavan Foil M.D.   On: 08/26/2020 21:29   DG Knee Complete 4 Views Left  Result Date: 08/15/2020 CLINICAL DATA:  Fall. Bilateral knee pain, increasing weakness with multiple falls. Limited range of motion. EXAM: LEFT KNEE - COMPLETE 4+ VIEW COMPARISON:  Left knee radiograph 11/22/2018 FINDINGS: No acute or healing fracture. Tricompartmental osteoarthritis most prominent in the medial tibiofemoral compartment with prominent joint space loss and peripheral spurring. No significant joint effusion. Small quadriceps and patellar tendon enthesophytes. Mild soft tissue edema. There are vascular calcifications. IMPRESSION: 1. No acute or healing fracture of the left knee. 2. Tricompartmental osteoarthritis, most prominent in the medial tibiofemoral compartment. Degenerative changes are similar to prior exam. Electronically Signed   By: Keith Rake M.D.   On: 08/15/2020 14:55   DG Knee Complete 4 Views Right  Result Date: 08/15/2020 CLINICAL DATA:  Fall. Bilateral knee pain. Increasing weakness and multiple falls. EXAM: RIGHT KNEE - COMPLETE 4+ VIEW COMPARISON:  None. FINDINGS: No fracture or dislocation. Moderate tricompartmental osteoarthritis with peripheral spurring. Quadriceps tendon enthesophyte. There is a small knee joint effusion. Lateral chondrocalcinosis. Mild soft tissue edema. There are vascular calcifications. IMPRESSION: 1. No fracture or dislocation of the right knee. 2. Moderate tricompartmental osteoarthritis with small joint effusion. 3. Lateral chondrocalcinosis. Electronically Signed   By: Keith Rake M.D.   On: 08/15/2020 14:56   DG Knee Left Port  Result Date: 08/27/2020 CLINICAL DATA:  Multiple falls EXAM: PORTABLE LEFT KNEE - 1-2 VIEW COMPARISON:  08/15/2020 FINDINGS: Negative for fracture.  Normal alignment. Moderate degenerative change in  the medial joint compartment with joint space narrowing and spurring. Mild degenerative change laterally and in the patella. Small joint effusion. Atherosclerotic calcification. IMPRESSION: Negative for fracture. Electronically Signed   By: Franchot Gallo M.D.   On: 08/27/2020 08:08     EKG EKG Interpretation  Date/Time:  Wednesday September 03 2020 18:42:02 EST Ventricular Rate:  80 PR Interval:    QRS Duration: 107 QT Interval:  412 QTC Calculation: 476 R Axis:   77 Text Interpretation: Sinus rhythm Ventricular premature complex Left ventricular hypertrophy Confirmed by Lajean Saver [phone]) on 09/03/2020 6:55:48 PM   Radiology No results found.  Procedures Procedures (including critical care time)  Medications Ordered in ED Medications - No data to display  ED Course  I have reviewed the triage vital signs and the nursing notes.  Pertinent labs & imaging results that were available during my care of the patient were reviewed by me and considered in my medical decision making (see chart for details).    MDM Rules/Calculators/A&P                         Iv ns. Continuous pulse ox and cardiac monitoring. Stat labs.   Reviewed nursing notes and prior charts for additional history.  Labs reviewed/interpreted by me - wbc normal.  Recheck abd soft nt. No nv. No new c/o.   2345 UA pending. Signed out to Dr Betsey Holiday to check UA. Anticipate probable d/c to home.       Final Clinical Impression(s) / ED Diagnoses Final diagnoses:  None    Rx / DC Orders ED Discharge Orders    None       Lajean Saver, MD 09/03/20 2347

## 2020-09-03 NOTE — ED Notes (Signed)
In and out cath unsuccessful times two by ed tech, bladder scan shows 126ml, attempt made by this RN, attempt unsuccessful. MD notified.

## 2020-09-03 NOTE — ED Notes (Signed)
Pt in bed, pt oriented to self, pt denies pain, pt states that she is ready to go home, md at bedside to evaluate pt.

## 2020-09-04 ENCOUNTER — Emergency Department (HOSPITAL_COMMUNITY): Payer: Medicare Other

## 2020-09-04 LAB — URINALYSIS, ROUTINE W REFLEX MICROSCOPIC
Bacteria, UA: NONE SEEN
Bilirubin Urine: NEGATIVE
Glucose, UA: NEGATIVE mg/dL
Hgb urine dipstick: NEGATIVE
Ketones, ur: 5 mg/dL — AB
Leukocytes,Ua: NEGATIVE
Nitrite: NEGATIVE
Protein, ur: 30 mg/dL — AB
Specific Gravity, Urine: 1.027 (ref 1.005–1.030)
pH: 5 (ref 5.0–8.0)

## 2020-09-04 MED ORDER — IOHEXOL 300 MG/ML  SOLN
100.0000 mL | Freq: Once | INTRAMUSCULAR | Status: AC | PRN
  Administered 2020-09-04: 100 mL via INTRAVENOUS

## 2020-09-04 NOTE — ED Provider Notes (Signed)
Patient signed out to me by Dr. Ashok Cordia to follow-up on urinalysis.  Patient originally seen after experiencing nausea, vomiting, abdominal pain and subsequent near syncope after vomiting.  Urinalysis is unremarkable.  Upon recheck, patient has been complaining of abdominal pain intermittently.  She does have mild diffuse tenderness without guarding or rebound.  She therefore underwent CT scan to further evaluate.  No acute pathology noted.  Patient appropriate for discharge.   Orpah Greek, MD 09/04/20 [phone]

## 2020-09-04 NOTE — Discharge Instructions (Signed)
It was our pleasure to provide your ER care today - we hope that you feel better.  Drink plenty of fluids - stay well hydrated.   Follow up with your doctor in the coming week.  Return to ER if worse, new symptoms, fevers, new or severe pain, trouble breathing, persistent vomiting, or other concern.

## 2020-09-11 ENCOUNTER — Other Ambulatory Visit: Payer: Self-pay | Admitting: Family Medicine

## 2020-09-11 DIAGNOSIS — J441 Chronic obstructive pulmonary disease with (acute) exacerbation: Secondary | ICD-10-CM

## 2020-09-15 ENCOUNTER — Other Ambulatory Visit: Payer: Self-pay | Admitting: Family Medicine

## 2020-09-15 DIAGNOSIS — M810 Age-related osteoporosis without current pathological fracture: Secondary | ICD-10-CM

## 2020-09-18 ENCOUNTER — Other Ambulatory Visit: Payer: Self-pay

## 2020-09-18 ENCOUNTER — Encounter: Payer: Self-pay | Admitting: Family Medicine

## 2020-09-18 ENCOUNTER — Ambulatory Visit (INDEPENDENT_AMBULATORY_CARE_PROVIDER_SITE_OTHER): Payer: Medicare Other | Admitting: Family Medicine

## 2020-09-18 VITALS — BP 169/81 | HR 84 | Temp 98.1°F | Ht 66.0 in | Wt 167.6 lb

## 2020-09-18 DIAGNOSIS — Z8616 Personal history of COVID-19: Secondary | ICD-10-CM

## 2020-09-18 DIAGNOSIS — I10 Essential (primary) hypertension: Secondary | ICD-10-CM | POA: Diagnosis not present

## 2020-09-18 DIAGNOSIS — R918 Other nonspecific abnormal finding of lung field: Secondary | ICD-10-CM

## 2020-09-18 DIAGNOSIS — R531 Weakness: Secondary | ICD-10-CM

## 2020-09-18 DIAGNOSIS — R296 Repeated falls: Secondary | ICD-10-CM

## 2020-09-18 MED ORDER — AMLODIPINE BESYLATE 2.5 MG PO TABS: 2.5000 mg | ORAL_TABLET | Freq: Every day | ORAL | 2 refills | Status: DC

## 2020-09-18 MED ORDER — FUROSEMIDE 40 MG PO TABS: 40.0000 mg | ORAL_TABLET | Freq: Every day | ORAL | 1 refills | Status: DC

## 2020-09-18 MED ORDER — PANTOPRAZOLE SODIUM 40 MG PO TBEC: 40.0000 mg | DELAYED_RELEASE_TABLET | Freq: Every day | ORAL | 1 refills | Status: DC

## 2020-09-18 NOTE — Progress Notes (Signed)
Assessment & Plan:  1. Essential hypertension - Uncontrolled. Continue furosemide and lisinopril.  Add amlodipine.  Encourage patient to write down the blood pressure readings the physical therapist gets and them bring them back to her next visit. - furosemide (LASIX) 40 MG tablet; Take 1 tablet (40 mg total) by mouth daily.  Dispense: 90 tablet; Refill: 1 - amLODipine (NORVASC) 2.5 MG tablet; Take 1 tablet (2.5 mg total) by mouth daily.  Dispense: 30 tablet; Refill: 2 - CBC with Differential/Platelet - CMP14+EGFR  2. History of COVID-19 - Resolved.  - CBC with Differential/Platelet - CMP14+EGFR  3. Mass of lower lobe of left lung - Ambulatory referral to Pulmonology  4. Recurrent falls - Continue working with physical therapy at home.  5. Generalized weakness - Continue working with physical therapy at home.   Return in about 6 weeks (around 10/30/2020) for HTN.  Hendricks Limes, MSN, APRN, FNP-C [facility]  Subjective:    Patient ID: Sonya Oliver, female    DOB: Oct 16, 1940, 80 y.o.   MRN: [id number]  Patient Care Team: Loman Brooklyn, FNP as PCP - General (Family Medicine) Kathie Rhodes, MD (Inactive) as Consulting Physician (Urology) Steffanie Rainwater, DPM as Consulting Physician (Podiatry) Melina Schools, OD as Consulting Physician (Optometry) Sinda Du, MD as Consulting Physician (Pulmonary Disease) Jacelyn Pi, MD as Consulting Physician (Endocrinology)   Chief Complaint:  Chief Complaint  Patient presents with  . Hypertension    6 week follow up   . Hospitalization Follow-up    1/5 [facility]- near syncope.  Patient not had another episode since she was in the hospital     HPI: Sonya Oliver is a 80 y.o. female presenting on 09/18/2020 for Hypertension (6 week follow up/) and Hospitalization Follow-up (1/5 [facility]- near syncope.  Patient not had another episode since she was in the hospital )  ER follow-up: Patient is accompanied by her  daughter today who she is okay with being present.  She has had 3 ER visits to [facility] since her last appointment in our office.  The first was on 08/15/2020 due to generalized weakness with a fall.  The second was on 08/26/2020 due to decreased oral intake and multiple falls.  At that time she was admitted due to testing positive for COVID-19.  She was treated with remdesivir and steroids.  She did require oxygen at 4 L via nasal cannula but was able to wean back down to 2 L, which she does have at home if needed.  At that time it was recommended she be discharged to SNF to receive physical therapy, but they were unable to place her due to waiting list.  The daughter requested that she come home with home health.  During that admission a 3.1 cm mass was noted on the left lower lobe of her lung that was suspicious for neoplasm.  The third ER visit was on 09/03/2020 due to nausea, vomiting, abdominal pain, and near syncope after vomiting.  Patient is not requiring oxygen at home very often.  She is working with physical therapy.  She has not yet been referred to the pulmonologist.   Hypertension: Patient's lisinopril was increased to 40 mg once daily at her last visit to this office.  She reports the home health physical therapist has been taking her blood pressure when they come out and it is staying elevated.  New complaints: None  Social history:  Relevant past medical, surgical, family and social history reviewed  and updated as indicated. Interim medical history since our last visit reviewed.  Allergies and medications reviewed and updated.  DATA REVIEWED: CHART IN EPIC  ROS: Negative unless specifically indicated above in HPI.    Current Outpatient Medications:  .  albuterol (PROVENTIL) (2.5 MG/3ML) 0.083% nebulizer solution, Inhale 3 mLs into the lungs 4 (four) times daily as needed for shortness of breath., Disp: 240 mL, Rfl: 11 .  albuterol (VENTOLIN HFA) 108 (90 Base) MCG/ACT inhaler,  INHALE 1-2 PUFFS BY MOUTH EVERY 6 HOURS AS NEEDED FOR WHEEZE OR SHORTNESS OF BREATH, Disp: 8.5 each, Rfl: 0 .  alendronate (FOSAMAX) 70 MG tablet, TAKE 1 TABLET ONCE A WEEK. TAKE WITH A FULL GLASS OF WATER ON AN EMPTY STOMACH., Disp: 4 tablet, Rfl: 0 .  atorvastatin (LIPITOR) 10 MG tablet, TAKE 1 TABLET BY MOUTH EVERY DAY (Patient taking differently: Take 10 mg by mouth daily.), Disp: 90 tablet, Rfl: 0 .  carvedilol (COREG) 3.125 MG tablet, Take 1 tablet (3.125 mg total) by mouth 2 (two) times daily., Disp: 180 tablet, Rfl: 0 .  Cholecalciferol (VITAMIN D3) 2000 UNITS TABS, Take 1 tablet by mouth daily., Disp: , Rfl:  .  cycloSPORINE (RESTASIS MULTIDOSE) 0.05 % ophthalmic emulsion, Place 1 drop into both eyes 2 (two) times daily., Disp: 5.5 mL, Rfl: 5 .  Fluticasone-Salmeterol (ADVAIR DISKUS) 100-50 MCG/DOSE AEPB, Inhale 1 puff into the lungs 2 (two) times daily., Disp: 60 each, Rfl: 11 .  furosemide (LASIX) 40 MG tablet, Take 1 tablet (40 mg total) by mouth daily., Disp: 30 tablet, Rfl: 0 .  guaiFENesin-dextromethorphan (ROBITUSSIN DM) 100-10 MG/5ML syrup, Take 10 mLs by mouth every 4 (four) hours as needed for cough., Disp: 118 mL, Rfl: 0 .  lisinopril (ZESTRIL) 40 MG tablet, Take 1 tablet (40 mg total) by mouth daily., Disp: 90 tablet, Rfl: 3 .  metFORMIN (GLUCOPHAGE) 500 MG tablet, Take 1 tablet (500 mg total) by mouth daily., Disp: 90 tablet, Rfl: 1 .  methimazole (TAPAZOLE) 5 MG tablet, Take 1 tablet (5 mg total) by mouth daily., Disp: 90 tablet, Rfl: 1 .  potassium chloride SA (KLOR-CON) 20 MEQ tablet, Take 1 tablet (20 mEq total) by mouth daily., Disp: 30 tablet, Rfl: 0 .  pantoprazole (PROTONIX) 40 MG tablet, Take 1 tablet (40 mg total) by mouth daily for 10 days., Disp: 10 tablet, Rfl: 0   No Known Allergies Past Medical History:  Diagnosis Date  . Arthritis   . Bladder cancer ([place]) 2015  . Cataract   . CHF (congestive heart failure) ([place])    NO CARDIOLOGIST---  MONITORED BY PCP DR  Jacelyn Grip  . COPD with emphysema ([place])    last Acute Exacerbation 08-21-2014  . H/O pulmonary edema    jan 2014--  acute CHF w/ pulmonary edema  . Hypertension   . Hyperthyroidism    currently no meds -- labs stable  . Nocturia   . Nocturnal oxygen desaturation    uses O2  HS via [place]--  and PRN daytime  . Osteoporosis   . Stroke ([place])   . Type 2 diabetes mellitus ([place])   . Ureteral tumor     Past Surgical History:  Procedure Laterality Date  . ABDOMINAL HYSTERECTOMY  1980'S   W/ BILATERAL SALPINGO-OOPHORECTOMY  . CATARACT EXTRACTION W/PHACO Left 01/31/2014   Procedure: CATARACT EXTRACTION PHACO AND INTRAOCULAR LENS PLACEMENT (IOC);  Surgeon: Tonny Branch, MD;  Location: [facility];  Service: Ophthalmology;  Laterality: Left;  CDE: 23.78  . CATARACT EXTRACTION  W/PHACO Right 02/28/2014   Procedure: CATARACT EXTRACTION PHACO AND INTRAOCULAR LENS PLACEMENT (IOC);  Surgeon: Tonny Branch, MD;  Location: [facility];  Service: Ophthalmology;  Laterality: Right;  CDE:  11.34  . CYSTOSCOPY  03/23/2012   Procedure: CYSTOSCOPY;  Surgeon: Claybon Jabs, MD;  Location: [facility];  Service: Urology;  Laterality: N/A;  . CYSTOSCOPY WITH RETROGRADE PYELOGRAM, URETEROSCOPY AND STENT PLACEMENT Right 10/21/2014   Procedure: CYSTOSCOPY WITH RETROGRADE PYELOGRAM, URETEROSCOPY, BLADDER TUMOR BIOPSY;  Surgeon: Claybon Jabs, MD;  Location: [place];  Service: Urology;  Laterality: Right;  . TRANSTHORACIC ECHOCARDIOGRAM  06-21-2013   moderate LVH,  grade I diastolic dysfunction, ef 93-57%,  mild MR & TR  . TRANSURETHRAL RESECTION OF BLADDER TUMOR  03/23/2012   Procedure: TRANSURETHRAL RESECTION OF BLADDER TUMOR (TURBT);  Surgeon: Claybon Jabs, MD;  Location: [facility];  Service: Urology;  Laterality: N/A;  . TRANSURETHRAL RESECTION OF BLADDER TUMOR N/A 01/08/2013   Procedure: TRANSURETHRAL RESECTION OF BLADDER TUMOR (TURBT);  Surgeon: Claybon Jabs, MD;  Location: [facility];  Service: Urology;   Laterality: N/A;    Social History   Socioeconomic History  . Marital status: Widowed    Spouse name: Not on file  . Number of children: 4  . Years of education: 10  . Highest education level: 10th grade  Occupational History  . 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 Smoking status: Former Smoker    Packs/day: 0.25    Years: 50.00    Pack years: 12.50    Types: Cigarettes    Quit date: 09/24/2006    Years since quitting: 13.9  . Smokeless tobacco: Never Used  Vaping Use  . Vaping Use: Never used  Substance and Sexual Activity  . Alcohol use: No  . Drug use: No  . Sexual activity: Not Currently  Other Topics Concern  . Not on file  Social History Narrative   Occupation: worked in [place] yrs   Social Determinants of Radio broadcast assistant Strain: Not on [employer] Insecurity: Not on file  Transportation Needs: Not on file  Physical Activity: Not on file  Stress: Not on file  Social Connections: Not on file  Intimate Partner Violence: Not on file        Objective:    BP (!) 169/81   Pulse 84   Temp 98.1 F (36.7 C) (Temporal)   Ht _0  (1.676 m)   Wt 167 lb 9.6 oz (76 kg)   SpO2 97%   BMI 27.05 kg/m   Wt Readings from Last 3 Encounters:  09/18/20 167 lb 9.6 oz (76 kg)  08/28/20 167 lb 15.9 oz (76.2 kg)  08/15/20 180 lb 5.4 oz (81.8 kg)    Physical Exam Vitals reviewed.  Constitutional:      General: She is not in acute distress.    Appearance: Normal appearance. She is overweight. She is not ill-appearing, toxic-appearing or diaphoretic.  HENT:     Head: Normocephalic and atraumatic.  Eyes:     General: No scleral icterus.       Right eye: No discharge.        Left eye: No discharge.     Conjunctiva/sclera: Conjunctivae normal.  Cardiovascular:     Rate and Rhythm: Normal rate and regular rhythm.     Heart sounds: Normal heart sounds. No murmur heard. No friction rub. No gallop.   Pulmonary:     Effort:  Pulmonary effort is normal. No respiratory distress.     Breath sounds: Normal breath sounds. No stridor. No wheezing, rhonchi or rales.  Musculoskeletal:        General: Normal range of motion.     Cervical back: Normal range of motion.  Skin:    General: Skin is warm and dry.     Capillary Refill: Capillary refill takes less than 2 seconds.  Neurological:     General: No focal deficit present.     Mental Status: She is alert and oriented to person, place, and time. Mental status is at baseline.     Gait: Gait abnormal (rolling walker).  Psychiatric:        Mood and Affect: Mood normal.        Behavior: Behavior normal.        Thought Content: Thought content normal.        Judgment: Judgment normal.     Lab Results  Component Value Date   TSH 0.22 (L) 06/10/2020   Lab Results  Component Value Date   WBC 8.7 09/03/2020   HGB 14.1 09/03/2020   HCT 47.2 (H) 09/03/2020   MCV 86.4 09/03/2020   PLT 215 09/03/2020   Lab Results  Component Value Date   NA 136 09/03/2020   K 3.5 09/03/2020   CO2 32 09/03/2020   GLUCOSE 168 (H) 09/03/2020   BUN 37 (H) 09/03/2020   CREATININE 0.98 09/03/2020   BILITOT 1.5 (H) 09/03/2020   ALKPHOS 50 09/03/2020   AST 20 09/03/2020   ALT 17 09/03/2020   PROT 8.3 (H) 09/03/2020   ALBUMIN 3.4 (L) 09/03/2020   CALCIUM 9.3 09/03/2020   ANIONGAP 12 09/03/2020   Lab Results  Component Value Date   CHOL 172 10/08/2019   Lab Results  Component Value Date   HDL 95 10/08/2019   Lab Results  Component Value Date   LDLCALC 69 10/08/2019   Lab Results  Component Value Date   TRIG 62 08/26/2020   Lab Results  Component Value Date   CHOLHDL 1.8 10/08/2019   Lab Results  Component Value Date   HGBA1C 5.8 (H) 08/29/2020

## 2020-09-19 ENCOUNTER — Encounter: Payer: Self-pay | Admitting: Family Medicine

## 2020-09-19 ENCOUNTER — Ambulatory Visit (INDEPENDENT_AMBULATORY_CARE_PROVIDER_SITE_OTHER): Payer: Medicare Other

## 2020-09-19 DIAGNOSIS — I110 Hypertensive heart disease with heart failure: Secondary | ICD-10-CM

## 2020-09-19 DIAGNOSIS — J439 Emphysema, unspecified: Secondary | ICD-10-CM

## 2020-09-19 DIAGNOSIS — F0390 Unspecified dementia without behavioral disturbance: Secondary | ICD-10-CM

## 2020-09-19 DIAGNOSIS — Z8551 Personal history of malignant neoplasm of bladder: Secondary | ICD-10-CM

## 2020-09-19 DIAGNOSIS — Z7984 Long term (current) use of oral hypoglycemic drugs: Secondary | ICD-10-CM

## 2020-09-19 DIAGNOSIS — R918 Other nonspecific abnormal finding of lung field: Secondary | ICD-10-CM

## 2020-09-19 DIAGNOSIS — M810 Age-related osteoporosis without current pathological fracture: Secondary | ICD-10-CM

## 2020-09-19 DIAGNOSIS — Z9181 History of falling: Secondary | ICD-10-CM

## 2020-09-19 DIAGNOSIS — E119 Type 2 diabetes mellitus without complications: Secondary | ICD-10-CM

## 2020-09-19 DIAGNOSIS — J9601 Acute respiratory failure with hypoxia: Secondary | ICD-10-CM | POA: Diagnosis not present

## 2020-09-19 DIAGNOSIS — J1282 Pneumonia due to coronavirus disease 2019: Secondary | ICD-10-CM | POA: Diagnosis not present

## 2020-09-19 DIAGNOSIS — R296 Repeated falls: Secondary | ICD-10-CM

## 2020-09-19 DIAGNOSIS — Z7983 Long term (current) use of bisphosphonates: Secondary | ICD-10-CM

## 2020-09-19 DIAGNOSIS — U071 COVID-19: Secondary | ICD-10-CM

## 2020-09-19 DIAGNOSIS — Z87440 Personal history of urinary (tract) infections: Secondary | ICD-10-CM

## 2020-09-19 DIAGNOSIS — I5032 Chronic diastolic (congestive) heart failure: Secondary | ICD-10-CM

## 2020-09-19 DIAGNOSIS — Z8673 Personal history of transient ischemic attack (TIA), and cerebral infarction without residual deficits: Secondary | ICD-10-CM

## 2020-09-19 DIAGNOSIS — E0590 Thyrotoxicosis, unspecified without thyrotoxic crisis or storm: Secondary | ICD-10-CM

## 2020-09-19 LAB — CBC WITH DIFFERENTIAL/PLATELET
Basophils Absolute: 0 10*3/uL (ref 0.0–0.2)
Basos: 0 %
EOS (ABSOLUTE): 0 10*3/uL (ref 0.0–0.4)
Eos: 1 %
Hematocrit: 35 % (ref 34.0–46.6)
Hemoglobin: 11.5 g/dL (ref 11.1–15.9)
Immature Grans (Abs): 0 10*3/uL (ref 0.0–0.1)
Immature Granulocytes: 0 %
Lymphocytes Absolute: 0.8 10*3/uL (ref 0.7–3.1)
Lymphs: 20 %
MCH: 26 pg — ABNORMAL LOW (ref 26.6–33.0)
MCHC: 32.9 g/dL (ref 31.5–35.7)
MCV: 79 fL (ref 79–97)
Monocytes Absolute: 0.5 10*3/uL (ref 0.1–0.9)
Monocytes: 11 %
Neutrophils Absolute: 2.7 10*3/uL (ref 1.4–7.0)
Neutrophils: 68 %
Platelets: 105 10*3/uL — ABNORMAL LOW (ref 150–450)
RBC: 4.43 x10E6/uL (ref 3.77–5.28)
RDW: 13.7 % (ref 11.7–15.4)
WBC: 4 10*3/uL (ref 3.4–10.8)

## 2020-09-19 LAB — CMP14+EGFR
ALT: 10 IU/L (ref 0–32)
AST: 12 IU/L (ref 0–40)
Albumin/Globulin Ratio: 1.1 — ABNORMAL LOW (ref 1.2–2.2)
Albumin: 3.5 g/dL — ABNORMAL LOW (ref 3.6–4.6)
Alkaline Phosphatase: 69 IU/L (ref 44–121)
BUN/Creatinine Ratio: 15 (ref 12–28)
BUN: 10 mg/dL (ref 8–27)
Bilirubin Total: 0.6 mg/dL (ref 0.0–1.2)
CO2: 28 mmol/L (ref 20–29)
Calcium: 8.8 mg/dL (ref 8.7–10.3)
Chloride: 105 mmol/L (ref 96–106)
Creatinine, Ser: 0.65 mg/dL (ref 0.57–1.00)
GFR calc Af Amer: 96 mL/min/{1.73_m2} (ref >59)
GFR calc non Af Amer: 84 mL/min/{1.73_m2} (ref >59)
Globulin, Total: 3.3 g/dL (ref 1.5–4.5)
Glucose: 140 mg/dL — ABNORMAL HIGH (ref 65–99)
Potassium: 3.5 mmol/L (ref 3.5–5.2)
Sodium: 144 mmol/L (ref 134–144)
Total Protein: 6.8 g/dL (ref 6.0–8.5)

## 2020-09-22 ENCOUNTER — Ambulatory Visit: Payer: Medicare Other | Admitting: Nurse Practitioner

## 2020-09-22 ENCOUNTER — Encounter: Payer: Self-pay | Admitting: Nurse Practitioner

## 2020-09-22 ENCOUNTER — Ambulatory Visit: Payer: Medicare Other | Admitting: "Endocrinology

## 2020-09-22 ENCOUNTER — Other Ambulatory Visit: Payer: Self-pay

## 2020-09-22 VITALS — BP 175/76 | HR 110 | Ht 66.0 in | Wt 165.2 lb

## 2020-09-22 DIAGNOSIS — R296 Repeated falls: Secondary | ICD-10-CM | POA: Insufficient documentation

## 2020-09-22 DIAGNOSIS — R531 Weakness: Secondary | ICD-10-CM | POA: Insufficient documentation

## 2020-09-22 DIAGNOSIS — E0590 Thyrotoxicosis, unspecified without thyrotoxic crisis or storm: Secondary | ICD-10-CM | POA: Diagnosis not present

## 2020-09-22 DIAGNOSIS — Z8616 Personal history of COVID-19: Secondary | ICD-10-CM | POA: Insufficient documentation

## 2020-09-22 MED ORDER — METHIMAZOLE 10 MG PO TABS: 5.0000 mg | ORAL_TABLET | Freq: Every day | ORAL | 0 refills | Status: DC

## 2020-09-22 NOTE — Patient Instructions (Signed)

## 2020-09-22 NOTE — Progress Notes (Addendum)
09/22/2020         Endocrinology follow-up note   Subjective:    Patient ID: Sonya Oliver, female    DOB: 10-Feb-1941, PCP Loman Brooklyn, FNP.   Past Medical History:  Diagnosis Date  . Arthritis   . Bladder cancer ([place]) 2015  . Cataract   . CHF (congestive heart failure) ([place])    NO CARDIOLOGIST---  MONITORED BY PCP DR Jacelyn Grip  . COPD with emphysema ([place])    last Acute Exacerbation 08-21-2014  . H/O pulmonary edema    jan 2014--  acute CHF w/ pulmonary edema  . Hypertension   . Hyperthyroidism    currently no meds -- labs stable  . Nocturia   . Nocturnal oxygen desaturation    uses O2  HS via [place]--  and PRN daytime  . Osteoporosis   . Stroke ([place])   . Type 2 diabetes mellitus ([place])   . Ureteral tumor     Past Surgical History:  Procedure Laterality Date  . ABDOMINAL HYSTERECTOMY  1980'S   W/ BILATERAL SALPINGO-OOPHORECTOMY  . CATARACT EXTRACTION W/PHACO Left 01/31/2014   Procedure: CATARACT EXTRACTION PHACO AND INTRAOCULAR LENS PLACEMENT (IOC);  Surgeon: Tonny Branch, MD;  Location: [facility];  Service: Ophthalmology;  Laterality: Left;  CDE: 23.78  . CATARACT EXTRACTION W/PHACO Right 02/28/2014   Procedure: CATARACT EXTRACTION PHACO AND INTRAOCULAR LENS PLACEMENT (IOC);  Surgeon: Tonny Branch, MD;  Location: [facility];  Service: Ophthalmology;  Laterality: Right;  CDE:  11.34  . CYSTOSCOPY  03/23/2012   Procedure: CYSTOSCOPY;  Surgeon: Claybon Jabs, MD;  Location: [facility];  Service: Urology;  Laterality: N/A;  . CYSTOSCOPY WITH RETROGRADE PYELOGRAM, URETEROSCOPY AND STENT PLACEMENT Right 10/21/2014   Procedure: CYSTOSCOPY WITH RETROGRADE PYELOGRAM, URETEROSCOPY, BLADDER TUMOR BIOPSY;  Surgeon: Claybon Jabs, MD;  Location: [facility];  Service: Urology;  Laterality: Right;  . TRANSTHORACIC ECHOCARDIOGRAM  06-21-2013   moderate LVH,  grade I diastolic dysfunction, ef 85-46%,  mild MR & TR  . TRANSURETHRAL RESECTION OF BLADDER TUMOR  03/23/2012   Procedure:  TRANSURETHRAL RESECTION OF BLADDER TUMOR (TURBT);  Surgeon: Claybon Jabs, MD;  Location: [facility];  Service: Urology;  Laterality: N/A;  . TRANSURETHRAL RESECTION OF BLADDER TUMOR N/A 01/08/2013   Procedure: TRANSURETHRAL RESECTION OF BLADDER TUMOR (TURBT);  Surgeon: Claybon Jabs, MD;  Location: [facility];  Service: Urology;  Laterality: N/A;    Social History   Socioeconomic History  . Marital status: Widowed    Spouse name: Not on file  . Number of children: 4  . Years of education: 10  . Highest education level: 10th grade  Occupational History  . 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 Smoking status: Former Smoker    Packs/day: 0.25    Years: 50.00    Pack years: 12.50    Types: Cigarettes    Quit date: 09/24/2006    Years since quitting: 14.0  . Smokeless tobacco: Never Used  Vaping Use  . Vaping Use: Never used  Substance and Sexual Activity  . Alcohol use: No  . Drug use: No  . Sexual activity: Not Currently  Other Topics Concern  . Not on file  Social History Narrative   Occupation: worked in [place] yrs   Social Determinants of Radio broadcast assistant Strain: Not on [employer] Insecurity: Not on file  Transportation Needs: Not on file  Physical Activity: Not on file  Stress: Not on file  Social Connections: Not on file    Family History  Problem Relation Age of Onset  . Diabetes Father   . Congestive Heart Failure Father   . COPD Father   . Kidney disease Father   . Hypertension Father   . Diabetes Mother        with kidney disease  . Kidney disease Mother   . Hypertension Mother   . Hyperlipidemia Mother   . Heart attack Mother   . Stroke Mother   . Heart attack Brother   . Asthma Brother   . Diabetes Daughter   . Diabetes Son   . Cancer Brother        throat  . Hypertension Brother     Outpatient Encounter Medications as of 09/22/2020  Medication Sig  . albuterol (PROVENTIL) (2.5 MG/3ML)  0.083% nebulizer solution Inhale 3 mLs into the lungs 4 (four) times daily as needed for shortness of breath.  [name] albuterol (VENTOLIN HFA) 108 (90 Base) MCG/ACT inhaler INHALE 1-2 PUFFS BY MOUTH EVERY 6 HOURS AS NEEDED FOR WHEEZE OR SHORTNESS OF BREATH  . alendronate (FOSAMAX) 70 MG tablet TAKE 1 TABLET ONCE A WEEK. TAKE WITH A FULL GLASS OF WATER ON AN EMPTY STOMACH.  [name] amLODipine (NORVASC) 2.5 MG tablet Take 1 tablet (2.5 mg total) by mouth daily.  [name] atorvastatin (LIPITOR) 10 MG tablet TAKE 1 TABLET BY MOUTH EVERY DAY (Patient taking differently: Take 10 mg by mouth daily.)  . carvedilol (COREG) 3.125 MG tablet Take 1 tablet (3.125 mg total) by mouth 2 (two) times daily.  . Cholecalciferol (VITAMIN D3) 2000 UNITS TABS Take 1 tablet by mouth daily.  . cycloSPORINE (RESTASIS MULTIDOSE) 0.05 % ophthalmic emulsion Place 1 drop into both eyes 2 (two) times daily.  . Fluticasone-Salmeterol (ADVAIR DISKUS) 100-50 MCG/DOSE AEPB Inhale 1 puff into the lungs 2 (two) times daily.  . furosemide (LASIX) 40 MG tablet Take 1 tablet (40 mg total) by mouth daily.  [name] guaiFENesin-dextromethorphan (ROBITUSSIN DM) 100-10 MG/5ML syrup Take 10 mLs by mouth every 4 (four) hours as needed for cough.  [name] lisinopril (ZESTRIL) 40 MG tablet Take 1 tablet (40 mg total) by mouth daily.  . metFORMIN (GLUCOPHAGE) 500 MG tablet Take 1 tablet (500 mg total) by mouth daily.  . pantoprazole (PROTONIX) 40 MG tablet Take 1 tablet (40 mg total) by mouth daily for 10 days.  . potassium chloride SA (KLOR-CON) 20 MEQ tablet Take 1 tablet (20 mEq total) by mouth daily.  . [DISCONTINUED] methimazole (TAPAZOLE) 5 MG tablet Take 1 tablet (5 mg total) by mouth daily.  . methimazole (TAPAZOLE) 10 MG tablet Take 0.5 tablets (5 mg total) by mouth daily.   No facility-administered encounter medications on file as of 09/22/2020.    ALLERGIES: No Known Allergies  VACCINATION STATUS: Immunization History  Administered Date(s) Administered  .  Fluad Quad(high Dose 65+) 10/08/2019, 05/16/2020  . Influenza, High Dose Seasonal PF 05/25/2017, 05/24/2018  . Influenza,inj,Quad PF,6+ Mos 06/20/2013, 05/30/2014, 06/09/2015, 06/10/2016  . Moderna Sars-Covid-2 Vaccination 11/22/2019, 12/25/2019  . Pneumococcal Conjugate-13 03/07/2015  . Pneumococcal Polysaccharide-23 04/26/2011  . Td 04/26/2011  . Tdap 04/26/2011  . Zoster 04/16/2013     HPI  CLARIE CAMEY is 80 y.o. female who presents today with repeat thyroid function tests.  She was started on low-dose methimazole for symptomatic subclinical hyperthyroidism.  Her daughter is accompanying her today.  PCP: Loman Brooklyn, FNP.  - Daughter reports she is fatigued and  has no trouble sleeping.  She denies palpitations, tremors, nor heat/cold intolerance.  Her previsit thyroid function tests on last visit is consistent with appropriate response to methimazole treatment.  However, this time, her TSH is more suppressed than her initial visit.  Prior to her last visit, she reports that she has lost approximately 30 pounds over the last 3 years.  She has lost 3 lbs since last visit. she denies dysphagia, choking, shortness of breath, no recent voice change.    she denies family history of thyroid dysfunction, but denies family hx of thyroid cancer. she denies personal history of goiter. she is not on any anti-thyroid medications nor on any thyroid hormone supplements.  Review of systems  Constitutional: + decreasing weight, + fatigue, no subjective hyperthermia Eyes: no blurry vision, no xerophthalmia ENT: no sore throat, no nodules palpated in throat, no dysphagia/odynophagia, nor hoarseness Cardiovascular: no Chest Pain, no Shortness of Breath, no palpitations, no leg swelling Respiratory: no cough, no SOB Gastrointestinal: no Nausea, no Vomiting, no Diarrhea Musculoskeletal: no muscle/joint aches Skin: no rashes Neurological: no tremors, no numbness, no tingling, no  dizziness Psychiatric: no depression, no anxiety   Objective:    BP (!) 175/76 (BP Location: Left Arm, Patient Position: Sitting)   Pulse (!) 110   Ht 5\' 6"  (1.676 m)   Wt 165 lb 3.2 oz (74.9 kg)   BMI 26.66 kg/m   Wt Readings from Last 3 Encounters:  09/22/20 165 lb 3.2 oz (74.9 kg)  09/18/20 167 lb 9.6 oz (76 kg)  08/28/20 167 lb 15.9 oz (76.2 kg)    BP Readings from Last 3 Encounters:  09/22/20 (!) 175/76  09/18/20 (!) 169/81  09/04/20 137/68                        Physical Exam- Limited  Constitutional:  Body mass index is 26.66 kg/m. , not in acute distress, normal state of mind Eyes:  EOMI, no exophthalmos Neck: Supple Thyroid: + gross goiter Respiratory: Adequate breathing efforts Musculoskeletal: no gross deformities, walks with a walker, strength intact in all four extremities, no gross restriction of joint movements Skin:  no rashes, no hyperemia Neurological: no tremor with outstretched hands,    CMP     Component Value Date/Time   NA 144 09/18/2020 1647   K 3.5 09/18/2020 1647   CL 105 09/18/2020 1647   CO2 28 09/18/2020 1647   GLUCOSE 140 (H) 09/18/2020 1647   GLUCOSE 168 (H) 09/03/2020 1916   BUN 10 09/18/2020 1647   CREATININE 0.65 09/18/2020 1647   CREATININE 0.79 03/22/2013 1707   CALCIUM 8.8 09/18/2020 1647   PROT 6.8 09/18/2020 1647   ALBUMIN 3.5 (L) 09/18/2020 1647   AST 12 09/18/2020 1647   ALT 10 09/18/2020 1647   ALKPHOS 69 09/18/2020 1647   BILITOT 0.6 09/18/2020 1647   GFRNONAA 84 09/18/2020 1647   GFRNONAA 58 (L) 09/03/2020 1916   GFRNONAA 74 03/22/2013 1707   GFRAA 96 09/18/2020 1647   GFRAA 86 03/22/2013 1707     CBC    Component Value Date/Time   WBC 4.0 09/18/2020 1647   WBC 8.7 09/03/2020 1916   RBC 4.43 09/18/2020 1647   RBC 5.46 (H) 09/03/2020 1916   HGB 11.5 09/18/2020 1647   HCT 35.0 09/18/2020 1647   PLT 105 (L) 09/18/2020 1647   MCV 79 09/18/2020 1647   MCH 26.0 (L) 09/18/2020 1647   MCH 25.8 (L)  09/03/2020 1916  MCHC 32.9 09/18/2020 1647   MCHC 29.9 (L) 09/03/2020 1916   RDW 13.7 09/18/2020 1647   LYMPHSABS 0.8 09/18/2020 1647   MONOABS 0.4 08/31/2020 0757   EOSABS 0.0 09/18/2020 1647   BASOSABS 0.0 09/18/2020 1647     Diabetic Labs (most recent): Lab Results  Component Value Date   HGBA1C 5.8 (H) 08/29/2020   HGBA1C 6.2 05/16/2020   HGBA1C 6.4 01/09/2020    Lipid Panel     Component Value Date/Time   CHOL 172 10/08/2019 1146   CHOL 179 03/22/2013 1707   TRIG 62 08/26/2020 2045   TRIG 54 03/22/2013 1707   HDL 95 10/08/2019 1146   HDL 70 03/22/2013 1707   CHOLHDL 1.8 10/08/2019 1146   LDLCALC 69 10/08/2019 1146   LDLCALC 98 03/22/2013 1707   LABVLDL 8 10/08/2019 1146    Lab Results  Component Value Date   TSH 0.22 (L) 06/10/2020   TSH 0.14 (L) 03/13/2020   TSH 0.04 (L) 11/06/2019   TSH 0.072 (L) 10/08/2019   TSH 0.01 (A) 09/18/2019   TSH 0.472 02/21/2017   TSH 0.585 12/14/2015   TSH 0.366 (L) 06/21/2014   TSH 0.019 (L) 06/19/2013   TSH 0.058 (L) 03/22/2013   FREET4 1.2 06/10/2020   FREET4 1.1 03/13/2020   FREET4 1.3 11/06/2019   FREET4 1.27 06/19/2013   FREET4 1.29 09/13/2012     Thyroid uptake and scan on November 23, 2019 IMPRESSION: Normal 4 hour and 24 hour radio iodine uptakes. Nondiagnostic thyroid imaging, patient unable to lie flat for imaging. Consider ultrasound thyroid gland to assess thyroid morphology, if clinically indicated.   Assessment & Plan:   1. Subclinical hyperthyroidism -Her previsit thyroid function tests show inadequate response to low dose Methimazole.  However, patient denies any symptoms except for mild weight fluctuations despite good appetite.  She reports good compliance to the medication.  Her uptake and scan back in March of 2021 was normal therefore she was not considered for RAI treatment.   -She was given the option to increase her Methimazole vs repeating her uptake and scan to control her overactive  thyroid.  She opted to increase the Methimazole for now.  I discussed and increased her dose of Methimazole to 10 mg po daily with breakfast.  Will repeat thyroid function studies in 7 weeks to assess response to medication.  If she shows no improvement with medication alone, will consider repeating uptake and scan at that time.  2. Goiter  -Regarding her clinical goiter: Thyroid ultrasound on March 13, 2020 Confirms heterogeneous thyroid parenchyma with  no discrete nodules.   -Patient is advised to maintain close follow up with Loman Brooklyn, FNP for primary care needs.      - Time spent on this patient care encounter:  20 minutes of which 50% was spent in  counseling and the rest reviewing  her current and  previous labs / studies and medications  doses and developing a plan for long term care. Sonya Oliver  participated in the discussions, expressed understanding, and voiced agreement with the above plans.  All questions were answered to her satisfaction. she is encouraged to contact clinic should she have any questions or concerns prior to her return visit.  Follow up plan: Return in about 8 weeks (around 11/17/2020) for Thyroid follow up, Previsit labs.    Rayetta Pigg, [facility] [address] [place], [place] [address] Phone: [phone] Fax: [phone]  09/22/2020, 3:01 PM

## 2020-09-25 ENCOUNTER — Ambulatory Visit: Payer: Medicare Other | Admitting: "Endocrinology

## 2020-10-05 ENCOUNTER — Other Ambulatory Visit: Payer: Self-pay | Admitting: Family Medicine

## 2020-10-05 DIAGNOSIS — J441 Chronic obstructive pulmonary disease with (acute) exacerbation: Secondary | ICD-10-CM

## 2020-10-07 ENCOUNTER — Other Ambulatory Visit: Payer: Self-pay | Admitting: Family Medicine

## 2020-10-07 DIAGNOSIS — M810 Age-related osteoporosis without current pathological fracture: Secondary | ICD-10-CM

## 2020-10-22 ENCOUNTER — Emergency Department (HOSPITAL_COMMUNITY): Payer: Medicare Other

## 2020-10-22 ENCOUNTER — Other Ambulatory Visit: Payer: Self-pay

## 2020-10-22 ENCOUNTER — Inpatient Hospital Stay (HOSPITAL_COMMUNITY)
Admission: EM | Admit: 2020-10-22 | Discharge: 2020-10-27 | DRG: 689 | Disposition: A | Discharge status: Hospice | Payer: Medicare Other | Attending: Family Medicine | Admitting: Family Medicine

## 2020-10-22 ENCOUNTER — Observation Stay (HOSPITAL_COMMUNITY): Payer: Medicare Other

## 2020-10-22 ENCOUNTER — Encounter (HOSPITAL_COMMUNITY): Payer: Self-pay

## 2020-10-22 DIAGNOSIS — J9811 Atelectasis: Secondary | ICD-10-CM | POA: Diagnosis present

## 2020-10-22 DIAGNOSIS — Z7951 Long term (current) use of inhaled steroids: Secondary | ICD-10-CM

## 2020-10-22 DIAGNOSIS — J9611 Chronic respiratory failure with hypoxia: Secondary | ICD-10-CM | POA: Diagnosis present

## 2020-10-22 DIAGNOSIS — Z90722 Acquired absence of ovaries, bilateral: Secondary | ICD-10-CM

## 2020-10-22 DIAGNOSIS — N390 Urinary tract infection, site not specified: Secondary | ICD-10-CM

## 2020-10-22 DIAGNOSIS — Z833 Family history of diabetes mellitus: Secondary | ICD-10-CM

## 2020-10-22 DIAGNOSIS — Y92009 Unspecified place in unspecified non-institutional (private) residence as the place of occurrence of the external cause: Secondary | ICD-10-CM

## 2020-10-22 DIAGNOSIS — E119 Type 2 diabetes mellitus without complications: Secondary | ICD-10-CM

## 2020-10-22 DIAGNOSIS — Z8673 Personal history of transient ischemic attack (TIA), and cerebral infarction without residual deficits: Secondary | ICD-10-CM

## 2020-10-22 DIAGNOSIS — Z66 Do not resuscitate: Secondary | ICD-10-CM | POA: Diagnosis present

## 2020-10-22 DIAGNOSIS — E0590 Thyrotoxicosis, unspecified without thyrotoxic crisis or storm: Secondary | ICD-10-CM | POA: Diagnosis present

## 2020-10-22 DIAGNOSIS — M79606 Pain in leg, unspecified: Secondary | ICD-10-CM

## 2020-10-22 DIAGNOSIS — Z79899 Other long term (current) drug therapy: Secondary | ICD-10-CM

## 2020-10-22 DIAGNOSIS — Z87891 Personal history of nicotine dependence: Secondary | ICD-10-CM

## 2020-10-22 DIAGNOSIS — R600 Localized edema: Secondary | ICD-10-CM | POA: Diagnosis present

## 2020-10-22 DIAGNOSIS — Z825 Family history of asthma and other chronic lower respiratory diseases: Secondary | ICD-10-CM

## 2020-10-22 DIAGNOSIS — G9341 Metabolic encephalopathy: Secondary | ICD-10-CM | POA: Diagnosis present

## 2020-10-22 DIAGNOSIS — M810 Age-related osteoporosis without current pathological fracture: Secondary | ICD-10-CM | POA: Diagnosis present

## 2020-10-22 DIAGNOSIS — N3000 Acute cystitis without hematuria: Secondary | ICD-10-CM | POA: Diagnosis not present

## 2020-10-22 DIAGNOSIS — R531 Weakness: Secondary | ICD-10-CM

## 2020-10-22 DIAGNOSIS — E11649 Type 2 diabetes mellitus with hypoglycemia without coma: Secondary | ICD-10-CM | POA: Diagnosis not present

## 2020-10-22 DIAGNOSIS — Z6826 Body mass index (BMI) 26.0-26.9, adult: Secondary | ICD-10-CM

## 2020-10-22 DIAGNOSIS — Z7984 Long term (current) use of oral hypoglycemic drugs: Secondary | ICD-10-CM

## 2020-10-22 DIAGNOSIS — Z7983 Long term (current) use of bisphosphonates: Secondary | ICD-10-CM

## 2020-10-22 DIAGNOSIS — Z20822 Contact with and (suspected) exposure to covid-19: Secondary | ICD-10-CM | POA: Diagnosis present

## 2020-10-22 DIAGNOSIS — Z9981 Dependence on supplemental oxygen: Secondary | ICD-10-CM

## 2020-10-22 DIAGNOSIS — I10 Essential (primary) hypertension: Secondary | ICD-10-CM | POA: Diagnosis present

## 2020-10-22 DIAGNOSIS — W19XXXA Unspecified fall, initial encounter: Secondary | ICD-10-CM | POA: Diagnosis not present

## 2020-10-22 DIAGNOSIS — M7989 Other specified soft tissue disorders: Secondary | ICD-10-CM | POA: Diagnosis present

## 2020-10-22 DIAGNOSIS — Z8551 Personal history of malignant neoplasm of bladder: Secondary | ICD-10-CM

## 2020-10-22 DIAGNOSIS — J449 Chronic obstructive pulmonary disease, unspecified: Secondary | ICD-10-CM | POA: Diagnosis present

## 2020-10-22 DIAGNOSIS — I5032 Chronic diastolic (congestive) heart failure: Secondary | ICD-10-CM | POA: Diagnosis not present

## 2020-10-22 DIAGNOSIS — R4182 Altered mental status, unspecified: Secondary | ICD-10-CM

## 2020-10-22 DIAGNOSIS — Z823 Family history of stroke: Secondary | ICD-10-CM

## 2020-10-22 DIAGNOSIS — U071 COVID-19: Secondary | ICD-10-CM | POA: Diagnosis not present

## 2020-10-22 DIAGNOSIS — Z9114 Patient's other noncompliance with medication regimen: Secondary | ICD-10-CM

## 2020-10-22 DIAGNOSIS — Z90710 Acquired absence of both cervix and uterus: Secondary | ICD-10-CM

## 2020-10-22 DIAGNOSIS — Z515 Encounter for palliative care: Secondary | ICD-10-CM

## 2020-10-22 DIAGNOSIS — Z8616 Personal history of COVID-19: Secondary | ICD-10-CM

## 2020-10-22 DIAGNOSIS — E876 Hypokalemia: Secondary | ICD-10-CM | POA: Diagnosis present

## 2020-10-22 DIAGNOSIS — M1990 Unspecified osteoarthritis, unspecified site: Secondary | ICD-10-CM | POA: Diagnosis present

## 2020-10-22 DIAGNOSIS — C3432 Malignant neoplasm of lower lobe, left bronchus or lung: Secondary | ICD-10-CM | POA: Diagnosis present

## 2020-10-22 DIAGNOSIS — Z9079 Acquired absence of other genital organ(s): Secondary | ICD-10-CM

## 2020-10-22 DIAGNOSIS — F0391 Unspecified dementia with behavioral disturbance: Secondary | ICD-10-CM | POA: Diagnosis present

## 2020-10-22 DIAGNOSIS — I700 Atherosclerosis of aorta: Secondary | ICD-10-CM | POA: Diagnosis present

## 2020-10-22 DIAGNOSIS — Z8249 Family history of ischemic heart disease and other diseases of the circulatory system: Secondary | ICD-10-CM

## 2020-10-22 DIAGNOSIS — R918 Other nonspecific abnormal finding of lung field: Principal | ICD-10-CM

## 2020-10-22 DIAGNOSIS — R627 Adult failure to thrive: Secondary | ICD-10-CM | POA: Diagnosis present

## 2020-10-22 DIAGNOSIS — I110 Hypertensive heart disease with heart failure: Secondary | ICD-10-CM | POA: Diagnosis present

## 2020-10-22 LAB — RESP PANEL BY RT-PCR (FLU A&B, COVID) ARPGX2
Influenza A by PCR: NEGATIVE
Influenza B by PCR: NEGATIVE
SARS Coronavirus 2 by RT PCR: NEGATIVE

## 2020-10-22 LAB — URINALYSIS, ROUTINE W REFLEX MICROSCOPIC
Bilirubin Urine: NEGATIVE
Glucose, UA: NEGATIVE mg/dL
Hgb urine dipstick: NEGATIVE
Ketones, ur: 5 mg/dL — AB
Nitrite: NEGATIVE
Protein, ur: NEGATIVE mg/dL
Specific Gravity, Urine: 1.011 (ref 1.005–1.030)
pH: 5 (ref 5.0–8.0)

## 2020-10-22 LAB — CBC WITH DIFFERENTIAL/PLATELET
Abs Immature Granulocytes: 0.02 10*3/uL (ref 0.00–0.07)
Basophils Absolute: 0 10*3/uL (ref 0.0–0.1)
Basophils Relative: 0 %
Eosinophils Absolute: 0 10*3/uL (ref 0.0–0.5)
Eosinophils Relative: 0 %
HCT: 37.4 % (ref 36.0–46.0)
Hemoglobin: 11.1 g/dL — ABNORMAL LOW (ref 12.0–15.0)
Immature Granulocytes: 0 %
Lymphocytes Relative: 13 %
Lymphs Abs: 0.7 10*3/uL (ref 0.7–4.0)
MCH: 26.1 pg (ref 26.0–34.0)
MCHC: 29.7 g/dL — ABNORMAL LOW (ref 30.0–36.0)
MCV: 87.8 fL (ref 80.0–100.0)
Monocytes Absolute: 0.5 10*3/uL (ref 0.1–1.0)
Monocytes Relative: 9 %
Neutro Abs: 4.2 10*3/uL (ref 1.7–7.7)
Neutrophils Relative %: 78 %
Platelets: 157 10*3/uL (ref 150–400)
RBC: 4.26 MIL/uL (ref 3.87–5.11)
RDW: 16.2 % — ABNORMAL HIGH (ref 11.5–15.5)
WBC: 5.5 10*3/uL (ref 4.0–10.5)
nRBC: 0 % (ref 0.0–0.2)

## 2020-10-22 LAB — COMPREHENSIVE METABOLIC PANEL
ALT: 14 U/L (ref 0–44)
AST: 24 U/L (ref 15–41)
Albumin: 2.9 g/dL — ABNORMAL LOW (ref 3.5–5.0)
Alkaline Phosphatase: 48 U/L (ref 38–126)
Anion gap: 14 (ref 5–15)
BUN: 15 mg/dL (ref 8–23)
CO2: 28 mmol/L (ref 22–32)
Calcium: 9 mg/dL (ref 8.9–10.3)
Chloride: 102 mmol/L (ref 98–111)
Creatinine, Ser: 0.76 mg/dL (ref 0.44–1.00)
GFR, Estimated: >60 mL/min (ref >60)
Glucose, Bld: 81 mg/dL (ref 70–99)
Potassium: 3.3 mmol/L — ABNORMAL LOW (ref 3.5–5.1)
Sodium: 144 mmol/L (ref 135–145)
Total Bilirubin: 1.2 mg/dL (ref 0.3–1.2)
Total Protein: 7.4 g/dL (ref 6.5–8.1)

## 2020-10-22 LAB — GLUCOSE, CAPILLARY: Glucose-Capillary: 129 mg/dL — ABNORMAL HIGH (ref 70–99)

## 2020-10-22 LAB — BRAIN NATRIURETIC PEPTIDE: B Natriuretic Peptide: 207 pg/mL — ABNORMAL HIGH (ref 0.0–100.0)

## 2020-10-22 MED ORDER — INSULIN ASPART 100 UNIT/ML ~~LOC~~ SOLN
0.0000 [IU] | Freq: Three times a day (TID) | SUBCUTANEOUS | Status: DC
  Administered 2020-10-23: 1 [IU] via SUBCUTANEOUS

## 2020-10-22 MED ORDER — ACETAMINOPHEN 650 MG RE SUPP: 650.0000 mg | Freq: Four times a day (QID) | RECTAL | Status: DC | PRN

## 2020-10-22 MED ORDER — ENOXAPARIN SODIUM 40 MG/0.4ML ~~LOC~~ SOLN
40.0000 mg | SUBCUTANEOUS | Status: DC
  Administered 2020-10-22 – 2020-10-26 (×5): 40 mg via SUBCUTANEOUS
  Filled 2020-10-22 (×5): qty 0.4

## 2020-10-22 MED ORDER — FUROSEMIDE 10 MG/ML IJ SOLN
80.0000 mg | Freq: Once | INTRAMUSCULAR | Status: AC
  Administered 2020-10-22: 80 mg via INTRAVENOUS
  Filled 2020-10-22: qty 8

## 2020-10-22 MED ORDER — CARVEDILOL 3.125 MG PO TABS
3.1250 mg | ORAL_TABLET | Freq: Two times a day (BID) | ORAL | Status: DC
  Administered 2020-10-23 – 2020-10-27 (×8): 3.125 mg via ORAL
  Filled 2020-10-22 (×10): qty 1

## 2020-10-22 MED ORDER — METHIMAZOLE 5 MG PO TABS
5.0000 mg | ORAL_TABLET | Freq: Every day | ORAL | Status: DC
Start: 2020-10-22 — End: 2020-10-27
  Administered 2020-10-23 – 2020-10-27 (×4): 5 mg via ORAL
  Filled 2020-10-22 (×5): qty 1

## 2020-10-22 MED ORDER — SODIUM CHLORIDE 0.9 % IV SOLN
1.0000 g | INTRAVENOUS | Status: AC
  Administered 2020-10-23 – 2020-10-24 (×2): 1 g via INTRAVENOUS
  Filled 2020-10-22 (×2): qty 10

## 2020-10-22 MED ORDER — AMLODIPINE BESYLATE 5 MG PO TABS
2.5000 mg | ORAL_TABLET | Freq: Every day | ORAL | Status: DC
  Administered 2020-10-23 – 2020-10-27 (×4): 2.5 mg via ORAL
  Filled 2020-10-22 (×5): qty 1

## 2020-10-22 MED ORDER — IOHEXOL 300 MG/ML  SOLN
75.0000 mL | Freq: Once | INTRAMUSCULAR | Status: AC | PRN
  Administered 2020-10-22: 75 mL via INTRAVENOUS

## 2020-10-22 MED ORDER — POLYETHYLENE GLYCOL 3350 17 G PO PACK: 17.0000 g | PACK | Freq: Every day | ORAL | Status: DC | PRN

## 2020-10-22 MED ORDER — POTASSIUM CHLORIDE 20 MEQ PO PACK
40.0000 meq | PACK | ORAL | Status: AC
  Administered 2020-10-22: 40 meq via ORAL
  Filled 2020-10-22: qty 2

## 2020-10-22 MED ORDER — IPRATROPIUM-ALBUTEROL 0.5-2.5 (3) MG/3ML IN SOLN
3.0000 mL | RESPIRATORY_TRACT | Status: DC | PRN
  Administered 2020-10-23: 3 mL via RESPIRATORY_TRACT
  Filled 2020-10-22: qty 3

## 2020-10-22 MED ORDER — INSULIN ASPART 100 UNIT/ML ~~LOC~~ SOLN: 0.0000 [IU] | Freq: Every day | SUBCUTANEOUS | Status: DC

## 2020-10-22 MED ORDER — ACETAMINOPHEN 325 MG PO TABS: 650.0000 mg | ORAL_TABLET | Freq: Four times a day (QID) | ORAL | Status: DC | PRN

## 2020-10-22 MED ORDER — ATORVASTATIN CALCIUM 10 MG PO TABS
10.0000 mg | ORAL_TABLET | Freq: Every day | ORAL | Status: DC
Start: 2020-10-22 — End: 2020-10-27
  Administered 2020-10-23 – 2020-10-27 (×4): 10 mg via ORAL
  Filled 2020-10-22 (×5): qty 1

## 2020-10-22 MED ORDER — MOMETASONE FURO-FORMOTEROL FUM 100-5 MCG/ACT IN AERO
2.0000 | INHALATION_SPRAY | Freq: Two times a day (BID) | RESPIRATORY_TRACT | Status: DC
  Administered 2020-10-22 – 2020-10-27 (×8): 2 via RESPIRATORY_TRACT
  Filled 2020-10-22: qty 8.8

## 2020-10-22 MED ORDER — SODIUM CHLORIDE 0.9 % IV SOLN
1.0000 g | Freq: Once | INTRAVENOUS | Status: AC
  Administered 2020-10-22: 1 g via INTRAVENOUS
  Filled 2020-10-22: qty 10

## 2020-10-22 NOTE — H&P (Signed)
History and Physical    Sonya Oliver UMP:[id number] DOB: [birth year] DOA: 10/22/2020  PCP: Sonya Brooklyn, FNP   Patient coming from: Home  I have personally briefly reviewed patient's old medical records in [place]  Chief Complaint: Leg Swelling  HPI: Sonya Oliver is a 80 y.o. female with medical history significant for COPD, diabetes mellitus, diastolic CHF, [id number] pneumonia. Patient was brought to the ED via EMS with reports of weakness and leg swelling, patient also was not talking or feeding herself.  Patient had a fall 2 days ago, but patient declined coming to the ED.  At the time of my evaluation patient is awake, history is limited, patient answers some questions accurately but at times her speech is tangential/confused. She reports she fell on her left side, and tells me she is having severe pain on her left knee and left ankle.  She denies chest pain, denies difficulty breathing. I talked to patient's daughter Sonya Oliver, and reports she gives me conflicts with ED providers history obtained from patient's son.  Patient lives with patient's son.  But patient's daughter Sonya Oliver is designated healthcare power of attorney.  Patient's daughter tells me that patient fell 2 days ago, but since then she has been able to ambulate with a walker, she has been able to feed herself.  She reports history of memory problems and diagnosis of dementia about 2 months ago. She tells me patient speech is at times confused, saying and seeing things that are not actually there, but this is her baseline. Patient is on O2 at baseline, but patient uses it as needed sometimes in the morning and sometimes in the afternoon, and patient sometimes has low oxygen saturations at home. She reports chronic lower extremity swelling, fluctuates in severity, she feels patient's legs are more swollen now but she is unable to tell duration.  She reports patient is noncompliant with her Lasix.  ED Course:  Temperature 98.2.  Heart rate 60s to 90s.  Respiratory rate 18-26.  Blood pressure systolic 195-093.  O2 sats 86% on room air, currently on 2 L nasal cannula.  Potassium 3.3, stable creatinine 0.76.  UA with moderate leukocytes rare bacteria 11-20 WBCs.  BNP 207.  Chest CT shows irregular mass superior left lower lobe with focus of central necrosis consistent with neoplastic lesion.  Pulmonology/ cardiothoracic surgery consult advised.  Atelectasis R > L.  Enlarged but stable thyroid masses. 80 mg IV Lasix given.  IV ceftriaxone started for UTI.  Hospitalist to admit for AMS.  Review of Systems: As per HPI all other systems reviewed and negative.  Past Medical History:  Diagnosis Date  . Arthritis   . Bladder cancer ([place]) 2015  . Cataract   . CHF (congestive heart failure) ([place])    NO CARDIOLOGIST---  MONITORED BY PCP DR Jacelyn Grip  . COPD with emphysema ([place])    last Acute Exacerbation 08-21-2014  . H/O pulmonary edema    jan 2014--  acute CHF w/ pulmonary edema  . Hypertension   . Hyperthyroidism    currently no meds -- labs stable  . Nocturia   . Nocturnal oxygen desaturation    uses O2  HS via [place]--  and PRN daytime  . Osteoporosis   . Stroke ([place])   . Type 2 diabetes mellitus ([place])   . Ureteral tumor     Past Surgical History:  Procedure Laterality Date  . ABDOMINAL HYSTERECTOMY  1980'S   W/ BILATERAL SALPINGO-OOPHORECTOMY  . CATARACT EXTRACTION  W/PHACO Left 01/31/2014   Procedure: CATARACT EXTRACTION PHACO AND INTRAOCULAR LENS PLACEMENT (IOC);  Surgeon: Tonny Branch, MD;  Location: [facility];  Service: Ophthalmology;  Laterality: Left;  CDE: 23.78  . CATARACT EXTRACTION W/PHACO Right 02/28/2014   Procedure: CATARACT EXTRACTION PHACO AND INTRAOCULAR LENS PLACEMENT (IOC);  Surgeon: Tonny Branch, MD;  Location: [facility];  Service: Ophthalmology;  Laterality: Right;  CDE:  11.34  . CYSTOSCOPY  03/23/2012   Procedure: CYSTOSCOPY;  Surgeon: Claybon Jabs, MD;  Location: [facility];  Service: Urology;   Laterality: N/A;  . CYSTOSCOPY WITH RETROGRADE PYELOGRAM, URETEROSCOPY AND STENT PLACEMENT Right 10/21/2014   Procedure: CYSTOSCOPY WITH RETROGRADE PYELOGRAM, URETEROSCOPY, BLADDER TUMOR BIOPSY;  Surgeon: Claybon Jabs, MD;  Location: [facility];  Service: Urology;  Laterality: Right;  . TRANSTHORACIC ECHOCARDIOGRAM  06-21-2013   moderate LVH,  grade I diastolic dysfunction, ef 63-84%,  mild MR & TR  . TRANSURETHRAL RESECTION OF BLADDER TUMOR  03/23/2012   Procedure: TRANSURETHRAL RESECTION OF BLADDER TUMOR (TURBT);  Surgeon: Claybon Jabs, MD;  Location: [facility];  Service: Urology;  Laterality: N/A;  . TRANSURETHRAL RESECTION OF BLADDER TUMOR N/A 01/08/2013   Procedure: TRANSURETHRAL RESECTION OF BLADDER TUMOR (TURBT);  Surgeon: Claybon Jabs, MD;  Location: [facility];  Service: Urology;  Laterality: N/A;     reports that she quit smoking about 14 years ago. Her smoking use included cigarettes. She has a 12.50 pack-year smoking history. She has never used smokeless tobacco. She reports that she does not drink alcohol and does not use drugs.  No Known Allergies  Family History  Problem Relation Age of Onset  . Diabetes Father   . Congestive Heart Failure Father   . COPD Father   . Kidney disease Father   . Hypertension Father   . Diabetes Mother        with kidney disease  . Kidney disease Mother   . Hypertension Mother   . Hyperlipidemia Mother   . Heart attack Mother   . Stroke Mother   . Heart attack Brother   . Asthma Brother   . Diabetes Daughter   . Diabetes Son   . Cancer Brother        throat  . Hypertension Brother     Prior to Admission medications   Medication Sig Start Date End Date Taking? Authorizing Provider  albuterol (VENTOLIN HFA) 108 (90 Base) MCG/ACT inhaler INHALE 1-2 PUFFS BY MOUTH EVERY 6 HOURS AS NEEDED FOR WHEEZE OR SHORTNESS OF BREATH Patient taking differently: Inhale 1-2 puffs into the lungs every 6 (six) hours as  needed for wheezing or shortness of breath. 10/06/20  Yes Hendricks Limes F, FNP  carvedilol (COREG) 3.125 MG tablet Take 1 tablet (3.125 mg total) by mouth 2 (two) times daily. 08/25/20  Yes Claretta Fraise, MD  furosemide (LASIX) 40 MG tablet Take 1 tablet (40 mg total) by mouth daily. 09/18/20  Yes Hendricks Limes F, FNP  potassium chloride SA (KLOR-CON) 20 MEQ tablet Take 1 tablet (20 mEq total) by mouth daily. 08/25/20  Yes Claretta Fraise, MD  albuterol (PROVENTIL) (2.5 MG/3ML) 0.083% nebulizer solution Inhale 3 mLs into the lungs 4 (four) times daily as needed for shortness of breath. 02/19/19   Terald Sleeper, PA-C  alendronate (FOSAMAX) 70 MG tablet TAKE 1 TABLET ONCE A WEEK. TAKE WITH A FULL GLASS OF WATER ON AN EMPTY STOMACH. 10/07/20   Sonya Brooklyn, FNP  amLODipine (NORVASC) 2.5 MG tablet  Take 1 tablet (2.5 mg total) by mouth daily. 09/18/20   Sonya Brooklyn, FNP  atorvastatin (LIPITOR) 10 MG tablet TAKE 1 TABLET BY MOUTH EVERY DAY Patient taking differently: Take 10 mg by mouth daily. 08/25/20   Claretta Fraise, MD  Cholecalciferol (VITAMIN D3) 2000 UNITS TABS Take 1 tablet by mouth daily.    [provider]  cycloSPORINE (RESTASIS MULTIDOSE) 0.05 % ophthalmic emulsion Place 1 drop into both eyes 2 (two) times daily. 04/23/20   Sonya Brooklyn, FNP  Fluticasone-Salmeterol (ADVAIR DISKUS) 100-50 MCG/DOSE AEPB Inhale 1 puff into the lungs 2 (two) times daily. 05/16/20   Sonya Brooklyn, FNP  guaiFENesin-dextromethorphan (ROBITUSSIN DM) 100-10 MG/5ML syrup Take 10 mLs by mouth every 4 (four) hours as needed for cough. 09/01/20   Manuella Ghazi, Pratik D, DO  lisinopril (ZESTRIL) 40 MG tablet Take 1 tablet (40 mg total) by mouth daily. 08/07/20   Gwenlyn Perking, FNP  metFORMIN (GLUCOPHAGE) 500 MG tablet Take 1 tablet (500 mg total) by mouth daily. 07/10/20   Gwenlyn Perking, FNP  methimazole (TAPAZOLE) 10 MG tablet Take 0.5 tablets (5 mg total) by mouth daily. 09/22/20   Brita Romp, NP   OXYGEN Inhale 2 L into the lungs as needed.    [provider]  pantoprazole (PROTONIX) 40 MG tablet Take 1 tablet (40 mg total) by mouth daily for 10 days. 09/18/20 09/28/20  Sonya Brooklyn, FNP    Physical Exam: Vitals:   10/22/20 1400 10/22/20 1430 10/22/20 1500 10/22/20 1534  BP: (!) 124/58 (!) 107/51 (!) 102/52 (!) 118/55  Pulse: 90 90 93 88  Resp: 19 16 15  (!) 22  Temp:      TempSrc:      SpO2: 98% 100% 100% 97%  Weight:      Height:        Constitutional: NAD, calm, comfortable Vitals:   10/22/20 1400 10/22/20 1430 10/22/20 1500 10/22/20 1534  BP: (!) 124/58 (!) 107/51 (!) 102/52 (!) 118/55  Pulse: 90 90 93 88  Resp: 19 16 15  (!) 22  Temp:      TempSrc:      SpO2: 98% 100% 100% 97%  Weight:      Height:       Eyes: PERRL, lids and conjunctivae normal ENMT: Mucous membranes are very dry Neck: normal, supple, no masses, no thyromegaly Respiratory: clear to auscultation bilaterally, no wheezing, no crackles. Normal respiratory effort. No accessory muscle use.  Cardiovascular: Regular rate and rhythm, no murmurs / rubs / gallops.  Bilateral lower extremity appear large, but there is no significant for appreciable pitting, bilateral lower extremities warm and well perfused.  Abdomen: no tenderness, no masses palpated. No hepatosplenomegaly. Bowel sounds positive.  Musculoskeletal: no clubbing / cyanosis. No joint deformity upper and lower extremities.  Unable to move left lower extremity due to pain, tenderness appreciated ankle and knee. Skin: no rashes, lesions, ulcers. No induration Neurologic: No apparent cranial nerve abnormality, 4+5 strength bilateral upper extremity, able to move right lower extremity against gravity, barely able to move left lower extremity. Psychiatric: Alert and oriented to person and place.  Able to answer some questions, speech a bit tangential versus slightly confused  Labs on Admission: I have personally reviewed following labs and  imaging studies  CBC: Recent Labs  Lab 10/22/20 1032  WBC 5.5  NEUTROABS 4.2  HGB 11.1*  HCT 37.4  MCV 87.8  PLT 675   Basic Metabolic Panel: Recent Labs  Lab  10/22/20 1032  NA 144  K 3.3*  CL 102  CO2 28  GLUCOSE 81  BUN 15  CREATININE 0.76  CALCIUM 9.0   Liver Function Tests: Recent Labs  Lab 10/22/20 1032  AST 24  ALT 14  ALKPHOS 48  BILITOT 1.2  PROT 7.4  ALBUMIN 2.9*   Urine analysis:    Component Value Date/Time   COLORURINE YELLOW 10/22/2020 1035   APPEARANCEUR HAZY (A) 10/22/2020 1035   APPEARANCEUR Clear 02/20/2019 1058   LABSPEC 1.011 10/22/2020 1035   PHURINE 5.0 10/22/2020 1035   GLUCOSEU NEGATIVE 10/22/2020 1035   HGBUR NEGATIVE 10/22/2020 1035   [place] 10/22/2020 1035   BILIRUBINUR Negative 02/20/2019 1058   KETONESUR 5 (A) 10/22/2020 1035   PROTEINUR NEGATIVE 10/22/2020 1035   UROBILINOGEN 1.0 03/21/2012 1936   NITRITE NEGATIVE 10/22/2020 1035   LEUKOCYTESUR MODERATE (A) 10/22/2020 1035    Radiological Exams on Admission: CT Head Wo Contrast  Result Date: 10/22/2020 CLINICAL DATA:  Head trauma, minor. Poly trauma, critical, head/cervical spine injury suspected. EXAM: CT HEAD WITHOUT CONTRAST CT CERVICAL SPINE WITHOUT CONTRAST TECHNIQUE: Multidetector CT imaging of the head and cervical spine was performed following the standard protocol without intravenous contrast. Multiplanar CT image reconstructions of the cervical spine were also generated. COMPARISON:  Head CT 08/15/2020. FINDINGS: CT HEAD FINDINGS Brain: Mildly motion degraded exam. Stable cerebral and cerebellar atrophy. Moderate ill-defined hypoattenuation within the cerebral white matter is nonspecific, but compatible with chronic small vessel ischemic disease. There is no acute intracranial hemorrhage. No demarcated cortical infarct. No extra-axial fluid collection. No evidence of intracranial mass. No midline shift. Vascular: No hyperdense vessel.  Atherosclerotic  calcifications. Skull: Normal. Negative for fracture or focal lesion. Sinuses/Orbits: Visualized orbits show no acute finding. No significant paranasal sinus disease at the imaged levels. CT CERVICAL SPINE FINDINGS Alignment: Straightening of the expected cervical lordosis. No significant spondylolisthesis. Skull base and vertebrae: The basion-dental and atlanto-dental intervals are maintained.No evidence of acute fracture to the cervical spine. Soft tissues and spinal canal: No prevertebral fluid or swelling. No visible canal hematoma. Disc levels: Cervical spondylosis with multilevel disc space narrowing, disc bulges, posterior disc osteophytes, uncovertebral hypertrophy and facet arthrosis. Disc space narrowing is severe at C3-C4, C4-C5 and moderate/severe at C5-C6. Disc bulges and posterior disc osteophytes contribute to suspected severe spinal canal stenosis at C3-C4, C4-C5 and C5-C6. Multilevel bony neural foraminal narrowing. Upper chest: Emphysema. No consolidation within the imaged lung apices. No visible pneumothorax. Other: Motion artifact limits evaluation of the thyroid gland. Thyromegaly with right thyroid lobe calcifications. IMPRESSION: CT head: 1. Mildly motion degraded exam. 2. No evidence of acute intracranial abnormality. 3. Stable generalized atrophy of the brain and chronic small vessel ischemic disease. CT cervical spine: 1. Mildly motion degraded exam. 2. No evidence of acute fracture to the cervical spine. 3. Nonspecific straightening of the expected cervical lordosis. 4. Advanced cervical spondylosis as described. Suspected severe spinal canal stenosis at C3-C4, C4-C5 and C5-C6. 5. Thyromegaly with right thyroid lobe calcifications. Please refer to prior thyroid ultrasound 03/13/2020 for further description of the gland. 6.  Emphysema (ICD10-J43.9). Electronically Signed   By: Kellie Simmering DO   On: 10/22/2020 12:25   CT Chest W Contrast  Result Date: 10/22/2020 CLINICAL DATA:  Mass seen  on previous CT EXAM: CT CHEST WITH CONTRAST TECHNIQUE: Multidetector CT imaging of the chest was performed during intravenous contrast administration. CONTRAST:  53mL OMNIPAQUE IOHEXOL 300 MG/ML  SOLN COMPARISON:  Chest CT examinations December 14, 2015 and August 26, 2020; chest radiograph October 22, 2020 FINDINGS: Cardiovascular: The ascending thoracic aortic diameter measures 4.0 x 4.0 cm. No evident thoracic aortic dissection. There is extensive aortic atherosclerosis throughout multiple visualized great vessels. There is aortic atherosclerosis. There are multiple foci of coronary artery calcification. There is cardiomegaly. No pericardial effusion or pericardial thickening. No major vessel pulmonary embolus evident. Note that the timing bolus was not triggered to optimize pulmonary arterial vascular visualization. There is prominence of the main pulmonary outflow tract measuring 3.6 cm in diameter. Mediastinum/Nodes: The thyroid is again noted to be diffusely enlarged. There is extensive calcification in the enlarged right lobe with a mass occupying much of the right lobe, unchanged from the 2017 study. Thyroid appears stable for essentially a 5 year time span which is indicative of benign etiology and does not warrant additional imaging surveillance unless clinical symptoms so warrant. No evident thoracic adenopathy.  No esophageal lesions are evident. Lungs/Pleura: There is an irregular mass in the superior segment of the left lower lobe, similar in appearance compared to prior CT examination approximately 2 months prior. This mass measures 3.3 x 2.7 cm. There are areas of decreased attenuation centrally, likely representing a degree of central necrosis within this apparent neoplasm. There is atelectatic change in the lung bases, stable. There is underlying centrilobular and paraseptal emphysematous change. No pleural effusions. No pneumothorax. Trachea and major bronchial structures appear normal. Upper  Abdomen: There is upper abdominal aortic atherosclerosis. There is hepatic steatosis. Left adrenal hypertrophy is incompletely visualized, stable in appearance. Musculoskeletal: There are foci of degenerative change in the thoracic spine. No blastic or lytic bone lesions. No chest wall lesions. IMPRESSION: 1. Slightly irregular mass in the superior segment of the left lower lobe with apparent focus of central necrosis. Appearance consistent with neoplastic lesion. Consultation with pulmonary medicine or thoracic surgery advised. Tissue sampling may well be warranted given the appearance of this lesion. 2. Atelectatic change in the lung bases, more severe on the right than on the left. Underlying emphysematous changes. 3.  No appreciable adenopathy. 4. Ascending thoracic aorta measures 4.0 x 4.0 cm in diameter. Recommend annual imaging followup by CTA or MRA. This recommendation follows 2010 ACCF/AHA/AATS/ACR/ASA/SCA/SCAI/SIR/STS/SVM Guidelines for the Diagnosis and Management of Patients with Thoracic Aortic Disease. Circulation. 2010; 121: B716-R678. Aortic aneurysm NOS (ICD10-I71.9). No dissection evident. Aortic atherosclerosis noted. There are foci of calcification great vessels and coronary arteries. 5. Stable enlarged thyroid with masses, primarily in the right lobe. Stability has been documented for essentially 5 year time span. Additional imaging surveillance not warranted per consensus guidelines. 6.  Hepatic steatosis. Aortic Atherosclerosis (ICD10-I70.0) and Emphysema (ICD10-J43.9). Electronically Signed   By: Lowella Grip III M.D.   On: 10/22/2020 14:31   CT Cervical Spine Wo Contrast  Result Date: 10/22/2020 CLINICAL DATA:  Head trauma, minor. Poly trauma, critical, head/cervical spine injury suspected. EXAM: CT HEAD WITHOUT CONTRAST CT CERVICAL SPINE WITHOUT CONTRAST TECHNIQUE: Multidetector CT imaging of the head and cervical spine was performed following the standard protocol without  intravenous contrast. Multiplanar CT image reconstructions of the cervical spine were also generated. COMPARISON:  Head CT 08/15/2020. FINDINGS: CT HEAD FINDINGS Brain: Mildly motion degraded exam. Stable cerebral and cerebellar atrophy. Moderate ill-defined hypoattenuation within the cerebral white matter is nonspecific, but compatible with chronic small vessel ischemic disease. There is no acute intracranial hemorrhage. No demarcated cortical infarct. No extra-axial fluid collection. No evidence of intracranial mass. No midline shift. Vascular: No hyperdense vessel.  Atherosclerotic calcifications. Skull: Normal. Negative for fracture or focal lesion. Sinuses/Orbits: Visualized orbits show no acute finding. No significant paranasal sinus disease at the imaged levels. CT CERVICAL SPINE FINDINGS Alignment: Straightening of the expected cervical lordosis. No significant spondylolisthesis. Skull base and vertebrae: The basion-dental and atlanto-dental intervals are maintained.No evidence of acute fracture to the cervical spine. Soft tissues and spinal canal: No prevertebral fluid or swelling. No visible canal hematoma. Disc levels: Cervical spondylosis with multilevel disc space narrowing, disc bulges, posterior disc osteophytes, uncovertebral hypertrophy and facet arthrosis. Disc space narrowing is severe at C3-C4, C4-C5 and moderate/severe at C5-C6. Disc bulges and posterior disc osteophytes contribute to suspected severe spinal canal stenosis at C3-C4, C4-C5 and C5-C6. Multilevel bony neural foraminal narrowing. Upper chest: Emphysema. No consolidation within the imaged lung apices. No visible pneumothorax. Other: Motion artifact limits evaluation of the thyroid gland. Thyromegaly with right thyroid lobe calcifications. IMPRESSION: CT head: 1. Mildly motion degraded exam. 2. No evidence of acute intracranial abnormality. 3. Stable generalized atrophy of the brain and chronic small vessel ischemic disease. CT  cervical spine: 1. Mildly motion degraded exam. 2. No evidence of acute fracture to the cervical spine. 3. Nonspecific straightening of the expected cervical lordosis. 4. Advanced cervical spondylosis as described. Suspected severe spinal canal stenosis at C3-C4, C4-C5 and C5-C6. 5. Thyromegaly with right thyroid lobe calcifications. Please refer to prior thyroid ultrasound 03/13/2020 for further description of the gland. 6.  Emphysema (ICD10-J43.9). Electronically Signed   By: Kellie Simmering DO   On: 10/22/2020 12:25   CT Hip Left Wo Contrast  Result Date: 10/22/2020 CLINICAL DATA:  Left hip pain since a fall 10/16/2020. Initial encounter. EXAM: CT OF THE LEFT HIP WITHOUT CONTRAST TECHNIQUE: Multidetector CT imaging of the left hip was performed according to the standard protocol. Multiplanar CT image reconstructions were also generated. COMPARISON:  Plain films left hip today. FINDINGS: Bones/Joint/Cartilage There is no fracture, dislocation or other acute bony or joint abnormality. Mild degenerative change at the symphysis pubis, left hip and left SI joint noted. No avascular necrosis of the left femoral head. Joint space is preserved. Mild chondrocalcinosis noted. Ligaments Suboptimally assessed by CT. Muscles and Tendons Intact. Soft tissues No acute or focal abnormality. Infiltration of subcutaneous fat lateral to the left hip could be due to contusion. IMPRESSION: No acute bony or joint abnormality. Infiltration of subcutaneous fat lateral to the left hip may be due to soft tissue contusion. Electronically Signed   By: Inge Rise M.D.   On: 10/22/2020 13:58   DG Chest Port 1 View  Result Date: 10/22/2020 CLINICAL DATA:  Pain following fall EXAM: PORTABLE CHEST 1 VIEW COMPARISON:  Chest radiograph and chest CT August 26, 2020 FINDINGS: Underlying emphysematous changes again noted. There is mild right base atelectasis. The left lower lobe mass seen on most recent CT is not appreciable by  radiography. There is cardiomegaly with pulmonary vascularity within normal limits. No evident adenopathy. There is aortic atherosclerosis. Bones are osteoporotic. IMPRESSION: 1. The mass in the left lower lobe seen on prior CT is not appreciable on current radiographic examination. Given concern for potential neoplasm based on the appearance of this mass on prior CT, follow-up chest CT, ideally with intravenous contrast, at this time is advised to further evaluate. 2. No edema or airspace opacity is evident. There is underlying emphysematous change. There is mild right base atelectasis. 3.  Stable cardiomegaly. 4.  Aortic Atherosclerosis (ICD10-I70.0). Electronically Signed   By: Lowella Grip III  M.D.   On: 10/22/2020 11:15   DG HIPS BILAT WITH PELVIS MIN 5 VIEWS  Result Date: 10/22/2020 CLINICAL DATA:  Fall, left hip pain EXAM: DG HIP (WITH OR WITHOUT PELVIS) 5+V BILAT COMPARISON:  09/04/2020, 08/15/2020 FINDINGS: Osseous structures are diffusely demineralized. Possible cortical irregularity in the subcapital region of the proximal left femur, not well characterized given lack of frog-lateral view. Cross-table lateral view is limited by overlapping structures. Right hip joint appears intact without fracture or dislocation. Sacrum is largely obscured by overlying stool and bowel gas. No evidence of pelvic diastasis. IMPRESSION: Possible cortical irregularity in the subcapital region of the proximal left femur, not well characterized. A CT scan of the left hip is recommended for further evaluation. Electronically Signed   By: Davina Poke D.O.   On: 10/22/2020 12:03    EKG: Independently reviewed.  Sinus tachycardia rate 111, QTc 544.  Rhythm irregular from occasional PACs.  Assessment/Plan Principal Problem:   Fall at home, initial encounter Active Problems:   Edema leg   COPD (chronic obstructive pulmonary disease) ([place])   Essential hypertension   Diabetes mellitus, type 2 ([facility])   Chronic  diastolic CHF (congestive heart failure) ([facility])   Mass of lower lobe of left lung   Chronic respiratory failure with hypoxia ([place])   Acute lower UTI   Fall, urinary tract infection - UA with moderate leukocytes .  Rules out for sepsis.  WBC 5.5.  No recent urine cultures.  Ambulates with a walker.  Head, cervical and left hip CT without acute abnormality. -IV ceftriaxone 1 g daily -Follow-up urine cultures -Left lower extremity knee and ankle x-rays. - PT eval  Diastolic CHF, lower extremity swelling- BNP elevated 207, per charts weight stable/decreased over the past 6 months.  ? Acuity of lower extremity swelling, she has a component of chronic edema.  Last echo 2018 shows EF of 60 to 65%, G1DD.  Chest CT negative for edema or effusions. -IV Lasix 80 mg x 1 given in ED, hold off on further Lasix dosing, pending clinical course -Strict input output Daily weights -N.p.o. in the morning  Memory problems, possible dementia-initial reports of change in mental status, but in talking to patient's daughter this is patient's baseline, consistent with prior documentation during hospitalization.  COPD with Chronic respiratory failure-O2 sats 86% on room air, currently on 2 L nasal cannula 96%. No wheezing on exam.,  On home O2, uses it intermittently. - Duonebs. - resume home inhalers  Hypokalemia mild potassium 3.3. - Replete  Known Left lung mass-CT today showing irregular mass left lower lobe with apparent focus of central necrosis.  Consistent with neoplastic lesion.  Was to follow up with pulmonology, has an appointment with Dr. Melvyn Novas 2/25 as outpt.  - Pls consult pulmonology, consult order placed.  Diabetes mellitus- random glucose 81. Hgba1c 5.8. - A1c reflecting tight control, consider d/c metformin on d/c  HTN-blood pressure soft stable.  Systolic down to 417. - hold norvasc 2.5 for now - also Hold lisinopril with contrast exposure. -Resume carvedilol 3.125mg  twice daily  DVT  prophylaxis: Lovenox Code Status: Full code. Previous documentation states DNR, daughter confirms patient is full code currently. Family Communication: Daughter on the phone- Sonya Oliver, who is HCPOA Disposition Plan: ~1 - 2 days. Consults called: None. Admission status: obs,    Bethena Roys MD [facility]  10/22/2020, 7:52 PM

## 2020-10-22 NOTE — Progress Notes (Addendum)
Central telemetry called to notify staff that patient had a 28 beat run of SVT. Patient was seen sleeping and reported feeling fine.  MD notified. EKG was done at bedside and vitals as followed 102/54 HR 89. MD ordered to continue to monitor patient closely.

## 2020-10-22 NOTE — ED Notes (Signed)
ED TO INPATIENT HANDOFF REPORT  ED Nurse Name and Phone #: [phone]  S Name/Age/Gender Sonya Oliver 80 y.o. female Room/Bed: APA02/APA02  Code Status   Code Status: Prior  Home/SNF/Other Home Patient oriented to: self Is this baseline? Yes   Triage Complete: Triage complete  Chief Complaint AMS (altered mental status) [R41.82]  Triage Note Pt presents to ED via [facility] for increased weakness and bilateral leg edema, had fall on Monday but refused transport, decreased urinary output. Pt refused 12 lead, BP with EMS    Allergies No Known Allergies  Level of Care/Admitting Diagnosis ED Disposition    ED Disposition Condition [place]: [facility] [[id number]]  Level of Care: Telemetry [5]  Covid Evaluation: Recent COVID positive no isolation required infection day 21-90  Diagnosis: AMS (altered mental status) [[id number]]  Admitting Physician: Bethena Roys [[id number]]  Attending Physician: Bethena Roys Nessa.Cuff       B Medical/Surgery History Past Medical History:  Diagnosis Date  . Arthritis   . Bladder cancer ([place]) 2015  . Cataract   . CHF (congestive heart failure) ([place])    NO CARDIOLOGIST---  MONITORED BY PCP DR Jacelyn Grip  . COPD with emphysema ([place])    last Acute Exacerbation 08-21-2014  . H/O pulmonary edema    jan 2014--  acute CHF w/ pulmonary edema  . Hypertension   . Hyperthyroidism    currently no meds -- labs stable  . Nocturia   . Nocturnal oxygen desaturation    uses O2  HS via [place]--  and PRN daytime  . Osteoporosis   . Stroke ([place])   . Type 2 diabetes mellitus ([place])   . Ureteral tumor    Past Surgical History:  Procedure Laterality Date  . ABDOMINAL HYSTERECTOMY  1980'S   W/ BILATERAL SALPINGO-OOPHORECTOMY  . CATARACT EXTRACTION W/PHACO Left 01/31/2014   Procedure: CATARACT EXTRACTION PHACO AND INTRAOCULAR LENS PLACEMENT (IOC);  Surgeon: Tonny Branch, MD;  Location: [facility];  Service: Ophthalmology;   Laterality: Left;  CDE: 23.78  . CATARACT EXTRACTION W/PHACO Right 02/28/2014   Procedure: CATARACT EXTRACTION PHACO AND INTRAOCULAR LENS PLACEMENT (IOC);  Surgeon: Tonny Branch, MD;  Location: [facility];  Service: Ophthalmology;  Laterality: Right;  CDE:  11.34  . CYSTOSCOPY  03/23/2012   Procedure: CYSTOSCOPY;  Surgeon: Claybon Jabs, MD;  Location: [facility];  Service: Urology;  Laterality: N/A;  . CYSTOSCOPY WITH RETROGRADE PYELOGRAM, URETEROSCOPY AND STENT PLACEMENT Right 10/21/2014   Procedure: CYSTOSCOPY WITH RETROGRADE PYELOGRAM, URETEROSCOPY, BLADDER TUMOR BIOPSY;  Surgeon: Claybon Jabs, MD;  Location: [place];  Service: Urology;  Laterality: Right;  . TRANSTHORACIC ECHOCARDIOGRAM  06-21-2013   moderate LVH,  grade I diastolic dysfunction, ef 35-00%,  mild MR & TR  . TRANSURETHRAL RESECTION OF BLADDER TUMOR  03/23/2012   Procedure: TRANSURETHRAL RESECTION OF BLADDER TUMOR (TURBT);  Surgeon: Claybon Jabs, MD;  Location: [facility];  Service: Urology;  Laterality: N/A;  . TRANSURETHRAL RESECTION OF BLADDER TUMOR N/A 01/08/2013   Procedure: TRANSURETHRAL RESECTION OF BLADDER TUMOR (TURBT);  Surgeon: Claybon Jabs, MD;  Location: [facility];  Service: Urology;  Laterality: N/A;     A IV Location/Drains/Wounds Patient Lines/Drains/Airways Status    Active Line/Drains/Airways    Name Placement date Placement time Site Days   Peripheral IV 10/22/20 Right Antecubital 10/22/20  1031  Antecubital  less than 1  Intake/Output Last 24 hours  Intake/Output Summary (Last 24 hours) at 10/22/2020 1652 Last data filed at 10/22/2020 1622 Gross per 24 hour  Intake --  Output 1200 ml  Net -1200 ml    Labs/Imaging Results for orders placed or performed during the hospital encounter of 10/22/20 (from the past 48 hour(s))  Comprehensive metabolic panel     Status: Abnormal   Collection Time: 10/22/20 10:32 AM  Result Value Ref Range   Sodium 144 135 - 145 mmol/L    Potassium 3.3 (L) 3.5 - 5.1 mmol/L   Chloride 102 98 - 111 mmol/L   CO2 28 22 - 32 mmol/L   Glucose, Bld 81 70 - 99 mg/dL    Comment: Glucose reference range applies only to samples taken after fasting for at least 8 hours.   BUN 15 8 - 23 mg/dL   Creatinine, Ser 0.76 0.44 - 1.00 mg/dL   Calcium 9.0 8.9 - 10.3 mg/dL   Total Protein 7.4 6.5 - 8.1 g/dL   Albumin 2.9 (L) 3.5 - 5.0 g/dL   AST 24 15 - 41 U/L   ALT 14 0 - 44 U/L   Alkaline Phosphatase 48 38 - 126 U/L   Total Bilirubin 1.2 0.3 - 1.2 mg/dL   GFR, Estimated >60 >60 mL/min    Comment: (NOTE) Calculated using the CKD-EPI Creatinine Equation (2021)    Anion gap 14 5 - 15    Comment: Performed at [facility], [address], [place], [place] [address]  CBC with Differential/Platelet     Status: Abnormal   Collection Time: 10/22/20 10:32 AM  Result Value Ref Range   WBC 5.5 4.0 - 10.5 K/uL   RBC 4.26 3.87 - 5.11 MIL/uL   Hemoglobin 11.1 (L) 12.0 - 15.0 g/dL   HCT 37.4 36.0 - 46.0 %   MCV 87.8 80.0 - 100.0 fL   MCH 26.1 26.0 - 34.0 pg   MCHC 29.7 (L) 30.0 - 36.0 g/dL   RDW 16.2 (H) 11.5 - 15.5 %   Platelets 157 150 - 400 K/uL   nRBC 0.0 0.0 - 0.2 %   Neutrophils Relative % 78 %   Neutro Abs 4.2 1.7 - 7.7 K/uL   Lymphocytes Relative 13 %   Lymphs Abs 0.7 0.7 - 4.0 K/uL   Monocytes Relative 9 %   Monocytes Absolute 0.5 0.1 - 1.0 K/uL   Eosinophils Relative 0 %   Eosinophils Absolute 0.0 0.0 - 0.5 K/uL   Basophils Relative 0 %   Basophils Absolute 0.0 0.0 - 0.1 K/uL   Immature Granulocytes 0 %   Abs Immature Granulocytes 0.02 0.00 - 0.07 K/uL    Comment: Performed at [facility], [address], [place], [place] [address]  Brain natriuretic peptide     Status: Abnormal   Collection Time: 10/22/20 10:33 AM  Result Value Ref Range   B Natriuretic Peptide 207.0 (H) 0.0 - 100.0 pg/mL    Comment: Performed at [facility], [address], [place],  [address]  Urinalysis, Routine w reflex microscopic Urine,  Catheterized     Status: Abnormal   Collection Time: 10/22/20 10:35 AM  Result Value Ref Range   Color, Urine YELLOW YELLOW   APPearance HAZY (A) CLEAR   Specific Gravity, Urine 1.011 1.005 - 1.030   pH 5.0 5.0 - 8.0   Glucose, UA NEGATIVE NEGATIVE mg/dL   Hgb urine dipstick NEGATIVE NEGATIVE   Bilirubin Urine NEGATIVE NEGATIVE   Ketones, ur 5 (A)  NEGATIVE mg/dL   Protein, ur NEGATIVE NEGATIVE mg/dL   Nitrite NEGATIVE NEGATIVE   Leukocytes,Ua MODERATE (A) NEGATIVE   RBC / HPF 0-5 0 - 5 RBC/hpf   WBC, UA 11-20 0 - 5 WBC/hpf   Bacteria, UA RARE (A) NONE SEEN   Squamous Epithelial / LPF 0-5 0 - 5   WBC Clumps PRESENT    Mucus PRESENT     Comment: Performed at [facility], [address] [place], [place] [address]   CT Head Wo Contrast  Result Date: 10/22/2020 CLINICAL DATA:  Head trauma, minor. Poly trauma, critical, head/cervical spine injury suspected. EXAM: CT HEAD WITHOUT CONTRAST CT CERVICAL SPINE WITHOUT CONTRAST TECHNIQUE: Multidetector CT imaging of the head and cervical spine was performed following the standard protocol without intravenous contrast. Multiplanar CT image reconstructions of the cervical spine were also generated. COMPARISON:  Head CT 08/15/2020. FINDINGS: CT HEAD FINDINGS Brain: Mildly motion degraded exam. Stable cerebral and cerebellar atrophy. Moderate ill-defined hypoattenuation within the cerebral white matter is nonspecific, but compatible with chronic small vessel ischemic disease. There is no acute intracranial hemorrhage. No demarcated cortical infarct. No extra-axial fluid collection. No evidence of intracranial mass. No midline shift. Vascular: No hyperdense vessel.  Atherosclerotic calcifications. Skull: Normal. Negative for fracture or focal lesion. Sinuses/Orbits: Visualized orbits show no acute finding. No significant paranasal sinus disease at the imaged levels. CT CERVICAL SPINE FINDINGS Alignment: Straightening of the expected cervical lordosis. No  significant spondylolisthesis. Skull base and vertebrae: The basion-dental and atlanto-dental intervals are maintained.No evidence of acute fracture to the cervical spine. Soft tissues and spinal canal: No prevertebral fluid or swelling. No visible canal hematoma. Disc levels: Cervical spondylosis with multilevel disc space narrowing, disc bulges, posterior disc osteophytes, uncovertebral hypertrophy and facet arthrosis. Disc space narrowing is severe at C3-C4, C4-C5 and moderate/severe at C5-C6. Disc bulges and posterior disc osteophytes contribute to suspected severe spinal canal stenosis at C3-C4, C4-C5 and C5-C6. Multilevel bony neural foraminal narrowing. Upper chest: Emphysema. No consolidation within the imaged lung apices. No visible pneumothorax. Other: Motion artifact limits evaluation of the thyroid gland. Thyromegaly with right thyroid lobe calcifications. IMPRESSION: CT head: 1. Mildly motion degraded exam. 2. No evidence of acute intracranial abnormality. 3. Stable generalized atrophy of the brain and chronic small vessel ischemic disease. CT cervical spine: 1. Mildly motion degraded exam. 2. No evidence of acute fracture to the cervical spine. 3. Nonspecific straightening of the expected cervical lordosis. 4. Advanced cervical spondylosis as described. Suspected severe spinal canal stenosis at C3-C4, C4-C5 and C5-C6. 5. Thyromegaly with right thyroid lobe calcifications. Please refer to prior thyroid ultrasound 03/13/2020 for further description of the gland. 6.  Emphysema (ICD10-J43.9). Electronically Signed   By: Kellie Simmering DO   On: 10/22/2020 12:25   CT Chest W Contrast  Result Date: 10/22/2020 CLINICAL DATA:  Mass seen on previous CT EXAM: CT CHEST WITH CONTRAST TECHNIQUE: Multidetector CT imaging of the chest was performed during intravenous contrast administration. CONTRAST:  39mL OMNIPAQUE IOHEXOL 300 MG/ML  SOLN COMPARISON:  Chest CT examinations December 14, 2015 and August 26, 2020;  chest radiograph October 22, 2020 FINDINGS: Cardiovascular: The ascending thoracic aortic diameter measures 4.0 x 4.0 cm. No evident thoracic aortic dissection. There is extensive aortic atherosclerosis throughout multiple visualized great vessels. There is aortic atherosclerosis. There are multiple foci of coronary artery calcification. There is cardiomegaly. No pericardial effusion or pericardial thickening. No major vessel pulmonary embolus evident. Note that the timing bolus was not triggered to  optimize pulmonary arterial vascular visualization. There is prominence of the main pulmonary outflow tract measuring 3.6 cm in diameter. Mediastinum/Nodes: The thyroid is again noted to be diffusely enlarged. There is extensive calcification in the enlarged right lobe with a mass occupying much of the right lobe, unchanged from the 2017 study. Thyroid appears stable for essentially a 5 year time span which is indicative of benign etiology and does not warrant additional imaging surveillance unless clinical symptoms so warrant. No evident thoracic adenopathy.  No esophageal lesions are evident. Lungs/Pleura: There is an irregular mass in the superior segment of the left lower lobe, similar in appearance compared to prior CT examination approximately 2 months prior. This mass measures 3.3 x 2.7 cm. There are areas of decreased attenuation centrally, likely representing a degree of central necrosis within this apparent neoplasm. There is atelectatic change in the lung bases, stable. There is underlying centrilobular and paraseptal emphysematous change. No pleural effusions. No pneumothorax. Trachea and major bronchial structures appear normal. Upper Abdomen: There is upper abdominal aortic atherosclerosis. There is hepatic steatosis. Left adrenal hypertrophy is incompletely visualized, stable in appearance. Musculoskeletal: There are foci of degenerative change in the thoracic spine. No blastic or lytic bone lesions. No  chest wall lesions. IMPRESSION: 1. Slightly irregular mass in the superior segment of the left lower lobe with apparent focus of central necrosis. Appearance consistent with neoplastic lesion. Consultation with pulmonary medicine or thoracic surgery advised. Tissue sampling may well be warranted given the appearance of this lesion. 2. Atelectatic change in the lung bases, more severe on the right than on the left. Underlying emphysematous changes. 3.  No appreciable adenopathy. 4. Ascending thoracic aorta measures 4.0 x 4.0 cm in diameter. Recommend annual imaging followup by CTA or MRA. This recommendation follows 2010 ACCF/AHA/AATS/ACR/ASA/SCA/SCAI/SIR/STS/SVM Guidelines for the Diagnosis and Management of Patients with Thoracic Aortic Disease. Circulation. 2010; 121: A416-S063. Aortic aneurysm NOS (ICD10-I71.9). No dissection evident. Aortic atherosclerosis noted. There are foci of calcification great vessels and coronary arteries. 5. Stable enlarged thyroid with masses, primarily in the right lobe. Stability has been documented for essentially 5 year time span. Additional imaging surveillance not warranted per consensus guidelines. 6.  Hepatic steatosis. Aortic Atherosclerosis (ICD10-I70.0) and Emphysema (ICD10-J43.9). Electronically Signed   By: Lowella Grip III M.D.   On: 10/22/2020 14:31   CT Cervical Spine Wo Contrast  Result Date: 10/22/2020 CLINICAL DATA:  Head trauma, minor. Poly trauma, critical, head/cervical spine injury suspected. EXAM: CT HEAD WITHOUT CONTRAST CT CERVICAL SPINE WITHOUT CONTRAST TECHNIQUE: Multidetector CT imaging of the head and cervical spine was performed following the standard protocol without intravenous contrast. Multiplanar CT image reconstructions of the cervical spine were also generated. COMPARISON:  Head CT 08/15/2020. FINDINGS: CT HEAD FINDINGS Brain: Mildly motion degraded exam. Stable cerebral and cerebellar atrophy. Moderate ill-defined hypoattenuation within  the cerebral white matter is nonspecific, but compatible with chronic small vessel ischemic disease. There is no acute intracranial hemorrhage. No demarcated cortical infarct. No extra-axial fluid collection. No evidence of intracranial mass. No midline shift. Vascular: No hyperdense vessel.  Atherosclerotic calcifications. Skull: Normal. Negative for fracture or focal lesion. Sinuses/Orbits: Visualized orbits show no acute finding. No significant paranasal sinus disease at the imaged levels. CT CERVICAL SPINE FINDINGS Alignment: Straightening of the expected cervical lordosis. No significant spondylolisthesis. Skull base and vertebrae: The basion-dental and atlanto-dental intervals are maintained.No evidence of acute fracture to the cervical spine. Soft tissues and spinal canal: No prevertebral fluid or swelling. No visible canal hematoma. Disc  levels: Cervical spondylosis with multilevel disc space narrowing, disc bulges, posterior disc osteophytes, uncovertebral hypertrophy and facet arthrosis. Disc space narrowing is severe at C3-C4, C4-C5 and moderate/severe at C5-C6. Disc bulges and posterior disc osteophytes contribute to suspected severe spinal canal stenosis at C3-C4, C4-C5 and C5-C6. Multilevel bony neural foraminal narrowing. Upper chest: Emphysema. No consolidation within the imaged lung apices. No visible pneumothorax. Other: Motion artifact limits evaluation of the thyroid gland. Thyromegaly with right thyroid lobe calcifications. IMPRESSION: CT head: 1. Mildly motion degraded exam. 2. No evidence of acute intracranial abnormality. 3. Stable generalized atrophy of the brain and chronic small vessel ischemic disease. CT cervical spine: 1. Mildly motion degraded exam. 2. No evidence of acute fracture to the cervical spine. 3. Nonspecific straightening of the expected cervical lordosis. 4. Advanced cervical spondylosis as described. Suspected severe spinal canal stenosis at C3-C4, C4-C5 and C5-C6. 5.  Thyromegaly with right thyroid lobe calcifications. Please refer to prior thyroid ultrasound 03/13/2020 for further description of the gland. 6.  Emphysema (ICD10-J43.9). Electronically Signed   By: Kellie Simmering DO   On: 10/22/2020 12:25   CT Hip Left Wo Contrast  Result Date: 10/22/2020 CLINICAL DATA:  Left hip pain since a fall 10/16/2020. Initial encounter. EXAM: CT OF THE LEFT HIP WITHOUT CONTRAST TECHNIQUE: Multidetector CT imaging of the left hip was performed according to the standard protocol. Multiplanar CT image reconstructions were also generated. COMPARISON:  Plain films left hip today. FINDINGS: Bones/Joint/Cartilage There is no fracture, dislocation or other acute bony or joint abnormality. Mild degenerative change at the symphysis pubis, left hip and left SI joint noted. No avascular necrosis of the left femoral head. Joint space is preserved. Mild chondrocalcinosis noted. Ligaments Suboptimally assessed by CT. Muscles and Tendons Intact. Soft tissues No acute or focal abnormality. Infiltration of subcutaneous fat lateral to the left hip could be due to contusion. IMPRESSION: No acute bony or joint abnormality. Infiltration of subcutaneous fat lateral to the left hip may be due to soft tissue contusion. Electronically Signed   By: Inge Rise M.D.   On: 10/22/2020 13:58   DG Chest Port 1 View  Result Date: 10/22/2020 CLINICAL DATA:  Pain following fall EXAM: PORTABLE CHEST 1 VIEW COMPARISON:  Chest radiograph and chest CT August 26, 2020 FINDINGS: Underlying emphysematous changes again noted. There is mild right base atelectasis. The left lower lobe mass seen on most recent CT is not appreciable by radiography. There is cardiomegaly with pulmonary vascularity within normal limits. No evident adenopathy. There is aortic atherosclerosis. Bones are osteoporotic. IMPRESSION: 1. The mass in the left lower lobe seen on prior CT is not appreciable on current radiographic examination. Given  concern for potential neoplasm based on the appearance of this mass on prior CT, follow-up chest CT, ideally with intravenous contrast, at this time is advised to further evaluate. 2. No edema or airspace opacity is evident. There is underlying emphysematous change. There is mild right base atelectasis. 3.  Stable cardiomegaly. 4.  Aortic Atherosclerosis (ICD10-I70.0). Electronically Signed   By: Lowella Grip III M.D.   On: 10/22/2020 11:15   DG HIPS BILAT WITH PELVIS MIN 5 VIEWS  Result Date: 10/22/2020 CLINICAL DATA:  Fall, left hip pain EXAM: DG HIP (WITH OR WITHOUT PELVIS) 5+V BILAT COMPARISON:  09/04/2020, 08/15/2020 FINDINGS: Osseous structures are diffusely demineralized. Possible cortical irregularity in the subcapital region of the proximal left femur, not well characterized given lack of frog-lateral view. Cross-table lateral view is limited by overlapping structures.  Right hip joint appears intact without fracture or dislocation. Sacrum is largely obscured by overlying stool and bowel gas. No evidence of pelvic diastasis. IMPRESSION: Possible cortical irregularity in the subcapital region of the proximal left femur, not well characterized. A CT scan of the left hip is recommended for further evaluation. Electronically Signed   By: Davina Poke D.O.   On: 10/22/2020 12:03    Pending Labs Unresulted Labs (From admission, onward)          Start     Ordered   10/22/20 1118  Urine Culture  ONCE - STAT,   STAT        10/22/20 1117          Vitals/Pain Today's Vitals   10/22/20 1400 10/22/20 1430 10/22/20 1500 10/22/20 1534  BP: (!) 124/58 (!) 107/51 (!) 102/52 (!) 118/55  Pulse: 90 90 93 88  Resp: 19 16 15  (!) 22  Temp:      TempSrc:      SpO2: 98% 100% 100% 97%  Weight:      Height:        Isolation Precautions No active isolations  Medications Medications  furosemide (LASIX) injection 80 mg (80 mg Intravenous Given 10/22/20 1218)  iohexol (OMNIPAQUE) 300 MG/ML  solution 75 mL (75 mLs Intravenous Contrast Given 10/22/20 1321)  cefTRIAXone (ROCEPHIN) 1 g in sodium chloride 0.9 % 100 mL IVPB (1 g Intravenous New Bag/Given 10/22/20 1618)    Mobility walks High fall risk   Focused Assessments    R Recommendations: See Admitting Provider Note  Report given to:   Additional Notes:

## 2020-10-22 NOTE — Plan of Care (Signed)

## 2020-10-22 NOTE — ED Notes (Signed)
Pt linens changed, gown changed, pericare preformed.

## 2020-10-22 NOTE — ED Provider Notes (Signed)
[facility] Provider Note   CSN: [id number] Arrival date & time: 10/22/20  1003     History Chief Complaint  Patient presents with  . Weakness    Sonya Oliver is a 80 y.o. female.  Patient brought in by [facility].  For increased weakness bilateral leg swelling.  Fairly had a fall on Monday EMS was called out.  Patient also has decreased urine output.  Patient refused to be evaluated on Monday following the fall.  According to patient's son patient is normally ambulatory with a walker and does feed herself.  He says that she definitely had a mental status change starting yesterday.  That is the main reason why he had them bring her in patient was admitted December 28 for COVID-19 infection.  Then was reevaluated more recently on January 5 for knee syncope.  But was discharged home.  Patient does have home oxygen available but does not use it.  Patient upon arrival here is not very verbal.  Not completely responsive.  Oxygen saturations on room air were a little below 90%.  But on 2 L they are in the low 90s.  Patient has been vaccinated for Covid.  And as stated had Covid at the end of December.        Past Medical History:  Diagnosis Date  . Arthritis   . Bladder cancer ([place]) 2015  . Cataract   . CHF (congestive heart failure) ([place])    NO CARDIOLOGIST---  MONITORED BY PCP DR Jacelyn Grip  . COPD with emphysema ([place])    last Acute Exacerbation 08-21-2014  . H/O pulmonary edema    jan 2014--  acute CHF w/ pulmonary edema  . Hypertension   . Hyperthyroidism    currently no meds -- labs stable  . Nocturia   . Nocturnal oxygen desaturation    uses O2  HS via [place]--  and PRN daytime  . Osteoporosis   . Stroke ([place])   . Type 2 diabetes mellitus ([place])   . Ureteral tumor     Patient Active Problem List   Diagnosis Date Noted  . History of COVID-19 09/22/2020  . Recurrent falls 09/22/2020  . Generalized weakness 09/22/2020  . Mass of lower lobe of left  lung 08/27/2020  . Acute respiratory failure with hypoxia ([place]) 08/27/2020  . 2019 novel coronavirus disease (COVID-19) 08/26/2020  . 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 Chronic diastolic CHF (congestive heart failure) ([place]) 09/29/2016  . History of smoking 25-50 pack years 07/20/2016  . Aortic atherosclerosis ([place]) 07/20/2016  . Osteoporosis 07/08/2016  . Diabetes mellitus, type 2 ([place]) 12/14/2015  . COPD (chronic obstructive pulmonary disease) ([place]) 12/19/2012  . Vitamin D deficiency 12/19/2012  . Essential hypertension 12/19/2012  . Edema leg 09/12/2012  . History of anemia 03/21/2012  . Subclinical hyperthyroidism 02/23/2012    Past Surgical History:  Procedure Laterality Date  . ABDOMINAL HYSTERECTOMY  1980'S   W/ BILATERAL SALPINGO-OOPHORECTOMY  . CATARACT EXTRACTION W/PHACO Left 01/31/2014   Procedure: CATARACT EXTRACTION PHACO AND INTRAOCULAR LENS PLACEMENT (IOC);  Surgeon: Tonny Branch, MD;  Location: [facility];  Service: Ophthalmology;  Laterality: Left;  CDE: 23.78  . CATARACT EXTRACTION W/PHACO Right 02/28/2014   Procedure: CATARACT EXTRACTION PHACO AND INTRAOCULAR LENS PLACEMENT (IOC);  Surgeon: Tonny Branch, MD;  Location: [facility];  Service: Ophthalmology;  Laterality: Right;  CDE:  11.34  . CYSTOSCOPY  03/23/2012   Procedure: CYSTOSCOPY;  Surgeon: Claybon Jabs, MD;  Location: [facility];  Service: Urology;  Laterality: N/A;  . CYSTOSCOPY WITH RETROGRADE PYELOGRAM, URETEROSCOPY AND STENT PLACEMENT Right 10/21/2014   Procedure: CYSTOSCOPY WITH RETROGRADE PYELOGRAM, URETEROSCOPY, BLADDER TUMOR BIOPSY;  Surgeon: Claybon Jabs, MD;  Location: [facility];  Service: Urology;  Laterality: Right;  . TRANSTHORACIC ECHOCARDIOGRAM  06-21-2013   moderate LVH,  grade I diastolic dysfunction, ef 14-78%,  mild MR & TR  . TRANSURETHRAL RESECTION OF BLADDER TUMOR  03/23/2012   Procedure: TRANSURETHRAL RESECTION OF BLADDER TUMOR (TURBT);  Surgeon: Claybon Jabs, MD;  Location: [facility];  Service:  Urology;  Laterality: N/A;  . TRANSURETHRAL RESECTION OF BLADDER TUMOR N/A 01/08/2013   Procedure: TRANSURETHRAL RESECTION OF BLADDER TUMOR (TURBT);  Surgeon: Claybon Jabs, MD;  Location: [facility];  Service: Urology;  Laterality: N/A;     OB History   No obstetric history on file.     Family History  Problem Relation Age of Onset  . Diabetes Father   . Congestive Heart Failure Father   . COPD Father   . Kidney disease Father   . Hypertension Father   . Diabetes Mother        with kidney disease  . Kidney disease Mother   . Hypertension Mother   . Hyperlipidemia Mother   . Heart attack Mother   . Stroke Mother   . Heart attack Brother   . Asthma Brother   . Diabetes Daughter   . Diabetes Son   . Cancer Brother        throat  . Hypertension Brother     Social History   Tobacco Use  . Smoking status: Former Smoker    Packs/day: 0.25    Years: 50.00    Pack years: 12.50    Types: Cigarettes    Quit date: 09/24/2006    Years since quitting: 14.0  . Smokeless tobacco: Never Used  Vaping Use  . Vaping Use: Never used  Substance Use Topics  . Alcohol use: No  . Drug use: No    Home Medications Prior to Admission medications   Medication Sig Start Date End Date Taking? Authorizing Provider  albuterol (PROVENTIL) (2.5 MG/3ML) 0.083% nebulizer solution Inhale 3 mLs into the lungs 4 (four) times daily as needed for shortness of breath. 02/19/19   Terald Sleeper, PA-C  albuterol (VENTOLIN HFA) 108 (90 Base) MCG/ACT inhaler INHALE 1-2 PUFFS BY MOUTH EVERY 6 HOURS AS NEEDED FOR WHEEZE OR SHORTNESS OF BREATH 10/06/20   Loman [place], FNP  alendronate (FOSAMAX) 70 MG tablet TAKE 1 TABLET ONCE A WEEK. TAKE WITH A FULL GLASS OF WATER ON AN EMPTY STOMACH. 10/07/20   Loman [place], FNP  amLODipine (NORVASC) 2.5 MG tablet Take 1 tablet (2.5 mg total) by mouth daily. 09/18/20   Loman [place], FNP  atorvastatin (LIPITOR) 10 MG tablet TAKE 1 TABLET BY MOUTH EVERY  DAY Patient taking differently: Take 10 mg by mouth daily. 08/25/20   Claretta Fraise, MD  carvedilol (COREG) 3.125 MG tablet Take 1 tablet (3.125 mg total) by mouth 2 (two) times daily. 08/25/20   Claretta Fraise, MD  Cholecalciferol (VITAMIN D3) 2000 UNITS TABS Take 1 tablet by mouth daily.    [provider]  cycloSPORINE (RESTASIS MULTIDOSE) 0.05 % ophthalmic emulsion Place 1 drop into both eyes 2 (two) times daily. 04/23/20   Loman [place], FNP  Fluticasone-Salmeterol (ADVAIR DISKUS) 100-50 MCG/DOSE AEPB Inhale 1 puff into the lungs 2 (two) times daily. 05/16/20  Loman [place], FNP  furosemide (LASIX) 40 MG tablet Take 1 tablet (40 mg total) by mouth daily. 09/18/20   Loman [place], FNP  guaiFENesin-dextromethorphan (ROBITUSSIN DM) 100-10 MG/5ML syrup Take 10 mLs by mouth every 4 (four) hours as needed for cough. 09/01/20   Manuella Ghazi, Pratik D, DO  lisinopril (ZESTRIL) 40 MG tablet Take 1 tablet (40 mg total) by mouth daily. 08/07/20   Gwenlyn Perking, FNP  metFORMIN (GLUCOPHAGE) 500 MG tablet Take 1 tablet (500 mg total) by mouth daily. 07/10/20   Gwenlyn Perking, FNP  methimazole (TAPAZOLE) 10 MG tablet Take 0.5 tablets (5 mg total) by mouth daily. 09/22/20   Brita Romp, NP  OXYGEN Inhale 2 L into the lungs as needed.    [provider]  pantoprazole (PROTONIX) 40 MG tablet Take 1 tablet (40 mg total) by mouth daily for 10 days. 09/18/20 09/28/20  Loman [place], FNP  potassium chloride SA (KLOR-CON) 20 MEQ tablet Take 1 tablet (20 mEq total) by mouth daily. 08/25/20   Claretta Fraise, MD    Allergies    Patient has no known allergies.  Review of Systems   Review of Systems  Unable to perform ROS: Mental status change    Physical Exam Updated Vital Signs BP (!) 102/52   Pulse 93   Temp 98.2 F (36.8 C) (Oral)   Resp 15   Ht 1.676 m (5\' 6" )   Wt 74.9 kg   SpO2 100%   BMI 26.65 kg/m   Physical Exam Vitals and nursing note reviewed.  Constitutional:       General: She is not in acute distress.    Appearance: Normal appearance. She is well-developed and well-nourished.  HENT:     Head: Normocephalic and atraumatic.  Eyes:     Extraocular Movements: Extraocular movements intact.     Conjunctiva/sclera: Conjunctivae normal.     Pupils: Pupils are equal, round, and reactive to light.  Cardiovascular:     Rate and Rhythm: Normal rate and regular rhythm.     Heart sounds: No murmur heard.   Pulmonary:     Effort: Pulmonary effort is normal. No respiratory distress.     Breath sounds: Normal breath sounds.  Abdominal:     Palpations: Abdomen is soft.     Tenderness: There is no abdominal tenderness.  Musculoskeletal:        General: No edema.     Cervical back: Neck supple.  Skin:    General: Skin is warm and dry.  Neurological:     Mental Status: She is alert.  Psychiatric:        Mood and Affect: Mood and affect normal.     ED Results / Procedures / Treatments   Labs (all labs ordered are listed, but only abnormal results are displayed) Labs Reviewed  COMPREHENSIVE METABOLIC PANEL - Abnormal; Notable for the following components:      Result Value   Potassium 3.3 (*)    Albumin 2.9 (*)    All other components within normal limits  CBC WITH DIFFERENTIAL/PLATELET - Abnormal; Notable for the following components:   Hemoglobin 11.1 (*)    MCHC 29.7 (*)    RDW 16.2 (*)    All other components within normal limits  BRAIN NATRIURETIC PEPTIDE - Abnormal; Notable for the following components:   B Natriuretic Peptide 207.0 (*)    All other components within normal limits  URINALYSIS, ROUTINE W REFLEX MICROSCOPIC - Abnormal; Notable for the  following components:   APPearance HAZY (*)    Ketones, ur 5 (*)    Leukocytes,Ua MODERATE (*)    Bacteria, UA RARE (*)    All other components within normal limits  URINE CULTURE    EKG EKG Interpretation  Date/Time:  Wednesday October 22 2020 10:30:40 EST Ventricular Rate:   111 PR Interval:    QRS Duration: 96 QT Interval:  400 QTC Calculation: 544 R Axis:   86 Text Interpretation: Sinus tachycardia with irregular rate Borderline right axis deviation Borderline T abnormalities, inferior leads Prolonged QT interval Confirmed by Fredia Sorrow [phone]) on 10/22/2020 10:50:15 AM   Radiology CT Head Wo Contrast  Result Date: 10/22/2020 CLINICAL DATA:  Head trauma, minor. Poly trauma, critical, head/cervical spine injury suspected. EXAM: CT HEAD WITHOUT CONTRAST CT CERVICAL SPINE WITHOUT CONTRAST TECHNIQUE: Multidetector CT imaging of the head and cervical spine was performed following the standard protocol without intravenous contrast. Multiplanar CT image reconstructions of the cervical spine were also generated. COMPARISON:  Head CT 08/15/2020. FINDINGS: CT HEAD FINDINGS Brain: Mildly motion degraded exam. Stable cerebral and cerebellar atrophy. Moderate ill-defined hypoattenuation within the cerebral white matter is nonspecific, but compatible with chronic small vessel ischemic disease. There is no acute intracranial hemorrhage. No demarcated cortical infarct. No extra-axial fluid collection. No evidence of intracranial mass. No midline shift. Vascular: No hyperdense vessel.  Atherosclerotic calcifications. Skull: Normal. Negative for fracture or focal lesion. Sinuses/Orbits: Visualized orbits show no acute finding. No significant paranasal sinus disease at the imaged levels. CT CERVICAL SPINE FINDINGS Alignment: Straightening of the expected cervical lordosis. No significant spondylolisthesis. Skull base and vertebrae: The basion-dental and atlanto-dental intervals are maintained.No evidence of acute fracture to the cervical spine. Soft tissues and spinal canal: No prevertebral fluid or swelling. No visible canal hematoma. Disc levels: Cervical spondylosis with multilevel disc space narrowing, disc bulges, posterior disc osteophytes, uncovertebral hypertrophy and facet  arthrosis. Disc space narrowing is severe at C3-C4, C4-C5 and moderate/severe at C5-C6. Disc bulges and posterior disc osteophytes contribute to suspected severe spinal canal stenosis at C3-C4, C4-C5 and C5-C6. Multilevel bony neural foraminal narrowing. Upper chest: Emphysema. No consolidation within the imaged lung apices. No visible pneumothorax. Other: Motion artifact limits evaluation of the thyroid gland. Thyromegaly with right thyroid lobe calcifications. IMPRESSION: CT head: 1. Mildly motion degraded exam. 2. No evidence of acute intracranial abnormality. 3. Stable generalized atrophy of the brain and chronic small vessel ischemic disease. CT cervical spine: 1. Mildly motion degraded exam. 2. No evidence of acute fracture to the cervical spine. 3. Nonspecific straightening of the expected cervical lordosis. 4. Advanced cervical spondylosis as described. Suspected severe spinal canal stenosis at C3-C4, C4-C5 and C5-C6. 5. Thyromegaly with right thyroid lobe calcifications. Please refer to prior thyroid ultrasound 03/13/2020 for further description of the gland. 6.  Emphysema (ICD10-J43.9). Electronically Signed   By: Kellie Simmering DO   On: 10/22/2020 12:25   CT Chest W Contrast  Result Date: 10/22/2020 CLINICAL DATA:  Mass seen on previous CT EXAM: CT CHEST WITH CONTRAST TECHNIQUE: Multidetector CT imaging of the chest was performed during intravenous contrast administration. CONTRAST:  42mL OMNIPAQUE IOHEXOL 300 MG/ML  SOLN COMPARISON:  Chest CT examinations December 14, 2015 and August 26, 2020; chest radiograph October 22, 2020 FINDINGS: Cardiovascular: The ascending thoracic aortic diameter measures 4.0 x 4.0 cm. No evident thoracic aortic dissection. There is extensive aortic atherosclerosis throughout multiple visualized great vessels. There is aortic atherosclerosis. There are multiple foci of coronary artery calcification.  There is cardiomegaly. No pericardial effusion or pericardial thickening. No  major vessel pulmonary embolus evident. Note that the timing bolus was not triggered to optimize pulmonary arterial vascular visualization. There is prominence of the main pulmonary outflow tract measuring 3.6 cm in diameter. Mediastinum/Nodes: The thyroid is again noted to be diffusely enlarged. There is extensive calcification in the enlarged right lobe with a mass occupying much of the right lobe, unchanged from the 2017 study. Thyroid appears stable for essentially a 5 year time span which is indicative of benign etiology and does not warrant additional imaging surveillance unless clinical symptoms so warrant. No evident thoracic adenopathy.  No esophageal lesions are evident. Lungs/Pleura: There is an irregular mass in the superior segment of the left lower lobe, similar in appearance compared to prior CT examination approximately 2 months prior. This mass measures 3.3 x 2.7 cm. There are areas of decreased attenuation centrally, likely representing a degree of central necrosis within this apparent neoplasm. There is atelectatic change in the lung bases, stable. There is underlying centrilobular and paraseptal emphysematous change. No pleural effusions. No pneumothorax. Trachea and major bronchial structures appear normal. Upper Abdomen: There is upper abdominal aortic atherosclerosis. There is hepatic steatosis. Left adrenal hypertrophy is incompletely visualized, stable in appearance. Musculoskeletal: There are foci of degenerative change in the thoracic spine. No blastic or lytic bone lesions. No chest wall lesions. IMPRESSION: 1. Slightly irregular mass in the superior segment of the left lower lobe with apparent focus of central necrosis. Appearance consistent with neoplastic lesion. Consultation with pulmonary medicine or thoracic surgery advised. Tissue sampling may well be warranted given the appearance of this lesion. 2. Atelectatic change in the lung bases, more severe on the right than on the left.  Underlying emphysematous changes. 3.  No appreciable adenopathy. 4. Ascending thoracic aorta measures 4.0 x 4.0 cm in diameter. Recommend annual imaging followup by CTA or MRA. This recommendation follows 2010 ACCF/AHA/AATS/ACR/ASA/SCA/SCAI/SIR/STS/SVM Guidelines for the Diagnosis and Management of Patients with Thoracic Aortic Disease. Circulation. 2010; 121: W258-N277. Aortic aneurysm NOS (ICD10-I71.9). No dissection evident. Aortic atherosclerosis noted. There are foci of calcification great vessels and coronary arteries. 5. Stable enlarged thyroid with masses, primarily in the right lobe. Stability has been documented for essentially 5 year time span. Additional imaging surveillance not warranted per consensus guidelines. 6.  Hepatic steatosis. Aortic Atherosclerosis (ICD10-I70.0) and Emphysema (ICD10-J43.9). Electronically Signed   By: Lowella Grip III M.D.   On: 10/22/2020 14:31   CT Cervical Spine Wo Contrast  Result Date: 10/22/2020 CLINICAL DATA:  Head trauma, minor. Poly trauma, critical, head/cervical spine injury suspected. EXAM: CT HEAD WITHOUT CONTRAST CT CERVICAL SPINE WITHOUT CONTRAST TECHNIQUE: Multidetector CT imaging of the head and cervical spine was performed following the standard protocol without intravenous contrast. Multiplanar CT image reconstructions of the cervical spine were also generated. COMPARISON:  Head CT 08/15/2020. FINDINGS: CT HEAD FINDINGS Brain: Mildly motion degraded exam. Stable cerebral and cerebellar atrophy. Moderate ill-defined hypoattenuation within the cerebral white matter is nonspecific, but compatible with chronic small vessel ischemic disease. There is no acute intracranial hemorrhage. No demarcated cortical infarct. No extra-axial fluid collection. No evidence of intracranial mass. No midline shift. Vascular: No hyperdense vessel.  Atherosclerotic calcifications. Skull: Normal. Negative for fracture or focal lesion. Sinuses/Orbits: Visualized orbits show  no acute finding. No significant paranasal sinus disease at the imaged levels. CT CERVICAL SPINE FINDINGS Alignment: Straightening of the expected cervical lordosis. No significant spondylolisthesis. Skull base and vertebrae: The basion-dental and atlanto-dental intervals  are maintained.No evidence of acute fracture to the cervical spine. Soft tissues and spinal canal: No prevertebral fluid or swelling. No visible canal hematoma. Disc levels: Cervical spondylosis with multilevel disc space narrowing, disc bulges, posterior disc osteophytes, uncovertebral hypertrophy and facet arthrosis. Disc space narrowing is severe at C3-C4, C4-C5 and moderate/severe at C5-C6. Disc bulges and posterior disc osteophytes contribute to suspected severe spinal canal stenosis at C3-C4, C4-C5 and C5-C6. Multilevel bony neural foraminal narrowing. Upper chest: Emphysema. No consolidation within the imaged lung apices. No visible pneumothorax. Other: Motion artifact limits evaluation of the thyroid gland. Thyromegaly with right thyroid lobe calcifications. IMPRESSION: CT head: 1. Mildly motion degraded exam. 2. No evidence of acute intracranial abnormality. 3. Stable generalized atrophy of the brain and chronic small vessel ischemic disease. CT cervical spine: 1. Mildly motion degraded exam. 2. No evidence of acute fracture to the cervical spine. 3. Nonspecific straightening of the expected cervical lordosis. 4. Advanced cervical spondylosis as described. Suspected severe spinal canal stenosis at C3-C4, C4-C5 and C5-C6. 5. Thyromegaly with right thyroid lobe calcifications. Please refer to prior thyroid ultrasound 03/13/2020 for further description of the gland. 6.  Emphysema (ICD10-J43.9). Electronically Signed   By: Kellie Simmering DO   On: 10/22/2020 12:25   CT Hip Left Wo Contrast  Result Date: 10/22/2020 CLINICAL DATA:  Left hip pain since a fall 10/16/2020. Initial encounter. EXAM: CT OF THE LEFT HIP WITHOUT CONTRAST TECHNIQUE:  Multidetector CT imaging of the left hip was performed according to the standard protocol. Multiplanar CT image reconstructions were also generated. COMPARISON:  Plain films left hip today. FINDINGS: Bones/Joint/Cartilage There is no fracture, dislocation or other acute bony or joint abnormality. Mild degenerative change at the symphysis pubis, left hip and left SI joint noted. No avascular necrosis of the left femoral head. Joint space is preserved. Mild chondrocalcinosis noted. Ligaments Suboptimally assessed by CT. Muscles and Tendons Intact. Soft tissues No acute or focal abnormality. Infiltration of subcutaneous fat lateral to the left hip could be due to contusion. IMPRESSION: No acute bony or joint abnormality. Infiltration of subcutaneous fat lateral to the left hip may be due to soft tissue contusion. Electronically Signed   By: Inge Rise M.D.   On: 10/22/2020 13:58   DG Chest Port 1 View  Result Date: 10/22/2020 CLINICAL DATA:  Pain following fall EXAM: PORTABLE CHEST 1 VIEW COMPARISON:  Chest radiograph and chest CT August 26, 2020 FINDINGS: Underlying emphysematous changes again noted. There is mild right base atelectasis. The left lower lobe mass seen on most recent CT is not appreciable by radiography. There is cardiomegaly with pulmonary vascularity within normal limits. No evident adenopathy. There is aortic atherosclerosis. Bones are osteoporotic. IMPRESSION: 1. The mass in the left lower lobe seen on prior CT is not appreciable on current radiographic examination. Given concern for potential neoplasm based on the appearance of this mass on prior CT, follow-up chest CT, ideally with intravenous contrast, at this time is advised to further evaluate. 2. No edema or airspace opacity is evident. There is underlying emphysematous change. There is mild right base atelectasis. 3.  Stable cardiomegaly. 4.  Aortic Atherosclerosis (ICD10-I70.0). Electronically Signed   By: Lowella Grip III  M.D.   On: 10/22/2020 11:15   DG HIPS BILAT WITH PELVIS MIN 5 VIEWS  Result Date: 10/22/2020 CLINICAL DATA:  Fall, left hip pain EXAM: DG HIP (WITH OR WITHOUT PELVIS) 5+V BILAT COMPARISON:  09/04/2020, 08/15/2020 FINDINGS: Osseous structures are diffusely demineralized. Possible cortical irregularity  in the subcapital region of the proximal left femur, not well characterized given lack of frog-lateral view. Cross-table lateral view is limited by overlapping structures. Right hip joint appears intact without fracture or dislocation. Sacrum is largely obscured by overlying stool and bowel gas. No evidence of pelvic diastasis. IMPRESSION: Possible cortical irregularity in the subcapital region of the proximal left femur, not well characterized. A CT scan of the left hip is recommended for further evaluation. Electronically Signed   By: Davina Poke D.O.   On: 10/22/2020 12:03    Procedures Procedures   CRITICAL CARE Performed by: Fredia Sorrow Total critical care time: 35 minutes Critical care time was exclusive of separately billable procedures and treating other patients. Critical care was necessary to treat or prevent imminent or life-threatening deterioration. Critical care was time spent personally by me on the following activities: development of treatment plan with patient and/or surrogate as well as nursing, discussions with consultants, evaluation of patient's response to treatment, examination of patient, obtaining history from patient or surrogate, ordering and performing treatments and interventions, ordering and review of laboratory studies, ordering and review of radiographic studies, pulse oximetry and re-evaluation of patient's condition.   Medications Ordered in ED Medications  furosemide (LASIX) injection 80 mg (80 mg Intravenous Given 10/22/20 1218)  iohexol (OMNIPAQUE) 300 MG/ML solution 75 mL (75 mLs Intravenous Contrast Given 10/22/20 1321)    ED Course  I have  reviewed the triage vital signs and the nursing notes.  Pertinent labs & imaging results that were available during my care of the patient were reviewed by me and considered in my medical decision making (see chart for details).    MDM Rules/Calculators/A&P                          Patient with altered mental status that persists.  Oxygen levels on 2 L of oxygen are good.  Chest x-ray raises concerns about lower lobe neoplastic process.  Urinalysis raises some concerns about urinary tract infection.  Patient CT head and neck without acute findings.  Done both for the fall and head for altered mental status.  Pelvic x-rays raise some concerns about hip fracture.  CT of the hip was negative.  Patient has marked swelling of both legs but does not seem to have significant evidence of pulmonary edema in the lungs.  However BNP is elevated.  Patient given 80 mg of Lasix.  Patient I think needs admission for the urinary tract infection the altered mental status perhaps work-up for the lung mass and further diuresis for the leg swelling.  And the elevated BNP.  Patient certainly not back to baseline.  Covid testing is not warranted because patient was positive for Covid the end of December.    Final Clinical Impression(s) / ED Diagnoses Final diagnoses:  Lung mass  Lower extremity edema  Generalized weakness  Acute cystitis without hematuria  Altered mental status, unspecified altered mental status type    Rx / DC Orders ED Discharge Orders    None       Fredia Sorrow, MD 10/22/20 1521

## 2020-10-22 NOTE — ED Triage Notes (Signed)
Pt presents to ED via [facility] for increased weakness and bilateral leg edema, had fall on Monday but refused transport, decreased urinary output. Pt refused 12 lead, BP with EMS

## 2020-10-23 DIAGNOSIS — Z8673 Personal history of transient ischemic attack (TIA), and cerebral infarction without residual deficits: Secondary | ICD-10-CM | POA: Diagnosis not present

## 2020-10-23 DIAGNOSIS — Z825 Family history of asthma and other chronic lower respiratory diseases: Secondary | ICD-10-CM | POA: Diagnosis not present

## 2020-10-23 DIAGNOSIS — Z823 Family history of stroke: Secondary | ICD-10-CM | POA: Diagnosis not present

## 2020-10-23 DIAGNOSIS — Z841 Family history of disorders of kidney and ureter: Secondary | ICD-10-CM | POA: Diagnosis not present

## 2020-10-23 DIAGNOSIS — F0280 Dementia in other diseases classified elsewhere without behavioral disturbance: Secondary | ICD-10-CM | POA: Diagnosis not present

## 2020-10-23 DIAGNOSIS — M1990 Unspecified osteoarthritis, unspecified site: Secondary | ICD-10-CM | POA: Diagnosis present

## 2020-10-23 DIAGNOSIS — J9811 Atelectasis: Secondary | ICD-10-CM | POA: Diagnosis present

## 2020-10-23 DIAGNOSIS — G9341 Metabolic encephalopathy: Secondary | ICD-10-CM | POA: Diagnosis present

## 2020-10-23 DIAGNOSIS — C3492 Malignant neoplasm of unspecified part of left bronchus or lung: Secondary | ICD-10-CM | POA: Diagnosis present

## 2020-10-23 DIAGNOSIS — R296 Repeated falls: Secondary | ICD-10-CM | POA: Diagnosis present

## 2020-10-23 DIAGNOSIS — C3432 Malignant neoplasm of lower lobe, left bronchus or lung: Secondary | ICD-10-CM | POA: Diagnosis present

## 2020-10-23 DIAGNOSIS — N3000 Acute cystitis without hematuria: Secondary | ICD-10-CM | POA: Diagnosis present

## 2020-10-23 DIAGNOSIS — Z91138 Patient's unintentional underdosing of medication regimen for other reason: Secondary | ICD-10-CM | POA: Diagnosis not present

## 2020-10-23 DIAGNOSIS — N390 Urinary tract infection, site not specified: Secondary | ICD-10-CM | POA: Diagnosis present

## 2020-10-23 DIAGNOSIS — F0391 Unspecified dementia with behavioral disturbance: Secondary | ICD-10-CM | POA: Diagnosis present

## 2020-10-23 DIAGNOSIS — Z66 Do not resuscitate: Secondary | ICD-10-CM | POA: Diagnosis present

## 2020-10-23 DIAGNOSIS — I5033 Acute on chronic diastolic (congestive) heart failure: Secondary | ICD-10-CM

## 2020-10-23 DIAGNOSIS — Z8249 Family history of ischemic heart disease and other diseases of the circulatory system: Secondary | ICD-10-CM | POA: Diagnosis not present

## 2020-10-23 DIAGNOSIS — T501X6A Underdosing of loop [high-ceiling] diuretics, initial encounter: Secondary | ICD-10-CM | POA: Diagnosis present

## 2020-10-23 DIAGNOSIS — J449 Chronic obstructive pulmonary disease, unspecified: Secondary | ICD-10-CM | POA: Diagnosis present

## 2020-10-23 DIAGNOSIS — Z87891 Personal history of nicotine dependence: Secondary | ICD-10-CM | POA: Diagnosis not present

## 2020-10-23 DIAGNOSIS — R627 Adult failure to thrive: Secondary | ICD-10-CM | POA: Diagnosis present

## 2020-10-23 DIAGNOSIS — M810 Age-related osteoporosis without current pathological fracture: Secondary | ICD-10-CM | POA: Diagnosis present

## 2020-10-23 DIAGNOSIS — U071 COVID-19: Secondary | ICD-10-CM | POA: Diagnosis present

## 2020-10-23 DIAGNOSIS — Y92009 Unspecified place in unspecified non-institutional (private) residence as the place of occurrence of the external cause: Secondary | ICD-10-CM | POA: Diagnosis not present

## 2020-10-23 DIAGNOSIS — E876 Hypokalemia: Secondary | ICD-10-CM | POA: Diagnosis present

## 2020-10-23 DIAGNOSIS — Z515 Encounter for palliative care: Secondary | ICD-10-CM | POA: Diagnosis present

## 2020-10-23 DIAGNOSIS — Z833 Family history of diabetes mellitus: Secondary | ICD-10-CM | POA: Diagnosis not present

## 2020-10-23 DIAGNOSIS — Z8551 Personal history of malignant neoplasm of bladder: Secondary | ICD-10-CM | POA: Diagnosis not present

## 2020-10-23 DIAGNOSIS — G309 Alzheimer's disease, unspecified: Secondary | ICD-10-CM | POA: Diagnosis not present

## 2020-10-23 DIAGNOSIS — I110 Hypertensive heart disease with heart failure: Secondary | ICD-10-CM | POA: Diagnosis present

## 2020-10-23 DIAGNOSIS — Z20822 Contact with and (suspected) exposure to covid-19: Secondary | ICD-10-CM | POA: Diagnosis present

## 2020-10-23 DIAGNOSIS — Z9981 Dependence on supplemental oxygen: Secondary | ICD-10-CM | POA: Diagnosis not present

## 2020-10-23 DIAGNOSIS — M7989 Other specified soft tissue disorders: Secondary | ICD-10-CM | POA: Diagnosis present

## 2020-10-23 DIAGNOSIS — R451 Restlessness and agitation: Secondary | ICD-10-CM | POA: Diagnosis present

## 2020-10-23 DIAGNOSIS — R4182 Altered mental status, unspecified: Secondary | ICD-10-CM | POA: Diagnosis not present

## 2020-10-23 DIAGNOSIS — Z83438 Family history of other disorder of lipoprotein metabolism and other lipidemia: Secondary | ICD-10-CM | POA: Diagnosis not present

## 2020-10-23 DIAGNOSIS — J9611 Chronic respiratory failure with hypoxia: Secondary | ICD-10-CM

## 2020-10-23 DIAGNOSIS — J439 Emphysema, unspecified: Secondary | ICD-10-CM | POA: Diagnosis present

## 2020-10-23 DIAGNOSIS — E11649 Type 2 diabetes mellitus with hypoglycemia without coma: Secondary | ICD-10-CM | POA: Diagnosis not present

## 2020-10-23 DIAGNOSIS — Z8701 Personal history of pneumonia (recurrent): Secondary | ICD-10-CM | POA: Diagnosis not present

## 2020-10-23 DIAGNOSIS — R918 Other nonspecific abnormal finding of lung field: Secondary | ICD-10-CM | POA: Diagnosis not present

## 2020-10-23 DIAGNOSIS — W19XXXA Unspecified fall, initial encounter: Secondary | ICD-10-CM | POA: Diagnosis present

## 2020-10-23 DIAGNOSIS — I5032 Chronic diastolic (congestive) heart failure: Secondary | ICD-10-CM | POA: Diagnosis present

## 2020-10-23 DIAGNOSIS — E0590 Thyrotoxicosis, unspecified without thyrotoxic crisis or storm: Secondary | ICD-10-CM | POA: Diagnosis present

## 2020-10-23 DIAGNOSIS — E119 Type 2 diabetes mellitus without complications: Secondary | ICD-10-CM | POA: Diagnosis present

## 2020-10-23 DIAGNOSIS — Z8616 Personal history of COVID-19: Secondary | ICD-10-CM | POA: Diagnosis not present

## 2020-10-23 DIAGNOSIS — Z7189 Other specified counseling: Secondary | ICD-10-CM | POA: Diagnosis not present

## 2020-10-23 DIAGNOSIS — I700 Atherosclerosis of aorta: Secondary | ICD-10-CM | POA: Diagnosis present

## 2020-10-23 LAB — BASIC METABOLIC PANEL
Anion gap: 14 (ref 5–15)
BUN: 13 mg/dL (ref 8–23)
CO2: 32 mmol/L (ref 22–32)
Calcium: 8.8 mg/dL — ABNORMAL LOW (ref 8.9–10.3)
Chloride: 99 mmol/L (ref 98–111)
Creatinine, Ser: 0.73 mg/dL (ref 0.44–1.00)
GFR, Estimated: >60 mL/min (ref >60)
Glucose, Bld: 79 mg/dL (ref 70–99)
Potassium: 2.7 mmol/L — CL (ref 3.5–5.1)
Sodium: 145 mmol/L (ref 135–145)

## 2020-10-23 LAB — URINE CULTURE: Culture: NO GROWTH

## 2020-10-23 LAB — MAGNESIUM: Magnesium: 1.6 mg/dL — ABNORMAL LOW (ref 1.7–2.4)

## 2020-10-23 LAB — GLUCOSE, CAPILLARY
Glucose-Capillary: 72 mg/dL (ref 70–99)
Glucose-Capillary: 120 mg/dL — ABNORMAL HIGH (ref 70–99)
Glucose-Capillary: 125 mg/dL — ABNORMAL HIGH (ref 70–99)
Glucose-Capillary: 117 mg/dL — ABNORMAL HIGH (ref 70–99)

## 2020-10-23 MED ORDER — POTASSIUM CHLORIDE 10 MEQ/100ML IV SOLN
10.0000 meq | INTRAVENOUS | Status: AC
  Administered 2020-10-23 (×2): 10 meq via INTRAVENOUS
  Filled 2020-10-23 (×2): qty 100

## 2020-10-23 MED ORDER — POTASSIUM CHLORIDE 10 MEQ/100ML IV SOLN
INTRAVENOUS | Status: AC
  Administered 2020-10-23: 10 meq via INTRAVENOUS
  Filled 2020-10-23: qty 100

## 2020-10-23 MED ORDER — LORAZEPAM 2 MG/ML IJ SOLN
0.5000 mg | Freq: Four times a day (QID) | INTRAMUSCULAR | Status: DC | PRN
  Administered 2020-10-24 – 2020-10-26 (×4): 0.5 mg via INTRAVENOUS
  Filled 2020-10-23 (×5): qty 1

## 2020-10-23 MED ORDER — SODIUM CHLORIDE 0.9 % IV SOLN: INTRAVENOUS | Status: DC | PRN

## 2020-10-23 MED ORDER — MAGNESIUM SULFATE 2 GM/50ML IV SOLN
2.0000 g | Freq: Once | INTRAVENOUS | Status: AC
  Administered 2020-10-23: 2 g via INTRAVENOUS
  Filled 2020-10-23 (×2): qty 50

## 2020-10-23 NOTE — Plan of Care (Signed)
  Problem: Acute Rehab PT Goals(only PT should resolve) Goal: Pt Will Go Supine/Side To Sit Outcome: Progressing Flowsheets (Taken 10/23/2020 1355) Pt will go Supine/Side to Sit: with min guard assist Goal: Patient Will Transfer Sit To/From Stand Outcome: Progressing Flowsheets (Taken 10/23/2020 1355) Patient will transfer sit to/from stand:  with min guard assist  with minimal assist Goal: Pt Will Transfer Bed To Chair/Chair To Bed Outcome: Progressing Flowsheets (Taken 10/23/2020 1355) Pt will Transfer Bed to Chair/Chair to Bed:  min guard assist  with min assist Goal: Pt Will Ambulate Outcome: Progressing Flowsheets (Taken 10/23/2020 1355) Pt will Ambulate:  50 feet  with min guard assist  with minimal assist  with rolling walker   1:56 PM, 10/23/20 Lonell Grandchild, MPT Physical Therapist with [facility] [phone] office [phone] mobile phone

## 2020-10-23 NOTE — Progress Notes (Signed)
Patient confused this am. She woke up and pulled her IV out @ 8:30am after potassium was started. New IV inserted in her left hand @11 :30 and potassium restarted. MD Courage notified that IV medications would be delayed throughout the day. Patient alert and oriented x 2 when wide awake.

## 2020-10-23 NOTE — Progress Notes (Signed)
Patient Demographics:    Sonya Oliver, is a 80 y.o. female, DOB - 05/27/1941, YYT:[id number]  Admit date - 10/22/2020   Admitting Physician Ejiroghene Arlyce Dice, MD  Outpatient Primary MD for the patient is Loman Brooklyn, FNP  LOS - 0   Chief Complaint  Patient presents with  . Weakness        Subjective:    Sonya Oliver today has no fevers, no emesis,  No chest pain,   -Patient is a poor historian, having difficulty keeping O2 cannula on -As per patient's daughter patient remains confused , disoriented and not back to baseline yet  Assessment  & Plan :    Principal Problem:   Fall at home, initial encounter Active Problems:   Edema leg   COPD (chronic obstructive pulmonary disease) ([place])   Essential hypertension   Diabetes mellitus, type 2 ([place])   Chronic diastolic CHF (congestive heart failure) ([place])   Mass of lower lobe of left lung   Chronic respiratory failure with hypoxia ([place])   Acute lower UTI  Brief Summary:- 80 y.o. female with medical history significant for COPD, previously known left lung mass diabetes mellitus, diastolic CHF, and prior Covid pneumonia infection admitted on 10/22/2020 after a fall with concerns for UTI   A/p 1) acute metabolic encephalopathy superimposed on underlying dementia--- suspect secondary to UTI -Continue IV Rocephin pending further culture data -Continue IV fluids until oral intake is more reliable -As per patient's daughter patient remains confused , disoriented and not back to baseline yet  2)Left lung mass--- discussed with Dr. Melvyn Novas pulmonologist, recommends outpatient PET scan and further work-up after discussing goals of care with family members  3) thyroid disorder--- continue methimazole and Coreg  4)HFpEF--- no acute exacerbation at this time, continue Coreg  5)DM2-recent A1c 5.8 reflecting excellent diabetic control PTA Use  Novolog/Humalog Sliding scale insulin with Accu-Cheks/Fingersticks as ordered   6)HTN-stable, continue amlodipine 2.5 mg daily along with Coreg 3.125 mg twice daily  7)chronic hypoxic respiratory failure secondary to underlying COPD and CHF--- continue supplemental oxygen continue to encourage patient to keep O2 on  Disposition/Need for in-Hospital Stay- patient unable to be discharged at this time due to UTI with acute metabolic encephalopathy requiring IV antibiotics and IV fluid*  Status is: Inpatient  Remains inpatient appropriate because:Please see above   Disposition: The patient is from: Home              Anticipated d/c is to: Home              Anticipated d/c date is: 2 days              Patient currently is not medically stable to d/c. Barriers: Not Clinically Stable-  Code Status : -  Code Status: Full Code   Family Communication:   NA (patient is alert, awake and coherent)   Consults  :  PCCM  DVT Prophylaxis  :   - SCDs  enoxaparin (LOVENOX) injection 40 mg Start: 10/22/20 2015    Lab Results  Component Value Date   PLT 157 10/22/2020    Inpatient Medications  Scheduled Meds: . amLODipine  2.5 mg Oral Daily  . atorvastatin  10 mg Oral Daily  . carvedilol  3.125 mg Oral BID  . enoxaparin (LOVENOX) injection  40 mg Subcutaneous Q24H  . insulin aspart  0-5 Units Subcutaneous QHS  . insulin aspart  0-9 Units Subcutaneous TID WC  . methimazole  5 mg Oral Daily  . mometasone-formoterol  2 puff Inhalation BID   Continuous Infusions: . sodium chloride 50 mL/hr at 10/23/20 0833  . cefTRIAXone (ROCEPHIN)  IV    . magnesium sulfate bolus IVPB 2 g (10/23/20 1518)   PRN Meds:.sodium chloride, acetaminophen **OR** acetaminophen, ipratropium-albuterol, polyethylene glycol    Anti-infectives (From admission, onward)   Start     Dose/Rate Route Frequency Ordered Stop   10/23/20 1200  cefTRIAXone (ROCEPHIN) 1 g in sodium chloride 0.9 % 100 mL IVPB        1 g 200  mL/hr over 30 Minutes Intravenous Every 24 hours 10/22/20 2245     10/22/20 1530  cefTRIAXone (ROCEPHIN) 1 g in sodium chloride 0.9 % 100 mL IVPB        1 g 200 mL/hr over 30 Minutes Intravenous  Once 10/22/20 1522 10/22/20 1648        Objective:   Vitals:   10/23/20 0500 10/23/20 0545 10/23/20 1054 10/23/20 1423  BP:  123/70 117/68 140/85  Pulse:  83 81 66  Resp:  20  16  Temp:    97.7 F (36.5 C)  TempSrc:    Oral  SpO2:   98% 97%  Weight: 76.2 kg     Height:        Wt Readings from Last 3 Encounters:  10/23/20 76.2 kg  09/22/20 74.9 kg  09/18/20 76 kg     Intake/Output Summary (Last 24 hours) at 10/23/2020 1537 Last data filed at 10/23/2020 0908 Gross per 24 hour  Intake 220 ml  Output 2500 ml  Net -2280 ml     Physical Exam  Gen:- Awake Alert,  In no apparent distress  HEENT:- [place].AT, No sclera icterus Neck-Supple Neck,No JVD,.  Lungs-  -diminished in bases, no wheeze CV- S1, S2 normal, regular  Abd-  +ve B.Sounds, Abd Soft, No tenderness,    Extremity/Skin:- No  edema, pedal pulses present  Psych-As per patient's daughter patient remains confused , disoriented and not back to baseline yet Neuro-generalized weakness no new focal deficits, no tremors   Data Review:   Micro Results Recent Results (from the past 240 hour(s))  Urine Culture     Status: None   Collection Time: 10/22/20 12:09 PM   Specimen: Urine, Clean Catch  Result Value Ref Range Status   Specimen Description   Final    URINE, CLEAN CATCH Performed at [facility], [address], [place], [place] [address]    Special Requests   Final    NONE Performed at [facility], [address], [place], [place] [address]    Culture   Final    NO GROWTH Performed at [facility], [place] [address], [place], [place] [address]    Report Status 10/23/2020 FINAL  Final  Resp Panel by RT-PCR (Flu A&B, Covid) Nasopharyngeal Swab     Status: None   Collection Time: 10/22/20  5:32 PM   Specimen:  Nasopharyngeal Swab; Nasopharyngeal(NP) swabs in vial transport medium  Result Value Ref Range Status   SARS Coronavirus 2 by RT PCR NEGATIVE NEGATIVE Final    Comment: (NOTE) SARS-CoV-2 target nucleic acids are NOT DETECTED.  The SARS-CoV-2 RNA is generally detectable in upper respiratory specimens during the acute phase of  infection. The lowest concentration of SARS-CoV-2 viral copies this assay can detect is 138 copies/mL. A negative result does not preclude SARS-Cov-2 infection and should not be used as the sole basis for treatment or other patient management decisions. A negative result may occur with  improper specimen collection/handling, submission of specimen other than nasopharyngeal swab, presence of viral mutation(s) within the areas targeted by this assay, and inadequate number of viral copies(<138 copies/mL). A negative result must be combined with clinical observations, patient history, and epidemiological information. The expected result is Negative.  Fact Sheet for Patients:  EntrepreneurPulse.com.au  Fact Sheet for Healthcare Providers:  IncredibleEmployment.be  This test is no t yet approved or cleared by the [place] FDA and  has been authorized for detection and/or diagnosis of SARS-CoV-2 by FDA under an Emergency Use Authorization (EUA). This EUA will remain  in effect (meaning this test can be used) for the duration of the COVID-19 declaration under Section 564(b)(1) of the Act, 21 U.S.C.section 360bbb-3(b)(1), unless the authorization is terminated  or revoked sooner.       Influenza A by PCR NEGATIVE NEGATIVE Final   Influenza B by PCR NEGATIVE NEGATIVE Final    Comment: (NOTE) The Xpert Xpress SARS-CoV-2/FLU/RSV plus assay is intended as an aid in the diagnosis of influenza from Nasopharyngeal swab specimens and should not be used as a sole basis for treatment. Nasal washings and aspirates are unacceptable for  Xpert Xpress SARS-CoV-2/FLU/RSV testing.  Fact Sheet for Patients: EntrepreneurPulse.com.au  Fact Sheet for Healthcare Providers: IncredibleEmployment.be  This test is not yet approved or cleared by the [place] FDA and has been authorized for detection and/or diagnosis of SARS-CoV-2 by FDA under an Emergency Use Authorization (EUA). This EUA will remain in effect (meaning this test can be used) for the duration of the COVID-19 declaration under Section 564(b)(1) of the Act, 21 U.S.C. section 360bbb-3(b)(1), unless the authorization is terminated or revoked.  Performed at [facility], [address] [place], [place] [address]     Radiology Reports DG Knee 1-2 Views Left  Result Date: 10/22/2020 CLINICAL DATA:  Fall. Lower leg edema and pain to left ankle and left knee. EXAM: LEFT KNEE - 1-2 VIEW COMPARISON:  11/22/2018 FINDINGS: No joint effusion identified. Moderate to marked medial compartment joint space narrowing with exuberant marginal spur formation. Mild joint space narrowing and marginal spur formation is noted involving the patellofemoral and lateral compartment. No fracture. Diffuse soft tissue swelling is noted about the knee. IMPRESSION: 1. No acute bone abnormality. 2. Tricompartmental osteoarthritis. 3. Soft tissue swelling. Electronically Signed   By: Kerby Moors M.D.   On: 10/22/2020 20:49   DG Ankle 2 Views Left  Result Date: 10/22/2020 CLINICAL DATA:  [place], lower leg edema, left ankle pain, osteoporosis EXAM: LEFT ANKLE - 2 VIEW COMPARISON:  None. FINDINGS: Frontal and cross-table lateral views of the left ankle are obtained. The bones are diffusely osteopenic. No acute displaced fractures. Alignment is anatomic. Mild tibiotalar osteoarthritis. Prominent inferior calcaneal spur. Diffuse soft tissue edema. IMPRESSION: 1. No acute displaced fracture. 2. Diffuse osteopenia. 3. Diffuse subcutaneous edema. Electronically Signed    By: Randa Ngo M.D.   On: 10/22/2020 20:50   CT Head Wo Contrast  Result Date: 10/22/2020 CLINICAL DATA:  Head trauma, minor. Poly trauma, critical, head/cervical spine injury suspected. EXAM: CT HEAD WITHOUT CONTRAST CT CERVICAL SPINE WITHOUT CONTRAST TECHNIQUE: Multidetector CT imaging of the head and cervical spine was performed following the standard protocol without intravenous contrast.  Multiplanar CT image reconstructions of the cervical spine were also generated. COMPARISON:  Head CT 08/15/2020. FINDINGS: CT HEAD FINDINGS Brain: Mildly motion degraded exam. Stable cerebral and cerebellar atrophy. Moderate ill-defined hypoattenuation within the cerebral white matter is nonspecific, but compatible with chronic small vessel ischemic disease. There is no acute intracranial hemorrhage. No demarcated cortical infarct. No extra-axial fluid collection. No evidence of intracranial mass. No midline shift. Vascular: No hyperdense vessel.  Atherosclerotic calcifications. Skull: Normal. Negative for fracture or focal lesion. Sinuses/Orbits: Visualized orbits show no acute finding. No significant paranasal sinus disease at the imaged levels. CT CERVICAL SPINE FINDINGS Alignment: Straightening of the expected cervical lordosis. No significant spondylolisthesis. Skull base and vertebrae: The basion-dental and atlanto-dental intervals are maintained.No evidence of acute fracture to the cervical spine. Soft tissues and spinal canal: No prevertebral fluid or swelling. No visible canal hematoma. Disc levels: Cervical spondylosis with multilevel disc space narrowing, disc bulges, posterior disc osteophytes, uncovertebral hypertrophy and facet arthrosis. Disc space narrowing is severe at C3-C4, C4-C5 and moderate/severe at C5-C6. Disc bulges and posterior disc osteophytes contribute to suspected severe spinal canal stenosis at C3-C4, C4-C5 and C5-C6. Multilevel bony neural foraminal narrowing. Upper chest: Emphysema. No  consolidation within the imaged lung apices. No visible pneumothorax. Other: Motion artifact limits evaluation of the thyroid gland. Thyromegaly with right thyroid lobe calcifications. IMPRESSION: CT head: 1. Mildly motion degraded exam. 2. No evidence of acute intracranial abnormality. 3. Stable generalized atrophy of the brain and chronic small vessel ischemic disease. CT cervical spine: 1. Mildly motion degraded exam. 2. No evidence of acute fracture to the cervical spine. 3. Nonspecific straightening of the expected cervical lordosis. 4. Advanced cervical spondylosis as described. Suspected severe spinal canal stenosis at C3-C4, C4-C5 and C5-C6. 5. Thyromegaly with right thyroid lobe calcifications. Please refer to prior thyroid ultrasound 03/13/2020 for further description of the gland. 6.  Emphysema (ICD10-J43.9). Electronically Signed   By: Kellie Simmering DO   On: 10/22/2020 12:25   CT Chest W Contrast  Result Date: 10/22/2020 CLINICAL DATA:  Mass seen on previous CT EXAM: CT CHEST WITH CONTRAST TECHNIQUE: Multidetector CT imaging of the chest was performed during intravenous contrast administration. CONTRAST:  64mL OMNIPAQUE IOHEXOL 300 MG/ML  SOLN COMPARISON:  Chest CT examinations December 14, 2015 and August 26, 2020; chest radiograph October 22, 2020 FINDINGS: Cardiovascular: The ascending thoracic aortic diameter measures 4.0 x 4.0 cm. No evident thoracic aortic dissection. There is extensive aortic atherosclerosis throughout multiple visualized great vessels. There is aortic atherosclerosis. There are multiple foci of coronary artery calcification. There is cardiomegaly. No pericardial effusion or pericardial thickening. No major vessel pulmonary embolus evident. Note that the timing bolus was not triggered to optimize pulmonary arterial vascular visualization. There is prominence of the main pulmonary outflow tract measuring 3.6 cm in diameter. Mediastinum/Nodes: The thyroid is again noted to be  diffusely enlarged. There is extensive calcification in the enlarged right lobe with a mass occupying much of the right lobe, unchanged from the 2017 study. Thyroid appears stable for essentially a 5 year time span which is indicative of benign etiology and does not warrant additional imaging surveillance unless clinical symptoms so warrant. No evident thoracic adenopathy.  No esophageal lesions are evident. Lungs/Pleura: There is an irregular mass in the superior segment of the left lower lobe, similar in appearance compared to prior CT examination approximately 2 months prior. This mass measures 3.3 x 2.7 cm. There are areas of decreased attenuation centrally, likely representing a degree  of central necrosis within this apparent neoplasm. There is atelectatic change in the lung bases, stable. There is underlying centrilobular and paraseptal emphysematous change. No pleural effusions. No pneumothorax. Trachea and major bronchial structures appear normal. Upper Abdomen: There is upper abdominal aortic atherosclerosis. There is hepatic steatosis. Left adrenal hypertrophy is incompletely visualized, stable in appearance. Musculoskeletal: There are foci of degenerative change in the thoracic spine. No blastic or lytic bone lesions. No chest wall lesions. IMPRESSION: 1. Slightly irregular mass in the superior segment of the left lower lobe with apparent focus of central necrosis. Appearance consistent with neoplastic lesion. Consultation with pulmonary medicine or thoracic surgery advised. Tissue sampling may well be warranted given the appearance of this lesion. 2. Atelectatic change in the lung bases, more severe on the right than on the left. Underlying emphysematous changes. 3.  No appreciable adenopathy. 4. Ascending thoracic aorta measures 4.0 x 4.0 cm in diameter. Recommend annual imaging followup by CTA or MRA. This recommendation follows 2010 ACCF/AHA/AATS/ACR/ASA/SCA/SCAI/SIR/STS/SVM Guidelines for the  Diagnosis and Management of Patients with Thoracic Aortic Disease. Circulation. 2010; 121: V371-G626. Aortic aneurysm NOS (ICD10-I71.9). No dissection evident. Aortic atherosclerosis noted. There are foci of calcification great vessels and coronary arteries. 5. Stable enlarged thyroid with masses, primarily in the right lobe. Stability has been documented for essentially 5 year time span. Additional imaging surveillance not warranted per consensus guidelines. 6.  Hepatic steatosis. Aortic Atherosclerosis (ICD10-I70.0) and Emphysema (ICD10-J43.9). Electronically Signed   By: Lowella Grip III M.D.   On: 10/22/2020 14:31   CT Cervical Spine Wo Contrast  Result Date: 10/22/2020 CLINICAL DATA:  Head trauma, minor. Poly trauma, critical, head/cervical spine injury suspected. EXAM: CT HEAD WITHOUT CONTRAST CT CERVICAL SPINE WITHOUT CONTRAST TECHNIQUE: Multidetector CT imaging of the head and cervical spine was performed following the standard protocol without intravenous contrast. Multiplanar CT image reconstructions of the cervical spine were also generated. COMPARISON:  Head CT 08/15/2020. FINDINGS: CT HEAD FINDINGS Brain: Mildly motion degraded exam. Stable cerebral and cerebellar atrophy. Moderate ill-defined hypoattenuation within the cerebral white matter is nonspecific, but compatible with chronic small vessel ischemic disease. There is no acute intracranial hemorrhage. No demarcated cortical infarct. No extra-axial fluid collection. No evidence of intracranial mass. No midline shift. Vascular: No hyperdense vessel.  Atherosclerotic calcifications. Skull: Normal. Negative for fracture or focal lesion. Sinuses/Orbits: Visualized orbits show no acute finding. No significant paranasal sinus disease at the imaged levels. CT CERVICAL SPINE FINDINGS Alignment: Straightening of the expected cervical lordosis. No significant spondylolisthesis. Skull base and vertebrae: The basion-dental and atlanto-dental intervals  are maintained.No evidence of acute fracture to the cervical spine. Soft tissues and spinal canal: No prevertebral fluid or swelling. No visible canal hematoma. Disc levels: Cervical spondylosis with multilevel disc space narrowing, disc bulges, posterior disc osteophytes, uncovertebral hypertrophy and facet arthrosis. Disc space narrowing is severe at C3-C4, C4-C5 and moderate/severe at C5-C6. Disc bulges and posterior disc osteophytes contribute to suspected severe spinal canal stenosis at C3-C4, C4-C5 and C5-C6. Multilevel bony neural foraminal narrowing. Upper chest: Emphysema. No consolidation within the imaged lung apices. No visible pneumothorax. Other: Motion artifact limits evaluation of the thyroid gland. Thyromegaly with right thyroid lobe calcifications. IMPRESSION: CT head: 1. Mildly motion degraded exam. 2. No evidence of acute intracranial abnormality. 3. Stable generalized atrophy of the brain and chronic small vessel ischemic disease. CT cervical spine: 1. Mildly motion degraded exam. 2. No evidence of acute fracture to the cervical spine. 3. Nonspecific straightening of the expected cervical lordosis.  4. Advanced cervical spondylosis as described. Suspected severe spinal canal stenosis at C3-C4, C4-C5 and C5-C6. 5. Thyromegaly with right thyroid lobe calcifications. Please refer to prior thyroid ultrasound 03/13/2020 for further description of the gland. 6.  Emphysema (ICD10-J43.9). Electronically Signed   By: Kellie Simmering DO   On: 10/22/2020 12:25   CT Hip Left Wo Contrast  Result Date: 10/22/2020 CLINICAL DATA:  Left hip pain since a fall 10/16/2020. Initial encounter. EXAM: CT OF THE LEFT HIP WITHOUT CONTRAST TECHNIQUE: Multidetector CT imaging of the left hip was performed according to the standard protocol. Multiplanar CT image reconstructions were also generated. COMPARISON:  Plain films left hip today. FINDINGS: Bones/Joint/Cartilage There is no fracture, dislocation or other acute bony  or joint abnormality. Mild degenerative change at the symphysis pubis, left hip and left SI joint noted. No avascular necrosis of the left femoral head. Joint space is preserved. Mild chondrocalcinosis noted. Ligaments Suboptimally assessed by CT. Muscles and Tendons Intact. Soft tissues No acute or focal abnormality. Infiltration of subcutaneous fat lateral to the left hip could be due to contusion. IMPRESSION: No acute bony or joint abnormality. Infiltration of subcutaneous fat lateral to the left hip may be due to soft tissue contusion. Electronically Signed   By: Inge Rise M.D.   On: 10/22/2020 13:58   DG Chest Port 1 View  Result Date: 10/22/2020 CLINICAL DATA:  Pain following fall EXAM: PORTABLE CHEST 1 VIEW COMPARISON:  Chest radiograph and chest CT August 26, 2020 FINDINGS: Underlying emphysematous changes again noted. There is mild right base atelectasis. The left lower lobe mass seen on most recent CT is not appreciable by radiography. There is cardiomegaly with pulmonary vascularity within normal limits. No evident adenopathy. There is aortic atherosclerosis. Bones are osteoporotic. IMPRESSION: 1. The mass in the left lower lobe seen on prior CT is not appreciable on current radiographic examination. Given concern for potential neoplasm based on the appearance of this mass on prior CT, follow-up chest CT, ideally with intravenous contrast, at this time is advised to further evaluate. 2. No edema or airspace opacity is evident. There is underlying emphysematous change. There is mild right base atelectasis. 3.  Stable cardiomegaly. 4.  Aortic Atherosclerosis (ICD10-I70.0). Electronically Signed   By: Lowella Grip III M.D.   On: 10/22/2020 11:15   DG HIPS BILAT WITH PELVIS MIN 5 VIEWS  Result Date: 10/22/2020 CLINICAL DATA:  Fall, left hip pain EXAM: DG HIP (WITH OR WITHOUT PELVIS) 5+V BILAT COMPARISON:  09/04/2020, 08/15/2020 FINDINGS: Osseous structures are diffusely demineralized.  Possible cortical irregularity in the subcapital region of the proximal left femur, not well characterized given lack of frog-lateral view. Cross-table lateral view is limited by overlapping structures. Right hip joint appears intact without fracture or dislocation. Sacrum is largely obscured by overlying stool and bowel gas. No evidence of pelvic diastasis. IMPRESSION: Possible cortical irregularity in the subcapital region of the proximal left femur, not well characterized. A CT scan of the left hip is recommended for further evaluation. Electronically Signed   By: Davina Poke D.O.   On: 10/22/2020 12:03     CBC Recent Labs  Lab 10/22/20 1032  WBC 5.5  HGB 11.1*  HCT 37.4  PLT 157  MCV 87.8  MCH 26.1  MCHC 29.7*  RDW 16.2*  LYMPHSABS 0.7  MONOABS 0.5  EOSABS 0.0  BASOSABS 0.0    Chemistries  Recent Labs  Lab 10/22/20 1032 10/23/20 0423  NA 144 145  K 3.3* 2.7*  CL 102 99  CO2 28 32  GLUCOSE 81 79  BUN 15 13  CREATININE 0.76 0.73  CALCIUM 9.0 8.8*  MG  --  1.6*  AST 24  --   ALT 14  --   ALKPHOS 48  --   BILITOT 1.2  --    ------------------------------------------------------------------------------------------------------------------ No results for input(s): CHOL, HDL, LDLCALC, TRIG, CHOLHDL, LDLDIRECT in the last 72 hours.  Lab Results  Component Value Date   HGBA1C 5.8 (H) 08/29/2020   ------------------------------------------------------------------------------------------------------------------ No results for input(s): TSH, T4TOTAL, T3FREE, THYROIDAB in the last 72 hours.  Invalid input(s): FREET3 ------------------------------------------------------------------------------------------------------------------ No results for input(s): VITAMINB12, FOLATE, FERRITIN, TIBC, IRON, RETICCTPCT in the last 72 hours.  Coagulation profile No results for input(s): INR, PROTIME in the last 168 hours.  No results for input(s): DDIMER in the last 72  hours.  Cardiac Enzymes No results for input(s): CKMB, TROPONINI, MYOGLOBIN in the last 168 hours.  Invalid input(s): CK ------------------------------------------------------------------------------------------------------------------    Component Value Date/Time   BNP 207.0 (H) 10/22/2020 1033   BNP 112.8 (H) 03/22/2013 1707   Roxan Hockey M.D on 10/23/2020 at 3:37 PM  Go to [email or web address] - for contact info  [facility] - Office  [phone]

## 2020-10-23 NOTE — Consult Note (Signed)
NAME:  Sonya Oliver, MRN:  [id number], DOB:  08/07/1941, LOS: 0 ADMISSION DATE:  10/22/2020, CONSULTATION DATE:  2/24 REFERRING MD:  Margot Chimes  CHIEF COMPLAINT:  Lung nodule    Brief History:  36 yobf remote smoker with GOLD IV criteria in 08/2017 for copd admitted with  FTT and weakness with incidental LLL nodule for which supposed to be seen in pulmonary clinic 2/25 but PCCM asked to eval as inpt instead.   History of Present Illness:  80 y.o. female with medical history significant for COPD, diabetes mellitus, diastolic CHF, JJHER-74 pneumonia. Patient was brought to the ED via EMS with reports of weakness and leg swelling, patient also was not talking or feeding herself.  Patient had a fall 2 days ago, but patient declined coming to the ED.   t at times her speech is tangential/confused. She reports she fell on her left side, and  having severe pain on her left knee and left ankle.  She denies chest pain, denies difficulty breathing.    Patient lives with patient's son.  But patient's daughter [name] is designated healthcare power of attorney.  Patient's daughter reported  patient fell 2 days PTA , but since then she has been able to ambulate with a walker, she has been able to feed herself.  She reports history of memory problems and diagnosis of dementia about 2 months PTA She tells me patient speech is at times confused, saying and seeing things that are not actually there, but this is her baseline. Patient is on O2 at baseline, but patient uses it as needed sometimes in the morning and sometimes in the afternoon, and patient sometimes has low oxygen saturations at home. She reports chronic lower extremity swelling, fluctuates in severity, she feels patient's legs are more swollen now but she is unable to tell duration.  She reports patient is noncompliant with her Lasix.  ED Course: Temperature 98.2.  Heart rate 60s to 90s.  Respiratory rate 18-26.  Blood pressure systolic 081-448.   O2 sats 86% on room air, currently on 2 L nasal cannula.  Potassium 3.3, stable creatinine 0.76.  UA with moderate leukocytes rare bacteria 11-20 WBCs.  BNP 207.  Chest CT shows irregular mass superior left lower lobe with focus of central necrosis consistent with neoplastic lesion.  Pulmonology/ cardiothoracic surgery consult advised.  Atelectasis R > L.  Enlarged but stable thyroid masses. 80 mg IV Lasix given.  IV ceftriaxone started for UTI.  Hospitalist to admit for AMS.     Significant Hospital Events:     Consults:  PCCM 2/24   Procedures:     Significant Diagnostic Tests:   Ct chst 2/23 with contrast: 1. Slightly irregular mass in the superior segment of the left lowerlobe with apparent focus of central necrosis. Appearance consistent with neoplastic lesion/ abuts Post chest wall.  Micro Data:   UC 2/23 > ng Resp viral panel PCR  2/23> neg covid/ flu  Antimicrobials:  Rocephin 2/23 >>>  Scheduled Meds: . amLODipine  2.5 mg Oral Daily  . atorvastatin  10 mg Oral Daily  . carvedilol  3.125 mg Oral BID  . enoxaparin (LOVENOX) injection  40 mg Subcutaneous Q24H  . insulin aspart  0-5 Units Subcutaneous QHS  . insulin aspart  0-9 Units Subcutaneous TID WC  . methimazole  5 mg Oral Daily  . mometasone-formoterol  2 puff Inhalation BID   Continuous Infusions: . sodium chloride 50 mL/hr at 10/23/20 0833  . cefTRIAXone (  ROCEPHIN)  IV    . magnesium sulfate bolus IVPB     PRN Meds:.sodium chloride, acetaminophen **OR** acetaminophen, ipratropium-albuterol, polyethylene glycol    Interim History / Subjective:  Sitting in chair, oriented to person only - nad  Objective   Blood pressure 117/68, pulse 81, temperature 97.8 F (36.6 C), resp. rate 20, height 5\' 3"  (1.6 m), weight 76.2 kg, SpO2 98 %.        Intake/Output Summary (Last 24 hours) at 10/23/2020 1258 Last data filed at 10/23/2020 0500 Gross per 24 hour  Intake 100 ml  Output 2500 ml  Net -2400 ml   Filed  Weights   10/22/20 1014 10/22/20 1803 10/23/20 0500  Weight: 74.9 kg 76.2 kg 76.2 kg    Examination: Tmx 98.8    And on 2lpm sats 98%  General: elderly bf, sometimes just answers questions with blank stare HENT: clear  Lungs: decreased bs, no exp wheeze Cardiovascular: RRR no s3 Abdomen: soft/ benign Extremities: warm s calf tenderness/ trace ptting sym  Bilaterally  Neuro: oriented only to person, did not identify me as a doctor but did remember 5 min p informed did then recognize me as doctor but not why I was seeing her.     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   - ddx = lung abscess but no h/o dental work or aspiration or signifant fever  >>> best approach is ether palliative or if fm insists "need to know" then PET and f/u with IR bx but would only pursue this you can restore some degree of improvement in FTT symptoms which is why she was admitted in first place.  2) C OPD gold iv/ 02 dep with borderline hypercardia appears near endstage to me in this very sedentary lady  >>> rx anoro as good as anything and easier to teach to the elderly.     Labs   CBC: Recent Labs  Lab 10/22/20 1032  WBC 5.5  NEUTROABS 4.2  HGB 11.1*  HCT 37.4  MCV 87.8  PLT 836    Basic Metabolic Panel: Recent Labs  Lab 10/22/20 1032 10/23/20 0423  NA 144 145  K 3.3* 2.7*  CL 102 99  CO2 28 32  GLUCOSE 81 79  BUN 15 13  CREATININE 0.76 0.73  CALCIUM 9.0 8.8*  MG  --  1.6*   GFR: Estimated Creatinine Clearance: 53.9 mL/min (by C-G formula based on SCr of 0.73 mg/dL). Recent Labs  Lab 10/22/20 1032  WBC 5.5    Liver Function Tests: Recent Labs  Lab 10/22/20 1032  AST 24  ALT 14  ALKPHOS 48  BILITOT 1.2  PROT 7.4  ALBUMIN 2.9*   No results for input(s): LIPASE, AMYLASE in the last 168 hours. No results for input(s): AMMONIA in the last 168 hours.  ABG    Component Value Date/Time   PHART  7.316 (L) 12/03/2016 0025   PCO2ART 61.6 (H) 12/03/2016 0025   PO2ART 73.3 (L) 12/03/2016 0025   HCO3 27.2 12/03/2016 0025   TCO2 15.8 12/14/2015 1245   O2SAT 93.3 12/03/2016 0025     Coagulation Profile: No results for input(s): INR, PROTIME in the last 168 hours.  Cardiac Enzymes: No results for input(s): CKTOTAL, CKMB, CKMBINDEX, TROPONINI in the last 168 hours.  HbA1C: HB A1C (BAYER DCA - WAIVED)  Date/Time Value Ref Range Status  05/16/2020 03:36 PM 6.2 <7.0 % Final  Comment:                                          Diabetic Adult            <7.0                                       Healthy Adult        4.3 - 5.7                                                           (DCCT/NGSP) American Diabetes Association's Summary of Glycemic Recommendations for Adults with Diabetes: Hemoglobin A1c <7.0%. More stringent glycemic goals (A1c <6.0%) may further reduce complications at the cost of increased risk of hypoglycemia.   01/09/2020 10:27 AM 6.4 <7.0 % Final    Comment:                                          Diabetic Adult            <7.0                                       Healthy Adult        4.3 - 5.7                                                           (DCCT/NGSP) American Diabetes Association's Summary of Glycemic Recommendations for Adults with Diabetes: Hemoglobin A1c <7.0%. More stringent glycemic goals (A1c <6.0%) may further reduce complications at the cost of increased risk of hypoglycemia.    Hgb A1c MFr Bld  Date/Time Value Ref Range Status  08/29/2020 09:54 AM 5.8 (H) 4.8 - 5.6 % Final    Comment:    (NOTE) Pre diabetes:          5.7%-6.4%  Diabetes:              >6.4%  Glycemic control for   <7.0% adults with diabetes     CBG: Recent Labs  Lab 10/22/20 2132 10/23/20 0816 10/23/20 1138  GLUCAP 129* 72 120*       Past Medical History:  She,  has a past medical history of Arthritis, Bladder cancer ([place]) (2015), Cataract, CHF  (congestive heart failure) ([place]), COPD with emphysema ([place]), H/O pulmonary edema, Hypertension, Hyperthyroidism, Nocturia, Nocturnal oxygen desaturation, Osteoporosis, Stroke ([place]), Type 2 diabetes mellitus ([place]), and Ureteral tumor.   Surgical History:   Past Surgical History:  Procedure Laterality Date  . ABDOMINAL HYSTERECTOMY  1980'S   W/ BILATERAL SALPINGO-OOPHORECTOMY  . CATARACT EXTRACTION W/PHACO Left 01/31/2014   Procedure: CATARACT EXTRACTION PHACO AND INTRAOCULAR LENS PLACEMENT (IOC);  Surgeon: Tonny Branch, MD;  Location: [facility];  Service: Ophthalmology;  Laterality: Left;  CDE: 23.78  . CATARACT EXTRACTION W/PHACO Right 02/28/2014   Procedure: CATARACT EXTRACTION PHACO AND INTRAOCULAR LENS PLACEMENT (IOC);  Surgeon: Tonny Branch, MD;  Location: [facility];  Service: Ophthalmology;  Laterality: Right;  CDE:  11.34  . CYSTOSCOPY  03/23/2012   Procedure: CYSTOSCOPY;  Surgeon: Claybon Jabs, MD;  Location: [facility];  Service: Urology;  Laterality: N/A;  . CYSTOSCOPY WITH RETROGRADE PYELOGRAM, URETEROSCOPY AND STENT PLACEMENT Right 10/21/2014   Procedure: CYSTOSCOPY WITH RETROGRADE PYELOGRAM, URETEROSCOPY, BLADDER TUMOR BIOPSY;  Surgeon: Claybon Jabs, MD;  Location: [facility];  Service: Urology;  Laterality: Right;  . TRANSTHORACIC ECHOCARDIOGRAM  06-21-2013   moderate LVH,  grade I diastolic dysfunction, ef 38-18%,  mild MR & TR  . TRANSURETHRAL RESECTION OF BLADDER TUMOR  03/23/2012   Procedure: TRANSURETHRAL RESECTION OF BLADDER TUMOR (TURBT);  Surgeon: Claybon Jabs, MD;  Location: [facility];  Service: Urology;  Laterality: N/A;  . TRANSURETHRAL RESECTION OF BLADDER TUMOR N/A 01/08/2013   Procedure: TRANSURETHRAL RESECTION OF BLADDER TUMOR (TURBT);  Surgeon: Claybon Jabs, MD;  [facility];  Service: Urology;  Laterality: N/A;     Social History:   reports that she quit smoking about 14 years ago. Her smoking use included cigarettes. She has a 12.50  pack-year smoking history. She has never used smokeless tobacco. She reports that she does not drink alcohol and does not use drugs.   Family History:  Her family history includes Asthma in her brother; COPD in her father; Cancer in her brother; Congestive Heart Failure in her father; Diabetes in her daughter, father, mother, and son; Heart attack in her brother and mother; Hyperlipidemia in her mother; Hypertension in her brother, father, and mother; Kidney disease in her father and mother; Stroke in her mother.   Allergies No Known Allergies   Home Medications  Prior to Admission medications   Medication Sig Start Date End Date Taking? Authorizing Provider  albuterol (VENTOLIN HFA) 108 (90 Base) MCG/ACT inhaler INHALE 1-2 PUFFS BY MOUTH EVERY 6 HOURS AS NEEDED FOR WHEEZE OR SHORTNESS OF BREATH Patient taking differently: Inhale 1-2 puffs into the lungs every 6 (six) hours as needed for wheezing or shortness of breath. 10/06/20  Yes Hendricks Limes F, FNP  amLODipine (NORVASC) 2.5 MG tablet Take 1 tablet (2.5 mg total) by mouth daily. 09/18/20  Yes Hendricks Limes F, FNP  atorvastatin (LIPITOR) 10 MG tablet TAKE 1 TABLET BY MOUTH EVERY DAY Patient taking differently: Take 10 mg by mouth daily. 08/25/20  Yes Claretta Fraise, MD  carvedilol (COREG) 3.125 MG tablet Take 1 tablet (3.125 mg total) by mouth 2 (two) times daily. 08/25/20  Yes Stacks, Cletus Gash, MD  Fluticasone-Salmeterol (ADVAIR DISKUS) 100-50 MCG/DOSE AEPB Inhale 1 puff into the lungs 2 (two) times daily. 05/16/20  Yes Hendricks Limes F, FNP  furosemide (LASIX) 40 MG tablet Take 1 tablet (40 mg total) by mouth daily. 09/18/20  Yes Hendricks Limes F, FNP  lisinopril (ZESTRIL) 40 MG tablet Take 1 tablet (40 mg total) by mouth daily. 08/07/20  Yes Gwenlyn Perking, FNP  metFORMIN (GLUCOPHAGE) 500 MG tablet Take 1 tablet (500 mg total) by mouth daily. 07/10/20  Yes Gwenlyn Perking, FNP  methimazole (TAPAZOLE) 10 MG tablet Take 0.5 tablets (5 mg  total) by mouth daily. 09/22/20  Yes Reardon, Juanetta Beets, NP  potassium chloride SA (KLOR-CON) 20 MEQ tablet Take 1 tablet (20 mEq total) by mouth  daily. 08/25/20  Yes Claretta Fraise, MD  albuterol (PROVENTIL) (2.5 MG/3ML) 0.083% nebulizer solution Inhale 3 mLs into the lungs 4 (four) times daily as needed for shortness of breath. 02/19/19   Terald Sleeper, PA-C  alendronate (FOSAMAX) 70 MG tablet TAKE 1 TABLET ONCE A WEEK. TAKE WITH A FULL GLASS OF WATER ON AN EMPTY STOMACH. Patient not taking: Reported on 10/22/2020 10/07/20   Loman Brooklyn, FNP  Cholecalciferol (VITAMIN D3) 2000 UNITS TABS Take 1 tablet by mouth daily. Patient not taking: Reported on 10/22/2020    [provider]  cycloSPORINE (RESTASIS MULTIDOSE) 0.05 % ophthalmic emulsion Place 1 drop into both eyes 2 (two) times daily. Patient not taking: Reported on 10/22/2020 04/23/20   Loman Brooklyn, FNP  guaiFENesin-dextromethorphan (ROBITUSSIN DM) 100-10 MG/5ML syrup Take 10 mLs by mouth every 4 (four) hours as needed for cough. Patient not taking: Reported on 10/22/2020 09/01/20   Heath Lark D, DO  OXYGEN Inhale 2 L into the lungs as needed.    [provider]  pantoprazole (PROTONIX) 40 MG tablet Take 1 tablet (40 mg total) by mouth daily for 10 days. 09/18/20 09/28/20  Loman Brooklyn, FNP      Christinia Gully, MD [facility] [place] [phone]   After 7:00 pm call Elink  [phone]

## 2020-10-23 NOTE — Progress Notes (Signed)
MD notified of patients critical potassium lab value of 2.7.

## 2020-10-23 NOTE — Progress Notes (Signed)
Pt calling out from room. Into room to find pt sitting up in bed. She is asking for, "That young one. Tell her I said get in here right now!" Pt unable to tell me who she is speaking of. Pt has taken her O2 off and when asked, she says, "Y'all took it off.  Y'all know what happened to it!" Restless, pointing to doorway and asking for, "that little boy was standing there. Where is he?" Advised pt that we did not see any children but pt is adamant. Pt restless and agitated, sitting up in bed, rocking back and forth and mumbling to self. Did allow O2 to be replaced, but refuses to let us check her SaO2. Does not want to be touched, jerks away from staff. RT called to come evaluate pt and see if needs PRN nebulizer. MD Courage notified of pt's current behavior.

## 2020-10-23 NOTE — Evaluation (Signed)
Physical Therapy Evaluation Patient Details Name: Sonya Oliver MRN: [id number] DOB: 1940/12/25 Today's Date: 10/23/2020   History of Present Illness  Sonya Oliver is a 80 y.o. female with medical history significant for COPD, diabetes mellitus, diastolic CHF, [id number] pneumonia.  Patient was brought to the ED via EMS with reports of weakness and leg swelling, patient also was not talking or feeding herself.  Patient had a fall 2 days ago, but patient declined coming to the ED.     At the time of my evaluation patient is awake, history is limited, patient answers some questions accurately but at times her speech is tangential/confused. She reports she fell on her left side, and tells me she is having severe pain on her left knee and left ankle.  She denies chest pain, denies difficulty breathing.  I talked to patient's daughter Sonya Oliver, and reports she gives me conflicts with ED providers history obtained from patient's son.  Patient lives with patient's son.  But patient's daughter Sonya Oliver is designated healthcare power of attorney.  Patient's daughter tells me that patient fell 2 days ago, but since then she has been able to ambulate with a walker, she has been able to feed herself.  She reports history of memory problems and diagnosis of dementia about 2 months ago. She tells me patient speech is at times confused, saying and seeing things that are not actually there, but this is her baseline.  Patient is on O2 at baseline, but patient uses it as needed sometimes in the morning and sometimes in the afternoon, and patient sometimes has low oxygen saturations at home.  She reports chronic lower extremity swelling, fluctuates in severity, she feels patient's legs are more swollen now but she is unable to tell duration.  She reports patient is noncompliant with her Lasix.    Clinical Impression  Patient demonstrates slow labored movement for sitting up at bedside with difficulty moving legs due to  swelling, HOB raised and fair/good return for scooting self forward with verbal cues.  Patient put on room air and SpO2 dropped from 93% to 85% and put back on 2 LPM O2 for gait training.  Patient demonstrates slow labored movement for taking steps, limited to bedside due to fatigue and fair/poor standing balance using RW.  Patient tolerated sitting up in chair after therapy with her daughter present at bedside - RN notified.  Patient will benefit from continued physical therapy in hospital and recommended venue below to increase strength, balance, endurance for safe ADLs and gait.     Follow Up Recommendations Home health PT;Supervision for mobility/OOB;Supervision - Intermittent    Equipment Recommendations  None recommended by PT    Recommendations for Other Services       Precautions / Restrictions Precautions Precautions: Fall Restrictions Weight Bearing Restrictions: No      Mobility  Bed Mobility Overal bed mobility: Needs Assistance Bed Mobility: Supine to Sit     Supine to sit: Min guard;HOB elevated;Min assist     General bed mobility comments: increased time labored movement    Transfers Overall transfer level: Needs assistance Equipment used: Rolling walker (2 wheeled) Transfers: Sit to/from [facility] Sit to Stand: Min assist;Mod assist Stand pivot transfers: Min assist;Mod assist       General transfer comment: slow labored movement  Ambulation/Gait Ambulation/Gait assistance: Mod assist Gait Distance (Feet): 12 Feet Assistive device: Rolling walker (2 wheeled) Gait Pattern/deviations: Decreased step length - right;Decreased step length - left;Decreased stride length;Trunk flexed  Gait velocity: decreased   General Gait Details: slow labored unsteady cadence with frequent looking down, limited to taking steps at bedside due to c/o fatigue, on 2 LPM with SpO2 at 88-91%  Stairs            Wheelchair Mobility    Modified Rankin  (Stroke Patients Only)       Balance Overall balance assessment: Needs assistance Sitting-balance support: Feet supported;No upper extremity supported Sitting balance-Leahy Scale: Fair Sitting balance - Comments: fair/good seated at EOB   Standing balance support: During functional activity;Bilateral upper extremity supported Standing balance-Leahy Scale: Poor Standing balance comment: fair/poor using RW                             Pertinent Vitals/Pain Pain Assessment: No/denies pain    Home Living Family/patient expects to be discharged to:: Private residence Living Arrangements: Children Available Help at Discharge: Family;Available 24 hours/day Type of Home: Mobile home Home Access: Stairs to enter Entrance Stairs-Rails: Right;Left (to wide to reach both) Entrance Stairs-Number of Steps: 6 Home Layout: One level Home Equipment: Walker - 4 wheels;Walker - 2 wheels;Shower seat Additional Comments: Patient is staying with her son, information per patient's daughter    Prior Function Level of Independence: Needs assistance   Gait / Transfers Assistance Needed: household Geologist, engineering, on Home O2 PRN  ADL's / Homemaking Assistance Needed: assisted by family        Hand Dominance        Extremity/Trunk Assessment   Upper Extremity Assessment Upper Extremity Assessment: Generalized weakness    Lower Extremity Assessment Lower Extremity Assessment: Generalized weakness    Cervical / Trunk Assessment Cervical / Trunk Assessment: Normal  Communication   Communication: No difficulties (poor historian)  Cognition Arousal/Alertness: Awake/alert Behavior During Therapy: WFL for tasks assessed/performed Overall Cognitive Status: History of cognitive impairments - at baseline                                        General Comments      Exercises     Assessment/Plan    PT Assessment Patient needs continued PT services   PT Problem List Decreased strength;Decreased activity tolerance;Decreased balance;Decreased mobility       PT Treatment Interventions DME instruction;Gait training;Stair training;Functional mobility training;Therapeutic activities;Therapeutic exercise;Patient/family education;Balance training    PT Goals (Current goals can be found in the Care Plan section)  Acute Rehab PT Goals Patient Stated Goal: return home with family to assist PT Goal Formulation: With patient/family Time For Goal Achievement: 10/30/20 Potential to Achieve Goals: Good    Frequency Min 3X/week   Barriers to discharge        Co-evaluation               AM-PAC PT "6 Clicks" Mobility  Outcome Measure Help needed turning from your back to your side while in a flat bed without using bedrails?: A Little Help needed moving from lying on your back to sitting on the side of a flat bed without using bedrails?: A Little Help needed moving to and from a bed to a chair (including a wheelchair)?: A Lot Help needed standing up from a chair using your arms (e.g., wheelchair or bedside chair)?: A Little Help needed to walk in hospital room?: A Lot Help needed climbing 3-5 steps with a railing? : A  Lot 6 Click Score: 15    End of Session Equipment Utilized During Treatment: Oxygen Activity Tolerance: Patient tolerated treatment well;Patient limited by fatigue Patient left: in chair;with call bell/phone within reach;with family/visitor present Nurse Communication: Mobility status PT Visit Diagnosis: Other abnormalities of gait and mobility (R26.89);Unsteadiness on feet (R26.81)    Time: 0277-4128 PT Time Calculation (min) (ACUTE ONLY): 36 min   Charges:   PT Evaluation $PT Eval Moderate Complexity: 1 Mod PT Treatments $Therapeutic Activity: 23-37 mins        1:54 PM, 10/23/20 Lonell Grandchild, MPT Physical Therapist with [facility] 336 [phone] office [phone] mobile phone

## 2020-10-24 ENCOUNTER — Institutional Professional Consult (permissible substitution): Payer: Medicare Other | Admitting: Internal Medicine

## 2020-10-24 LAB — GLUCOSE, CAPILLARY
Glucose-Capillary: 83 mg/dL (ref 70–99)
Glucose-Capillary: 78 mg/dL (ref 70–99)
Glucose-Capillary: 70 mg/dL (ref 70–99)
Glucose-Capillary: 86 mg/dL (ref 70–99)

## 2020-10-24 MED ORDER — INSULIN ASPART 100 UNIT/ML ~~LOC~~ SOLN
0.0000 [IU] | Freq: Three times a day (TID) | SUBCUTANEOUS | Status: DC
  Administered 2020-10-25: 1 [IU] via SUBCUTANEOUS

## 2020-10-24 MED ORDER — POTASSIUM CHLORIDE CRYS ER 20 MEQ PO TBCR: 40.0000 meq | EXTENDED_RELEASE_TABLET | Freq: Once | ORAL | Status: DC

## 2020-10-24 MED ORDER — KCL IN DEXTROSE-NACL 40-5-0.45 MEQ/L-%-% IV SOLN: INTRAVENOUS | Status: AC

## 2020-10-24 NOTE — Progress Notes (Signed)
Patient Demographics:    Sonya Oliver, is a 80 y.o. female, DOB - [birth year], YEB:[id number]  Admit date - 10/22/2020   Admitting Physician Roxan Hockey, MD  Outpatient Primary MD for the patient is Loman Brooklyn, FNP  LOS - 1   Chief Complaint  Patient presents with  . Weakness        Subjective:    Sonya Oliver today has no fevers, no emesis,  No chest pain,    -Patient's daughter [name] states that this level of confusion or psychosis is different from patient's baseline  Assessment  & Plan :    Principal Problem:   Fall at home, initial encounter Active Problems:   Edema leg   COPD (chronic obstructive pulmonary disease) ([place])   Essential hypertension   Diabetes mellitus, type 2 (HCC)   Chronic diastolic CHF (congestive heart failure) (HCC)   Mass of lower lobe of left lung   Chronic respiratory failure with hypoxia ([place])   Acute lower UTI   Acute metabolic encephalopathy  Brief Summary:- 80 y.o. female with medical history significant for COPD, previously known left lung mass diabetes mellitus, diastolic CHF, and prior Covid pneumonia infection admitted on 10/22/2020 after a fall with worsening confusion and behavior problems-Patient's daughter [name] states that this level of confusion or psychosis is different from patient's baseline   A/p 1) acute metabolic encephalopathy superimposed on underlying dementia--- -Patient's daughter [name] states that this level of confusion or psychosis is different from patient's baseline -Urine culture NGTD okay to stop IV Rocephin -Continue IV fluids until oral intake is more reliable -CT head and CT C-spine without acute findings --This may be progression of patient's underlying dementia   2)Left lung mass--- discussed with Dr. Melvyn Novas pulmonologist, recommends outpatient PET scan and further work-up after discussing goals of  care with family members -HCPOA believes that the family would not want biopsy/chemo or other invasive/aggressive treatment -She would like to have further conversation with her family prior to making a final decision  3)Thyroid Disorder--- continue methimazole and Coreg  4)HFpEF--- no acute exacerbation at this time, continue Coreg  5)DM2-recent A1c 5.8 reflecting excellent diabetic control PTA Use Novolog/Humalog Sliding scale insulin with Accu-Cheks/Fingersticks as ordered   6)HTN-stable, continue amlodipine 2.5 mg daily along with Coreg 3.125 mg twice daily  7)chronic hypoxic respiratory failure secondary to underlying COPD and CHF--- continue supplemental oxygen continue to encourage patient to keep O2 on  8)Social/Ethics--HCPOA is [name], but patient stays with her son Mr. Roena Malady  Disposition/Need for in-Hospital Stay- patient unable to be discharged at this time due to  acute metabolic encephalopathy -anticipate discharge home with family on 10/25/2020  Status is: Inpatient  Remains inpatient appropriate because:Please see above   Disposition: The patient is from: Home              Anticipated d/c is to: Home with [facility]              Anticipated d/c date is: 1 day              Patient currently is not medically stable to d/c. Barriers: Not Clinically Stable-  Code Status : -  Code Status: Full Code   Family Communication:  Discussed with daughter-HCPOA Consults  :  PCCM  DVT Prophylaxis  :   - SCDs  enoxaparin (LOVENOX) injection 40 mg Start: 10/22/20 2015    Lab Results  Component Value Date   PLT 157 10/22/2020    Inpatient Medications  Scheduled Meds: . amLODipine  2.5 mg Oral Daily  . atorvastatin  10 mg Oral Daily  . carvedilol  3.125 mg Oral BID  . enoxaparin (LOVENOX) injection  40 mg Subcutaneous Q24H  . insulin aspart  0-5 Units Subcutaneous QHS  . insulin aspart  0-9 Units Subcutaneous TID WC  . methimazole  5 mg Oral Daily  .  mometasone-formoterol  2 puff Inhalation BID  . potassium chloride  40 mEq Oral Once   Continuous Infusions: . sodium chloride Stopped (10/23/20 1730)  . cefTRIAXone (ROCEPHIN)  IV 1 g (10/24/20 1727)   PRN Meds:.sodium chloride, acetaminophen **OR** acetaminophen, ipratropium-albuterol, LORazepam, polyethylene glycol    Anti-infectives (From admission, onward)   Start     Dose/Rate Route Frequency Ordered Stop   10/23/20 1200  cefTRIAXone (ROCEPHIN) 1 g in sodium chloride 0.9 % 100 mL IVPB        1 g 200 mL/hr over 30 Minutes Intravenous Every 24 hours 10/22/20 2245     10/22/20 1530  cefTRIAXone (ROCEPHIN) 1 g in sodium chloride 0.9 % 100 mL IVPB        1 g 200 mL/hr over 30 Minutes Intravenous  Once 10/22/20 1522 10/22/20 1648        Objective:   Vitals:   10/23/20 2114 10/24/20 0557 10/24/20 0654 10/24/20 1410  BP: 120/70  126/81 (!) 157/70  Pulse: 79  82 98  Resp: 18  16   Temp: 98 F (36.7 C)  98 F (36.7 C) 97.8 F (36.6 C)  TempSrc:      SpO2: 94%  91% 100%  Weight:  76.5 kg    Height:        Wt Readings from Last 3 Encounters:  10/24/20 76.5 kg  09/22/20 74.9 kg  09/18/20 76 kg     Intake/Output Summary (Last 24 hours) at 10/24/2020 1733 Last data filed at 10/24/2020 0900 Gross per 24 hour  Intake 775.35 ml  Output --  Net 775.35 ml     Physical Exam  Gen:- Awake Alert,  In no apparent distress  HEENT:- [place].AT, No sclera icterus Neck-Supple Neck,No JVD,.  Lungs-  -diminished in bases, no wheeze CV- S1, S2 normal, regular  Abd-  +ve B.Sounds, Abd Soft, No tenderness,    Extremity/Skin:- No  edema, pedal pulses present  Psych-As per patient's daughter patient remains confused , disoriented and not back to baseline yet Neuro-generalized weakness no new focal deficits, no tremors   Data Review:   Micro Results Recent Results (from the past 240 hour(s))  Urine Culture     Status: None   Collection Time: 10/22/20 12:09 PM   Specimen: Urine,  Clean Catch  Result Value Ref Range Status   Specimen Description   Final    URINE, CLEAN CATCH Performed at [facility], [address], [place], [place] [address]    Special Requests   Final    NONE Performed at [facility], [address] [place], [place] [address]    Culture   Final    NO GROWTH Performed at [facility], [place] [address], [place], [place] [address]    Report Status 10/23/2020 FINAL  Final  Resp Panel by RT-PCR (Flu A&B,  Covid) Nasopharyngeal Swab     Status: None   Collection Time: 10/22/20  5:32 PM   Specimen: Nasopharyngeal Swab; Nasopharyngeal(NP) swabs in vial transport medium  Result Value Ref Range Status   SARS Coronavirus 2 by RT PCR NEGATIVE NEGATIVE Final    Comment: (NOTE) SARS-CoV-2 target nucleic acids are NOT DETECTED.  The SARS-CoV-2 RNA is generally detectable in upper respiratory specimens during the acute phase of infection. The lowest concentration of SARS-CoV-2 viral copies this assay can detect is 138 copies/mL. A negative result does not preclude SARS-Cov-2 infection and should not be used as the sole basis for treatment or other patient management decisions. A negative result may occur with  improper specimen collection/handling, submission of specimen other than nasopharyngeal swab, presence of viral mutation(s) within the areas targeted by this assay, and inadequate number of viral copies(<138 copies/mL). A negative result must be combined with clinical observations, patient history, and epidemiological information. The expected result is Negative.  Fact Sheet for Patients:  EntrepreneurPulse.com.au  Fact Sheet for Healthcare Providers:  IncredibleEmployment.be  This test is no t yet approved or cleared by the [place] FDA and  has been authorized for detection and/or diagnosis of SARS-CoV-2 by FDA under an Emergency Use Authorization (EUA). This EUA will remain  in effect (meaning  this test can be used) for the duration of the COVID-19 declaration under Section 564(b)(1) of the Act, 21 U.S.C.section 360bbb-3(b)(1), unless the authorization is terminated  or revoked sooner.       Influenza A by PCR NEGATIVE NEGATIVE Final   Influenza B by PCR NEGATIVE NEGATIVE Final    Comment: (NOTE) The Xpert Xpress SARS-CoV-2/FLU/RSV plus assay is intended as an aid in the diagnosis of influenza from Nasopharyngeal swab specimens and should not be used as a sole basis for treatment. Nasal washings and aspirates are unacceptable for Xpert Xpress SARS-CoV-2/FLU/RSV testing.  Fact Sheet for Patients: EntrepreneurPulse.com.au  Fact Sheet for Healthcare Providers: IncredibleEmployment.be  This test is not yet approved or cleared by the [place] FDA and has been authorized for detection and/or diagnosis of SARS-CoV-2 by FDA under an Emergency Use Authorization (EUA). This EUA will remain in effect (meaning this test can be used) for the duration of the COVID-19 declaration under Section 564(b)(1) of the Act, 21 U.S.C. section 360bbb-3(b)(1), unless the authorization is terminated or revoked.  Performed at [facility], [address] [place], [place] [address]     Radiology Reports DG Knee 1-2 Views Left  Result Date: 10/22/2020 CLINICAL DATA:  Fall. Lower leg edema and pain to left ankle and left knee. EXAM: LEFT KNEE - 1-2 VIEW COMPARISON:  11/22/2018 FINDINGS: No joint effusion identified. Moderate to marked medial compartment joint space narrowing with exuberant marginal spur formation. Mild joint space narrowing and marginal spur formation is noted involving the patellofemoral and lateral compartment. No fracture. Diffuse soft tissue swelling is noted about the knee. IMPRESSION: 1. No acute bone abnormality. 2. Tricompartmental osteoarthritis. 3. Soft tissue swelling. Electronically Signed   By: Kerby Moors M.D.   On: 10/22/2020  20:49   DG Ankle 2 Views Left  Result Date: 10/22/2020 CLINICAL DATA:  [place], lower leg edema, left ankle pain, osteoporosis EXAM: LEFT ANKLE - 2 VIEW COMPARISON:  None. FINDINGS: Frontal and cross-table lateral views of the left ankle are obtained. The bones are diffusely osteopenic. No acute displaced fractures. Alignment is anatomic. Mild tibiotalar osteoarthritis. Prominent inferior calcaneal spur. Diffuse soft tissue edema. IMPRESSION: 1. No acute displaced fracture.  2. Diffuse osteopenia. 3. Diffuse subcutaneous edema. Electronically Signed   By: Randa Ngo M.D.   On: 10/22/2020 20:50   CT Head Wo Contrast  Result Date: 10/22/2020 CLINICAL DATA:  Head trauma, minor. Poly trauma, critical, head/cervical spine injury suspected. EXAM: CT HEAD WITHOUT CONTRAST CT CERVICAL SPINE WITHOUT CONTRAST TECHNIQUE: Multidetector CT imaging of the head and cervical spine was performed following the standard protocol without intravenous contrast. Multiplanar CT image reconstructions of the cervical spine were also generated. COMPARISON:  Head CT 08/15/2020. FINDINGS: CT HEAD FINDINGS Brain: Mildly motion degraded exam. Stable cerebral and cerebellar atrophy. Moderate ill-defined hypoattenuation within the cerebral white matter is nonspecific, but compatible with chronic small vessel ischemic disease. There is no acute intracranial hemorrhage. No demarcated cortical infarct. No extra-axial fluid collection. No evidence of intracranial mass. No midline shift. Vascular: No hyperdense vessel.  Atherosclerotic calcifications. Skull: Normal. Negative for fracture or focal lesion. Sinuses/Orbits: Visualized orbits show no acute finding. No significant paranasal sinus disease at the imaged levels. CT CERVICAL SPINE FINDINGS Alignment: Straightening of the expected cervical lordosis. No significant spondylolisthesis. Skull base and vertebrae: The basion-dental and atlanto-dental intervals are maintained.No evidence of acute  fracture to the cervical spine. Soft tissues and spinal canal: No prevertebral fluid or swelling. No visible canal hematoma. Disc levels: Cervical spondylosis with multilevel disc space narrowing, disc bulges, posterior disc osteophytes, uncovertebral hypertrophy and facet arthrosis. Disc space narrowing is severe at C3-C4, C4-C5 and moderate/severe at C5-C6. Disc bulges and posterior disc osteophytes contribute to suspected severe spinal canal stenosis at C3-C4, C4-C5 and C5-C6. Multilevel bony neural foraminal narrowing. Upper chest: Emphysema. No consolidation within the imaged lung apices. No visible pneumothorax. Other: Motion artifact limits evaluation of the thyroid gland. Thyromegaly with right thyroid lobe calcifications. IMPRESSION: CT head: 1. Mildly motion degraded exam. 2. No evidence of acute intracranial abnormality. 3. Stable generalized atrophy of the brain and chronic small vessel ischemic disease. CT cervical spine: 1. Mildly motion degraded exam. 2. No evidence of acute fracture to the cervical spine. 3. Nonspecific straightening of the expected cervical lordosis. 4. Advanced cervical spondylosis as described. Suspected severe spinal canal stenosis at C3-C4, C4-C5 and C5-C6. 5. Thyromegaly with right thyroid lobe calcifications. Please refer to prior thyroid ultrasound 03/13/2020 for further description of the gland. 6.  Emphysema (ICD10-J43.9). Electronically Signed   By: Kellie Simmering DO   On: 10/22/2020 12:25   CT Chest W Contrast  Result Date: 10/22/2020 CLINICAL DATA:  Mass seen on previous CT EXAM: CT CHEST WITH CONTRAST TECHNIQUE: Multidetector CT imaging of the chest was performed during intravenous contrast administration. CONTRAST:  24mL OMNIPAQUE IOHEXOL 300 MG/ML  SOLN COMPARISON:  Chest CT examinations December 14, 2015 and August 26, 2020; chest radiograph October 22, 2020 FINDINGS: Cardiovascular: The ascending thoracic aortic diameter measures 4.0 x 4.0 cm. No evident thoracic  aortic dissection. There is extensive aortic atherosclerosis throughout multiple visualized great vessels. There is aortic atherosclerosis. There are multiple foci of coronary artery calcification. There is cardiomegaly. No pericardial effusion or pericardial thickening. No major vessel pulmonary embolus evident. Note that the timing bolus was not triggered to optimize pulmonary arterial vascular visualization. There is prominence of the main pulmonary outflow tract measuring 3.6 cm in diameter. Mediastinum/Nodes: The thyroid is again noted to be diffusely enlarged. There is extensive calcification in the enlarged right lobe with a mass occupying much of the right lobe, unchanged from the 2017 study. Thyroid appears stable for essentially a 5 year time span  which is indicative of benign etiology and does not warrant additional imaging surveillance unless clinical symptoms so warrant. No evident thoracic adenopathy.  No esophageal lesions are evident. Lungs/Pleura: There is an irregular mass in the superior segment of the left lower lobe, similar in appearance compared to prior CT examination approximately 2 months prior. This mass measures 3.3 x 2.7 cm. There are areas of decreased attenuation centrally, likely representing a degree of central necrosis within this apparent neoplasm. There is atelectatic change in the lung bases, stable. There is underlying centrilobular and paraseptal emphysematous change. No pleural effusions. No pneumothorax. Trachea and major bronchial structures appear normal. Upper Abdomen: There is upper abdominal aortic atherosclerosis. There is hepatic steatosis. Left adrenal hypertrophy is incompletely visualized, stable in appearance. Musculoskeletal: There are foci of degenerative change in the thoracic spine. No blastic or lytic bone lesions. No chest wall lesions. IMPRESSION: 1. Slightly irregular mass in the superior segment of the left lower lobe with apparent focus of central  necrosis. Appearance consistent with neoplastic lesion. Consultation with pulmonary medicine or thoracic surgery advised. Tissue sampling may well be warranted given the appearance of this lesion. 2. Atelectatic change in the lung bases, more severe on the right than on the left. Underlying emphysematous changes. 3.  No appreciable adenopathy. 4. Ascending thoracic aorta measures 4.0 x 4.0 cm in diameter. Recommend annual imaging followup by CTA or MRA. This recommendation follows 2010 ACCF/AHA/AATS/ACR/ASA/SCA/SCAI/SIR/STS/SVM Guidelines for the Diagnosis and Management of Patients with Thoracic Aortic Disease. Circulation. 2010; 121: V784-O962. Aortic aneurysm NOS (ICD10-I71.9). No dissection evident. Aortic atherosclerosis noted. There are foci of calcification great vessels and coronary arteries. 5. Stable enlarged thyroid with masses, primarily in the right lobe. Stability has been documented for essentially 5 year time span. Additional imaging surveillance not warranted per consensus guidelines. 6.  Hepatic steatosis. Aortic Atherosclerosis (ICD10-I70.0) and Emphysema (ICD10-J43.9). Electronically Signed   By: Lowella Grip III M.D.   On: 10/22/2020 14:31   CT Cervical Spine Wo Contrast  Result Date: 10/22/2020 CLINICAL DATA:  Head trauma, minor. Poly trauma, critical, head/cervical spine injury suspected. EXAM: CT HEAD WITHOUT CONTRAST CT CERVICAL SPINE WITHOUT CONTRAST TECHNIQUE: Multidetector CT imaging of the head and cervical spine was performed following the standard protocol without intravenous contrast. Multiplanar CT image reconstructions of the cervical spine were also generated. COMPARISON:  Head CT 08/15/2020. FINDINGS: CT HEAD FINDINGS Brain: Mildly motion degraded exam. Stable cerebral and cerebellar atrophy. Moderate ill-defined hypoattenuation within the cerebral white matter is nonspecific, but compatible with chronic small vessel ischemic disease. There is no acute intracranial  hemorrhage. No demarcated cortical infarct. No extra-axial fluid collection. No evidence of intracranial mass. No midline shift. Vascular: No hyperdense vessel.  Atherosclerotic calcifications. Skull: Normal. Negative for fracture or focal lesion. Sinuses/Orbits: Visualized orbits show no acute finding. No significant paranasal sinus disease at the imaged levels. CT CERVICAL SPINE FINDINGS Alignment: Straightening of the expected cervical lordosis. No significant spondylolisthesis. Skull base and vertebrae: The basion-dental and atlanto-dental intervals are maintained.No evidence of acute fracture to the cervical spine. Soft tissues and spinal canal: No prevertebral fluid or swelling. No visible canal hematoma. Disc levels: Cervical spondylosis with multilevel disc space narrowing, disc bulges, posterior disc osteophytes, uncovertebral hypertrophy and facet arthrosis. Disc space narrowing is severe at C3-C4, C4-C5 and moderate/severe at C5-C6. Disc bulges and posterior disc osteophytes contribute to suspected severe spinal canal stenosis at C3-C4, C4-C5 and C5-C6. Multilevel bony neural foraminal narrowing. Upper chest: Emphysema. No consolidation within the imaged lung  apices. No visible pneumothorax. Other: Motion artifact limits evaluation of the thyroid gland. Thyromegaly with right thyroid lobe calcifications. IMPRESSION: CT head: 1. Mildly motion degraded exam. 2. No evidence of acute intracranial abnormality. 3. Stable generalized atrophy of the brain and chronic small vessel ischemic disease. CT cervical spine: 1. Mildly motion degraded exam. 2. No evidence of acute fracture to the cervical spine. 3. Nonspecific straightening of the expected cervical lordosis. 4. Advanced cervical spondylosis as described. Suspected severe spinal canal stenosis at C3-C4, C4-C5 and C5-C6. 5. Thyromegaly with right thyroid lobe calcifications. Please refer to prior thyroid ultrasound 03/13/2020 for further description of the  gland. 6.  Emphysema (ICD10-J43.9). Electronically Signed   By: Kellie Simmering DO   On: 10/22/2020 12:25   CT Hip Left Wo Contrast  Result Date: 10/22/2020 CLINICAL DATA:  Left hip pain since a fall 10/16/2020. Initial encounter. EXAM: CT OF THE LEFT HIP WITHOUT CONTRAST TECHNIQUE: Multidetector CT imaging of the left hip was performed according to the standard protocol. Multiplanar CT image reconstructions were also generated. COMPARISON:  Plain films left hip today. FINDINGS: Bones/Joint/Cartilage There is no fracture, dislocation or other acute bony or joint abnormality. Mild degenerative change at the symphysis pubis, left hip and left SI joint noted. No avascular necrosis of the left femoral head. Joint space is preserved. Mild chondrocalcinosis noted. Ligaments Suboptimally assessed by CT. Muscles and Tendons Intact. Soft tissues No acute or focal abnormality. Infiltration of subcutaneous fat lateral to the left hip could be due to contusion. IMPRESSION: No acute bony or joint abnormality. Infiltration of subcutaneous fat lateral to the left hip may be due to soft tissue contusion. Electronically Signed   By: Inge Rise M.D.   On: 10/22/2020 13:58   DG Chest Port 1 View  Result Date: 10/22/2020 CLINICAL DATA:  Pain following fall EXAM: PORTABLE CHEST 1 VIEW COMPARISON:  Chest radiograph and chest CT August 26, 2020 FINDINGS: Underlying emphysematous changes again noted. There is mild right base atelectasis. The left lower lobe mass seen on most recent CT is not appreciable by radiography. There is cardiomegaly with pulmonary vascularity within normal limits. No evident adenopathy. There is aortic atherosclerosis. Bones are osteoporotic. IMPRESSION: 1. The mass in the left lower lobe seen on prior CT is not appreciable on current radiographic examination. Given concern for potential neoplasm based on the appearance of this mass on prior CT, follow-up chest CT, ideally with intravenous contrast, at  this time is advised to further evaluate. 2. No edema or airspace opacity is evident. There is underlying emphysematous change. There is mild right base atelectasis. 3.  Stable cardiomegaly. 4.  Aortic Atherosclerosis (ICD10-I70.0). Electronically Signed   By: Lowella Grip III M.D.   On: 10/22/2020 11:15   DG HIPS BILAT WITH PELVIS MIN 5 VIEWS  Result Date: 10/22/2020 CLINICAL DATA:  Fall, left hip pain EXAM: DG HIP (WITH OR WITHOUT PELVIS) 5+V BILAT COMPARISON:  09/04/2020, 08/15/2020 FINDINGS: Osseous structures are diffusely demineralized. Possible cortical irregularity in the subcapital region of the proximal left femur, not well characterized given lack of frog-lateral view. Cross-table lateral view is limited by overlapping structures. Right hip joint appears intact without fracture or dislocation. Sacrum is largely obscured by overlying stool and bowel gas. No evidence of pelvic diastasis. IMPRESSION: Possible cortical irregularity in the subcapital region of the proximal left femur, not well characterized. A CT scan of the left hip is recommended for further evaluation. Electronically Signed   By: Davina Poke D.O.  On: 10/22/2020 12:03     CBC Recent Labs  Lab 10/22/20 1032  WBC 5.5  HGB 11.1*  HCT 37.4  PLT 157  MCV 87.8  MCH 26.1  MCHC 29.7*  RDW 16.2*  LYMPHSABS 0.7  MONOABS 0.5  EOSABS 0.0  BASOSABS 0.0    Chemistries  Recent Labs  Lab 10/22/20 1032 10/23/20 0423  NA 144 145  K 3.3* 2.7*  CL 102 99  CO2 28 32  GLUCOSE 81 79  BUN 15 13  CREATININE 0.76 0.73  CALCIUM 9.0 8.8*  MG  --  1.6*  AST 24  --   ALT 14  --   ALKPHOS 48  --   BILITOT 1.2  --    ------------------------------------------------------------------------------------------------------------------ No results for input(s): CHOL, HDL, LDLCALC, TRIG, CHOLHDL, LDLDIRECT in the last 72 hours.  Lab Results  Component Value Date   HGBA1C 5.8 (H) 08/29/2020    ------------------------------------------------------------------------------------------------------------------ No results for input(s): TSH, T4TOTAL, T3FREE, THYROIDAB in the last 72 hours.  Invalid input(s): FREET3 ------------------------------------------------------------------------------------------------------------------ No results for input(s): VITAMINB12, FOLATE, FERRITIN, TIBC, IRON, RETICCTPCT in the last 72 hours.  Coagulation profile No results for input(s): INR, PROTIME in the last 168 hours.  No results for input(s): DDIMER in the last 72 hours.  Cardiac Enzymes No results for input(s): CKMB, TROPONINI, MYOGLOBIN in the last 168 hours.  Invalid input(s): CK ------------------------------------------------------------------------------------------------------------------    Component Value Date/Time   BNP 207.0 (H) 10/22/2020 1033   BNP 112.8 (H) 03/22/2013 1707   Roxan Hockey M.D on 10/24/2020 at 5:33 PM  Go to [email or web address] - for contact info  [facility] - Office  [phone]

## 2020-10-24 NOTE — Progress Notes (Signed)
Patient refused medication and states " That's your medicine" . Attempt x 2 continues to state that's not her medication .,  Sitting on side of bed stating she has to go to the doctors office. Attempt to reorient patient , she does not recall fall at home

## 2020-10-24 NOTE — Plan of Care (Signed)

## 2020-10-24 NOTE — Progress Notes (Signed)
Patient attempting to hit staff, and to get OOB . Given PRN Ativan d/t patient anxious and paranoid. Redirection not helpful. Removed purewick tubing and threw it in the floor. Patient cussing at staff. Notified Dr. Roxan Hockey.

## 2020-10-24 NOTE — Progress Notes (Signed)
PT Cancellation Note  Patient Details Name: Sonya Oliver MRN: [id number] DOB: 03/31/1941   Cancelled Treatment:    Reason Eval/Treat Not Completed: Fatigue/lethargy limiting ability to participate. Patient had been combative with nursing staff and given Ativan this am. Patient asleep in bed and PT unable to arouse. PT will continue to follow.   Floria Raveling. Hartnett-Rands, MS, PT [id number] 10/24/2020, 12:51 PM

## 2020-10-25 LAB — BASIC METABOLIC PANEL
Anion gap: 14 (ref 5–15)
BUN: 16 mg/dL (ref 8–23)
CO2: 30 mmol/L (ref 22–32)
Calcium: 9.4 mg/dL (ref 8.9–10.3)
Chloride: 98 mmol/L (ref 98–111)
Creatinine, Ser: 0.6 mg/dL (ref 0.44–1.00)
GFR, Estimated: >60 mL/min (ref >60)
Glucose, Bld: 122 mg/dL — ABNORMAL HIGH (ref 70–99)
Potassium: 4.2 mmol/L (ref 3.5–5.1)
Sodium: 142 mmol/L (ref 135–145)

## 2020-10-25 LAB — GLUCOSE, CAPILLARY
Glucose-Capillary: 110 mg/dL — ABNORMAL HIGH (ref 70–99)
Glucose-Capillary: 124 mg/dL — ABNORMAL HIGH (ref 70–99)
Glucose-Capillary: 164 mg/dL — ABNORMAL HIGH (ref 70–99)
Glucose-Capillary: 94 mg/dL (ref 70–99)

## 2020-10-25 LAB — CBC
HCT: 43.8 % (ref 36.0–46.0)
Hemoglobin: 12.7 g/dL (ref 12.0–15.0)
MCH: 25.9 pg — ABNORMAL LOW (ref 26.0–34.0)
MCHC: 29 g/dL — ABNORMAL LOW (ref 30.0–36.0)
MCV: 89.4 fL (ref 80.0–100.0)
Platelets: 177 10*3/uL (ref 150–400)
RBC: 4.9 MIL/uL (ref 3.87–5.11)
RDW: 16 % — ABNORMAL HIGH (ref 11.5–15.5)
WBC: 4.8 10*3/uL (ref 4.0–10.5)
nRBC: 0 % (ref 0.0–0.2)

## 2020-10-25 LAB — MAGNESIUM: Magnesium: 2.3 mg/dL (ref 1.7–2.4)

## 2020-10-25 MED ORDER — KCL IN DEXTROSE-NACL 40-5-0.45 MEQ/L-%-% IV SOLN: INTRAVENOUS | Status: DC

## 2020-10-25 NOTE — Progress Notes (Signed)
   10/25/20 1526  Vitals  Temp 98.6 F (37 C)  Temp Source Oral  BP (!) 122/100  MAP (mmHg) 108  BP Location Left Arm  BP Method Automatic  Patient Position (if appropriate) Lying  Pulse Rate 81  Pulse Rate Source Dinamap  Resp (!) 23  MEWS COLOR  MEWS Score Color Yellow  Oxygen Therapy  SpO2 92 %  O2 Device Nasal Cannula  O2 Flow Rate (L/min) 2 L/min  MEWS Score  MEWS Temp 0  MEWS Systolic 0  MEWS Pulse 0  MEWS RR 1  MEWS LOC 1  MEWS Score 2  Provider Notification  Provider Name/Title Dr. Joesph Fillers  Date Provider Notified 10/25/20  Time Provider Notified 8329  Notification Type Page  Notification Reason Other (Comment) (Vital Signs)  Provider response No new orders  Date of Provider Response 10/25/20  Time of Provider Response 1534

## 2020-10-25 NOTE — Progress Notes (Signed)
Patient Demographics:    Sonya Oliver, is a 80 y.o. female, DOB - Dec 17, 1940, KGU:[id number]  Admit date - 10/22/2020   Admitting Physician Roxan Hockey, MD  Outpatient Primary MD for the patient is Loman Brooklyn, FNP  LOS - 2   Chief Complaint  Patient presents with  . Weakness        Subjective:    Sonya Oliver today has no fevers, no emesis,  No chest pain,    -Patient's son Sonya Oliver and daughter-in-law Sonya Oliver at bedside - Sadly patient continues to decline, very poor oral intake, -Episodes of lethargy alternating with episodes of severe agitation and confusion  Assessment  & Plan :    Principal Problem:   Fall at home, initial encounter Active Problems:   Edema leg   COPD (chronic obstructive pulmonary disease) ([place])   Essential hypertension   Diabetes mellitus, type 2 ([place])   Chronic diastolic CHF (congestive heart failure) ([facility])   Mass of lower lobe of left lung   Chronic respiratory failure with hypoxia ([place])   Acute lower UTI   Acute metabolic encephalopathy  Brief Summary:- 80 y.o. female with medical history significant for COPD, previously known left lung mass diabetes mellitus, diastolic CHF, and prior Covid pneumonia infection admitted on 10/22/2020 after a fall with worsening confusion and behavior problems-Patient's daughter Ms. Sonya Oliver states that this level of confusion or psychosis is different from patient's baseline   A/p 1) acute metabolic encephalopathy superimposed on underlying dementia--- -Patient's daughter Ms. 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 Melvyn Novas pulmonologist, recommends outpatient PET scan and further work-up after discussing goals of care with family members -HCPOA believes that the family would not want biopsy/chemo or other invasive/aggressive treatment -At this time family does not desire to pursue aggressive work-up  3)Thyroid Disorder--- continue methimazole and Coreg  4)HFpEF--- no acute exacerbation at this time, continue Coreg  5)DM2-recent A1c 5.8 reflecting excellent diabetic control PTA -Patient had hypoglycemia due to poor oral intake, required IV dextrose infusion to avoid worsening hypoglycemia -Oral intake remains pretty poor patient remains at risk for hypoglycemia Use Novolog/Humalog Sliding scale insulin with Accu-Cheks/Fingersticks as ordered   6)HTN-stable, continue amlodipine 2.5 mg daily along with Coreg 3.125 mg twice daily  7)chronic hypoxic respiratory failure secondary to underlying COPD and CHF--- continue supplemental oxygen continue to encourage patient to keep O2 on  8)Social/Ethics--HCPOA is Sonya Oliver, but patient stays with her son Mr. 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 --Discussed goals of care and possible palliative/hospice involvement with patient's daughter Sonya Oliver and  patient's son Sonya Oliver and daughter-in-law Sonya Oliver --- They request DNR status -They would like to have further conversations amongst themselves before making decisions on palliative care/hospice involvement   Disposition/Need for  in-Hospital Stay- patient unable to be discharged at this time due to  acute metabolic encephalopathy -very poor oral intake increasing lethargy alternating with episodes of confusion--awaiting family decision on possible transition to comfort care/hospice   Status is: Inpatient  Remains inpatient appropriate because:Please see above   Disposition: The patient is from: Home              Anticipated d/c is to: Awaiting family decision on possible transition to comfort care              Anticipated d/c date is: 1 day              Patient currently is not medically stable to d/c. Barriers: Not Clinically Stable-  Code Status : -  Code Status: DNR   Family Communication:   Discussed with daughter-HCPOA, son Sonya Oliver and daughter-in-law Sonya Oliver  Consults  :  PCCM  DVT Prophylaxis  :   - SCDs  enoxaparin (LOVENOX) injection 40 mg Start: 10/22/20 2015  Lab Results  Component Value Date   PLT 177 10/25/2020    Inpatient Medications  Scheduled Meds: . amLODipine  2.5 mg Oral Daily  . atorvastatin  10 mg Oral Daily  . carvedilol  3.125 mg Oral BID  . enoxaparin (LOVENOX) injection  40 mg Subcutaneous Q24H  . insulin aspart  0-6 Units Subcutaneous TID WC  . methimazole  5 mg Oral Daily  . mometasone-formoterol  2 puff Inhalation BID   Continuous Infusions: . sodium chloride Stopped (10/23/20 1730)   PRN Meds:.sodium chloride, acetaminophen **OR** acetaminophen, ipratropium-albuterol, LORazepam, polyethylene glycol    Anti-infectives (From admission, onward)   Start     Dose/Rate Route Frequency Ordered Stop   10/23/20 1200  cefTRIAXone (ROCEPHIN) 1 g in sodium chloride 0.9 % 100 mL IVPB        1 g 200 mL/hr over 30 Minutes Intravenous Every 24 hours 10/22/20 2245 10/24/20 2359   10/22/20 1530  cefTRIAXone (ROCEPHIN) 1 g in sodium chloride 0.9 % 100 mL IVPB        1 g 200 mL/hr over 30 Minutes Intravenous  Once 10/22/20 1522 10/22/20 1648        Objective:   Vitals:    10/25/20 0822 10/25/20 0943 10/25/20 1526 10/25/20 1530  BP:  (!) 145/95 (!) 122/100 (!) 142/95  Pulse:  92 81   Resp:   (!) 23   Temp:   98.6 F (37 C)   TempSrc:   Oral   SpO2: 93% 92% 92%   Weight:      Height:        Wt Readings from Last 3 Encounters:  10/24/20 76.5 kg  09/22/20 74.9 kg  09/18/20 76 kg     Intake/Output Summary (Last 24 hours) at 10/25/2020 1627 Last data filed at 10/24/2020 1823 Gross per 24 hour  Intake 0 ml  Output --  Net 0 ml     Physical Exam  Gen:-Episodes of confusion and agitation alternating with episodes of lethargy HEENT:- [place].AT, No sclera icterus Nose- [place] 2L/min Neck-Supple Neck,No JVD,.  Lungs-  -diminished in bases, no wheeze CV- S1, S2 normal, regular  Abd-  +ve B.Sounds, Abd Soft, No tenderness,    Extremity/Skin:- No  edema, pedal pulses present  Psych-As per patient's daughter and son  patient remains confused , disoriented and not back to baseline yet Neuro-generalized weakness no new focal deficits, no tremors   Data Review:   Micro Results Recent Results (from the past 240 hour(s))  Urine Culture     Status: None   Collection Time: 10/22/20 12:09 PM   Specimen: Urine, Clean Catch  Result Value Ref Range Status   Specimen Description   Final    URINE, CLEAN CATCH Performed at [facility], [address], [place], [place] [address]    Special Requests   Final    NONE Performed at [facility], [address], [place], [place] [address]    Culture   Final    NO GROWTH Performed at [facility], [place] [address], [place], [place] [address]    Report Status 10/23/2020 FINAL  Final  Resp Panel by RT-PCR (Flu A&B, Covid) Nasopharyngeal Swab     Status: None   Collection Time: 10/22/20  5:32 PM   Specimen: Nasopharyngeal Swab; Nasopharyngeal(NP) swabs in vial transport medium  Result Value Ref Range Status   SARS Coronavirus 2 by RT PCR NEGATIVE NEGATIVE Final    Comment: (NOTE) SARS-CoV-2 target nucleic acids  are NOT DETECTED.  The SARS-CoV-2 RNA is generally detectable in upper respiratory specimens during the acute phase of infection. The lowest concentration of SARS-CoV-2 viral copies this assay can detect is 138 copies/mL. A negative result does not preclude SARS-Cov-2 infection and should not be used as the sole basis for treatment or other patient management decisions. A negative result may occur with  improper specimen collection/handling, submission of specimen other than nasopharyngeal swab, presence of viral mutation(s) within the areas targeted by this assay, and inadequate number of viral copies(<138 copies/mL). A negative result must be combined with clinical observations, patient history, and epidemiological information. The expected result is Negative.  Fact Sheet for Patients:  EntrepreneurPulse.com.au  Fact Sheet for Healthcare Providers:  IncredibleEmployment.be  This test is no t yet approved or cleared by the [place] FDA and  has been authorized for detection and/or diagnosis of SARS-CoV-2 by FDA under an Emergency Use Authorization (EUA). This EUA will remain  in effect (meaning this test can be used) for the duration of the COVID-19 declaration under Section 564(b)(1) of the Act, 21 U.S.C.section 360bbb-3(b)(1), unless the authorization is terminated  or revoked sooner.       Influenza A by PCR NEGATIVE NEGATIVE Final   Influenza B by PCR NEGATIVE NEGATIVE Final    Comment: (NOTE) The Xpert Xpress SARS-CoV-2/FLU/RSV plus assay is intended as an aid in the diagnosis of influenza from Nasopharyngeal swab specimens and should not be used as a sole basis for treatment. Nasal washings and aspirates are unacceptable for Xpert Xpress SARS-CoV-2/FLU/RSV testing.  Fact Sheet for Patients: EntrepreneurPulse.com.au  Fact Sheet for Healthcare Providers: IncredibleEmployment.be  This test is  not yet approved or cleared by the [place] FDA and has been authorized for detection and/or diagnosis of SARS-CoV-2 by FDA under an Emergency Use Authorization (EUA). This EUA will remain in effect (meaning this test can be used) for the duration of the COVID-19 declaration under Section 564(b)(1) of the Act, 21 U.S.C. section 360bbb-3(b)(1), unless the authorization is terminated or revoked.  Performed at [facility], [address], [place], [place] [address]     Radiology Reports DG Knee 1-2 Views Left  Result Date: 10/22/2020 CLINICAL DATA:  Fall. Lower leg edema and pain to left ankle and left knee. EXAM: LEFT KNEE -  1-2 VIEW COMPARISON:  11/22/2018 FINDINGS: No joint effusion identified. Moderate to marked medial compartment joint space narrowing with exuberant marginal spur formation. Mild joint space narrowing and marginal spur formation is noted involving the patellofemoral and lateral compartment. No fracture. Diffuse soft tissue swelling is noted about the knee. IMPRESSION: 1. No acute bone abnormality. 2. Tricompartmental osteoarthritis. 3. Soft tissue swelling. Electronically Signed   By: Kerby Moors M.D.   On: 10/22/2020 20:49   DG Ankle 2 Views Left  Result Date: 10/22/2020 CLINICAL DATA:  [place], lower leg edema, left ankle pain, osteoporosis EXAM: LEFT ANKLE - 2 VIEW COMPARISON:  None. FINDINGS: Frontal and cross-table lateral views of the left ankle are obtained. The bones are diffusely osteopenic. No acute displaced fractures. Alignment is anatomic. Mild tibiotalar osteoarthritis. Prominent inferior calcaneal spur. Diffuse soft tissue edema. IMPRESSION: 1. No acute displaced fracture. 2. Diffuse osteopenia. 3. Diffuse subcutaneous edema. Electronically Signed   By: Randa Ngo M.D.   On: 10/22/2020 20:50   CT Head Wo Contrast  Result Date: 10/22/2020 CLINICAL DATA:  Head trauma, minor. Poly trauma, critical, head/cervical spine injury suspected. EXAM: CT HEAD  WITHOUT CONTRAST CT CERVICAL SPINE WITHOUT CONTRAST TECHNIQUE: Multidetector CT imaging of the head and cervical spine was performed following the standard protocol without intravenous contrast. Multiplanar CT image reconstructions of the cervical spine were also generated. COMPARISON:  Head CT 08/15/2020. FINDINGS: CT HEAD FINDINGS Brain: Mildly motion degraded exam. Stable cerebral and cerebellar atrophy. Moderate ill-defined hypoattenuation within the cerebral white matter is nonspecific, but compatible with chronic small vessel ischemic disease. There is no acute intracranial hemorrhage. No demarcated cortical infarct. No extra-axial fluid collection. No evidence of intracranial mass. No midline shift. Vascular: No hyperdense vessel.  Atherosclerotic calcifications. Skull: Normal. Negative for fracture or focal lesion. Sinuses/Orbits: Visualized orbits show no acute finding. No significant paranasal sinus disease at the imaged levels. CT CERVICAL SPINE FINDINGS Alignment: Straightening of the expected cervical lordosis. No significant spondylolisthesis. Skull base and vertebrae: The basion-dental and atlanto-dental intervals are maintained.No evidence of acute fracture to the cervical spine. Soft tissues and spinal canal: No prevertebral fluid or swelling. No visible canal hematoma. Disc levels: Cervical spondylosis with multilevel disc space narrowing, disc bulges, posterior disc osteophytes, uncovertebral hypertrophy and facet arthrosis. Disc space narrowing is severe at C3-C4, C4-C5 and moderate/severe at C5-C6. Disc bulges and posterior disc osteophytes contribute to suspected severe spinal canal stenosis at C3-C4, C4-C5 and C5-C6. Multilevel bony neural foraminal narrowing. Upper chest: Emphysema. No consolidation within the imaged lung apices. No visible pneumothorax. Other: Motion artifact limits evaluation of the thyroid gland. Thyromegaly with right thyroid lobe calcifications. IMPRESSION: CT head: 1.  Mildly motion degraded exam. 2. No evidence of acute intracranial abnormality. 3. Stable generalized atrophy of the brain and chronic small vessel ischemic disease. CT cervical spine: 1. Mildly motion degraded exam. 2. No evidence of acute fracture to the cervical spine. 3. Nonspecific straightening of the expected cervical lordosis. 4. Advanced cervical spondylosis as described. Suspected severe spinal canal stenosis at C3-C4, C4-C5 and C5-C6. 5. Thyromegaly with right thyroid lobe calcifications. Please refer to prior thyroid ultrasound 03/13/2020 for further description of the gland. 6.  Emphysema (ICD10-J43.9). Electronically Signed   By: Kellie Simmering DO   On: 10/22/2020 12:25   CT Chest W Contrast  Result Date: 10/22/2020 CLINICAL DATA:  Mass seen on previous CT EXAM: CT CHEST WITH CONTRAST TECHNIQUE: Multidetector CT imaging of the chest was performed during intravenous contrast administration. CONTRAST:  19mL OMNIPAQUE IOHEXOL 300 MG/ML  SOLN COMPARISON:  Chest CT examinations December 14, 2015 and August 26, 2020; chest radiograph October 22, 2020 FINDINGS: Cardiovascular: The ascending thoracic aortic diameter measures 4.0 x 4.0 cm. No evident thoracic aortic dissection. There is extensive aortic atherosclerosis throughout multiple visualized great vessels. There is aortic atherosclerosis. There are multiple foci of coronary artery calcification. There is cardiomegaly. No pericardial effusion or pericardial thickening. No major vessel pulmonary embolus evident. Note that the timing bolus was not triggered to optimize pulmonary arterial vascular visualization. There is prominence of the main pulmonary outflow tract measuring 3.6 cm in diameter. Mediastinum/Nodes: The thyroid is again noted to be diffusely enlarged. There is extensive calcification in the enlarged right lobe with a mass occupying much of the right lobe, unchanged from the 2017 study. Thyroid appears stable for essentially a 5 year time  span which is indicative of benign etiology and does not warrant additional imaging surveillance unless clinical symptoms so warrant. No evident thoracic adenopathy.  No esophageal lesions are evident. Lungs/Pleura: There is an irregular mass in the superior segment of the left lower lobe, similar in appearance compared to prior CT examination approximately 2 months prior. This mass measures 3.3 x 2.7 cm. There are areas of decreased attenuation centrally, likely representing a degree of central necrosis within this apparent neoplasm. There is atelectatic change in the lung bases, stable. There is underlying centrilobular and paraseptal emphysematous change. No pleural effusions. No pneumothorax. Trachea and major bronchial structures appear normal. Upper Abdomen: There is upper abdominal aortic atherosclerosis. There is hepatic steatosis. Left adrenal hypertrophy is incompletely visualized, stable in appearance. Musculoskeletal: There are foci of degenerative change in the thoracic spine. No blastic or lytic bone lesions. No chest wall lesions. IMPRESSION: 1. Slightly irregular mass in the superior segment of the left lower lobe with apparent focus of central necrosis. Appearance consistent with neoplastic lesion. Consultation with pulmonary medicine or thoracic surgery advised. Tissue sampling may well be warranted given the appearance of this lesion. 2. Atelectatic change in the lung bases, more severe on the right than on the left. Underlying emphysematous changes. 3.  No appreciable adenopathy. 4. Ascending thoracic aorta measures 4.0 x 4.0 cm in diameter. Recommend annual imaging followup by CTA or MRA. This recommendation follows 2010 ACCF/AHA/AATS/ACR/ASA/SCA/SCAI/SIR/STS/SVM Guidelines for the Diagnosis and Management of Patients with Thoracic Aortic Disease. Circulation. 2010; 121: U932-T557. Aortic aneurysm NOS (ICD10-I71.9). No dissection evident. Aortic atherosclerosis noted. There are foci of  calcification great vessels and coronary arteries. 5. Stable enlarged thyroid with masses, primarily in the right lobe. Stability has been documented for essentially 5 year time span. Additional imaging surveillance not warranted per consensus guidelines. 6.  Hepatic steatosis. Aortic Atherosclerosis (ICD10-I70.0) and Emphysema (ICD10-J43.9). Electronically Signed   By: Lowella Grip III M.D.   On: 10/22/2020 14:31   CT Cervical Spine Wo Contrast  Result Date: 10/22/2020 CLINICAL DATA:  Head trauma, minor. Poly trauma, critical, head/cervical spine injury suspected. EXAM: CT HEAD WITHOUT CONTRAST CT CERVICAL SPINE WITHOUT CONTRAST TECHNIQUE: Multidetector CT imaging of the head and cervical spine was performed following the standard protocol without intravenous contrast. Multiplanar CT image reconstructions of the cervical spine were also generated. COMPARISON:  Head CT 08/15/2020. FINDINGS: CT HEAD FINDINGS Brain: Mildly motion degraded exam. Stable cerebral and cerebellar atrophy. Moderate ill-defined hypoattenuation within the cerebral white matter is nonspecific, but compatible with chronic small vessel ischemic disease. There is no acute intracranial hemorrhage. No demarcated cortical infarct. No extra-axial fluid collection.  No evidence of intracranial mass. No midline shift. Vascular: No hyperdense vessel.  Atherosclerotic calcifications. Skull: Normal. Negative for fracture or focal lesion. Sinuses/Orbits: Visualized orbits show no acute finding. No significant paranasal sinus disease at the imaged levels. CT CERVICAL SPINE FINDINGS Alignment: Straightening of the expected cervical lordosis. No significant spondylolisthesis. Skull base and vertebrae: The basion-dental and atlanto-dental intervals are maintained.No evidence of acute fracture to the cervical spine. Soft tissues and spinal canal: No prevertebral fluid or swelling. No visible canal hematoma. Disc levels: Cervical spondylosis with  multilevel disc space narrowing, disc bulges, posterior disc osteophytes, uncovertebral hypertrophy and facet arthrosis. Disc space narrowing is severe at C3-C4, C4-C5 and moderate/severe at C5-C6. Disc bulges and posterior disc osteophytes contribute to suspected severe spinal canal stenosis at C3-C4, C4-C5 and C5-C6. Multilevel bony neural foraminal narrowing. Upper chest: Emphysema. No consolidation within the imaged lung apices. No visible pneumothorax. Other: Motion artifact limits evaluation of the thyroid gland. Thyromegaly with right thyroid lobe calcifications. IMPRESSION: CT head: 1. Mildly motion degraded exam. 2. No evidence of acute intracranial abnormality. 3. Stable generalized atrophy of the brain and chronic small vessel ischemic disease. CT cervical spine: 1. Mildly motion degraded exam. 2. No evidence of acute fracture to the cervical spine. 3. Nonspecific straightening of the expected cervical lordosis. 4. Advanced cervical spondylosis as described. Suspected severe spinal canal stenosis at C3-C4, C4-C5 and C5-C6. 5. Thyromegaly with right thyroid lobe calcifications. Please refer to prior thyroid ultrasound 03/13/2020 for further description of the gland. 6.  Emphysema (ICD10-J43.9). Electronically Signed   By: Kellie Simmering DO   On: 10/22/2020 12:25   CT Hip Left Wo Contrast  Result Date: 10/22/2020 CLINICAL DATA:  Left hip pain since a fall 10/16/2020. Initial encounter. EXAM: CT OF THE LEFT HIP WITHOUT CONTRAST TECHNIQUE: Multidetector CT imaging of the left hip was performed according to the standard protocol. Multiplanar CT image reconstructions were also generated. COMPARISON:  Plain films left hip today. FINDINGS: Bones/Joint/Cartilage There is no fracture, dislocation or other acute bony or joint abnormality. Mild degenerative change at the symphysis pubis, left hip and left SI joint noted. No avascular necrosis of the left femoral head. Joint space is preserved. Mild  chondrocalcinosis noted. Ligaments Suboptimally assessed by CT. Muscles and Tendons Intact. Soft tissues No acute or focal abnormality. Infiltration of subcutaneous fat lateral to the left hip could be due to contusion. IMPRESSION: No acute bony or joint abnormality. Infiltration of subcutaneous fat lateral to the left hip may be due to soft tissue contusion. Electronically Signed   By: Inge Rise M.D.   On: 10/22/2020 13:58   DG Chest Port 1 View  Result Date: 10/22/2020 CLINICAL DATA:  Pain following fall EXAM: PORTABLE CHEST 1 VIEW COMPARISON:  Chest radiograph and chest CT August 26, 2020 FINDINGS: Underlying emphysematous changes again noted. There is mild right base atelectasis. The left lower lobe mass seen on most recent CT is not appreciable by radiography. There is cardiomegaly with pulmonary vascularity within normal limits. No evident adenopathy. There is aortic atherosclerosis. Bones are osteoporotic. IMPRESSION: 1. The mass in the left lower lobe seen on prior CT is not appreciable on current radiographic examination. Given concern for potential neoplasm based on the appearance of this mass on prior CT, follow-up chest CT, ideally with intravenous contrast, at this time is advised to further evaluate. 2. No edema or airspace opacity is evident. There is underlying emphysematous change. There is mild right base atelectasis. 3.  Stable cardiomegaly. 4.  Aortic Atherosclerosis (ICD10-I70.0). Electronically Signed   By: Lowella Grip III M.D.   On: 10/22/2020 11:15   DG HIPS BILAT WITH PELVIS MIN 5 VIEWS  Result Date: 10/22/2020 CLINICAL DATA:  Fall, left hip pain EXAM: DG HIP (WITH OR WITHOUT PELVIS) 5+V BILAT COMPARISON:  09/04/2020, 08/15/2020 FINDINGS: Osseous structures are diffusely demineralized. Possible cortical irregularity in the subcapital region of the proximal left femur, not well characterized given lack of frog-lateral view. Cross-table lateral view is limited by  overlapping structures. Right hip joint appears intact without fracture or dislocation. Sacrum is largely obscured by overlying stool and bowel gas. No evidence of pelvic diastasis. IMPRESSION: Possible cortical irregularity in the subcapital region of the proximal left femur, not well characterized. A CT scan of the left hip is recommended for further evaluation. Electronically Signed   By: Davina Poke D.O.   On: 10/22/2020 12:03     CBC Recent Labs  Lab 10/22/20 1032 10/25/20 0608  WBC 5.5 4.8  HGB 11.1* 12.7  HCT 37.4 43.8  PLT 157 177  MCV 87.8 89.4  MCH 26.1 25.9*  MCHC 29.7* 29.0*  RDW 16.2* 16.0*  LYMPHSABS 0.7  --   MONOABS 0.5  --   EOSABS 0.0  --   BASOSABS 0.0  --     Chemistries  Recent Labs  Lab 10/22/20 1032 10/23/20 0423 10/25/20 0608  NA 144 145 142  K 3.3* 2.7* 4.2  CL 102 99 98  CO2 28 32 30  GLUCOSE 81 79 122*  BUN 15 13 16   CREATININE 0.76 0.73 0.60  CALCIUM 9.0 8.8* 9.4  MG  --  1.6* 2.3  AST 24  --   --   ALT 14  --   --   ALKPHOS 48  --   --   BILITOT 1.2  --   --    ------------------------------------------------------------------------------------------------------------------ No results for input(s): CHOL, HDL, LDLCALC, TRIG, CHOLHDL, LDLDIRECT in the last 72 hours.  Lab Results  Component Value Date   HGBA1C 5.8 (H) 08/29/2020   ------------------------------------------------------------------------------------------------------------------ No results for input(s): TSH, T4TOTAL, T3FREE, THYROIDAB in the last 72 hours.  Invalid input(s): FREET3 ------------------------------------------------------------------------------------------------------------------ No results for input(s): VITAMINB12, FOLATE, FERRITIN, TIBC, IRON, RETICCTPCT in the last 72 hours.  Coagulation profile No results for input(s): INR, PROTIME in the last 168 hours.  No results for input(s): DDIMER in the last 72 hours.  Cardiac Enzymes No results for  input(s): CKMB, TROPONINI, MYOGLOBIN in the last 168 hours.  Invalid input(s): CK ------------------------------------------------------------------------------------------------------------------    Component Value Date/Time   BNP 207.0 (H) 10/22/2020 1033   BNP 112.8 (H) 03/22/2013 1707   Roxan Hockey M.D on 10/25/2020 at 4:27 PM  Go to [email or web address] - for contact info  [facility] - Office  [phone]

## 2020-10-26 LAB — RESP PANEL BY RT-PCR (FLU A&B, COVID) ARPGX2
Influenza A by PCR: NEGATIVE
Influenza B by PCR: NEGATIVE
SARS Coronavirus 2 by RT PCR: POSITIVE — AB

## 2020-10-26 LAB — GLUCOSE, CAPILLARY
Glucose-Capillary: 105 mg/dL — ABNORMAL HIGH (ref 70–99)
Glucose-Capillary: 140 mg/dL — ABNORMAL HIGH (ref 70–99)
Glucose-Capillary: 107 mg/dL — ABNORMAL HIGH (ref 70–99)

## 2020-10-26 MED ORDER — KCL IN DEXTROSE-NACL 40-5-0.45 MEQ/L-%-% IV SOLN: INTRAVENOUS | Status: DC

## 2020-10-26 MED ORDER — INSULIN ASPART 100 UNIT/ML ~~LOC~~ SOLN: 0.0000 [IU] | Freq: Four times a day (QID) | SUBCUTANEOUS | Status: DC

## 2020-10-26 MED ORDER — LABETALOL HCL 5 MG/ML IV SOLN: 10.0000 mg | INTRAVENOUS | Status: DC | PRN

## 2020-10-26 NOTE — Progress Notes (Addendum)
  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, diabetes mellitus, diastolic CHF, and prior Covid pneumonia infection admitted on 10/22/2020 after a fall with worsening confusion and behavior problems- -patient has developed recurrent hypoglycemic episodes due to inability to have oral intake in the setting of progressive dementia with increased confusion and behavioral disturbance --Patient has been requiring IV dextrose to avoid significant hypoglycemia -Given very poor  oral intake with recurrent hypoglycemic episodes expected life expectancy is less than 6 weeks  A/p 1) advanced dementia with significant cognitive and memory deficits and behavioral disturbance--- patient's dementia has progressed to the point where she is no longer able to eat -Given very poor  oral intake with recurrent hypoglycemic episodes expected life expectancy is less than 6 weeks  2)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 Melvyn Novas pulmonologist, recommends outpatient PET scan and further work-up after discussing goals of care with family members -Discussed with patient's daughter [name] [facility], also discussed with patient's son Iona Beard and patient's daughter-in-law Yolonda Kida- --the family Does Not want biopsy/chemo or other invasive/aggressive treatment -At this time family does not desire to pursue aggressive work-up --Family had conversations with hospice Team and have decided to transition to comfort care mode with transfer to [facility]/house--- residential hospice  3)Social/Ethics--HCPOA is [name], but patient stays with her son Mr. 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

## 2020-10-26 NOTE — Progress Notes (Signed)
Daughters visiting at beginning  Of shift. Alert with confusion. Slept well for a while then became very anxious and confused. Pulled of clothe and IV access. incontinent of bowel and bladder. [employer] on staff for all alds. Took medication crushed in applesauce. Bed alarm in use. Lorazepam given for increased anxiety and finally calmed down. Will continue plan of care.

## 2020-10-26 NOTE — Progress Notes (Signed)
   10/26/20 1800  Provider Notification  Provider Name/Title Dr. Denton Brick  Date Provider Notified 10/26/20  Time Provider Notified 1818  Notification Type Face-to-face  Notification Reason Critical result  Test performed and critical result COVID+  Date Critical Result Received 10/26/20  Time Critical Result Received 1818  Provider response No new orders (previos COVID infection)  Date of Provider Response 10/26/20  Time of Provider Response 1819

## 2020-10-26 NOTE — TOC Progression Note (Addendum)
Transition of Care [facility]) - Progression Note    Patient Details  Name: ELLIE BRYAND MRN: [id number] Date of Birth: 10-25-1940  Transition of Care [facility]) CM/SW [place], LCSW Phone Number: 10/26/2020, 2:14 PM  Clinical Narrative:    Per MD patient's family requested to speak with hospice. CSW contacted patient's family to inquire if they preferred home hospice or [place] residential hospice. Family requested Residential hospice. CSW placed call with [facility] hospice after hours. [facility] after hours agreeable to have hospice RN contact family with the number provided by family. [phone]. TOC to follow.  Addendum  Patient's family agreeable to Residential Hospice. CSW contacted [employer]. [place] requested H&P, prognosis, 6 weeks or less life expectancy, Covid test and Palliative note. CSW notified MD of the need for Covid test, palliative and 6 weeks or less life expectancy. CSW sent referral in hub and LVM with Cassandra due to not having palliative note for direct referral to [facility]. TOC to follow.      Expected Discharge Plan and Services                                                 Social Determinants of Health (SDOH) Interventions    Readmission Risk Interventions Readmission Risk Prevention Plan 08/29/2020  Transportation Screening Complete  Home Care Screening Complete  Medication Review (RN CM) Complete  Some recent data might be hidden

## 2020-10-27 ENCOUNTER — Inpatient Hospital Stay (HOSPITAL_COMMUNITY)
Admission: RE | Admit: 2020-10-27 | Discharge: 2020-10-27 | DRG: 951 | Disposition: A | Discharge status: Hospice | Source: Hospice | Attending: Family Medicine | Admitting: Family Medicine

## 2020-10-27 ENCOUNTER — Inpatient Hospital Stay (HOSPITAL_COMMUNITY)
Admit: 2020-10-27 | Discharge: 2020-11-28 | DRG: 951 | Disposition: E | Source: Ambulatory Visit | Attending: Internal Medicine | Admitting: Internal Medicine

## 2020-10-27 ENCOUNTER — Encounter (HOSPITAL_COMMUNITY): Payer: Self-pay | Admitting: Family Medicine

## 2020-10-27 DIAGNOSIS — T501X6A Underdosing of loop [high-ceiling] diuretics, initial encounter: Secondary | ICD-10-CM | POA: Diagnosis present

## 2020-10-27 DIAGNOSIS — Z83438 Family history of other disorder of lipoprotein metabolism and other lipidemia: Secondary | ICD-10-CM

## 2020-10-27 DIAGNOSIS — Z91138 Patient's unintentional underdosing of medication regimen for other reason: Secondary | ICD-10-CM | POA: Diagnosis not present

## 2020-10-27 DIAGNOSIS — R296 Repeated falls: Secondary | ICD-10-CM | POA: Diagnosis present

## 2020-10-27 DIAGNOSIS — E119 Type 2 diabetes mellitus without complications: Secondary | ICD-10-CM

## 2020-10-27 DIAGNOSIS — J9611 Chronic respiratory failure with hypoxia: Secondary | ICD-10-CM | POA: Diagnosis present

## 2020-10-27 DIAGNOSIS — Z841 Family history of disorders of kidney and ureter: Secondary | ICD-10-CM

## 2020-10-27 DIAGNOSIS — G9341 Metabolic encephalopathy: Secondary | ICD-10-CM

## 2020-10-27 DIAGNOSIS — Z8249 Family history of ischemic heart disease and other diseases of the circulatory system: Secondary | ICD-10-CM

## 2020-10-27 DIAGNOSIS — I5032 Chronic diastolic (congestive) heart failure: Secondary | ICD-10-CM

## 2020-10-27 DIAGNOSIS — Z9981 Dependence on supplemental oxygen: Secondary | ICD-10-CM

## 2020-10-27 DIAGNOSIS — U071 COVID-19: Secondary | ICD-10-CM | POA: Diagnosis present

## 2020-10-27 DIAGNOSIS — I110 Hypertensive heart disease with heart failure: Secondary | ICD-10-CM | POA: Diagnosis present

## 2020-10-27 DIAGNOSIS — R4182 Altered mental status, unspecified: Secondary | ICD-10-CM

## 2020-10-27 DIAGNOSIS — Z825 Family history of asthma and other chronic lower respiratory diseases: Secondary | ICD-10-CM

## 2020-10-27 DIAGNOSIS — F0391 Unspecified dementia with behavioral disturbance: Secondary | ICD-10-CM | POA: Diagnosis present

## 2020-10-27 DIAGNOSIS — R451 Restlessness and agitation: Secondary | ICD-10-CM | POA: Diagnosis present

## 2020-10-27 DIAGNOSIS — Z66 Do not resuscitate: Secondary | ICD-10-CM | POA: Diagnosis present

## 2020-10-27 DIAGNOSIS — Z8616 Personal history of COVID-19: Secondary | ICD-10-CM | POA: Diagnosis present

## 2020-10-27 DIAGNOSIS — J439 Emphysema, unspecified: Secondary | ICD-10-CM | POA: Diagnosis present

## 2020-10-27 DIAGNOSIS — Z8673 Personal history of transient ischemic attack (TIA), and cerebral infarction without residual deficits: Secondary | ICD-10-CM | POA: Diagnosis not present

## 2020-10-27 DIAGNOSIS — Z7189 Other specified counseling: Secondary | ICD-10-CM

## 2020-10-27 DIAGNOSIS — C3492 Malignant neoplasm of unspecified part of left bronchus or lung: Secondary | ICD-10-CM | POA: Diagnosis present

## 2020-10-27 DIAGNOSIS — E11649 Type 2 diabetes mellitus with hypoglycemia without coma: Secondary | ICD-10-CM | POA: Diagnosis present

## 2020-10-27 DIAGNOSIS — Z8551 Personal history of malignant neoplasm of bladder: Secondary | ICD-10-CM

## 2020-10-27 DIAGNOSIS — Z515 Encounter for palliative care: Secondary | ICD-10-CM | POA: Diagnosis present

## 2020-10-27 DIAGNOSIS — I10 Essential (primary) hypertension: Secondary | ICD-10-CM | POA: Diagnosis present

## 2020-10-27 DIAGNOSIS — Z8701 Personal history of pneumonia (recurrent): Secondary | ICD-10-CM | POA: Diagnosis not present

## 2020-10-27 DIAGNOSIS — Z823 Family history of stroke: Secondary | ICD-10-CM | POA: Diagnosis not present

## 2020-10-27 DIAGNOSIS — N390 Urinary tract infection, site not specified: Secondary | ICD-10-CM | POA: Diagnosis present

## 2020-10-27 DIAGNOSIS — J449 Chronic obstructive pulmonary disease, unspecified: Secondary | ICD-10-CM

## 2020-10-27 DIAGNOSIS — Z833 Family history of diabetes mellitus: Secondary | ICD-10-CM | POA: Diagnosis not present

## 2020-10-27 DIAGNOSIS — Z87891 Personal history of nicotine dependence: Secondary | ICD-10-CM

## 2020-10-27 DIAGNOSIS — Y92009 Unspecified place in unspecified non-institutional (private) residence as the place of occurrence of the external cause: Secondary | ICD-10-CM

## 2020-10-27 DIAGNOSIS — F0390 Unspecified dementia without behavioral disturbance: Secondary | ICD-10-CM | POA: Diagnosis present

## 2020-10-27 DIAGNOSIS — F0280 Dementia in other diseases classified elsewhere without behavioral disturbance: Secondary | ICD-10-CM

## 2020-10-27 DIAGNOSIS — G309 Alzheimer's disease, unspecified: Secondary | ICD-10-CM

## 2020-10-27 DIAGNOSIS — W19XXXA Unspecified fall, initial encounter: Secondary | ICD-10-CM

## 2020-10-27 LAB — CBC
HCT: 39.5 % (ref 36.0–46.0)
Hemoglobin: 11.4 g/dL — ABNORMAL LOW (ref 12.0–15.0)
MCH: 26 pg (ref 26.0–34.0)
MCHC: 28.9 g/dL — ABNORMAL LOW (ref 30.0–36.0)
MCV: 90 fL (ref 80.0–100.0)
Platelets: 181 10*3/uL (ref 150–400)
RBC: 4.39 MIL/uL (ref 3.87–5.11)
RDW: 15.8 % — ABNORMAL HIGH (ref 11.5–15.5)
WBC: 5.1 10*3/uL (ref 4.0–10.5)
nRBC: 0 % (ref 0.0–0.2)

## 2020-10-27 LAB — BASIC METABOLIC PANEL
Anion gap: 8 (ref 5–15)
BUN: 14 mg/dL (ref 8–23)
CO2: 34 mmol/L — ABNORMAL HIGH (ref 22–32)
Calcium: 9.1 mg/dL (ref 8.9–10.3)
Chloride: 100 mmol/L (ref 98–111)
Creatinine, Ser: 0.63 mg/dL (ref 0.44–1.00)
GFR, Estimated: >60 mL/min (ref >60)
Glucose, Bld: 134 mg/dL — ABNORMAL HIGH (ref 70–99)
Potassium: 4.2 mmol/L (ref 3.5–5.1)
Sodium: 142 mmol/L (ref 135–145)

## 2020-10-27 LAB — GLUCOSE, CAPILLARY
Glucose-Capillary: 104 mg/dL — ABNORMAL HIGH (ref 70–99)
Glucose-Capillary: 122 mg/dL — ABNORMAL HIGH (ref 70–99)
Glucose-Capillary: 136 mg/dL — ABNORMAL HIGH (ref 70–99)

## 2020-10-27 MED ORDER — DIPHENHYDRAMINE HCL 50 MG/ML IJ SOLN: 12.5000 mg | INTRAMUSCULAR | Status: DC | PRN

## 2020-10-27 MED ORDER — SODIUM CHLORIDE 0.9% FLUSH
3.0000 mL | Freq: Two times a day (BID) | INTRAVENOUS | Status: DC
  Administered 2020-10-27 – 2020-10-28 (×2): 3 mL via INTRAVENOUS

## 2020-10-27 MED ORDER — ONDANSETRON 4 MG PO TBDP: 4.0000 mg | ORAL_TABLET | Freq: Four times a day (QID) | ORAL | Status: DC | PRN

## 2020-10-27 MED ORDER — HALOPERIDOL LACTATE 5 MG/ML IJ SOLN: 0.5000 mg | INTRAMUSCULAR | Status: DC | PRN

## 2020-10-27 MED ORDER — ACETAMINOPHEN 650 MG RE SUPP: 650.0000 mg | Freq: Four times a day (QID) | RECTAL | Status: DC | PRN

## 2020-10-27 MED ORDER — MORPHINE SULFATE (CONCENTRATE) 10 MG/0.5ML PO SOLN
5.0000 mg | ORAL | Status: DC | PRN
  Administered 2020-10-27: 5 mg via ORAL

## 2020-10-27 MED ORDER — GLYCOPYRROLATE 0.2 MG/ML IJ SOLN: 0.2000 mg | INTRAMUSCULAR | Status: DC | PRN

## 2020-10-27 MED ORDER — FENTANYL CITRATE (PF) 100 MCG/2ML IJ SOLN
25.0000 ug | INTRAMUSCULAR | Status: DC | PRN
  Administered 2020-10-28: 25 ug via INTRAVENOUS
  Filled 2020-10-27: qty 2

## 2020-10-27 MED ORDER — LORAZEPAM 2 MG/ML IJ SOLN: 1.0000 mg | INTRAMUSCULAR | Status: DC | PRN

## 2020-10-27 MED ORDER — SODIUM CHLORIDE 0.9% FLUSH: 3.0000 mL | INTRAVENOUS | Status: DC | PRN

## 2020-10-27 MED ORDER — LORAZEPAM 2 MG/ML IJ SOLN
1.0000 mg | INTRAMUSCULAR | Status: DC | PRN
  Administered 2020-10-27 – 2020-10-29 (×6): 1 mg via INTRAVENOUS
  Filled 2020-10-27 (×6): qty 1

## 2020-10-27 MED ORDER — ALBUTEROL SULFATE (2.5 MG/3ML) 0.083% IN NEBU: 2.5000 mg | INHALATION_SOLUTION | RESPIRATORY_TRACT | Status: DC | PRN

## 2020-10-27 MED ORDER — LORAZEPAM 1 MG PO TABS: 1.0000 mg | ORAL_TABLET | ORAL | Status: DC | PRN

## 2020-10-27 MED ORDER — ACETAMINOPHEN 325 MG PO TABS: 650.0000 mg | ORAL_TABLET | Freq: Four times a day (QID) | ORAL | Status: DC | PRN

## 2020-10-27 MED ORDER — GLYCOPYRROLATE 1 MG PO TABS: 1.0000 mg | ORAL_TABLET | ORAL | Status: DC | PRN

## 2020-10-27 MED ORDER — POLYVINYL ALCOHOL 1.4 % OP SOLN: 1.0000 [drp] | Freq: Four times a day (QID) | OPHTHALMIC | Status: DC | PRN

## 2020-10-27 MED ORDER — HALOPERIDOL LACTATE 2 MG/ML PO CONC
0.5000 mg | ORAL | Status: DC | PRN
  Filled 2020-10-27: qty 0.3

## 2020-10-27 MED ORDER — BIOTENE DRY MOUTH MT LIQD: 15.0000 mL | OROMUCOSAL | Status: DC | PRN

## 2020-10-27 MED ORDER — MORPHINE SULFATE (CONCENTRATE) 10 MG/0.5ML PO SOLN: 5.0000 mg | ORAL | Status: DC | PRN

## 2020-10-27 MED ORDER — HALOPERIDOL 0.5 MG PO TABS: 0.5000 mg | ORAL_TABLET | ORAL | Status: DC | PRN

## 2020-10-27 MED ORDER — LORAZEPAM 2 MG/ML PO CONC: 1.0000 mg | ORAL | Status: DC | PRN

## 2020-10-27 MED ORDER — BISACODYL 10 MG RE SUPP: 10.0000 mg | Freq: Every day | RECTAL | Status: DC | PRN

## 2020-10-27 MED ORDER — LORAZEPAM 2 MG/ML IJ SOLN
0.5000 mg | Freq: Four times a day (QID) | INTRAMUSCULAR | Status: DC | PRN
Start: 2020-10-27 — End: 2020-10-27
  Administered 2020-10-27: 1 mg via INTRAVENOUS
  Filled 2020-10-27: qty 1

## 2020-10-27 MED ORDER — SODIUM CHLORIDE 0.9 % IV SOLN: 250.0000 mL | INTRAVENOUS | Status: DC | PRN

## 2020-10-27 MED ORDER — HALOPERIDOL LACTATE 2 MG/ML PO CONC: 0.5000 mg | ORAL | Status: DC | PRN

## 2020-10-27 MED ORDER — ONDANSETRON HCL 4 MG/2ML IJ SOLN: 4.0000 mg | Freq: Four times a day (QID) | INTRAMUSCULAR | Status: DC | PRN

## 2020-10-27 MED ORDER — POLYVINYL ALCOHOL 1.4 % OP SOLN
1.0000 [drp] | Freq: Four times a day (QID) | OPHTHALMIC | Status: DC | PRN
Start: 2020-10-27 — End: 2020-10-27

## 2020-10-27 MED ORDER — MORPHINE SULFATE (CONCENTRATE) 10 MG/0.5ML PO SOLN
5.0000 mg | ORAL | Status: DC | PRN
  Administered 2020-10-28 – 2020-10-29 (×5): 5 mg via SUBLINGUAL
  Filled 2020-10-27 (×6): qty 0.5

## 2020-10-27 MED ORDER — FENTANYL CITRATE (PF) 100 MCG/2ML IJ SOLN: 25.0000 ug | INTRAMUSCULAR | Status: DC | PRN

## 2020-10-27 MED ORDER — SODIUM CHLORIDE 0.9% FLUSH: 3.0000 mL | Freq: Two times a day (BID) | INTRAVENOUS | Status: DC

## 2020-10-27 MED ORDER — MORPHINE SULFATE (CONCENTRATE) 10 MG/0.5ML PO SOLN
5.0000 mg | ORAL | Status: DC | PRN
Start: 2020-10-27 — End: 2020-10-27

## 2020-10-27 MED ORDER — MORPHINE SULFATE (CONCENTRATE) 10 MG/0.5ML PO SOLN
2.6000 mg | ORAL | Status: DC | PRN
  Administered 2020-10-27: 5 mg via ORAL
  Filled 2020-10-27: qty 0.5

## 2020-10-27 NOTE — Consult Note (Signed)
Consultation Note Date: 10/26/2020   Patient Name: Sonya Oliver  DOB: 1940-11-13  MRN: [id number]  Age / Sex: 80 y.o., female  PCP: Loman Brooklyn, FNP Referring Physician: Roxan Hockey, MD  Reason for Consultation: Establishing goals of care and Hospice Evaluation  HPI/Patient Profile: 80 y.o. female  with past medical history of COPD, diabetes, diastolic heart failure, history of COVID-19 pneumonia, history of pulmonary edema, history of bladder cancer 2015, osteoporosis, cataract, arthritis, admitted on 10/22/2020 with advanced dementia with significant cognitive and memory deficits, poor oral intake.   Clinical Assessment and Goals of Care: I have reviewed medical records including EPIC notes, labs and imaging, received report from attending, examined the patient.  Mrs. Padron is lying quietly in bed.  She appears acutely/chronically ill and very frail.  She will not open her eyes to voice or touch.  She does not respond in any meaningful way.  There is no family at bedside at this time.  Call to daughter, [name] (no answer, left message).  Call to son, Roena Malady to discuss diagnosis prognosis, [place], EOL wishes, disposition and options.  I introduced Palliative Medicine as specialized medical care for people living with serious illness. It focuses on providing relief from the symptoms and stress of a serious illness.   We discussed her current illness and what it means in the larger context of her on-going co-morbidities.  Natural disease trajectory and expectations at EOL were discussed.  Iona Beard states that he and his Sister [name] are the primary decision makers.  He states that they would like comfort and dignity at end-of-life, residential hospice.  Iona Beard states that he spoke with the hospice provider yesterday and was told that they are waiting on information from the  hospital.  Hospice services were explained and offered.  We talked about what is and is not provided in residential hospice including, but not limited to, and burdening Mrs. Gervais from painful lab draws, no further IV fluids.  Family is in agreement.  Advanced directives, concepts specific to code status, were considered and discussed.  Mrs. Ruff is DNR.  Questions and concerns were addressed.  The family was encouraged to call with questions or concerns.  Encouraged family to immediately hospice for support.  Conference with attending, bedside nursing staff, transition of care team related to patient condition, needs, goals of care. Comfort care orders initiated, orders adjusted.   HCPOA   NEXT OF KIN  -multiple family members listed in chart, [name] is primary. Along with son Roena Malady.   SUMMARY OF RECOMMENDATIONS   Requesting comfort and dignity at end-of-life, residential hospice at [place]:  DNR  Symptom Management:   End-of-life order set implemented  Palliative Prophylaxis:   Frequent Pain Assessment, Oral Care and Turn Reposition  Additional Recommendations (Limitations, Scope, Preferences):  Full Comfort Care  Psycho-social/Spiritual:   Desire for further Chaplaincy support:no  Additional Recommendations: Caregiving  Support/Resources and Education on Hospice  Prognosis:   < 2 weeks,  would be expected based on advanced dementia, frailty, poor by mouth intake.  Discharge Planning: Family is requesting comfort and dignity at end-of-life, residential hospice at [place].      Primary Diagnoses: Present on Admission: . Edema leg . COPD (chronic obstructive pulmonary disease) ([place]) . Essential hypertension . Chronic diastolic CHF (congestive heart failure) ([place]) . Mass of lower lobe of left lung . Chronic respiratory failure with hypoxia ([place]) . Acute lower UTI . Acute metabolic  encephalopathy   I have reviewed the medical record, interviewed the patient and family, and examined the patient. The following aspects are pertinent.  Past Medical History:  Diagnosis Date  . Arthritis   . Bladder cancer ([place]) 2015  . Cataract   . CHF (congestive heart failure) ([place])    NO CARDIOLOGIST---  MONITORED BY PCP DR Jacelyn Grip  . COPD with emphysema ([place])    last Acute Exacerbation 08-21-2014  . H/O pulmonary edema    jan 2014--  acute CHF w/ pulmonary edema  . Hypertension   . Hyperthyroidism    currently no meds -- labs stable  . Nocturia   . Nocturnal oxygen desaturation    uses O2  HS via [place]--  and PRN daytime  . Osteoporosis   . Stroke ([place])   . Type 2 diabetes mellitus ([place])   . Ureteral tumor    Social History   Socioeconomic History  . Marital status: Widowed    Spouse name: Not on file  . Number of children: 4  . Years of education: 10  . Highest education level: 10th grade  Occupational History  . 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 Smoking status: Former Smoker    Packs/day: 0.25    Years: 50.00    Pack years: 12.50    Types: Cigarettes    Quit date: 09/24/2006    Years since quitting: 14.1  . Smokeless tobacco: Never Used  Vaping Use  . Vaping Use: Never used  Substance and Sexual Activity  . Alcohol use: No  . Drug use: No  . Sexual activity: Not Currently  Other Topics Concern  . Not on file  Social History Narrative   Occupation: worked in [place] yrs   Social Determinants of Radio broadcast assistant Strain: Not on [employer] Insecurity: Not on file  Transportation Needs: Not on file  Physical Activity: Not on file  Stress: Not on file  Social Connections: Not on file   Family History  Problem Relation Age of Onset  . Diabetes Father   . Congestive Heart Failure Father   . COPD Father   . Kidney disease Father   . Hypertension Father   . Diabetes Mother        with kidney disease  . Kidney  disease Mother   . Hypertension Mother   . Hyperlipidemia Mother   . Heart attack Mother   . Stroke Mother   . Heart attack Brother   . Asthma Brother   . Diabetes Daughter   . Diabetes Son   . Cancer Brother        throat  . Hypertension Brother    Scheduled Meds: . amLODipine  2.5 mg Oral Daily  . atorvastatin  10 mg Oral Daily  . carvedilol  3.125 mg Oral BID  . enoxaparin (LOVENOX) injection  40 mg Subcutaneous Q24H  . insulin aspart  0-6 Units Subcutaneous Q6H  . methimazole  5 mg Oral Daily  .  mometasone-formoterol  2 puff Inhalation BID   Continuous Infusions: . sodium chloride Stopped (10/23/20 1730)  . dextrose 5 % and 0.45 % NaCl with KCl 40 mEq/L 40 mL/hr at 10/10/2020 0039   PRN Meds:.sodium chloride, acetaminophen **OR** acetaminophen, ipratropium-albuterol, labetalol, LORazepam, polyethylene glycol Medications Prior to Admission:  Prior to Admission medications   Medication Sig Start Date End Date Taking? Authorizing Provider  albuterol (PROVENTIL) (2.5 MG/3ML) 0.083% nebulizer solution Inhale 3 mLs into the lungs 4 (four) times daily as needed for shortness of breath. 02/19/19  Yes Terald Sleeper, PA-C  albuterol (VENTOLIN HFA) 108 (90 Base) MCG/ACT inhaler INHALE 1-2 PUFFS BY MOUTH EVERY 6 HOURS AS NEEDED FOR WHEEZE OR SHORTNESS OF BREATH Patient taking differently: Inhale 1-2 puffs into the lungs every 6 (six) hours as needed for wheezing or shortness of breath. 10/06/20  Yes Hendricks Limes F, FNP  amLODipine (NORVASC) 2.5 MG tablet Take 1 tablet (2.5 mg total) by mouth daily. 09/18/20  Yes Hendricks Limes F, FNP  atorvastatin (LIPITOR) 10 MG tablet TAKE 1 TABLET BY MOUTH EVERY DAY Patient taking differently: Take 10 mg by mouth daily. 08/25/20  Yes Claretta Fraise, MD  carvedilol (COREG) 3.125 MG tablet Take 1 tablet (3.125 mg total) by mouth 2 (two) times daily. 08/25/20  Yes Stacks, Cletus Gash, MD  Fluticasone-Salmeterol (ADVAIR DISKUS) 100-50 MCG/DOSE AEPB Inhale 1 puff  into the lungs 2 (two) times daily. 05/16/20  Yes Hendricks Limes F, FNP  furosemide (LASIX) 40 MG tablet Take 1 tablet (40 mg total) by mouth daily. 09/18/20  Yes Hendricks Limes F, FNP  lisinopril (ZESTRIL) 40 MG tablet Take 1 tablet (40 mg total) by mouth daily. 08/07/20  Yes Gwenlyn Perking, FNP  metFORMIN (GLUCOPHAGE) 500 MG tablet Take 1 tablet (500 mg total) by mouth daily. 07/10/20  Yes Gwenlyn Perking, FNP  methimazole (TAPAZOLE) 10 MG tablet Take 0.5 tablets (5 mg total) by mouth daily. 09/22/20  Yes Brita Romp, NP  OXYGEN Inhale 2 L into the lungs as needed.   Yes [provider]  pantoprazole (PROTONIX) 40 MG tablet Take 1 tablet (40 mg total) by mouth daily for 10 days. 09/18/20 09/28/20 Yes Hendricks Limes F, FNP  potassium chloride SA (KLOR-CON) 20 MEQ tablet Take 1 tablet (20 mEq total) by mouth daily. 08/25/20  Yes Claretta Fraise, MD  alendronate (FOSAMAX) 70 MG tablet TAKE 1 TABLET ONCE A WEEK. TAKE WITH A FULL GLASS OF WATER ON AN EMPTY STOMACH. Patient not taking: No sig reported 10/07/20   Loman Brooklyn, FNP  Cholecalciferol (VITAMIN D3) 2000 UNITS TABS Take 1 tablet by mouth daily. Patient not taking: Reported on 10/22/2020    [provider]  cycloSPORINE (RESTASIS MULTIDOSE) 0.05 % ophthalmic emulsion Place 1 drop into both eyes 2 (two) times daily. Patient not taking: Reported on 10/22/2020 04/23/20   Loman Brooklyn, FNP  guaiFENesin-dextromethorphan (ROBITUSSIN DM) 100-10 MG/5ML syrup Take 10 mLs by mouth every 4 (four) hours as needed for cough. Patient not taking: No sig reported 09/01/20   Heath Lark D, DO   No Known Allergies Review of Systems  Unable to perform ROS: Age  All other systems reviewed and are negative.   Physical Exam Vitals and nursing note reviewed.  Constitutional:      General: She is not in acute distress.    Appearance: She is ill-appearing.  HENT:     Head: Atraumatic.  Cardiovascular:     Rate and Rhythm: Normal  rate.  Pulmonary:     Effort: Pulmonary effort is normal. No respiratory distress.  Abdominal:     General: Abdomen is flat. There is no distension.     Tenderness: There is no guarding.  Skin:    General: Skin is warm and dry.  Neurological:     Comments: Does not respond to voice or touch     Vital Signs: BP (!) 146/83 (BP Location: Left Arm)   Pulse 77   Temp (!) 97 F (36.1 C)   Resp 18   Ht 5\' 3"  (1.6 m)   Wt 74.3 kg   SpO2 98%   BMI 29.02 kg/m  Pain Scale: PAINAD   Pain Score: 0-No pain   SpO2: SpO2: 98 % O2 Device:SpO2: 98 % O2 Flow Rate: .O2 Flow Rate (L/min): 2 L/min  IO: Intake/output summary:   Intake/Output Summary (Last 24 hours) at 10/21/2020 0813 Last data filed at 10/01/2020 0700 Gross per 24 hour  Intake 722.81 ml  Output 600 ml  Net 122.81 ml    LBM: Last BM Date: 10/26/20 Baseline Weight: Weight: 74.9 kg Most recent weight: Weight: 74.3 kg     Palliative Assessment/Data:   Flowsheet Rows   Flowsheet Row Most Recent Value  Intake Tab   Referral Department Hospitalist  Unit at Time of Referral Cardiac/Telemetry Unit  Palliative Care Primary Diagnosis Other (Comment)  Date Notified 10/24/20  Palliative Care Type New Palliative care  Reason for referral Clarify Goals of Care  Date of Admission 10/22/20  Date first seen by Palliative Care 09/30/2020  # of days Palliative referral response time 3 Day(s)  # of days IP prior to Palliative referral 2  Clinical Assessment   Palliative Performance Scale Score 30%  Pain Max last 24 hours Not able to report  Pain Min Last 24 hours Not able to report  Dyspnea Max Last 24 Hours Not able to report  Dyspnea Min Last 24 hours Not able to report  Psychosocial & Spiritual Assessment   Palliative Care Outcomes       Time In: 0910 Time Out: 1020 Time Total: 70 minutes  Greater than 50%  of this time was spent counseling and coordinating care related to the above assessment and plan.  Signed  by: Drue Novel, NP   Please contact Palliative Medicine Team phone at [phone] for questions and concerns.  For individual provider: See Shea Evans

## 2020-10-27 NOTE — Discharge Summary (Signed)
  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, diabetes mellitus, diastolic CHF, and prior Covid pneumonia infection admitted on 10/22/2020 after a fall with worsening confusion and behavior problems- -patient has developed recurrent hypoglycemic episodes due to inability to have oral intake in the setting of progressive dementia with increased confusion and behavioral disturbance --Patient has been requiring IV dextrose to avoid significant hypoglycemia -Given very poor  oral intake with recurrent hypoglycemic episodes expected life expectancy is less than 6 weeks  A/p 1) advanced dementia with significant cognitive and memory deficits and behavioral disturbance--- patient's dementia has progressed to the point where she is no longer able to eat -Given very poor  oral intake with recurrent hypoglycemic episodes expected life expectancy is less than 6 weeks --palliative care and hospice consult appreciated -Family has decided to transition to full comfort care -However patient COVID-19 test came back positive, patient has a history of recent Covid infection back in December 2021 -Awaiting to see if residential hospice to accept patient if patient needs to stay here under GIP/hospice service  2)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 Melvyn Novas pulmonologist, recommends outpatient PET scan and further work-up after discussing goals of care with family members -Discussed with patient's daughter [name] [facility], also discussed with patient's son Iona Beard and patient's daughter-in-law Yolonda Kida- --the family Does Not want biopsy/chemo or other invasive/aggressive treatment -At this time family does not desire to pursue aggressive work-up --palliative care and hospice consult appreciated -Family has decided to transition to full comfort care  3)Social/Ethics--HCPOA is  [name], but patient stays with her son Mr. Legrand Como his wife Ileana Roup  -Sadlypatient continues to decline, very poor oral intake, -Episodes of lethargy alternating with episodes of severe agitation and confusion -Not a candidate for PEG tube given very advanced dementia with significant cognitive and memory deficits and underlying left lung masspresumed to be malignant --Discussed goals of care and possible palliative/hospice involvement with patient's daughter [name] and patient's son Iona Beard and daughter-in-lawToni ---Theyrequest DNR status --Family had conversations with hospice Team and have decided to transition to comfort care mode with transfer to [facility]--- residential hospice --Palliative care and Hospice consult appreciated -Family has decided to Transition to full comfort care  --Total care time is over 25 minutes with more than 50% spent in counseling and coordination of care  --Please see full progress note dated 10/25/2020  10/11/2020 --palliative care and hospice consult appreciated -Family has decided to transition to full comfort care  Roxan Hockey, MD

## 2020-10-27 NOTE — Progress Notes (Signed)
Has been resting comfortably now for the afternoon.  Daughter was at bedside for several hours

## 2020-10-27 NOTE — TOC Progression Note (Signed)
Transition of Care [facility]) - Progression Note    Patient Details  Name: Sonya Oliver MRN: [id number] Date of Birth: [birth year]  [facility] CM/SW Contact  Salome Arnt,  Phone Number: 10/22/2020, 2:25 PM  Clinical Narrative:  Cassandra at Hospice notified COVID test positive yesterday. Discussed pt was positive in December. However, due to negative test on admission and positive test yesterday, Hospice will plan to make pt GIP. Consents have been signed and okay to flip chart per hospice. MD notified.             Expected Discharge Plan and Services                                                 Social Determinants of Health (SDOH) Interventions    Readmission Risk Interventions Readmission Risk Prevention Plan 08/29/2020  Transportation Screening Complete  Home Care Screening Complete  Medication Review (RN CM) Complete  Some recent data might be hidden

## 2020-10-27 NOTE — H&P (Signed)
Patient Demographics:    Sonya Oliver, is a 80 y.o. 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 /significant memory and cognitive deficits/end-stage Active Problems:   End of life care   Palliative care encounter   Hospice care patient   2019 novel coronavirus disease (COVID-19)   Recurrent falls   COPD (chronic obstructive pulmonary disease) ([place])   Essential hypertension   Diabetes mellitus, type 2 (HCC)   Chronic diastolic CHF (congestive heart failure) ([place])   History of COVID-19   Chronic respiratory failure with hypoxia ([place])   Fall at home, initial encounter   Acute metabolic encephalopathy    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, diabetes mellitus, diastolic CHF, and prior Covid pneumonia infection admitted on 10/22/2020 after a fall with worsening confusion and behavior problems- -patient has developed recurrent hypoglycemic episodes due to inability to have oral intake in the setting of progressive dementia with increased confusion and behavioral disturbance --Patient has been requiring IV dextrose to avoid significant hypoglycemia -Given very poor oral intake with recurrent hypoglycemic episodes expected life expectancy is less than 6 weeks -unable to transfer to residential hospice house at this time due to Covid positive status --Anticipate in-hospital death  A/p 1) advanced dementia with significant cognitive and memory deficits and behavioral disturbance--- patient's dementia has progressed to the  point where she is no longer able to eat -Given very poor oral intake with recurrent hypoglycemic episodes expected life expectancy is less than 6 weeks --palliative care and hospice consultappreciated -Family has decided to transition to full comfort care -However patient COVID-19 test came back positive, patient has a history of recent Covid infection back in December 2021 -Awaiting to see if residential hospice to accept patient if patient needs to stay here under GIP/hospice service  2)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 Melvyn Novas pulmonologist, recommends outpatient PET scan and further work-up after discussing goals of care with family members -Discussed with patient's daughter [name] [facility], also discussed with patient's son Iona Beard and patient's daughter-in-law Yolonda Kida- --the family Does Not want biopsy/chemo or other invasive/aggressive treatment -At this time family does not desire to pursue aggressive work-up --palliative care and hospice consultappreciated -Family has decided to transition to full comfort care  3)Social/Ethics--HCPOA is [name], but patient stays with her son Mr. Legrand Como his wife Ileana Roup  -Sadlypatient continues to decline, very poor oral intake, -Episodes of lethargy alternating with episodes of severe agitation and confusion -Not a candidate for PEG tube given very advanced dementia with significant cognitive and memory deficits and underlying left lung masspresumed to be malignant --Discussed goals of care and possible palliative/hospice involvement with patient's daughter  [name] and patient's son Iona Beard and daughter-in-lawToni ---Theyrequest DNR status --Family had conversations with hospice Team and have decided to transition to comfort care mode with transfer to [facility]--- residential hospice --Palliative care and Hospice consultappreciated -Family has decided toTransition to  full comfort care  --Total care time is over 25 minutes with more than 50% spent in counseling and coordination of care  --Please see full progress note dated 10/25/2020  10/25/2020 --palliative care and hospice consultappreciated -Family has decided to transition to full comfort care   Disposition--- unable to transfer to residential hospice house at this time due to Covid positive status  Status is: Inpatient  Remains inpatient appropriate because:Please see above -unable to transfer to residential hospice house at this time due to Covid positive status  Dispo: The patient is from: Home              Anticipated d/c is to: GIP status              Anticipated d/c date is: 2 days              Patient currently is not medically stable to d/c. Barriers: Not Clinically Stable- --Covid positive status -Anticipate in-hospital death   With History of - Reviewed by me  Past Medical History:  Diagnosis Date  . Arthritis   . Bladder cancer ([place]) 2015  . Cataract   . CHF (congestive heart failure) ([place])    NO CARDIOLOGIST---  MONITORED BY PCP DR Jacelyn Grip  . COPD with emphysema ([place])    last Acute Exacerbation 08-21-2014  . H/O pulmonary edema    jan 2014--  acute CHF w/ pulmonary edema  . Hypertension   . Hyperthyroidism    currently no meds -- labs stable  . Nocturia   . Nocturnal oxygen desaturation    uses O2  HS via [place]--  and PRN daytime  . Osteoporosis   . Stroke ([place])   . Type 2 diabetes mellitus ([place])   . Ureteral tumor       Past Surgical History:  Procedure Laterality Date  . ABDOMINAL HYSTERECTOMY  1980'S   W/ BILATERAL SALPINGO-OOPHORECTOMY  . CATARACT EXTRACTION W/PHACO Left 01/31/2014   Procedure: CATARACT EXTRACTION PHACO AND INTRAOCULAR LENS PLACEMENT (IOC);  Surgeon: Tonny Branch, MD;  Location: [facility];  Service: Ophthalmology;  Laterality: Left;  CDE: 23.78  . CATARACT EXTRACTION W/PHACO Right 02/28/2014   Procedure: CATARACT EXTRACTION PHACO AND INTRAOCULAR  LENS PLACEMENT (IOC);  Surgeon: Tonny Branch, MD;  Location: [facility];  Service: Ophthalmology;  Laterality: Right;  CDE:  11.34  . CYSTOSCOPY  03/23/2012   Procedure: CYSTOSCOPY;  Surgeon: Claybon Jabs, MD;  Location: [facility];  Service: Urology;  Laterality: N/A;  . CYSTOSCOPY WITH RETROGRADE PYELOGRAM, URETEROSCOPY AND STENT PLACEMENT Right 10/21/2014   Procedure: CYSTOSCOPY WITH RETROGRADE PYELOGRAM, URETEROSCOPY, BLADDER TUMOR BIOPSY;  Surgeon: Claybon Jabs, MD;  Location: [facility];  Service: Urology;  Laterality: Right;  . TRANSTHORACIC ECHOCARDIOGRAM  06-21-2013   moderate LVH,  grade I diastolic dysfunction, ef 59-56%,  mild MR & TR  . TRANSURETHRAL RESECTION OF BLADDER TUMOR  03/23/2012   Procedure: TRANSURETHRAL RESECTION OF BLADDER TUMOR (TURBT);  Surgeon: Claybon Jabs, MD;  Location: [facility];  Service: Urology;  Laterality: N/A;  . TRANSURETHRAL RESECTION OF BLADDER TUMOR N/A 01/08/2013   Procedure: TRANSURETHRAL RESECTION OF BLADDER TUMOR (TURBT);  Surgeon: Claybon Jabs, MD;  Location[facility];  Service: Urology;  Laterality:  N/A;      No chief complaint on file.     HPI:    Berneice Zettlemoyer  is a 80 y.o. 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, diabetes mellitus, diastolic CHF, and prior Covid pneumonia infection admitted on 10/22/2020 after a fall with worsening confusion and behavior problems- -patient has developed recurrent hypoglycemic episodes due to inability to have oral intake in the setting of progressive dementia with increased confusion and behavioral disturbance --Patient has been requiring IV dextrose to avoid significant hypoglycemia -Given very poor oral intake with recurrent hypoglycemic episodes expected life expectancy is less than 6 weeks -unable to transfer to residential hospice  house at this time due to Covid positive status --Anticipate in-hospital death    Review of systems:    In addition to the HPI above,   A full Review of  Systems was done, all other systems reviewed are negative except as noted above in HPI , .    Social History:  Reviewed by me    Social History   Tobacco Use  . Smoking status: Former Smoker    Packs/day: 0.25    Years: 50.00    Pack years: 12.50    Types: Cigarettes    Quit date: 09/24/2006    Years since quitting: 14.1  . Smokeless tobacco: Never Used  Substance Use Topics  . Alcohol use: No       Family History :  Reviewed by me    Family History  Problem Relation Age of Onset  . Diabetes Father   . Congestive Heart Failure Father   . COPD Father   . Kidney disease Father   . Hypertension Father   . Diabetes Mother        with kidney disease  . Kidney disease Mother   . Hypertension Mother   . Hyperlipidemia Mother   . Heart attack Mother   . Stroke Mother   . Heart attack Brother   . Asthma Brother   . Diabetes Daughter   . Diabetes Son   . Cancer Brother        throat  . Hypertension Brother     Home Medications:   Prior to Admission medications   Not on File     Allergies:    No Known Allergies   Physical Exam:   Vitals  There were no vitals taken for this visit.  Physical Examination: General appearance -overall appears comfortable mental status -episodes of lethargy alternating with episodes of restlessness Eyes - sclera anicteric Neck - supple, no JVD elevation , Chest -mostly clear, fair air movement  heart - S1 and S2 normal, regular  Abdomen - soft, nontender, nondistended, Neurological -episodes of lethargy alternating with episodes of restlessness Extremities - no pedal edema noted, intact peripheral pulses  Skin - warm, dry     Data Review:    CBC Recent Labs  Lab 10/22/20 1032 10/25/20 0608 10/15/2020 0617  WBC 5.5 4.8 5.1  HGB 11.1* 12.7 11.4*  HCT 37.4  43.8 39.5  PLT 157 177 181  MCV 87.8 89.4 90.0  MCH 26.1 25.9* 26.0  MCHC 29.7* 29.0* 28.9*  RDW 16.2* 16.0* 15.8*  LYMPHSABS 0.7  --   --   MONOABS 0.5  --   --   EOSABS 0.0  --   --   BASOSABS 0.0  --   --    ------------------------------------------------------------------------------------------------------------------  Chemistries  Recent Labs  Lab 10/22/20 1032 10/23/20 0423 10/25/20 0608 10/15/2020 0617  NA 144 145 142 142  K 3.3* 2.7* 4.2 4.2  CL 102 99 98 100  CO2 28 32 30 34*  GLUCOSE 81 79 122* 134*  BUN 15 13 16 14   CREATININE 0.76 0.73 0.60 0.63  CALCIUM 9.0 8.8* 9.4 9.1  MG  --  1.6* 2.3  --   AST 24  --   --   --   ALT 14  --   --   --   ALKPHOS 48  --   --   --   BILITOT 1.2  --   --   --    ------------------------------------------------------------------------------------------------------------------ estimated creatinine clearance is 53.3 mL/min (by C-G formula based on SCr of 0.63 mg/dL). ------------------------------------------------------------------------------------------------------------------ No results for input(s): TSH, T4TOTAL, T3FREE, THYROIDAB in the last 72 hours.  Invalid input(s): FREET3   Coagulation profile No results for input(s): INR, PROTIME in the last 168 hours. ------------------------------------------------------------------------------------------------------------------- No results for input(s): DDIMER in the last 72 hours. -------------------------------------------------------------------------------------------------------------------  Cardiac Enzymes No results for input(s): CKMB, TROPONINI, MYOGLOBIN in the last 168 hours.  Invalid input(s): CK ------------------------------------------------------------------------------------------------------------------    Component Value Date/Time   BNP 207.0 (H) 10/22/2020 1033   BNP 112.8 (H) 03/22/2013 1707      ---------------------------------------------------------------------------------------------------------------  Urinalysis    Component Value Date/Time   COLORURINE YELLOW 10/22/2020 1035   APPEARANCEUR HAZY (A) 10/22/2020 1035   APPEARANCEUR Clear 02/20/2019 1058   LABSPEC 1.011 10/22/2020 1035   PHURINE 5.0 10/22/2020 1035   GLUCOSEU NEGATIVE 10/22/2020 1035   HGBUR NEGATIVE 10/22/2020 [place] 10/22/2020 1035   BILIRUBINUR Negative 02/20/2019 1058   KETONESUR 5 (A) 10/22/2020 1035   PROTEINUR NEGATIVE 10/22/2020 1035   UROBILINOGEN 1.0 03/21/2012 1936   NITRITE NEGATIVE 10/22/2020 1035   LEUKOCYTESUR MODERATE (A) 10/22/2020 1035    ----------------------------------------------------------------------------------------------------------------   Imaging Results:    No results found.  Radiological Exams on Admission: No results found.  DVT Prophylaxis -SCD   AM Labs Ordered, also please review Full Orders  Family Communication: Admission, patients condition and plan of care including tests being ordered have been discussed with the family members who indicate understanding and agree with the plan   Code Status -DNR  Likely DC to  -anticipate in-hospital death  Condition- Grave prognosis  Roxan Hockey M.D on 10/24/2020 at 2:56 PM Go to [email or web address] -  for contact info  [facility] - Office  [phone]

## 2020-10-27 NOTE — Care Management Important Message (Signed)
Important Message  Patient Details  Name: Sonya Oliver MRN: [id number] Date of Birth: May 18, 1941   Medicare Important Message Given:  Yes     Tommy Medal 09/30/2020, 12:38 PM

## 2020-10-27 NOTE — Plan of Care (Signed)
Alert and oriented to self. No s/s of pain or discomfort. Incontinent of bowel and bladder. Bed alarm in use with floor mat present. IV fluids infusing. Poor po intake. Supplemental oxygen 2 l/min. Blood glucose monitored. Continue plan of care.  Problem: Education: Goal: Knowledge of General Education information will improve Description: Including pain rating scale, medication(s)/side effects and non-pharmacologic comfort measures Outcome: Progressing   Problem: Health Behavior/Discharge Planning: Goal: Ability to manage health-related needs will improve Outcome: Progressing   Problem: Clinical Measurements: Goal: Ability to maintain clinical measurements within normal limits will improve Outcome: Progressing Goal: Will remain free from infection Outcome: Progressing Goal: Diagnostic test results will improve Outcome: Progressing Goal: Respiratory complications will improve Outcome: Progressing Goal: Cardiovascular complication will be avoided Outcome: Progressing   Problem: Activity: Goal: Risk for activity intolerance will decrease Outcome: Progressing   Problem: Nutrition: Goal: Adequate nutrition will be maintained Outcome: Progressing   Problem: Coping: Goal: Level of anxiety will decrease Outcome: Progressing   Problem: Elimination: Goal: Will not experience complications related to bowel motility Outcome: Progressing Goal: Will not experience complications related to urinary retention Outcome: Progressing   Problem: Pain Managment: Goal: General experience of comfort will improve Outcome: Progressing   Problem: Safety: Goal: Ability to remain free from injury will improve Outcome: Progressing   Problem: Skin Integrity: Goal: Risk for impaired skin integrity will decrease Outcome: Progressing

## 2020-10-27 NOTE — Progress Notes (Signed)
  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, diabetes mellitus, diastolic CHF, and prior Covid pneumonia infection admitted on 10/22/2020 after a fall with worsening confusion and behavior problems- -patient has developed recurrent hypoglycemic episodes due to inability to have oral intake in the setting of progressive dementia with increased confusion and behavioral disturbance --Patient has been requiring IV dextrose to avoid significant hypoglycemia -Given very poor  oral intake with recurrent hypoglycemic episodes expected life expectancy is less than 6 weeks  A/p 1) advanced dementia with significant cognitive and memory deficits and behavioral disturbance--- patient's dementia has progressed to the point where she is no longer able to eat -Given very poor  oral intake with recurrent hypoglycemic episodes expected life expectancy is less than 6 weeks --palliative care and hospice consult appreciated -Family has decided to transition to full comfort care -However patient COVID-19 test came back positive, patient has a history of recent Covid infection back in December 2021 -Awaiting to see if residential hospice to accept patient if patient needs to stay here under GIP/hospice service  2)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 Melvyn Novas pulmonologist, recommends outpatient PET scan and further work-up after discussing goals of care with family members -Discussed with patient's daughter [name] [facility], also discussed with patient's son Iona Beard and patient's daughter-in-law Yolonda Kida- --the family Does Not want biopsy/chemo or other invasive/aggressive treatment -At this time family does not desire to pursue aggressive work-up --palliative care and hospice consult appreciated -Family has decided to transition to full comfort care  3)Social/Ethics--HCPOA is [name], but patient stays with her son Mr. Roena Malady and his wife Ileana Roup  -Sadly patient continues to decline, very poor oral intake, -Episodes of lethargy alternating with episodes of severe agitation and confusion -Not a candidate for PEG tube given very advanced dementia with significant cognitive and memory deficits and underlying left lung mass presumed to be malignant --Discussed goals of care and possible palliative/hospice involvement with patient's daughter [name] and patient's son Iona Beard and daughter-in-law Vivien Rota --- They request DNR status --Family had conversations with hospice Team and have decided to transition to comfort care mode with transfer to [facility] residential hospice --Palliative care and Hospice consult appreciated -Family has decided to Transition to full comfort care  --Total care time is over 25 minutes with more than 50% spent in counseling and coordination of care  --Please see full progress note dated 10/25/2020  09/30/2020 --palliative care and hospice consult appreciated -Family has decided to transition to full comfort care  Roxan Hockey, MD

## 2020-10-27 NOTE — H&P (Signed)
See H&P from same date of service

## 2020-10-28 ENCOUNTER — Other Ambulatory Visit: Payer: Self-pay | Admitting: Family Medicine

## 2020-10-28 DIAGNOSIS — J441 Chronic obstructive pulmonary disease with (acute) exacerbation: Secondary | ICD-10-CM

## 2020-10-28 DIAGNOSIS — M810 Age-related osteoporosis without current pathological fracture: Secondary | ICD-10-CM

## 2020-10-28 NOTE — Progress Notes (Signed)
Became very restless and moanful after bath and bed change.  Had been quite all day.  Received prn meds

## 2020-10-28 NOTE — Progress Notes (Signed)
Patient Demographics:    Sonya Oliver, is a 80 y.o. female, DOB - 02/14/1941, ZCH:[id number]  Admit date - 10/25/2020   Admitting Physician Cayman Brogden Denton Brick, MD  Outpatient Primary MD for the patient is Loman Brooklyn, FNP  LOS - 1   No chief complaint on file.       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 /significant memory and cognitive deficits/end-stage   Palliative care encounter   Recurrent falls   COPD (chronic obstructive pulmonary disease) (HCC)   Essential hypertension   Diabetes mellitus, type 2 (HCC)   Chronic diastolic CHF (congestive heart failure) (HCC)   Chronic respiratory failure with hypoxia ([place])   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, diabetes mellitus, diastolic CHF, and prior Covid pneumonia infection admitted on 10/22/2020 after a fall with worsening confusion and behavior problems- -patient has developed recurrent hypoglycemic episodes due to inability to have oral intake in the setting of progressive dementia with increased confusion and behavioral disturbance --Patient has been requiring IV dextrose to avoid significant hypoglycemia -Given very poor oral intake with recurrent hypoglycemic episodes expected life expectancy is less than 6 weeks -unable to transfer to residential hospice house at this time due to Covid positive status (patient has had COVID for at least 2 months now) ---- GIP status for now --Anticipate in-hospital death  A/p 1) advanced dementia with significant cognitive and memory deficits and behavioral disturbance--- patient's dementia has progressed to the point  where she is no longer able to eat -Given very poor oral intake with recurrent hypoglycemic episodes expected life expectancy is less than 6 weeks --palliative care and hospice consultappreciated -Family has decided to transition to full comfort care -However patient COVID-19 test came back positive, patient has a history of recent Covid infection back in December 2021 patient needs to stay here under GIP/hospice service  2)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 Melvyn Novas pulmonologist, recommends outpatient PET scan and further work-up after discussing goals of care with family members -Discussed with patient's daughter [name] [facility], also discussed with patient's son Iona Beard and patient's daughter-in-law Yolonda Kida- --the family Does Not want biopsy/chemo or other invasive/aggressive treatment -At this time family does not desire to pursue aggressive work-up --palliative care and hospice consultappreciated -Family has decided to transition to full comfort care  3)Social/Ethics--HCPOA is [name], but patient stays with her son Mr. Legrand Como his wife Ileana Roup  -Sadlypatient continues to decline, very poor oral intake, -Episodes of lethargy alternating with episodes of severe agitation and confusion -Not a candidate for PEG tube given very advanced dementia with significant cognitive and memory deficits and underlying left lung masspresumed to be malignant --Discussed goals of care and possible palliative/hospice involvement with patient's daughter [name] and patient's son Iona Beard and daughter-in-lawToni ---Theyrequested DNR status --Palliative care and Hospice consultappreciated -Family has decided toTransition to full comfort care   --Please see full progress note dated 10/25/2020   Disposition--- unable to transfer to residential  hospice house at this time due to Covid positive status  Status is: Inpatient  Remains  inpatient appropriate because:Please see above -unable to transfer to residential hospice house at this time due to Covid positive status  Dispo: The patient is from: Home  Anticipated d/c is to: GIP status  Anticipated d/c date is: 2 days  Patient currently is not medically stable to d/c. Barriers: Not Clinically Stable- --Covid positive status -Anticipate in-hospital death  Code Status : -  Code Status: DNR   Family Communication:  Discussed with daughter [name]  Consults  : Specialist/palliative care  DVT Prophylaxis  :   -Comfort care    Lab Results  Component Value Date   PLT 181 10/09/2020    Inpatient Medications  Scheduled Meds: . sodium chloride flush  3 mL Intravenous Q12H   Continuous Infusions: . sodium chloride     PRN Meds:.sodium chloride, acetaminophen **OR** acetaminophen, albuterol, antiseptic oral rinse, bisacodyl, diphenhydrAMINE, fentaNYL (SUBLIMAZE) injection, glycopyrrolate **OR** glycopyrrolate **OR** glycopyrrolate, LORazepam **OR** LORazepam **OR** LORazepam, morphine CONCENTRATE **OR** morphine CONCENTRATE, ondansetron **OR** ondansetron (ZOFRAN) IV, polyvinyl alcohol, sodium chloride flush    Anti-infectives (From admission, onward)   None        Objective:   There were no vitals filed for this visit.  Wt Readings from Last 3 Encounters:  10/26/20 74.3 kg  09/22/20 74.9 kg  09/18/20 76 kg     Intake/Output Summary (Last 24 hours) at 10/28/2020 1102 Last data filed at 10/28/2020 0900 Gross per 24 hour  Intake --  Output 700 ml  Net -700 ml    Physical Exam  Gen:-Appears comfortable HEENT:- Tiger.AT, No sclera icterus Neck-Supple Neck,No JVD,.  Lungs-mostly clear, no wheezing  CV- S1, S2 normal, regular  Abd-  +ve B.Sounds, Abd Soft, No tenderness,    Extremity/Skin:- No  edema, pedal pulses present  Psych-more sleepy  neuro-no new focal deficits, no tremors   Data Review:   Micro  Results Recent Results (from the past 240 hour(s))  Urine Culture     Status: None   Collection Time: 10/22/20 12:09 PM   Specimen: Urine, Clean Catch  Result Value Ref Range Status   Specimen Description   Final    URINE, CLEAN CATCH Performed at [facility], [address] [place], [place] [address]    Special Requests   Final    NONE Performed at [facility], [address] [place], [place] [address]    Culture   Final    NO GROWTH Performed at [facility], [place] [address], [place],  [address]    Report Status 10/23/2020 FINAL  Final  Resp Panel by RT-PCR (Flu A&B, Covid) Nasopharyngeal Swab     Status: None   Collection Time: 10/22/20  5:32 PM   Specimen: Nasopharyngeal Swab; Nasopharyngeal(NP) swabs in vial transport medium  Result Value Ref Range Status   SARS Coronavirus 2 by RT PCR NEGATIVE NEGATIVE Final    Comment: (NOTE) SARS-CoV-2 target nucleic acids are NOT DETECTED.  The SARS-CoV-2 RNA is generally detectable in upper respiratory specimens during the acute phase of infection. The lowest concentration of SARS-CoV-2 viral copies this assay can detect is 138 copies/mL. A negative result does not preclude SARS-Cov-2 infection and should not be used as the sole basis for treatment or other patient management decisions. A negative result may occur with  improper specimen collection/handling, submission of specimen other than nasopharyngeal swab, presence of viral mutation(s) within the areas targeted by this assay,  and inadequate number of viral copies(<138 copies/mL). A negative result must be combined with clinical observations, patient history, and epidemiological information. The expected result is Negative.  Fact Sheet for Patients:  EntrepreneurPulse.com.au  Fact Sheet for Healthcare Providers:  IncredibleEmployment.be  This test is no t yet approved or cleared by the [place] FDA and  has been  authorized for detection and/or diagnosis of SARS-CoV-2 by FDA under an Emergency Use Authorization (EUA). This EUA will remain  in effect (meaning this test can be used) for the duration of the COVID-19 declaration under Section 564(b)(1) of the Act, 21 U.S.C.section 360bbb-3(b)(1), unless the authorization is terminated  or revoked sooner.       Influenza A by PCR NEGATIVE NEGATIVE Final   Influenza B by PCR NEGATIVE NEGATIVE Final    Comment: (NOTE) The Xpert Xpress SARS-CoV-2/FLU/RSV plus assay is intended as an aid in the diagnosis of influenza from Nasopharyngeal swab specimens and should not be used as a sole basis for treatment. Nasal washings and aspirates are unacceptable for Xpert Xpress SARS-CoV-2/FLU/RSV testing.  Fact Sheet for Patients: EntrepreneurPulse.com.au  Fact Sheet for Healthcare Providers: IncredibleEmployment.be  This test is not yet approved or cleared by the [place] FDA and has been authorized for detection and/or diagnosis of SARS-CoV-2 by FDA under an Emergency Use Authorization (EUA). This EUA will remain in effect (meaning this test can be used) for the duration of the COVID-19 declaration under Section 564(b)(1) of the Act, 21 U.S.C. section 360bbb-3(b)(1), unless the authorization is terminated or revoked.  Performed at [facility], [address], [place], [place] [address]   Resp Panel by RT-PCR (Flu A&B, Covid) Nasopharyngeal Swab     Status: Abnormal   Collection Time: 10/26/20  3:21 PM   Specimen: Nasopharyngeal Swab; Nasopharyngeal(NP) swabs in vial transport medium  Result Value Ref Range Status   SARS Coronavirus 2 by RT PCR POSITIVE (A) NEGATIVE Final    Comment: RESULT CALLED TO, READ BACK BY AND VERIFIED WITH: BENGTSON,P @ 1805 ON 10/26/20 BY JUW (NOTE) SARS-CoV-2 target nucleic acids are DETECTED.  The SARS-CoV-2 RNA is generally detectable in upper respiratory specimens during the acute  phase of infection. Positive results are indicative of the presence of the identified virus, but do not rule out bacterial infection or co-infection with other pathogens not detected by the test. Clinical correlation with patient history and other diagnostic information is necessary to determine patient infection status. The expected result is Negative.  Fact Sheet for Patients: EntrepreneurPulse.com.au  Fact Sheet for Healthcare Providers: IncredibleEmployment.be  This test is not yet approved or cleared by the [place] FDA and  has been authorized for detection and/or diagnosis of SARS-CoV-2 by FDA under an Emergency Use Authorization (EUA).  This EUA will remain in effect (meaning this test can  be used) for the duration of  the COVID-19 declaration under Section 564(b)(1) of the Act, 21 U.S.C. section 360bbb-3(b)(1), unless the authorization is terminated or revoked sooner.     Influenza A by PCR NEGATIVE NEGATIVE Final   Influenza B by PCR NEGATIVE NEGATIVE Final    Comment: (NOTE) The Xpert Xpress SARS-CoV-2/FLU/RSV plus assay is intended as an aid in the diagnosis of influenza from Nasopharyngeal swab specimens and should not be used as a sole basis for treatment. Nasal washings and aspirates are unacceptable for Xpert Xpress SARS-CoV-2/FLU/RSV testing.  Fact Sheet for Patients: EntrepreneurPulse.com.au  Fact Sheet for Healthcare Providers: IncredibleEmployment.be  This test is not yet approved or cleared by  the [employer] and has been authorized for detection and/or diagnosis of SARS-CoV-2 by FDA under an Emergency Use Authorization (EUA). This EUA will remain in effect (meaning this test can be used) for the duration of the COVID-19 declaration under Section 564(b)(1) of the Act, 21 U.S.C. section 360bbb-3(b)(1), unless the authorization is terminated or revoked.  Performed at [facility], [address], [place],  [address]     Radiology Reports DG Knee 1-2 Views Left  Result Date: 10/22/2020 CLINICAL DATA:  Fall. Lower leg edema and pain to left ankle and left knee. EXAM: LEFT KNEE - 1-2 VIEW COMPARISON:  11/22/2018 FINDINGS: No joint effusion identified. Moderate to marked medial compartment joint space narrowing with exuberant marginal spur formation. Mild joint space narrowing and marginal spur formation is noted involving the patellofemoral and lateral compartment. No fracture. Diffuse soft tissue swelling is noted about the knee. IMPRESSION: 1. No acute bone abnormality. 2. Tricompartmental osteoarthritis. 3. Soft tissue swelling. Electronically Signed   By: Kerby Moors M.D.   On: 10/22/2020 20:49   DG Ankle 2 Views Left  Result Date: 10/22/2020 CLINICAL DATA:  [place], lower leg edema, left ankle pain, osteoporosis EXAM: LEFT ANKLE - 2 VIEW COMPARISON:  None. FINDINGS: Frontal and cross-table lateral views of the left ankle are obtained. The bones are diffusely osteopenic. No acute displaced fractures. Alignment is anatomic. Mild tibiotalar osteoarthritis. Prominent inferior calcaneal spur. Diffuse soft tissue edema. IMPRESSION: 1. No acute displaced fracture. 2. Diffuse osteopenia. 3. Diffuse subcutaneous edema. Electronically Signed   By: Randa Ngo M.D.   On: 10/22/2020 20:50   CT Head Wo Contrast  Result Date: 10/22/2020 CLINICAL DATA:  Head trauma, minor. Poly trauma, critical, head/cervical spine injury suspected. EXAM: CT HEAD WITHOUT CONTRAST CT CERVICAL SPINE WITHOUT CONTRAST TECHNIQUE: Multidetector CT imaging of the head and cervical spine was performed following the standard protocol without intravenous contrast. Multiplanar CT image reconstructions of the cervical spine were also generated. COMPARISON:  Head CT 08/15/2020. FINDINGS: CT HEAD FINDINGS Brain: Mildly motion degraded exam. Stable cerebral and cerebellar atrophy. Moderate ill-defined  hypoattenuation within the cerebral white matter is nonspecific, but compatible with chronic small vessel ischemic disease. There is no acute intracranial hemorrhage. No demarcated cortical infarct. No extra-axial fluid collection. No evidence of intracranial mass. No midline shift. Vascular: No hyperdense vessel.  Atherosclerotic calcifications. Skull: Normal. Negative for fracture or focal lesion. Sinuses/Orbits: Visualized orbits show no acute finding. No significant paranasal sinus disease at the imaged levels. CT CERVICAL SPINE FINDINGS Alignment: Straightening of the expected cervical lordosis. No significant spondylolisthesis. Skull base and vertebrae: The basion-dental and atlanto-dental intervals are maintained.No evidence of acute fracture to the cervical spine. Soft tissues and spinal canal: No prevertebral fluid or swelling. No visible canal hematoma. Disc levels: Cervical spondylosis with multilevel disc space narrowing, disc bulges, posterior disc osteophytes, uncovertebral hypertrophy and facet arthrosis. Disc space narrowing is severe at C3-C4, C4-C5 and moderate/severe at C5-C6. Disc bulges and posterior disc osteophytes contribute to suspected severe spinal canal stenosis at C3-C4, C4-C5 and C5-C6. Multilevel bony neural foraminal narrowing. Upper chest: Emphysema. No consolidation within the imaged lung apices. No visible pneumothorax. Other: Motion artifact limits evaluation of the thyroid gland. Thyromegaly with right thyroid lobe calcifications. IMPRESSION: CT head: 1. Mildly motion degraded exam. 2. No evidence of acute intracranial abnormality. 3. Stable generalized atrophy of the brain and chronic small vessel ischemic disease. CT cervical spine: 1. Mildly motion degraded exam. 2. No evidence of acute fracture  to the cervical spine. 3. Nonspecific straightening of the expected cervical lordosis. 4. Advanced cervical spondylosis as described. Suspected severe spinal canal stenosis at C3-C4,  C4-C5 and C5-C6. 5. Thyromegaly with right thyroid lobe calcifications. Please refer to prior thyroid ultrasound 03/13/2020 for further description of the gland. 6.  Emphysema (ICD10-J43.9). Electronically Signed   By: Kellie Simmering DO   On: 10/22/2020 12:25   CT Chest W Contrast  Result Date: 10/22/2020 CLINICAL DATA:  Mass seen on previous CT EXAM: CT CHEST WITH CONTRAST TECHNIQUE: Multidetector CT imaging of the chest was performed during intravenous contrast administration. CONTRAST:  76mL OMNIPAQUE IOHEXOL 300 MG/ML  SOLN COMPARISON:  Chest CT examinations December 14, 2015 and August 26, 2020; chest radiograph October 22, 2020 FINDINGS: Cardiovascular: The ascending thoracic aortic diameter measures 4.0 x 4.0 cm. No evident thoracic aortic dissection. There is extensive aortic atherosclerosis throughout multiple visualized great vessels. There is aortic atherosclerosis. There are multiple foci of coronary artery calcification. There is cardiomegaly. No pericardial effusion or pericardial thickening. No major vessel pulmonary embolus evident. Note that the timing bolus was not triggered to optimize pulmonary arterial vascular visualization. There is prominence of the main pulmonary outflow tract measuring 3.6 cm in diameter. Mediastinum/Nodes: The thyroid is again noted to be diffusely enlarged. There is extensive calcification in the enlarged right lobe with a mass occupying much of the right lobe, unchanged from the 2017 study. Thyroid appears stable for essentially a 5 year time span which is indicative of benign etiology and does not warrant additional imaging surveillance unless clinical symptoms so warrant. No evident thoracic adenopathy.  No esophageal lesions are evident. Lungs/Pleura: There is an irregular mass in the superior segment of the left lower lobe, similar in appearance compared to prior CT examination approximately 2 months prior. This mass measures 3.3 x 2.7 cm. There are areas of  decreased attenuation centrally, likely representing a degree of central necrosis within this apparent neoplasm. There is atelectatic change in the lung bases, stable. There is underlying centrilobular and paraseptal emphysematous change. No pleural effusions. No pneumothorax. Trachea and major bronchial structures appear normal. Upper Abdomen: There is upper abdominal aortic atherosclerosis. There is hepatic steatosis. Left adrenal hypertrophy is incompletely visualized, stable in appearance. Musculoskeletal: There are foci of degenerative change in the thoracic spine. No blastic or lytic bone lesions. No chest wall lesions. IMPRESSION: 1. Slightly irregular mass in the superior segment of the left lower lobe with apparent focus of central necrosis. Appearance consistent with neoplastic lesion. Consultation with pulmonary medicine or thoracic surgery advised. Tissue sampling may well be warranted given the appearance of this lesion. 2. Atelectatic change in the lung bases, more severe on the right than on the left. Underlying emphysematous changes. 3.  No appreciable adenopathy. 4. Ascending thoracic aorta measures 4.0 x 4.0 cm in diameter. Recommend annual imaging followup by CTA or MRA. This recommendation follows 2010 ACCF/AHA/AATS/ACR/ASA/SCA/SCAI/SIR/STS/SVM Guidelines for the Diagnosis and Management of Patients with Thoracic Aortic Disease. Circulation. 2010; 121: E315-Q008. Aortic aneurysm NOS (ICD10-I71.9). No dissection evident. Aortic atherosclerosis noted. There are foci of calcification great vessels and coronary arteries. 5. Stable enlarged thyroid with masses, primarily in the right lobe. Stability has been documented for essentially 5 year time span. Additional imaging surveillance not warranted per consensus guidelines. 6.  Hepatic steatosis. Aortic Atherosclerosis (ICD10-I70.0) and Emphysema (ICD10-J43.9). Electronically Signed   By: Lowella Grip III M.D.   On: 10/22/2020 14:31   CT  Cervical Spine Wo Contrast  Result Date:  10/22/2020 CLINICAL DATA:  Head trauma, minor. Poly trauma, critical, head/cervical spine injury suspected. EXAM: CT HEAD WITHOUT CONTRAST CT CERVICAL SPINE WITHOUT CONTRAST TECHNIQUE: Multidetector CT imaging of the head and cervical spine was performed following the standard protocol without intravenous contrast. Multiplanar CT image reconstructions of the cervical spine were also generated. COMPARISON:  Head CT 08/15/2020. FINDINGS: CT HEAD FINDINGS Brain: Mildly motion degraded exam. Stable cerebral and cerebellar atrophy. Moderate ill-defined hypoattenuation within the cerebral white matter is nonspecific, but compatible with chronic small vessel ischemic disease. There is no acute intracranial hemorrhage. No demarcated cortical infarct. No extra-axial fluid collection. No evidence of intracranial mass. No midline shift. Vascular: No hyperdense vessel.  Atherosclerotic calcifications. Skull: Normal. Negative for fracture or focal lesion. Sinuses/Orbits: Visualized orbits show no acute finding. No significant paranasal sinus disease at the imaged levels. CT CERVICAL SPINE FINDINGS Alignment: Straightening of the expected cervical lordosis. No significant spondylolisthesis. Skull base and vertebrae: The basion-dental and atlanto-dental intervals are maintained.No evidence of acute fracture to the cervical spine. Soft tissues and spinal canal: No prevertebral fluid or swelling. No visible canal hematoma. Disc levels: Cervical spondylosis with multilevel disc space narrowing, disc bulges, posterior disc osteophytes, uncovertebral hypertrophy and facet arthrosis. Disc space narrowing is severe at C3-C4, C4-C5 and moderate/severe at C5-C6. Disc bulges and posterior disc osteophytes contribute to suspected severe spinal canal stenosis at C3-C4, C4-C5 and C5-C6. Multilevel bony neural foraminal narrowing. Upper chest: Emphysema. No consolidation within the imaged lung apices.  No visible pneumothorax. Other: Motion artifact limits evaluation of the thyroid gland. Thyromegaly with right thyroid lobe calcifications. IMPRESSION: CT head: 1. Mildly motion degraded exam. 2. No evidence of acute intracranial abnormality. 3. Stable generalized atrophy of the brain and chronic small vessel ischemic disease. CT cervical spine: 1. Mildly motion degraded exam. 2. No evidence of acute fracture to the cervical spine. 3. Nonspecific straightening of the expected cervical lordosis. 4. Advanced cervical spondylosis as described. Suspected severe spinal canal stenosis at C3-C4, C4-C5 and C5-C6. 5. Thyromegaly with right thyroid lobe calcifications. Please refer to prior thyroid ultrasound 03/13/2020 for further description of the gland. 6.  Emphysema (ICD10-J43.9). Electronically Signed   By: Kellie Simmering DO   On: 10/22/2020 12:25   CT Hip Left Wo Contrast  Result Date: 10/22/2020 CLINICAL DATA:  Left hip pain since a fall 10/16/2020. Initial encounter. EXAM: CT OF THE LEFT HIP WITHOUT CONTRAST TECHNIQUE: Multidetector CT imaging of the left hip was performed according to the standard protocol. Multiplanar CT image reconstructions were also generated. COMPARISON:  Plain films left hip today. FINDINGS: Bones/Joint/Cartilage There is no fracture, dislocation or other acute bony or joint abnormality. Mild degenerative change at the symphysis pubis, left hip and left SI joint noted. No avascular necrosis of the left femoral head. Joint space is preserved. Mild chondrocalcinosis noted. Ligaments Suboptimally assessed by CT. Muscles and Tendons Intact. Soft tissues No acute or focal abnormality. Infiltration of subcutaneous fat lateral to the left hip could be due to contusion. IMPRESSION: No acute bony or joint abnormality. Infiltration of subcutaneous fat lateral to the left hip may be due to soft tissue contusion. Electronically Signed   By: Inge Rise M.D.   On: 10/22/2020 13:58   DG Chest Port  1 View  Result Date: 10/22/2020 CLINICAL DATA:  Pain following fall EXAM: PORTABLE CHEST 1 VIEW COMPARISON:  Chest radiograph and chest CT August 26, 2020 FINDINGS: Underlying emphysematous changes again noted. There is mild right base atelectasis. The left lower  lobe mass seen on most recent CT is not appreciable by radiography. There is cardiomegaly with pulmonary vascularity within normal limits. No evident adenopathy. There is aortic atherosclerosis. Bones are osteoporotic. IMPRESSION: 1. The mass in the left lower lobe seen on prior CT is not appreciable on current radiographic examination. Given concern for potential neoplasm based on the appearance of this mass on prior CT, follow-up chest CT, ideally with intravenous contrast, at this time is advised to further evaluate. 2. No edema or airspace opacity is evident. There is underlying emphysematous change. There is mild right base atelectasis. 3.  Stable cardiomegaly. 4.  Aortic Atherosclerosis (ICD10-I70.0). Electronically Signed   By: Lowella Grip III M.D.   On: 10/22/2020 11:15   DG HIPS BILAT WITH PELVIS MIN 5 VIEWS  Result Date: 10/22/2020 CLINICAL DATA:  Fall, left hip pain EXAM: DG HIP (WITH OR WITHOUT PELVIS) 5+V BILAT COMPARISON:  09/04/2020, 08/15/2020 FINDINGS: Osseous structures are diffusely demineralized. Possible cortical irregularity in the subcapital region of the proximal left femur, not well characterized given lack of frog-lateral view. Cross-table lateral view is limited by overlapping structures. Right hip joint appears intact without fracture or dislocation. Sacrum is largely obscured by overlying stool and bowel gas. No evidence of pelvic diastasis. IMPRESSION: Possible cortical irregularity in the subcapital region of the proximal left femur, not well characterized. A CT scan of the left hip is recommended for further evaluation. Electronically Signed   By: Davina Poke D.O.   On: 10/22/2020 12:03     CBC Recent  Labs  Lab 10/22/20 1032 10/25/20 0608 10/11/2020 0617  WBC 5.5 4.8 5.1  HGB 11.1* 12.7 11.4*  HCT 37.4 43.8 39.5  PLT 157 177 181  MCV 87.8 89.4 90.0  MCH 26.1 25.9* 26.0  MCHC 29.7* 29.0* 28.9*  RDW 16.2* 16.0* 15.8*  LYMPHSABS 0.7  --   --   MONOABS 0.5  --   --   EOSABS 0.0  --   --   BASOSABS 0.0  --   --     Chemistries  Recent Labs  Lab 10/22/20 1032 10/23/20 0423 10/25/20 0608 10/16/2020 0617  NA 144 145 142 142  K 3.3* 2.7* 4.2 4.2  CL 102 99 98 100  CO2 28 32 30 34*  GLUCOSE 81 79 122* 134*  BUN 15 13 16 14   CREATININE 0.76 0.73 0.60 0.63  CALCIUM 9.0 8.8* 9.4 9.1  MG  --  1.6* 2.3  --   AST 24  --   --   --   ALT 14  --   --   --   ALKPHOS 48  --   --   --   BILITOT 1.2  --   --   --    ------------------------------------------------------------------------------------------------------------------ No results for input(s): CHOL, HDL, LDLCALC, TRIG, CHOLHDL, LDLDIRECT in the last 72 hours.  Lab Results  Component Value Date   HGBA1C 5.8 (H) 08/29/2020   ------------------------------------------------------------------------------------------------------------------ No results for input(s): TSH, T4TOTAL, T3FREE, THYROIDAB in the last 72 hours.  Invalid input(s): FREET3 ------------------------------------------------------------------------------------------------------------------ No results for input(s): VITAMINB12, FOLATE, FERRITIN, TIBC, IRON, RETICCTPCT in the last 72 hours.  Coagulation profile No results for input(s): INR, PROTIME in the last 168 hours.  No results for input(s): DDIMER in the last 72 hours.  Cardiac Enzymes No results for input(s): CKMB, TROPONINI, MYOGLOBIN in the last 168 hours.  Invalid input(s): CK ------------------------------------------------------------------------------------------------------------------    Component Value Date/Time   BNP 207.0 (H) 10/22/2020 1033  BNP 112.8 (H) 03/22/2013 1707    -unable  to transfer to residential hospice house at this time due to Covid positive status (patient has had COVID for at least 2 months now) --- GIP status for now  Roxan Hockey M.D on 10/28/2020 at 11:02 AM  Go to [email or web address] - for contact info  [facility] - Office  [phone]

## 2020-10-28 NOTE — Progress Notes (Signed)
Received referral from Dr. Joesph Fillers for spiritual support at EOL. Found Sonya Oliver, operational in hospital bed with eyes closed and resting comfortably. Provided prayer bedside and checked in with unit nurse Deanna this morning. No family bedside and was unable to reach by phone. Chaplain will remain available as needed or requested in order to provide spiritual support and to assess for spiritual need.

## 2020-10-28 DEATH — deceased

## 2020-10-29 ENCOUNTER — Telehealth: Payer: Self-pay | Admitting: "Endocrinology

## 2020-10-29 DIAGNOSIS — J9611 Chronic respiratory failure with hypoxia: Secondary | ICD-10-CM

## 2020-10-29 DIAGNOSIS — F0280 Dementia in other diseases classified elsewhere without behavioral disturbance: Secondary | ICD-10-CM

## 2020-10-29 DIAGNOSIS — Z8616 Personal history of COVID-19: Secondary | ICD-10-CM

## 2020-10-29 DIAGNOSIS — R296 Repeated falls: Secondary | ICD-10-CM

## 2020-10-29 DIAGNOSIS — J449 Chronic obstructive pulmonary disease, unspecified: Secondary | ICD-10-CM

## 2020-10-29 DIAGNOSIS — N390 Urinary tract infection, site not specified: Secondary | ICD-10-CM

## 2020-10-29 DIAGNOSIS — I5032 Chronic diastolic (congestive) heart failure: Secondary | ICD-10-CM

## 2020-10-29 DIAGNOSIS — I10 Essential (primary) hypertension: Secondary | ICD-10-CM

## 2020-10-29 DIAGNOSIS — Z515 Encounter for palliative care: Principal | ICD-10-CM

## 2020-10-29 DIAGNOSIS — G309 Alzheimer's disease, unspecified: Secondary | ICD-10-CM

## 2020-10-31 ENCOUNTER — Ambulatory Visit: Payer: Medicare Other | Admitting: Family Medicine

## 2020-11-05 ENCOUNTER — Ambulatory Visit: Payer: Medicare Other

## 2020-11-24 ENCOUNTER — Ambulatory Visit: Payer: Medicare Other | Admitting: Nurse Practitioner

## 2020-11-28 NOTE — Progress Notes (Signed)
This nurse was called into the room by Hospice nurse to report pt was deceased. This nurse along with charge nurse Velta Addison went to verify. No heartbeat and no respirations noted via checking apical pulse. MD notified and pronounced TOD at 0815 am. Family notified-Nancy Hassell Done of her mothers passing. Family will be coming bedside. Donor services contacted and no services can be accepted at this time.

## 2020-11-28 NOTE — Death Summary Note (Signed)
DEATH SUMMARY   Patient Details  Name: Sonya Oliver MRN: [id number] DOB: 08/06/1941  Admission/Discharge Information   Admit Date:  16-Nov-2020  Date of Death: Date of Death: 2020-11-18  Time of Death: Time of Death: 0815  Length of Stay: 2  Referring Physician: Loman Brooklyn, FNP   Reason(s) for Hospitalization  Mechanical fall, UTI and metabolic encephalopathy (patient with underlying advanced dementia). HPI: as per H&P written by Dr. Denton Brick on 10/22/20 Sonya Oliver is a 80 y.o. female with medical history significant for COPD, diabetes mellitus, diastolic CHF, [id number] pneumonia. Patient was brought to the ED via EMS with reports of weakness and leg swelling, patient also was not talking or feeding herself.  Patient had a fall 2 days ago, but patient declined coming to the ED.  At the time of my evaluation patient is awake, history is limited, patient answers some questions accurately but at times her speech is tangential/confused. She reports she fell on her left side, and tells me she is having severe pain on her left knee and left ankle.  She denies chest pain, denies difficulty breathing. I talked to patient's daughter Sonya Oliver, and reports she gives me conflicts with ED providers history obtained from patient's son.  Patient lives with patient's son.  But patient's daughter Sonya Oliver is designated healthcare power of attorney.  Patient's daughter tells me that patient fell 2 days ago, but since then she has been able to ambulate with a walker, she has been able to feed herself.  She reports history of memory problems and diagnosis of dementia about 2 months ago. She tells me patient speech is at times confused, saying and seeing things that are not actually there, but this is her baseline. Patient is on O2 at baseline, but patient uses it as needed sometimes in the morning and sometimes in the afternoon, and patient sometimes has low oxygen saturations at home. She reports chronic  lower extremity swelling, fluctuates in severity, she feels patient's legs are more swollen now but she is unable to tell duration.  She reports patient is noncompliant with her Lasix.  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  Patient found to have an acute urinary tract infection and was actively receiving antibiotic therapy for it.  Due to ongoing changes in her mentation she ended no eating or drinking properly with subsequent development of recurring hypoglycemic episodes for which she required IV dextrose infusion  to avoid significant hypoglycemic events.  Goals of cares and advance directive discussions with assistance of palliative care was performed and family ended deciding to transition to full comfort care.  Patient continued to actively decline  maintaining a very poor oral intake and experiencing episodes of lethargy alternating with episode of severe agitation and confusion.  Based on discussions she was not a candidate for PEG tube placement and comfort medications initiated.  Given a positive test of COVID-19 she was not able to be transferred to hospice home and instead was transitioned into GIP.  At 8:15 a.m. on 11/01/2020 patient peacefully expired.  Pertinent Labs and Studies  Significant Diagnostic Studies DG Knee 1-2 Views Left  Result Date: 10/22/2020 CLINICAL DATA:  Fall. Lower leg edema and pain to left ankle and left knee. EXAM: LEFT KNEE - 1-2 VIEW COMPARISON:  11/22/2018 FINDINGS: No joint effusion identified. Moderate to marked medial compartment joint space narrowing with exuberant marginal spur formation. Mild joint space narrowing and marginal spur formation is noted involving the patellofemoral and lateral compartment. No fracture. Diffuse soft tissue swelling is noted about the knee. IMPRESSION: 1. No acute bone abnormality. 2. Tricompartmental osteoarthritis. 3. Soft tissue swelling. Electronically Signed   By: Kerby Moors M.D.   On: 10/22/2020 20:49   DG Ankle 2 Views Left  Result Date: 10/22/2020 CLINICAL DATA:  [place], lower leg edema, left ankle pain, osteoporosis EXAM: LEFT ANKLE - 2 VIEW COMPARISON:  None. FINDINGS: Frontal and cross-table lateral views of the left ankle are obtained. The bones are diffusely osteopenic. No acute displaced fractures. Alignment is anatomic. Mild tibiotalar osteoarthritis. Prominent inferior calcaneal spur. Diffuse soft tissue edema. IMPRESSION: 1. No acute displaced fracture. 2. Diffuse osteopenia. 3. Diffuse subcutaneous edema. Electronically Signed   By: Randa Ngo M.D.   On: 10/22/2020 20:50   CT Head Wo Contrast  Result Date: 10/22/2020 CLINICAL DATA:  Head trauma, minor. Poly trauma, critical, head/cervical spine injury suspected. EXAM: CT HEAD WITHOUT CONTRAST CT CERVICAL  SPINE WITHOUT CONTRAST TECHNIQUE: Multidetector CT imaging of the head and cervical spine was performed following the standard protocol without intravenous contrast. Multiplanar CT image reconstructions of the cervical spine were also generated. COMPARISON:  Head CT 08/15/2020. FINDINGS: CT HEAD FINDINGS Brain: Mildly motion degraded exam. Stable cerebral and cerebellar atrophy. Moderate ill-defined hypoattenuation within the cerebral white matter is nonspecific, but compatible with chronic small vessel ischemic disease. There is no acute intracranial hemorrhage. No demarcated cortical infarct. No extra-axial fluid collection. No evidence of intracranial mass. No midline shift. Vascular: No hyperdense vessel.  Atherosclerotic calcifications. Skull: Normal. Negative for fracture or focal lesion. Sinuses/Orbits: Visualized orbits show no acute finding. No significant paranasal sinus disease at the imaged levels. CT CERVICAL SPINE FINDINGS Alignment: Straightening of the expected cervical lordosis. No significant spondylolisthesis. Skull base and vertebrae: The basion-dental and atlanto-dental intervals are maintained.No evidence of acute fracture to the cervical spine. Soft tissues and spinal canal: No prevertebral fluid or swelling. No visible canal hematoma. Disc levels: Cervical spondylosis with multilevel disc space narrowing, disc bulges, posterior disc osteophytes, uncovertebral hypertrophy and facet arthrosis. Disc space narrowing is severe at C3-C4, C4-C5 and moderate/severe at C5-C6. Disc bulges and posterior disc osteophytes contribute to suspected severe spinal canal stenosis at C3-C4, C4-C5 and C5-C6. Multilevel bony neural foraminal narrowing. Upper chest: Emphysema. No consolidation within the imaged lung apices. No visible pneumothorax. Other: Motion artifact limits evaluation of the thyroid gland. Thyromegaly with right thyroid lobe calcifications. IMPRESSION: CT head: 1. Mildly  motion degraded exam.  2. No evidence of acute intracranial abnormality. 3. Stable generalized atrophy of the brain and chronic small vessel ischemic disease. CT cervical spine: 1. Mildly motion degraded exam. 2. No evidence of acute fracture to the cervical spine. 3. Nonspecific straightening of the expected cervical lordosis. 4. Advanced cervical spondylosis as described. Suspected severe spinal canal stenosis at C3-C4, C4-C5 and C5-C6. 5. Thyromegaly with right thyroid lobe calcifications. Please refer to prior thyroid ultrasound 03/13/2020 for further description of the gland. 6.  Emphysema (ICD10-J43.9). Electronically Signed   By: Kellie Simmering DO   On: 10/22/2020 12:25   CT Chest W Contrast  Result Date: 10/22/2020 CLINICAL DATA:  Mass seen on previous CT EXAM: CT CHEST WITH CONTRAST TECHNIQUE: Multidetector CT imaging of the chest was performed during intravenous contrast administration. CONTRAST:  65mL OMNIPAQUE IOHEXOL 300 MG/ML  SOLN COMPARISON:  Chest CT examinations December 14, 2015 and August 26, 2020; chest radiograph October 22, 2020 FINDINGS: Cardiovascular: The ascending thoracic aortic diameter measures 4.0 x 4.0 cm. No evident thoracic aortic dissection. There is extensive aortic atherosclerosis throughout multiple visualized great vessels. There is aortic atherosclerosis. There are multiple foci of coronary artery calcification. There is cardiomegaly. No pericardial effusion or pericardial thickening. No major vessel pulmonary embolus evident. Note that the timing bolus was not triggered to optimize pulmonary arterial vascular visualization. There is prominence of the main pulmonary outflow tract measuring 3.6 cm in diameter. Mediastinum/Nodes: The thyroid is again noted to be diffusely enlarged. There is extensive calcification in the enlarged right lobe with a mass occupying much of the right lobe, unchanged from the 2017 study. Thyroid appears stable for essentially a 5 year time span which is indicative of  benign etiology and does not warrant additional imaging surveillance unless clinical symptoms so warrant. No evident thoracic adenopathy.  No esophageal lesions are evident. Lungs/Pleura: There is an irregular mass in the superior segment of the left lower lobe, similar in appearance compared to prior CT examination approximately 2 months prior. This mass measures 3.3 x 2.7 cm. There are areas of decreased attenuation centrally, likely representing a degree of central necrosis within this apparent neoplasm. There is atelectatic change in the lung bases, stable. There is underlying centrilobular and paraseptal emphysematous change. No pleural effusions. No pneumothorax. Trachea and major bronchial structures appear normal. Upper Abdomen: There is upper abdominal aortic atherosclerosis. There is hepatic steatosis. Left adrenal hypertrophy is incompletely visualized, stable in appearance. Musculoskeletal: There are foci of degenerative change in the thoracic spine. No blastic or lytic bone lesions. No chest wall lesions. IMPRESSION: 1. Slightly irregular mass in the superior segment of the left lower lobe with apparent focus of central necrosis. Appearance consistent with neoplastic lesion. Consultation with pulmonary medicine or thoracic surgery advised. Tissue sampling may well be warranted given the appearance of this lesion. 2. Atelectatic change in the lung bases, more severe on the right than on the left. Underlying emphysematous changes. 3.  No appreciable adenopathy. 4. Ascending thoracic aorta measures 4.0 x 4.0 cm in diameter. Recommend annual imaging followup by CTA or MRA. This recommendation follows 2010 ACCF/AHA/AATS/ACR/ASA/SCA/SCAI/SIR/STS/SVM Guidelines for the Diagnosis and Management of Patients with Thoracic Aortic Disease. Circulation. 2010; 121: Z610-R604. Aortic aneurysm NOS (ICD10-I71.9). No dissection evident. Aortic atherosclerosis noted. There are foci of calcification great vessels and  coronary arteries. 5. Stable enlarged thyroid with masses, primarily in the right lobe. Stability has been documented for essentially 5 year time span. Additional imaging surveillance not warranted per  consensus guidelines. 6.  Hepatic steatosis. Aortic Atherosclerosis (ICD10-I70.0) and Emphysema (ICD10-J43.9). Electronically Signed   By: Lowella Grip III M.D.   On: 10/22/2020 14:31   CT Cervical Spine Wo Contrast  Result Date: 10/22/2020 CLINICAL DATA:  Head trauma, minor. Poly trauma, critical, head/cervical spine injury suspected. EXAM: CT HEAD WITHOUT CONTRAST CT CERVICAL SPINE WITHOUT CONTRAST TECHNIQUE: Multidetector CT imaging of the head and cervical spine was performed following the standard protocol without intravenous contrast. Multiplanar CT image reconstructions of the cervical spine were also generated. COMPARISON:  Head CT 08/15/2020. FINDINGS: CT HEAD FINDINGS Brain: Mildly motion degraded exam. Stable cerebral and cerebellar atrophy. Moderate ill-defined hypoattenuation within the cerebral white matter is nonspecific, but compatible with chronic small vessel ischemic disease. There is no acute intracranial hemorrhage. No demarcated cortical infarct. No extra-axial fluid collection. No evidence of intracranial mass. No midline shift. Vascular: No hyperdense vessel.  Atherosclerotic calcifications. Skull: Normal. Negative for fracture or focal lesion. Sinuses/Orbits: Visualized orbits show no acute finding. No significant paranasal sinus disease at the imaged levels. CT CERVICAL SPINE FINDINGS Alignment: Straightening of the expected cervical lordosis. No significant spondylolisthesis. Skull base and vertebrae: The basion-dental and atlanto-dental intervals are maintained.No evidence of acute fracture to the cervical spine. Soft tissues and spinal canal: No prevertebral fluid or swelling. No visible canal hematoma. Disc levels: Cervical spondylosis with multilevel disc space narrowing, disc  bulges, posterior disc osteophytes, uncovertebral hypertrophy and facet arthrosis. Disc space narrowing is severe at C3-C4, C4-C5 and moderate/severe at C5-C6. Disc bulges and posterior disc osteophytes contribute to suspected severe spinal canal stenosis at C3-C4, C4-C5 and C5-C6. Multilevel bony neural foraminal narrowing. Upper chest: Emphysema. No consolidation within the imaged lung apices. No visible pneumothorax. Other: Motion artifact limits evaluation of the thyroid gland. Thyromegaly with right thyroid lobe calcifications. IMPRESSION: CT head: 1. Mildly motion degraded exam. 2. No evidence of acute intracranial abnormality. 3. Stable generalized atrophy of the brain and chronic small vessel ischemic disease. CT cervical spine: 1. Mildly motion degraded exam. 2. No evidence of acute fracture to the cervical spine. 3. Nonspecific straightening of the expected cervical lordosis. 4. Advanced cervical spondylosis as described. Suspected severe spinal canal stenosis at C3-C4, C4-C5 and C5-C6. 5. Thyromegaly with right thyroid lobe calcifications. Please refer to prior thyroid ultrasound 03/13/2020 for further description of the gland. 6.  Emphysema (ICD10-J43.9). Electronically Signed   By: Kellie Simmering DO   On: 10/22/2020 12:25   CT Hip Left Wo Contrast  Result Date: 10/22/2020 CLINICAL DATA:  Left hip pain since a fall 10/16/2020. Initial encounter. EXAM: CT OF THE LEFT HIP WITHOUT CONTRAST TECHNIQUE: Multidetector CT imaging of the left hip was performed according to the standard protocol. Multiplanar CT image reconstructions were also generated. COMPARISON:  Plain films left hip today. FINDINGS: Bones/Joint/Cartilage There is no fracture, dislocation or other acute bony or joint abnormality. Mild degenerative change at the symphysis pubis, left hip and left SI joint noted. No avascular necrosis of the left femoral head. Joint space is preserved. Mild chondrocalcinosis noted. Ligaments Suboptimally  assessed by CT. Muscles and Tendons Intact. Soft tissues No acute or focal abnormality. Infiltration of subcutaneous fat lateral to the left hip could be due to contusion. IMPRESSION: No acute bony or joint abnormality. Infiltration of subcutaneous fat lateral to the left hip may be due to soft tissue contusion. Electronically Signed   By: Inge Rise M.D.   On: 10/22/2020 13:58   DG Chest Port 1 View  Result Date:  10/22/2020 CLINICAL DATA:  Pain following fall EXAM: PORTABLE CHEST 1 VIEW COMPARISON:  Chest radiograph and chest CT August 26, 2020 FINDINGS: Underlying emphysematous changes again noted. There is mild right base atelectasis. The left lower lobe mass seen on most recent CT is not appreciable by radiography. There is cardiomegaly with pulmonary vascularity within normal limits. No evident adenopathy. There is aortic atherosclerosis. Bones are osteoporotic. IMPRESSION: 1. The mass in the left lower lobe seen on prior CT is not appreciable on current radiographic examination. Given concern for potential neoplasm based on the appearance of this mass on prior CT, follow-up chest CT, ideally with intravenous contrast, at this time is advised to further evaluate. 2. No edema or airspace opacity is evident. There is underlying emphysematous change. There is mild right base atelectasis. 3.  Stable cardiomegaly. 4.  Aortic Atherosclerosis (ICD10-I70.0). Electronically Signed   By: Lowella Grip III M.D.   On: 10/22/2020 11:15   DG HIPS BILAT WITH PELVIS MIN 5 VIEWS  Result Date: 10/22/2020 CLINICAL DATA:  Fall, left hip pain EXAM: DG HIP (WITH OR WITHOUT PELVIS) 5+V BILAT COMPARISON:  09/04/2020, 08/15/2020 FINDINGS: Osseous structures are diffusely demineralized. Possible cortical irregularity in the subcapital region of the proximal left femur, not well characterized given lack of frog-lateral view. Cross-table lateral view is limited by overlapping structures. Right hip joint appears intact  without fracture or dislocation. Sacrum is largely obscured by overlying stool and bowel gas. No evidence of pelvic diastasis. IMPRESSION: Possible cortical irregularity in the subcapital region of the proximal left femur, not well characterized. A CT scan of the left hip is recommended for further evaluation. Electronically Signed   By: Davina Poke D.O.   On: 10/22/2020 12:03    Microbiology Recent Results (from the past 240 hour(s))  Urine Culture     Status: None   Collection Time: 10/22/20 12:09 PM   Specimen: Urine, Clean Catch  Result Value Ref Range Status   Specimen Description   Final    URINE, CLEAN CATCH Performed at [facility], [address] [place], [place] [address]    Special Requests   Final    NONE Performed at [facility], [address] [place], [place] [address]    Culture   Final    NO GROWTH Performed at [facility], [place] [address], [place], [place] [address]    Report Status 10/23/2020 FINAL  Final  Resp Panel by RT-PCR (Flu A&B, Covid) Nasopharyngeal Swab     Status: None   Collection Time: 10/22/20  5:32 PM   Specimen: Nasopharyngeal Swab; Nasopharyngeal(NP) swabs in vial transport medium  Result Value Ref Range Status   SARS Coronavirus 2 by RT PCR NEGATIVE NEGATIVE Final    Comment: (NOTE) SARS-CoV-2 target nucleic acids are NOT DETECTED.  The SARS-CoV-2 RNA is generally detectable in upper respiratory specimens during the acute phase of infection. The lowest concentration of SARS-CoV-2 viral copies this assay can detect is 138 copies/mL. A negative result does not preclude SARS-Cov-2 infection and should not be used as the sole basis for treatment or other patient management decisions. A negative result may occur with  improper specimen collection/handling, submission of specimen other than nasopharyngeal swab, presence of viral mutation(s) within the areas targeted by this assay, and inadequate number of viral copies(<138 copies/mL).  A negative result must be combined with clinical observations, patient history, and epidemiological information. The expected result is Negative.  Fact Sheet for Patients:  EntrepreneurPulse.com.au  Fact Sheet  for Healthcare Providers:  IncredibleEmployment.be  This test is no t yet approved or cleared by the [place] and  has been authorized for detection and/or diagnosis of SARS-CoV-2 by FDA under an Emergency Use Authorization (EUA). This EUA will remain  in effect (meaning this test can be used) for the duration of the COVID-19 declaration under Section 564(b)(1) of the Act, 21 U.S.C.section 360bbb-3(b)(1), unless the authorization is terminated  or revoked sooner.       Influenza A by PCR NEGATIVE NEGATIVE Final   Influenza B by PCR NEGATIVE NEGATIVE Final    Comment: (NOTE) The Xpert Xpress SARS-CoV-2/FLU/RSV plus assay is intended as an aid in the diagnosis of influenza from Nasopharyngeal swab specimens and should not be used as a sole basis for treatment. Nasal washings and aspirates are unacceptable for Xpert Xpress SARS-CoV-2/FLU/RSV testing.  Fact Sheet for Patients: EntrepreneurPulse.com.au  Fact Sheet for Healthcare Providers: IncredibleEmployment.be  This test is not yet approved or cleared by the [place] FDA and has been authorized for detection and/or diagnosis of SARS-CoV-2 by FDA under an Emergency Use Authorization (EUA). This EUA will remain in effect (meaning this test can be used) for the duration of the COVID-19 declaration under Section 564(b)(1) of the Act, 21 U.S.C. section 360bbb-3(b)(1), unless the authorization is terminated or revoked.  Performed at [facility], [address] [place], [place] [address]   Resp Panel by RT-PCR (Flu A&B, Covid) Nasopharyngeal Swab     Status: Abnormal   Collection Time: 10/26/20  3:21 PM   Specimen: Nasopharyngeal Swab;  Nasopharyngeal(NP) swabs in vial transport medium  Result Value Ref Range Status   SARS Coronavirus 2 by RT PCR POSITIVE (A) NEGATIVE Final    Comment: RESULT CALLED TO, READ BACK BY AND VERIFIED WITH: BENGTSON,P @ 1805 ON 10/26/20 BY JUW (NOTE) SARS-CoV-2 target nucleic acids are DETECTED.  The SARS-CoV-2 RNA is generally detectable in upper respiratory specimens during the acute phase of infection. Positive results are indicative of the presence of the identified virus, but do not rule out bacterial infection or co-infection with other pathogens not detected by the test. Clinical correlation with patient history and other diagnostic information is necessary to determine patient infection status. The expected result is Negative.  Fact Sheet for Patients: EntrepreneurPulse.com.au  Fact Sheet for Healthcare Providers: IncredibleEmployment.be  This test is not yet approved or cleared by the [place] FDA and  has been authorized for detection and/or diagnosis of SARS-CoV-2 by FDA under an Emergency Use Authorization (EUA).  This EUA will remain in effect (meaning this test can  be used) for the duration of  the COVID-19 declaration under Section 564(b)(1) of the Act, 21 U.S.C. section 360bbb-3(b)(1), unless the authorization is terminated or revoked sooner.     Influenza A by PCR NEGATIVE NEGATIVE Final   Influenza B by PCR NEGATIVE NEGATIVE Final    Comment: (NOTE) The Xpert Xpress SARS-CoV-2/FLU/RSV plus assay is intended as an aid in the diagnosis of influenza from Nasopharyngeal swab specimens and should not be used as a sole basis for treatment. Nasal washings and aspirates are unacceptable for Xpert Xpress SARS-CoV-2/FLU/RSV testing.  Fact Sheet for Patients: EntrepreneurPulse.com.au  Fact Sheet for Healthcare Providers: IncredibleEmployment.be  This test is not yet approved or cleared by the  [place] FDA and has been authorized for detection and/or diagnosis of SARS-CoV-2 by FDA under an Emergency Use Authorization (EUA). This EUA will remain in effect (meaning this test can be used) for the  duration of the COVID-19 declaration under Section 564(b)(1) of the Act, 21 U.S.C. section 360bbb-3(b)(1), unless the authorization is terminated or revoked.  Performed at [facility], [address], [place], [place] [address]     Lab Basic Metabolic Panel: Recent Labs  Lab 10/22/20 1032 10/23/20 0423 10/25/20 0608 10/19/2020 0617  NA 144 145 142 142  K 3.3* 2.7* 4.2 4.2  CL 102 99 98 100  CO2 28 32 30 34*  GLUCOSE 81 79 122* 134*  BUN 15 13 16 14   CREATININE 0.76 0.73 0.60 0.63  CALCIUM 9.0 8.8* 9.4 9.1  MG  --  1.6* 2.3  --    Liver Function Tests: Recent Labs  Lab 10/22/20 1032  AST 24  ALT 14  ALKPHOS 48  BILITOT 1.2  PROT 7.4  ALBUMIN 2.9*   CBC: Recent Labs  Lab 10/22/20 1032 10/25/20 0608 10/14/2020 0617  WBC 5.5 4.8 5.1  NEUTROABS 4.2  --   --   HGB 11.1* 12.7 11.4*  HCT 37.4 43.8 39.5  MCV 87.8 89.4 90.0  PLT 157 177 181   Sepsis Labs: Recent Labs  Lab 10/22/20 1032 10/25/20 0608 10/06/2020 0617  WBC 5.5 4.8 5.1    Procedures/Operations  None.  Time: 25 minutes.   Barton Dubois November 22, 2020, 8:41 AM

## 2020-11-28 NOTE — Telephone Encounter (Signed)
Pt family called to let us know she passed away this morning.

## 2020-11-28 NOTE — Telephone Encounter (Signed)
RIP. Noted.

## 2020-11-28 NOTE — H&P (Signed)
  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, diabetes mellitus, diastolic CHF, and prior Covid pneumonia infection admitted on 10/22/2020 after a fall with worsening confusion and behavior problems- -patient has developed recurrent hypoglycemic episodes due to inability to have oral intake in the setting of progressive dementia with increased confusion and behavioral disturbance --Patient has been requiring IV dextrose to avoid significant hypoglycemia -Given very poor oral intake with recurrent hypoglycemic episodes expected life expectancy is less than 6 weeks -unable to transfer to residential hospice house at this time due to Covid positive status --Anticipate in-hospital death   -Please see prior full H&P for same date of service  Roxan Hockey, MD

## 2020-11-28 DEATH — deceased

## 2021-07-07 IMAGING — CT CT ABD-PELV W/ CM
2 of 5 series · 16 of 46 positions shown, 18 images · IV contrast (Omnipaque or Isovue)
Comparison: 02/23/2012

CLINICAL DATA: Nausea, vomiting, episodic abdominal pain

EXAM:
CT ABDOMEN AND PELVIS WITH CONTRAST
TECHNIQUE: Multidetector CT imaging of the abdomen and pelvis was performed
using the standard protocol following bolus administration of
intravenous contrast.
CONTRAST:  100mL OMNIPAQUE IOHEXOL 300 MG/ML  SOLN

[Series 3: axial st · axial · 0.62mm/px · z∈[+1116,+1511]mm · 13 of 89 slices shown, 15 images]
[im 5/89  soft-tissue]
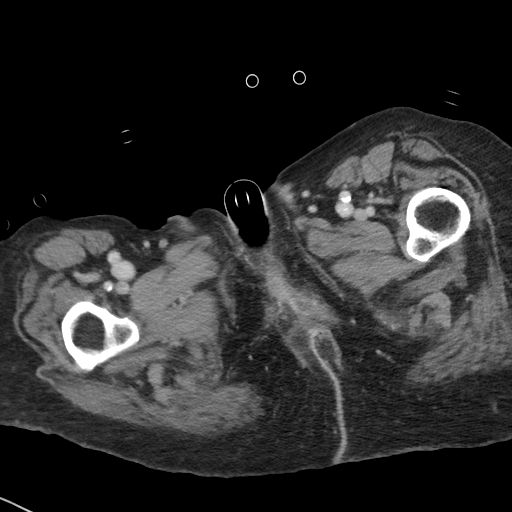
[im 5/89  bone]
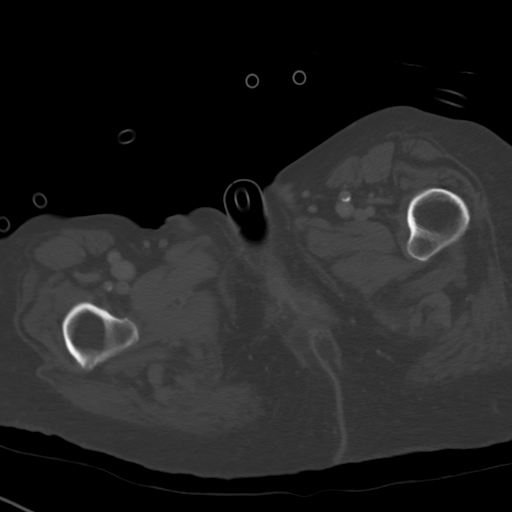
[im 10/89  soft-tissue]
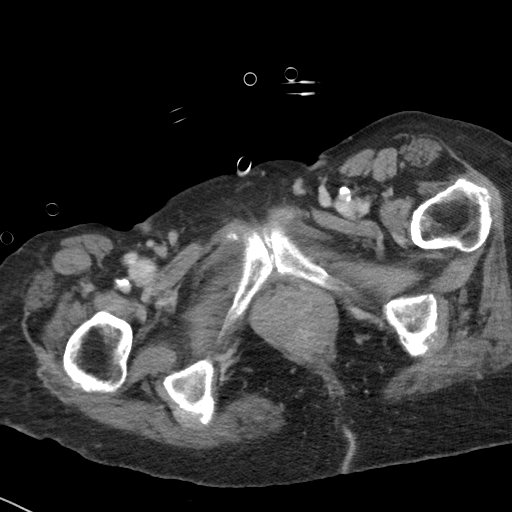
[im 20/89  soft-tissue]
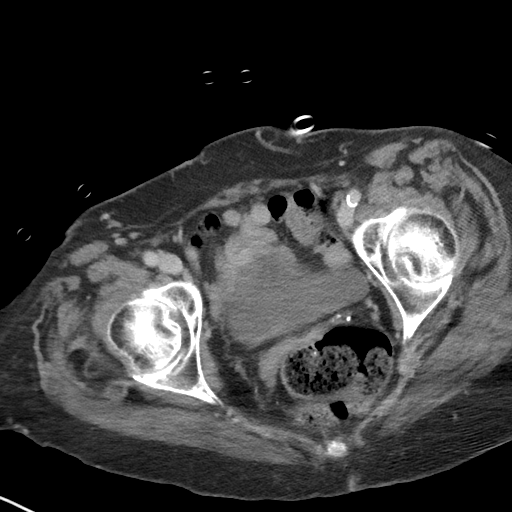
[im 25/89  soft-tissue]
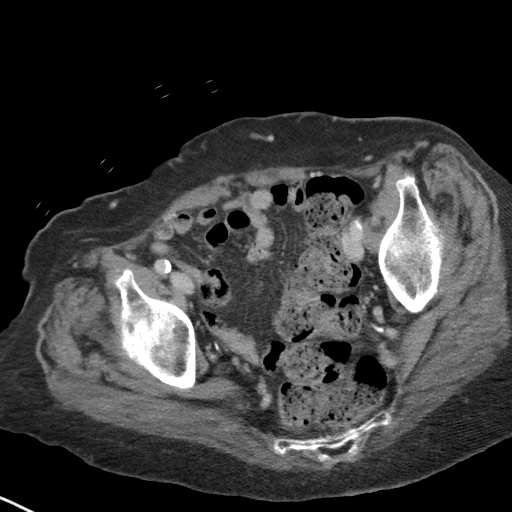
[im 30/89  soft-tissue]
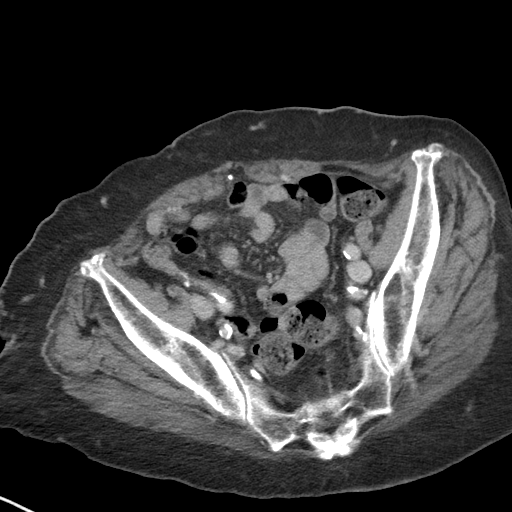
[im 40/89  soft-tissue]
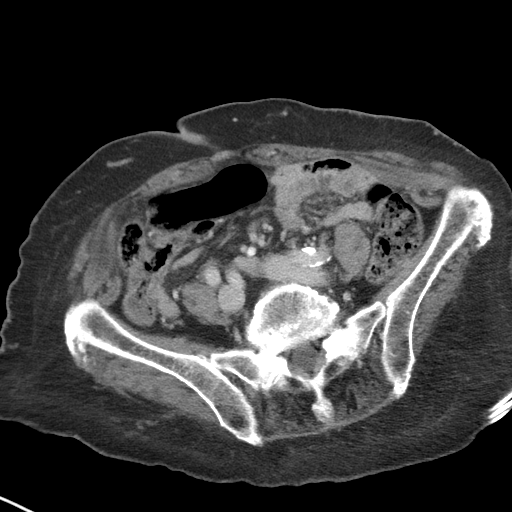
[im 45/89  soft-tissue]
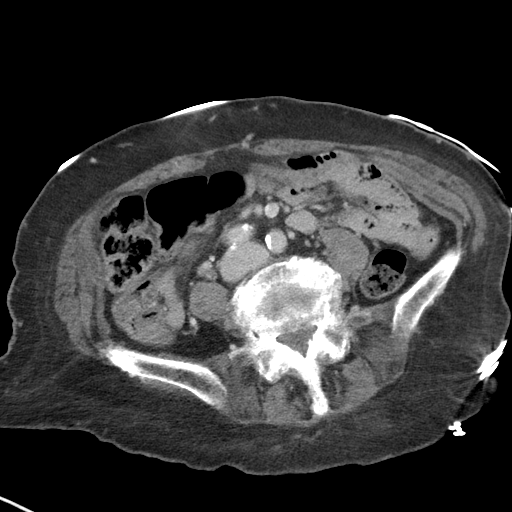
[im 49/89  soft-tissue]
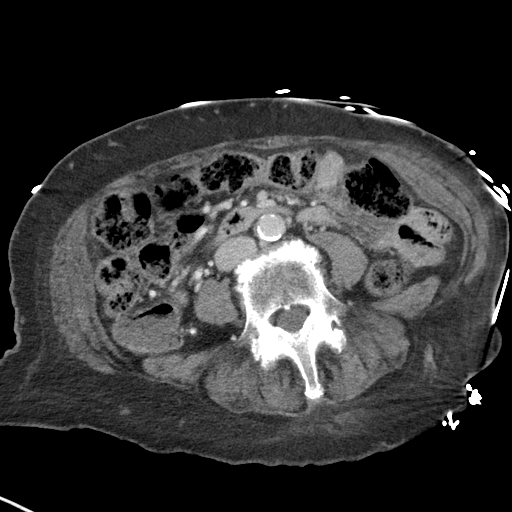
[im 59/89  soft-tissue]
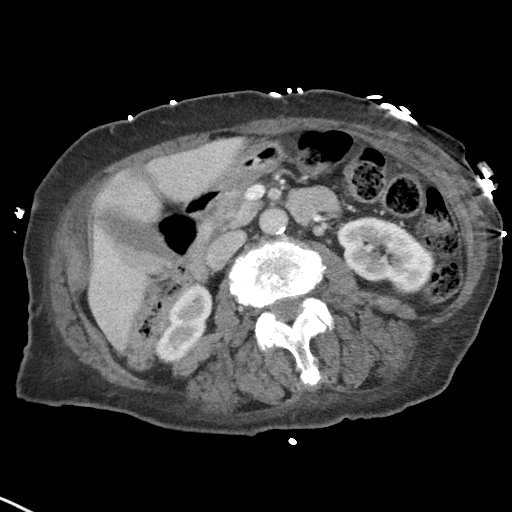
[im 59/89  bone]
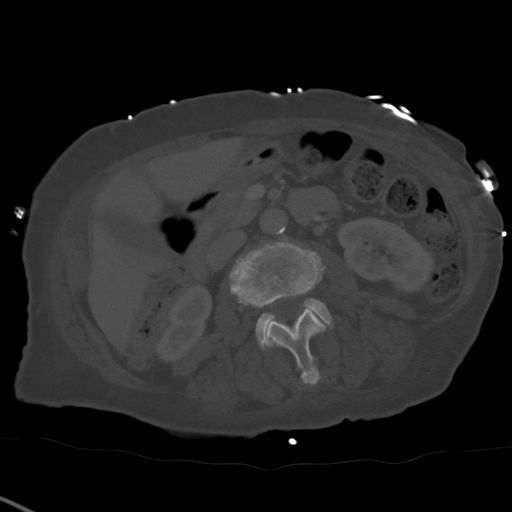
[im 64/89  soft-tissue]
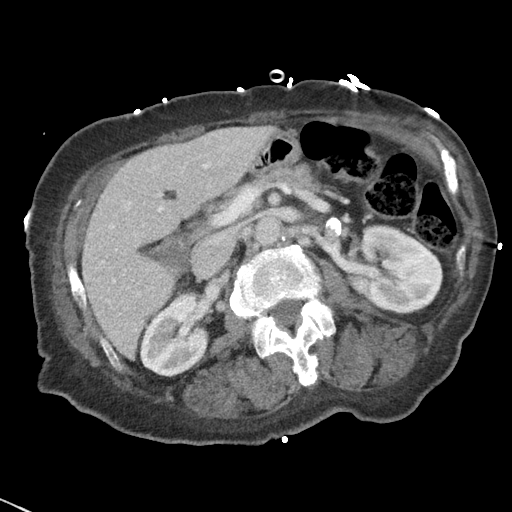
[im 69/89  soft-tissue]
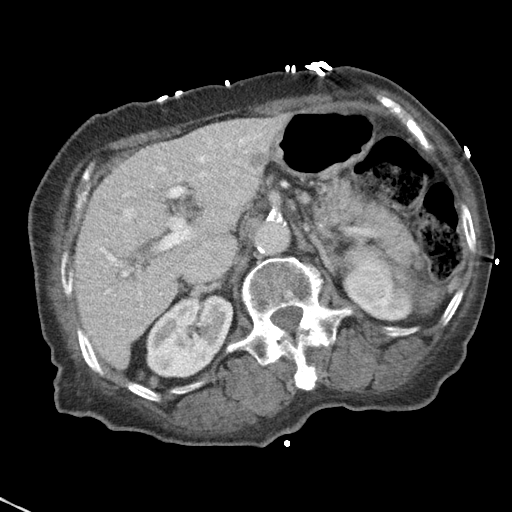
[im 79/89  soft-tissue]
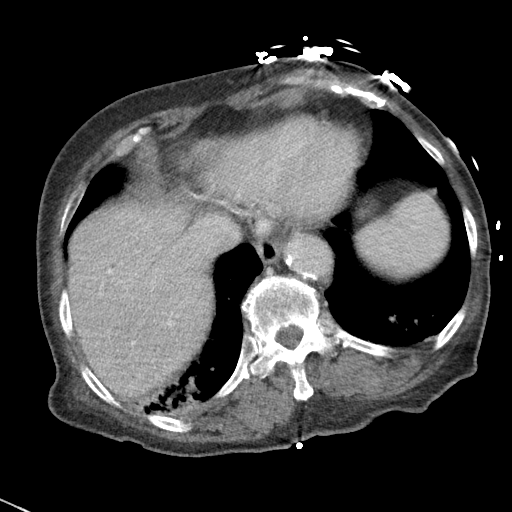
[im 84/89  soft-tissue]
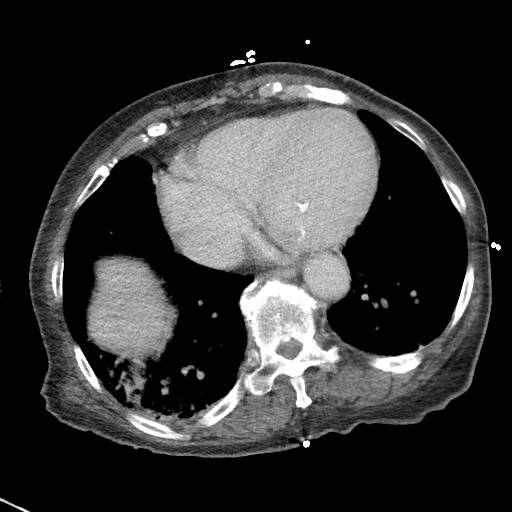

[Series 6: coronal st · coronal · 0.70mm/px · 3 of 81 slices shown]
[im 27/81  soft-tissue]
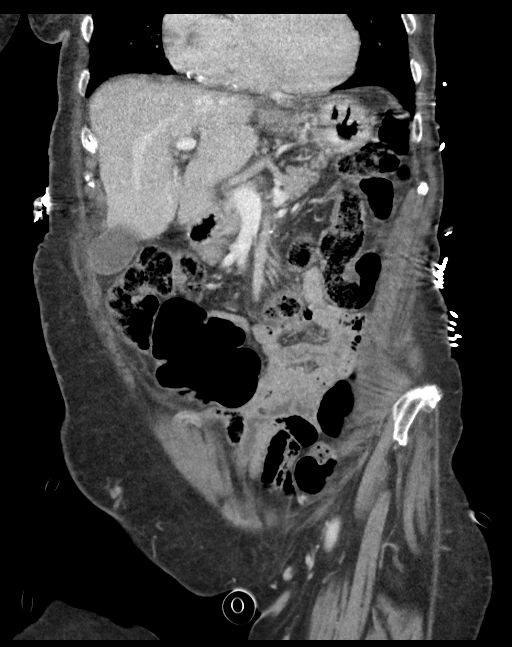
[im 36/81  soft-tissue]
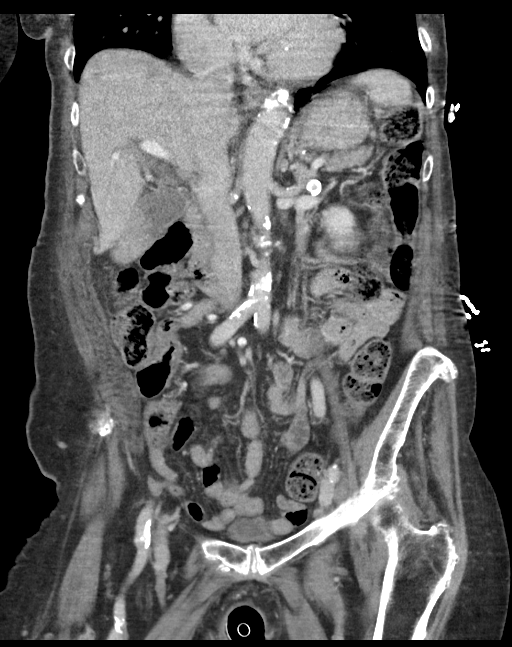
[im 45/81  soft-tissue]
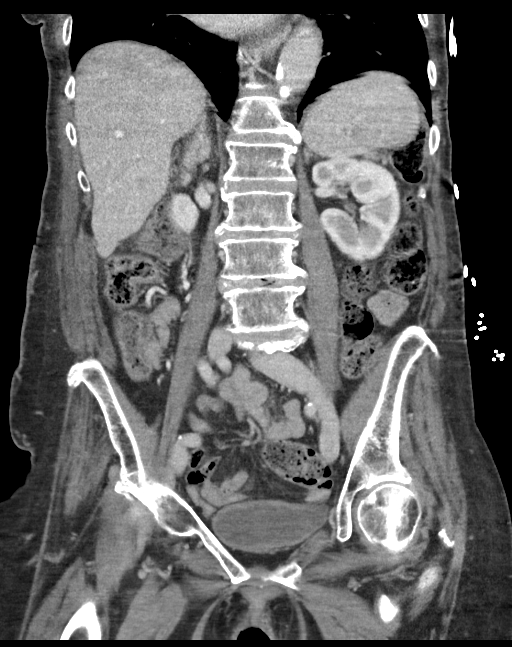

[16 of 46 positions shown; findings below may reference images not displayed]

FINDINGS: Lower chest: Mild right basilar atelectasis. Moderate coronary
artery calcification. Mild cardiomegaly.

Hepatobiliary: Cholelithiasis without pericholecystic inflammatory
change identified. Tiny cyst within the left hepatic lobe. Liver is
otherwise unremarkable. No intra or extrahepatic biliary ductal
dilation.

Pancreas: Unremarkable

Spleen: Unremarkable

Adrenals/Urinary Tract: Adrenal glands are unremarkable. Kidneys are
normal, without renal calculi, focal lesion, or hydronephrosis.
Bladder is unremarkable.

Stomach/Bowel: There is moderate stool seen throughout the colon.
The stomach, small bowel, and large bowel is otherwise unremarkable.
No evidence of obstruction or focal inflammation. The appendix is
not visualized and is likely absent. No free intraperitoneal gas or
fluid.

Vascular/Lymphatic: Moderate atherosclerotic calcification is seen
within the aortoiliac vasculature. Particularly prominent
atherosclerotic calcification is seen at the origin of the celiac
axis and superior mesenteric artery. No pathologic adenopathy within
the abdomen and pelvis.

Reproductive: Status post hysterectomy. No adnexal masses.

Other: Rectum unremarkable.  No abdominal wall hernia.

Musculoskeletal: Osseous structures are age-appropriate. No acute
bone abnormality. No lytic or blastic bone lesion.
IMPRESSION: No acute intra-abdominal pathology. No radiographic explanation for
the patient's reported symptoms.

Cholelithiasis.

Moderate stool throughout the colon without evidence of obstruction.

Peripheral vascular disease. Prominent atherosclerotic calcification
at the origin of the mesenteric vasculature. If there is clinical
evidence of chronic mesenteric ischemia, CT arteriography may be
more helpful for further evaluation.

Aortic Atherosclerosis ([id number]).
